# Patient Record
Sex: Male | Born: 1935 | Race: White | Hispanic: No | Smoking: Former smoker
Health system: Southern US, Community
[De-identification: ages and names within clinical notes are randomized; demographics above are authoritative.]

## PROBLEM LIST (undated history)

## (undated) DIAGNOSIS — E785 Hyperlipidemia, unspecified: Secondary | ICD-10-CM

## (undated) DIAGNOSIS — L980 Pyogenic granuloma: Secondary | ICD-10-CM

## (undated) DIAGNOSIS — H9190 Unspecified hearing loss, unspecified ear: Secondary | ICD-10-CM

## (undated) DIAGNOSIS — R897 Abnormal histological findings in specimens from other organs, systems and tissues: Secondary | ICD-10-CM

## (undated) DIAGNOSIS — Z8042 Family history of malignant neoplasm of prostate: Secondary | ICD-10-CM

## (undated) DIAGNOSIS — G4730 Sleep apnea, unspecified: Secondary | ICD-10-CM

## (undated) DIAGNOSIS — M549 Dorsalgia, unspecified: Secondary | ICD-10-CM

## (undated) DIAGNOSIS — E049 Nontoxic goiter, unspecified: Secondary | ICD-10-CM

## (undated) DIAGNOSIS — N400 Enlarged prostate without lower urinary tract symptoms: Secondary | ICD-10-CM

## (undated) DIAGNOSIS — Z9889 Other specified postprocedural states: Secondary | ICD-10-CM

## (undated) DIAGNOSIS — E669 Obesity, unspecified: Secondary | ICD-10-CM

## (undated) DIAGNOSIS — Z8041 Family history of malignant neoplasm of ovary: Secondary | ICD-10-CM

## (undated) DIAGNOSIS — H269 Unspecified cataract: Secondary | ICD-10-CM

## (undated) DIAGNOSIS — Z801 Family history of malignant neoplasm of trachea, bronchus and lung: Secondary | ICD-10-CM

## (undated) HISTORY — DX: Obesity, unspecified: E66.9

## (undated) HISTORY — DX: Dorsalgia, unspecified: M54.9

## (undated) HISTORY — PX: HERNIA REPAIR: SHX51

## (undated) HISTORY — DX: Family history of malignant neoplasm of ovary: Z80.41

## (undated) HISTORY — PX: VARICOSE VEIN SURGERY: SHX832

## (undated) HISTORY — PX: UMBILICAL HERNIA REPAIR: SHX196

## (undated) HISTORY — DX: Family history of malignant neoplasm of trachea, bronchus and lung: Z80.1

## (undated) HISTORY — DX: Benign prostatic hyperplasia without lower urinary tract symptoms: N40.0

## (undated) HISTORY — DX: Sleep apnea, unspecified: G47.30

## (undated) HISTORY — DX: Pyogenic granuloma: L98.0

## (undated) HISTORY — DX: Hyperlipidemia, unspecified: E78.5

## (undated) HISTORY — DX: Family history of malignant neoplasm of prostate: Z80.42

## (undated) HISTORY — DX: Nontoxic goiter, unspecified: E04.9

## (undated) HISTORY — DX: Abnormal histological findings in specimens from other organs, systems and tissues: R89.7

## (undated) HISTORY — DX: Other specified postprocedural states: Z98.890

## (undated) HISTORY — DX: Unspecified cataract: H26.9

---

## 2005-08-10 ENCOUNTER — Emergency Department (HOSPITAL_COMMUNITY): Admission: EM | Admit: 2005-08-10 | Discharge: 2005-08-10 | Payer: Self-pay | Admitting: Emergency Medicine

## 2005-09-03 DIAGNOSIS — Z9889 Other specified postprocedural states: Secondary | ICD-10-CM

## 2005-09-03 HISTORY — PX: COLONOSCOPY: SHX174

## 2005-09-03 HISTORY — DX: Other specified postprocedural states: Z98.890

## 2007-04-11 ENCOUNTER — Encounter (INDEPENDENT_AMBULATORY_CARE_PROVIDER_SITE_OTHER): Payer: Self-pay | Admitting: Family Medicine

## 2007-07-07 ENCOUNTER — Ambulatory Visit: Payer: Self-pay | Admitting: Family Medicine

## 2007-07-07 DIAGNOSIS — N400 Enlarged prostate without lower urinary tract symptoms: Secondary | ICD-10-CM

## 2007-07-07 DIAGNOSIS — H269 Unspecified cataract: Secondary | ICD-10-CM

## 2007-07-07 DIAGNOSIS — K5900 Constipation, unspecified: Secondary | ICD-10-CM | POA: Insufficient documentation

## 2007-07-08 ENCOUNTER — Encounter (INDEPENDENT_AMBULATORY_CARE_PROVIDER_SITE_OTHER): Payer: Self-pay | Admitting: Family Medicine

## 2007-07-30 ENCOUNTER — Encounter (INDEPENDENT_AMBULATORY_CARE_PROVIDER_SITE_OTHER): Payer: Self-pay | Admitting: Family Medicine

## 2007-08-08 ENCOUNTER — Ambulatory Visit: Payer: Self-pay | Admitting: Family Medicine

## 2007-08-08 ENCOUNTER — Telehealth (INDEPENDENT_AMBULATORY_CARE_PROVIDER_SITE_OTHER): Payer: Self-pay | Admitting: *Deleted

## 2007-08-08 DIAGNOSIS — L821 Other seborrheic keratosis: Secondary | ICD-10-CM

## 2007-08-08 DIAGNOSIS — E669 Obesity, unspecified: Secondary | ICD-10-CM

## 2007-08-09 ENCOUNTER — Encounter (INDEPENDENT_AMBULATORY_CARE_PROVIDER_SITE_OTHER): Payer: Self-pay | Admitting: Family Medicine

## 2007-08-09 LAB — CONVERTED CEMR LAB
Albumin: 4.3 g/dL (ref 3.5–5.2)
BUN: 15 mg/dL (ref 6–23)
CO2: 24 meq/L (ref 19–32)
Calcium: 9 mg/dL (ref 8.4–10.5)
Chloride: 105 meq/L (ref 96–112)
Eosinophils Absolute: 0.2 10*3/uL (ref 0.2–0.7)
Glucose, Bld: 98 mg/dL (ref 70–99)
HDL: 42 mg/dL (ref >39)
Lymphs Abs: 1.7 10*3/uL (ref 0.7–4.0)
MCV: 95.8 fL (ref 78.0–100.0)
Monocytes Relative: 14 % — ABNORMAL HIGH (ref 3–12)
Neutro Abs: 3.3 10*3/uL (ref 1.7–7.7)
Neutrophils Relative %: 53 % (ref 43–77)
Potassium: 5 meq/L (ref 3.5–5.3)
RBC: 4.8 M/uL (ref 4.22–5.81)
Triglycerides: 159 mg/dL — ABNORMAL HIGH (ref <150)
WBC: 6.1 10*3/uL (ref 4.0–10.5)

## 2007-08-11 ENCOUNTER — Telehealth (INDEPENDENT_AMBULATORY_CARE_PROVIDER_SITE_OTHER): Payer: Self-pay | Admitting: *Deleted

## 2007-08-24 ENCOUNTER — Ambulatory Visit: Admission: RE | Admit: 2007-08-24 | Discharge: 2007-08-24 | Payer: Self-pay | Admitting: Family Medicine

## 2007-08-24 ENCOUNTER — Encounter (INDEPENDENT_AMBULATORY_CARE_PROVIDER_SITE_OTHER): Payer: Self-pay | Admitting: Family Medicine

## 2007-09-03 ENCOUNTER — Ambulatory Visit: Payer: Self-pay | Admitting: Pulmonary Disease

## 2007-09-11 ENCOUNTER — Telehealth (INDEPENDENT_AMBULATORY_CARE_PROVIDER_SITE_OTHER): Payer: Self-pay | Admitting: *Deleted

## 2007-09-16 ENCOUNTER — Encounter (INDEPENDENT_AMBULATORY_CARE_PROVIDER_SITE_OTHER): Payer: Self-pay | Admitting: Family Medicine

## 2007-09-18 ENCOUNTER — Ambulatory Visit: Payer: Self-pay | Admitting: Family Medicine

## 2007-09-18 DIAGNOSIS — E785 Hyperlipidemia, unspecified: Secondary | ICD-10-CM

## 2007-09-18 LAB — CONVERTED CEMR LAB: HDL goal, serum: 40 mg/dL

## 2007-10-02 ENCOUNTER — Ambulatory Visit: Payer: Self-pay | Admitting: Family Medicine

## 2007-10-08 ENCOUNTER — Telehealth (INDEPENDENT_AMBULATORY_CARE_PROVIDER_SITE_OTHER): Payer: Self-pay | Admitting: *Deleted

## 2007-10-16 ENCOUNTER — Ambulatory Visit: Payer: Self-pay | Admitting: Family Medicine

## 2008-03-11 ENCOUNTER — Ambulatory Visit: Payer: Self-pay | Admitting: Family Medicine

## 2008-05-20 ENCOUNTER — Ambulatory Visit: Payer: Self-pay | Admitting: Family Medicine

## 2008-05-25 ENCOUNTER — Encounter (INDEPENDENT_AMBULATORY_CARE_PROVIDER_SITE_OTHER): Payer: Self-pay | Admitting: Family Medicine

## 2008-06-03 ENCOUNTER — Ambulatory Visit: Payer: Self-pay | Admitting: Family Medicine

## 2008-08-12 ENCOUNTER — Ambulatory Visit: Payer: Self-pay | Admitting: Family Medicine

## 2008-08-12 LAB — CONVERTED CEMR LAB

## 2008-08-13 LAB — CONVERTED CEMR LAB
ALT: 16 units/L (ref 0–53)
AST: 21 units/L (ref 0–37)
Albumin: 4.1 g/dL (ref 3.5–5.2)
Alkaline Phosphatase: 54 units/L (ref 39–117)
Calcium: 8.6 mg/dL (ref 8.4–10.5)
Chloride: 108 meq/L (ref 96–112)
Creatinine, Ser: 0.81 mg/dL (ref 0.40–1.50)
LDL Cholesterol: 82 mg/dL (ref 0–99)
Potassium: 4.6 meq/L (ref 3.5–5.3)
Total CHOL/HDL Ratio: 3.6

## 2008-11-03 ENCOUNTER — Encounter (INDEPENDENT_AMBULATORY_CARE_PROVIDER_SITE_OTHER): Payer: Self-pay | Admitting: Family Medicine

## 2009-02-07 ENCOUNTER — Ambulatory Visit: Payer: Self-pay | Admitting: Family Medicine

## 2009-02-07 DIAGNOSIS — D239 Other benign neoplasm of skin, unspecified: Secondary | ICD-10-CM | POA: Insufficient documentation

## 2009-02-07 DIAGNOSIS — H6690 Otitis media, unspecified, unspecified ear: Secondary | ICD-10-CM | POA: Insufficient documentation

## 2009-02-07 LAB — CONVERTED CEMR LAB
Cholesterol, target level: 200 mg/dL
LDL Goal: 130 mg/dL

## 2009-02-09 ENCOUNTER — Encounter (INDEPENDENT_AMBULATORY_CARE_PROVIDER_SITE_OTHER): Payer: Self-pay | Admitting: Family Medicine

## 2010-02-01 ENCOUNTER — Ambulatory Visit (HOSPITAL_COMMUNITY): Admission: RE | Admit: 2010-02-01 | Discharge: 2010-02-01 | Payer: Self-pay | Admitting: Internal Medicine

## 2010-02-01 DIAGNOSIS — Z9889 Other specified postprocedural states: Secondary | ICD-10-CM

## 2010-02-01 HISTORY — PX: COLONOSCOPY: SHX174

## 2010-02-01 HISTORY — DX: Other specified postprocedural states: Z98.890

## 2010-02-03 ENCOUNTER — Emergency Department (HOSPITAL_COMMUNITY): Admission: EM | Admit: 2010-02-03 | Discharge: 2010-02-03 | Payer: Self-pay | Admitting: Emergency Medicine

## 2010-02-15 ENCOUNTER — Ambulatory Visit: Payer: Self-pay | Admitting: Gastroenterology

## 2010-02-15 DIAGNOSIS — K921 Melena: Secondary | ICD-10-CM

## 2010-02-16 ENCOUNTER — Encounter: Payer: Self-pay | Admitting: Gastroenterology

## 2010-02-21 ENCOUNTER — Ambulatory Visit: Payer: Self-pay | Admitting: Gastroenterology

## 2010-02-21 ENCOUNTER — Ambulatory Visit (HOSPITAL_COMMUNITY): Admission: RE | Admit: 2010-02-21 | Discharge: 2010-02-21 | Payer: Self-pay | Admitting: Gastroenterology

## 2010-02-23 ENCOUNTER — Telehealth: Payer: Self-pay | Admitting: Gastroenterology

## 2010-05-17 ENCOUNTER — Encounter: Payer: Self-pay | Admitting: Gastroenterology

## 2010-05-25 ENCOUNTER — Ambulatory Visit: Payer: Self-pay | Admitting: Gastroenterology

## 2010-05-25 DIAGNOSIS — K2270 Barrett's esophagus without dysplasia: Secondary | ICD-10-CM

## 2010-05-31 ENCOUNTER — Encounter (INDEPENDENT_AMBULATORY_CARE_PROVIDER_SITE_OTHER): Payer: Self-pay

## 2010-05-31 LAB — CONVERTED CEMR LAB
Basophils Absolute: 0 10*3/uL (ref 0.0–0.1)
Basophils Relative: 0 % (ref 0–1)
Eosinophils Absolute: 0.3 10*3/uL (ref 0.0–0.7)
MCHC: 33.8 g/dL (ref 30.0–36.0)
Monocytes Relative: 12 % (ref 3–12)
Neutro Abs: 3.9 10*3/uL (ref 1.7–7.7)
Neutrophils Relative %: 53 % (ref 43–77)
Platelets: 184 10*3/uL (ref 150–400)
RBC: 4.65 M/uL (ref 4.22–5.81)

## 2010-10-03 NOTE — Assessment & Plan Note (Signed)
Summary: BRBPR   Visit Type:  Follow-up Visit Primary Care Provider:  Dwana Melena, M.D.   Chief Complaint:  rectal bleeding.  History of Present Illness: No rectal bleeding. Dr. Rito Ehrlich checked PSA. No other blood draws. No belly pain or black stool. Pt using [facility]. Needs [facility]MP rx faxed.  Current Medications (verified): 1)  Multivitamins  Caps (Multiple Vitamin) .... One Daily 2)  Finasteride 5 Mg Tabs (Finasteride) .... Once Daily 3)  Doxazosin Mesylate 4 Mg Tabs (Doxazosin Mesylate) .... Once Daily 4)  Prilosec 20 Mg Cpdr (Omeprazole) .Marland Kitchen.. 1 By Mouth Every Morning  Allergies (verified): 1)  ! Penicillin  Past History:  Past Surgical History: Last updated: 02/15/2010 1. Hernia repair / 1983 - left groin 2. Varicose veins , 1950s 3. Umbilical hernia repair  Past Medical History: **TCS/EGD JUNE 2011- Rectal bleeding most likely secondary to hemorrhoids Barrett esophagus. Mild gastritis and duodenitis. 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 Scharlene Gloss note, done at [facility]  Vital Signs:  Patient profile:   77 year old male Height:      70 inches Weight:      228 pounds BMI:     32.83 Temp:     97.5 degrees F oral Pulse rate:   68 / minute BP sitting:   150 / 80  (left arm) Cuff size:   regular  Vitals Entered By: Cloria Spring LPN (May 25, 2010 8:58 AM)  Physical Exam  General:  Well developed, well nourished, no acute distress. Head:  Normocephalic and atraumatic. Eyes:  PERRL, no icterus. Mouth:  No deformity or lesions. Neck:  Supple; no masses. Lungs:  Clear throughout to  auscultation. Heart:  Regular rate and rhythm; no murmurs. Abdomen:  Soft, nontender and nondistended.Normal bowel sounds.  Impression & Recommendations:  Problem # 1:  BLOOD IN STOOL (ICD-578.1) Assessment Improved  Sx improved. Recheck CBC. OPV in 12 mos.  CC: PCP  Orders: T-CBC w/Diff ([id number])  Problem # 2:  BARRETTS ESOPHAGUS (ICD-530.85) Assessment: Comment Only EGD IN 2012 Prescriptions: PRILOSEC 20 MG CPDR (OMEPRAZOLE) 1 by mouth every morning  #90 x 3   Entered and Authorized by:   West Bali MD   Signed by:   West Bali MD on 05/25/2010   Method used:   Printed then faxed to ...       [facility] [address] (retail)       [address]       [place], [place]  [address]       Ph: [phone]       Fax:    RxID:   [id number]   Appended Document: BRBPR F/U OPV IS IN THE COMPUTER  Appended Document: Orders Update    Clinical Lists Changes  Orders: Added new Service order of Est. Patient Level II ([address]) - Signed

## 2010-10-03 NOTE — Letter (Signed)
Summary: Normal Results Letter  [facility]  [address]   [place], [place] [address]   Phone: [phone]  Fax: [phone]    May 31, 2010  Jerry Macdonald [address] [place], [place]  [address] Sep 23, 1930   Dear [name],   Our office has been trying to contact you.  We just wanted to inform you that  your blood count was normal. Please call if you have questions, [phone].   Thank you,    Hendricks Limes, LPN Cloria Spring, LPN  [facility] Ph: [phone]   Fax: [phone]

## 2010-10-03 NOTE — Letter (Signed)
Summary: [facility]   Imported By: Rexene Alberts 05/17/2010 11:11:36  _____________________________________________________________________  External Attachment:    Type:   Image     Comment:   External Document

## 2010-10-03 NOTE — Letter (Signed)
Summary: PCP REFERRAL  PCP REFERRAL   Imported By: Ave Filter 02/16/2010 13:54:00  _____________________________________________________________________  External Attachment:    Type:   Image     Comment:   External Document

## 2010-10-03 NOTE — Progress Notes (Signed)
Summary: Prilosec       New/Updated Medications: PRILOSEC 20 MG CPDR (OMEPRAZOLE) 1 by mouth every morning Prescriptions: PRILOSEC 20 MG CPDR (OMEPRAZOLE) 1 by mouth every morning  #30 x 11   Entered and Authorized by:   West Bali MD   Signed by:   West Bali MD on 02/23/2010   Method used:   Telephoned to ...       [facility] [address] (retail)       [address]       [place], [place]  [address]       Ph: [phone]       Fax:    RxID:   [id number]   Appended Document: Prilosec rx called in

## 2010-10-03 NOTE — Letter (Signed)
Summary: TCS/EGD ORDER  TCS/EGD ORDER   Imported By: Ave Filter 02/15/2010 14:24:14  _____________________________________________________________________  External Attachment:    Type:   Image     Comment:   External Document

## 2010-10-03 NOTE — Assessment & Plan Note (Signed)
Summary: GI BLEED/SS   Visit Type:  Consult Referring Provider:  Dwana Melena Primary Care Provider:  Dwana Melena  Chief Complaint:  GI bleeding.  History of Present Illness: [name] is a pleasant 77 y/o WM, patient of Dr. Dwana Melena, who presents for further evaluation of GI bleeding. He first noted blood in his stool on June 2nd. Initially he had couple of stools which were maroon in color. He went to ED on  June 3rd,  stool dark red at that time. Lasted couple of days, each day less blood. Took 9 pills of Celebrex and had finished around time of bleeding started.  Also took Medrol dose pak. Stopped his Asa 81mg  on June 6th. He denies abd pain. No constipation or diarrhea. No n/v, heartburn, dysphagia, weight loss.  ED 02/03/10: WBC 13,100, H/H 13.9/40.2, PT/INR normal. DRE-->maroon stool. Labs 02/06/10: H/H 13.4/40.5, WBC 9800.  Current Medications (verified): 1)  Multivitamins  Caps (Multiple Vitamin) .... One Daily 2)  Omeprazole 20 Mg Cpdr (Omeprazole) .... Once Daily 3)  Finasteride 5 Mg Tabs (Finasteride) .... Once Daily 4)  Doxazosin Mesylate 4 Mg Tabs (Doxazosin Mesylate) .... Once Daily  Allergies (verified): 1)  ! 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 Scharlene Gloss note, done at [facility]  Past Surgical History: 1. Hernia repair / 1983 - left groin 2. Varicose veins , 1950s 3. Umbilical hernia repair  Family History: Father: deceased, 84, prostate ca Mother: deceased, 68, train wreck Brothers x 1: 84- Healthy except for smoker, Half brother age 34 - ? 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 Eyes:  Denies vision loss. ENT:  Denies nasal congestion, loss of smell, and difficulty swallowing. CV:  Denies chest pains, angina, palpitations, dyspnea on exertion, and peripheral edema. Resp:  Denies dyspnea at rest, dyspnea with exercise, cough, and sputum. GI:  See HPI. GU:  Denies urinary burning and blood in urine. MS:  Complains of joint pain / LOM and low back pain. Derm:  Denies rash and itching. Neuro:  Denies weakness, frequent headaches, memory loss, and confusion. Psych:  Denies depression and anxiety. Endo:  Denies unusual weight change. Heme:  Denies bruising and bleeding. Allergy:  Denies hives and rash.  Vital Signs:  Patient profile:   77 year old male Height:      70 inches Weight:      223 pounds BMI:     32.11 Temp:     97.9 degrees F oral Pulse rate:   68 / minute BP sitting:   118 / 82  (left arm) Cuff size:   regular  Vitals Entered By: Hendricks Limes LPN (February 15, 2010 11:28 AM)  Physical Exam  General:  Well developed, well nourished, no acute distress.obese.   Head:  Normocephalic and atraumatic. Eyes:  Conjunctivae pink, no scleral icterus.  Mouth:  Oropharyngeal mucosa moist, pink.  No lesions, erythema or  exudate.    Neck:  Supple; no masses or thyromegaly. Lungs:  Clear throughout to auscultation. Heart:  Regular rate and rhythm; no murmurs, rubs,  or bruits. Abdomen:  normal bowel sounds, obese, without guarding, without rebound, no hernia, no distesion, no tenderness, no masses, and no hepatomegally or splenomegaly.   Rectal:  deferred until time of colonoscopy.   Extremities:  No clubbing, cyanosis, edema  or deformities noted. Neurologic:  Alert and  oriented x4;  grossly normal neurologically. Skin:  Intact without significant lesions or rashes. Cervical Nodes:  No significant cervical adenopathy. Psych:  Alert and cooperative. Normal mood and affect.  Impression & Recommendations:  Problem # 1:  BLOOD IN STOOL (ICD-578.1)  Recent maroon stools, now resolved. In setting of NSAID/ASA use as outlined above. H/O diverticulosis. He may have developed diverticular bleed. Cannot exclude AVMs, malignancy, colitis. Doubt he had rapid transit upper gi bleed given hemodynamic stability.  Colonoscopy+/-EGD to be performed in near future.  Risks, alternatives, and benefits including but not limited to the risk of reaction to medication, bleeding, infection, and perforation were addressed.  Patient voiced understanding and provided verbal consent.   Continue PPI. Hold ASA for now. He should call if any further bleeding noted.   Orders: Consultation Level IV ([address]) I would like to thank Dr. Margo Aye for allowing [place] to take part in the care of this nice patient.

## 2010-11-20 LAB — URINALYSIS, ROUTINE W REFLEX MICROSCOPIC
Hgb urine dipstick: NEGATIVE
Nitrite: NEGATIVE
Protein, ur: NEGATIVE mg/dL
Specific Gravity, Urine: 1.02 (ref 1.005–1.030)
Urobilinogen, UA: 0.2 mg/dL (ref 0.0–1.0)

## 2010-11-20 LAB — DIFFERENTIAL
Basophils Absolute: 0 10*3/uL (ref 0.0–0.1)
Eosinophils Absolute: 0.1 10*3/uL (ref 0.0–0.7)
Eosinophils Relative: 1 % (ref 0–5)
Lymphocytes Relative: 15 % (ref 12–46)
Lymphs Abs: 2 10*3/uL (ref 0.7–4.0)
Monocytes Absolute: 1.3 10*3/uL — ABNORMAL HIGH (ref 0.1–1.0)

## 2010-11-20 LAB — COMPREHENSIVE METABOLIC PANEL
ALT: 22 U/L (ref 0–53)
AST: 22 U/L (ref 0–37)
Albumin: 3.5 g/dL (ref 3.5–5.2)
CO2: 28 mEq/L (ref 19–32)
Chloride: 102 mEq/L (ref 96–112)
GFR calc Af Amer: >60 mL/min (ref >60)
GFR calc non Af Amer: >60 mL/min (ref >60)
Potassium: 4.3 mEq/L (ref 3.5–5.1)
Sodium: 135 mEq/L (ref 135–145)
Total Bilirubin: 1.4 mg/dL — ABNORMAL HIGH (ref 0.3–1.2)

## 2010-11-20 LAB — CBC
Platelets: 226 10*3/uL (ref 150–400)
RBC: 4.17 MIL/uL — ABNORMAL LOW (ref 4.22–5.81)
WBC: 13.1 10*3/uL — ABNORMAL HIGH (ref 4.0–10.5)

## 2010-11-20 LAB — PROTIME-INR: Prothrombin Time: 13.8 seconds (ref 11.6–15.2)

## 2011-01-16 NOTE — Procedures (Signed)
NAMEBRYSON, Jerry Macdonald              ACCOUNT NO.:  [id number]   MEDICAL RECORD NO.:  [id number]          PATIENT TYPE:  OUT   LOCATION:  SLEEP LAB                     FACILITY:  [facility]   PHYSICIAN:  Barbaraann Share, MD,FCCPDATE OF BIRTH:  1933/02/04   DATE OF STUDY:  08/24/2007                            NOCTURNAL POLYSOMNOGRAM   REFERRING PHYSICIAN:  Franchot Heidelberg, M.D.   INDICATIONS FOR STUDY:  Hypersomnia with sleep apnea.   EPWORTH SLEEPINESS SCORE:  Four.   SLEEP ARCHITECTURE:  The patient had a total sleep time during the  entire night of 95 minutes with 91 events coming in the diagnostic  portion of the study and only 4 minutes spent sleeping during the  titration.  Sleep onset latency was normal as was REM onset.  Sleep  efficiency was very poor at less than 50%.  There is very little REM or  slow wave sleep during the entire night.   RESPIRATORY DATA:  The patient was found to have 56 obstructive events  in the first 91 minutes of sleep.  This gave him an apnea/hypopnea index  of 37 per hour during the diagnostic portion of the study.  Events were  not positional but clearly worse during REM.  There was very loud  snoring noted throughout.  By protocol the patient was then placed on a  CPAP device with a medium Respironics ComfortGel mask and was started on  5 cm of water pressure.  Unfortunately, the patient only slept for 4  minutes during the entire rest of the night, and therefore optimal CPAP  titration could not be reached.   OXYGEN DATA:  The patient had O2 desaturation as low as 72% with his  obstructive events.   CARDIAC DATA:  No clinically significant arrhythmias were noted  movement.   MOVEMENTS/PARASOMNIA:  None.   IMPRESSIONS/RECOMMENDATIONS:  Moderate to severe obstructive sleep apnea  with an apnea/hypopnea index of 37 events per hour and oxygen 2  desaturation as low as 72% during the diagnostic portion of the study.  The patient was then placed  on cpap by protocol with a medium  Respironics ComfortGel mask at 5 cm, however, he was unable to get any  more than 4 minutes of sleep the entire rest of the night.  Therefore,  he could not be optimized with regard to continuous positive airway  pressure.  Treatment for this degree of sleep apnea should focus  primarily  on weight loss as well as continuous positive airway pressure.  The  patient can return for formal continuous positive airway pressure  titration if this modality of treatment is chosen.      Barbaraann Share, MD,FCCP  Diplomate, American Board of Sleep  Medicine  Electronically Signed     [facility]  D:  09/05/2007 15:29:19  T:  09/05/2007 17:32:23  Job:  [id number]

## 2011-05-17 ENCOUNTER — Encounter: Payer: Self-pay | Admitting: Gastroenterology

## 2011-05-23 ENCOUNTER — Ambulatory Visit (INDEPENDENT_AMBULATORY_CARE_PROVIDER_SITE_OTHER): Payer: Medicare Other | Admitting: Gastroenterology

## 2011-05-23 ENCOUNTER — Encounter: Payer: Self-pay | Admitting: Gastroenterology

## 2011-05-23 DIAGNOSIS — K2270 Barrett's esophagus without dysplasia: Secondary | ICD-10-CM

## 2011-05-23 NOTE — Patient Instructions (Signed)
We have set you up for an endoscopy with Dr. Darrick Penna to see how your esophagus is doing due to your history of Barrett's.   Continue Prilosec daily!

## 2011-05-27 ENCOUNTER — Encounter: Payer: Self-pay | Admitting: Gastroenterology

## 2011-05-27 NOTE — Assessment & Plan Note (Signed)
77 year old male due for surveillance endoscopy with Dr. Darrick Penna. Last done in June 2011. Doing well without dysphagia, odynophagia, breakthrough reflux. Denies melena. Prilosec daily.   Proceed with upper endoscopy in the near future with Dr. Darrick Penna. The risks, benefits, and alternatives have been discussed in detail with patient. They have stated understanding and desire to proceed.  Continue Prilosec daily

## 2011-05-27 NOTE — Progress Notes (Signed)
Referring Provider: No ref. provider found Primary Care Physician:  Dwana Melena, MD Primary Gastroenterologist: Dr. Darrick Penna   Chief Complaint  Patient presents with  . Follow-up    HPI:   Mr. Stalvey is a pleasant 77 year old male with a history of Barrett's esophagus, due for updated surveillance this year. He reports reflux is controlled with Prilosec. Denies any dysphagia or odynophagia. Denies abdominal pain or rectal bleeding. Last EGD was in June 2011 with Dr. Darrick Penna. He has no complaints today. Wt up 2 lbs.   Past Medical History  Diagnosis Date  . Sleep apnea   . Pyogenic granuloma     left cheeck  . Back pain     with radiculopathy  . Hyperlipidemia   . Seborrheic keratosis   . Obesity   . Malaise and fatigue   . Cataracts, bilateral   . Constipation   . Hypertrophy of prostate   . S/P endoscopy June 2011    Barrett's, mild gastritis and duodenitis  . S/P colonoscopy June 2011    rectal bleeding secondary to hemorrhoids  . S/P colonoscopy 2007    [facility], diverticulosis per Dr. Scharlene Gloss note    Past Surgical History  Procedure Date  . Hernia repair   . Varicose vein surgery   . Umbilical hernia repair     Current Outpatient Prescriptions  Medication Sig Dispense Refill  . aspirin 81 MG tablet Take 81 mg by mouth daily.        Marland Kitchen doxazosin (CARDURA) 4 MG tablet Take 4 mg by mouth at bedtime.        . finasteride (PROSCAR) 5 MG tablet Take 5 mg by mouth daily.        . Multiple Vitamin (MULTIVITAMIN) capsule Take 1 capsule by mouth daily.        Marland Kitchen omeprazole (PRILOSEC) 20 MG capsule Take 20 mg by mouth daily.          Allergies as of 05/23/2011 - Review Complete 05/23/2011  Allergen Reaction Noted  . Penicillins  07/07/2007    No family history on file.  History   Social History  . Marital Status: Divorced    Spouse Name: N/A    Number of Children: N/A  . Years of Education: N/A   Social History Main Topics  . Smoking status: Former Smoker -- 0.5  packs/day    Types: Cigarettes  . Smokeless tobacco: None   Comment: quit in 1965  . Alcohol Use: No  . Drug Use: No  . Sexually Active: None   Other Topics Concern  . None   Social History Narrative  . None    Review of Systems: Gen: Denies fever, chills, anorexia. Denies fatigue, weakness, weight loss.  CV: Denies chest pain, palpitations, syncope, peripheral edema, and claudication. Resp: Denies dyspnea at rest, cough, wheezing, coughing up blood, and pleurisy. GI: Denies vomiting blood, jaundice, and fecal incontinence.   Denies dysphagia or odynophagia. Derm: Denies rash, itching, dry skin Psych: Denies depression, anxiety, memory loss, confusion. No homicidal or suicidal ideation.  Heme: Denies bruising, bleeding, and enlarged lymph nodes.  Physical Exam: BP 136/68  Pulse 79  Temp(Src) 98.2 F (36.8 C) (Temporal)  Ht 5\' 11"  (1.803 m)  Wt 230 lb 12.8 oz (104.69 kg)  BMI 32.19 kg/m2 General:   Alert and oriented. No distress noted. Pleasant and cooperative. Obese.  Head:  Normocephalic and atraumatic. Eyes:  Conjuctiva clear without scleral icterus. Mouth:  Oral mucosa pink and moist. No lesions. Neck:  Supple, without mass or thyromegaly. Heart:  S1, S2 present without murmurs, rubs, or gallops. Regular rate and rhythm. Abdomen:  +BS, soft, largely obese, protuberant. non-tender and non-distended. No rebound or guarding. No HSM or masses noted. Msk:  Symmetrical without gross deformities. Normal posture. Extremities:  Without edema. Neurologic:  Alert and  oriented x4;  grossly normal neurologically. Skin:  Intact without significant lesions or rashes. Cervical Nodes:  No significant cervical adenopathy. Psych:  Alert and cooperative. Normal mood and affect.

## 2011-05-28 NOTE — Progress Notes (Signed)
Cc to PCP 

## 2011-06-07 ENCOUNTER — Telehealth: Payer: Self-pay

## 2011-06-07 NOTE — Telephone Encounter (Signed)
Pt came by office, he needs refill on omperazole. He uses [facility] order[facility]. Needs #90.

## 2011-06-08 ENCOUNTER — Telehealth: Payer: Self-pay | Admitting: Gastroenterology

## 2011-06-08 MED ORDER — SODIUM CHLORIDE 0.45 % IV SOLN: Freq: Once | INTRAVENOUS | Status: DC

## 2011-06-08 MED ORDER — OMEPRAZOLE 20 MG PO CPDR: 20.0000 mg | DELAYED_RELEASE_CAPSULE | Freq: Every day | ORAL | Status: DC

## 2011-06-08 NOTE — Telephone Encounter (Signed)
Pt time moved due to case being added in the OR- I left message on pts answering machine advising him of the new arrival and procedure time

## 2011-06-11 ENCOUNTER — Encounter (HOSPITAL_COMMUNITY): Payer: Self-pay | Admitting: *Deleted

## 2011-06-11 ENCOUNTER — Encounter (HOSPITAL_COMMUNITY)
Admission: RE | Disposition: A | Discharge status: Home / Self Care | Payer: Self-pay | Source: Ambulatory Visit | Attending: Gastroenterology

## 2011-06-11 ENCOUNTER — Other Ambulatory Visit: Payer: Self-pay | Admitting: Gastroenterology

## 2011-06-11 ENCOUNTER — Ambulatory Visit (HOSPITAL_COMMUNITY)
Admission: RE | Admit: 2011-06-11 | Discharge: 2011-06-11 | Disposition: A | Discharge status: Home / Self Care | Payer: Medicare Other | Source: Ambulatory Visit | Attending: Gastroenterology | Admitting: Gastroenterology

## 2011-06-11 DIAGNOSIS — E785 Hyperlipidemia, unspecified: Secondary | ICD-10-CM | POA: Insufficient documentation

## 2011-06-11 DIAGNOSIS — K2990 Gastroduodenitis, unspecified, without bleeding: Secondary | ICD-10-CM

## 2011-06-11 DIAGNOSIS — K2900 Acute gastritis without bleeding: Secondary | ICD-10-CM | POA: Insufficient documentation

## 2011-06-11 DIAGNOSIS — K2980 Duodenitis without bleeding: Secondary | ICD-10-CM | POA: Insufficient documentation

## 2011-06-11 DIAGNOSIS — Z7982 Long term (current) use of aspirin: Secondary | ICD-10-CM | POA: Insufficient documentation

## 2011-06-11 DIAGNOSIS — K2270 Barrett's esophagus without dysplasia: Secondary | ICD-10-CM | POA: Insufficient documentation

## 2011-06-11 DIAGNOSIS — K2970 Gastritis, unspecified, without bleeding: Secondary | ICD-10-CM

## 2011-06-11 HISTORY — PX: ESOPHAGOGASTRODUODENOSCOPY: SHX5428

## 2011-06-11 SURGERY — EGD (ESOPHAGOGASTRODUODENOSCOPY)
Anesthesia: Moderate Sedation

## 2011-06-11 MED ORDER — MIDAZOLAM HCL 5 MG/5ML IJ SOLN
INTRAMUSCULAR | Status: DC | PRN
  Administered 2011-06-11 (×2): 2 mg via INTRAVENOUS

## 2011-06-11 MED ORDER — BUTAMBEN-TETRACAINE-BENZOCAINE 2-2-14 % EX AERO
INHALATION_SPRAY | CUTANEOUS | Status: DC | PRN
  Administered 2011-06-11: 1 via TOPICAL

## 2011-06-11 MED ORDER — FLUMAZENIL 0.5 MG/5ML IV SOLN
INTRAVENOUS | Status: AC
  Filled 2011-06-11: qty 5

## 2011-06-11 MED ORDER — MEPERIDINE HCL 100 MG/ML IJ SOLN
INTRAMUSCULAR | Status: AC
  Filled 2011-06-11: qty 2

## 2011-06-11 MED ORDER — MEPERIDINE HCL 100 MG/ML IJ SOLN
INTRAMUSCULAR | Status: DC | PRN
  Administered 2011-06-11 (×2): 25 mg via INTRAVENOUS

## 2011-06-11 MED ORDER — MIDAZOLAM HCL 5 MG/5ML IJ SOLN
INTRAMUSCULAR | Status: AC
  Filled 2011-06-11: qty 10

## 2011-06-11 MED ORDER — FLUMAZENIL 0.5 MG/5ML IV SOLN
INTRAVENOUS | Status: DC | PRN
  Administered 2011-06-11 (×2): .25 mg via INTRAVENOUS

## 2011-06-11 NOTE — Progress Notes (Signed)
REVIEWED. AGREE. 

## 2011-06-11 NOTE — Interval H&P Note (Signed)
History and Physical Interval Note:   06/11/2011   12:36 PM   Jerry Macdonald  has presented today for surgery, with the diagnosis of Barretts esophagus  The various methods of treatment have been discussed with the patient and family. After consideration of risks, benefits and other options for treatment, the patient has consented to  Procedure(s): ESOPHAGOGASTRODUODENOSCOPY (EGD) as a surgical intervention .  I have reviewed the patients' chart and labs.  Questions were answered to the patient's satisfaction.     [name]  MD       THE PATIENT WAS EXAMINED AND THERE IS NO CHANGE IN THE PATIENT'S CONDITION SINCE THE ORIGINAL H&P WAS COMPLETED.

## 2011-06-11 NOTE — H&P (Signed)
530.85      Reason for Visit     Follow-up        Vitals - Last Recorded       BP Pulse Temp(Src) Ht Wt BMI    136/68  79  98.2 F (36.8 C) (Temporal)  5\' 11"  (1.803 m)  230 lb 12.8 oz (104.69 kg)  32.19 kg/m2          Progress Notes     Jerry Halls, NP  05/27/2011 10:00 PM  Signed Referring Provider: No ref. provider found Primary Care Physician:  Dwana Melena, MD Primary Gastroenterologist: Dr. Darrick Penna     Chief Complaint   Patient presents with   .  Follow-up      HPI:    Jerry Macdonald is a pleasant 77 year old male with a history of Barrett's esophagus, due for updated surveillance this year. He reports reflux is controlled with Prilosec. Denies any dysphagia or odynophagia. Denies abdominal pain or rectal bleeding. Last EGD was in June 2011 with Dr. Darrick Penna. He has no complaints today. Wt up 2 lbs.     Past Medical History   Diagnosis  Date   .  Sleep apnea     .  Pyogenic granuloma         left cheeck   .  Back pain         with radiculopathy   .  Hyperlipidemia     .  Seborrheic keratosis     .  Obesity     .  Malaise and fatigue     .  Cataracts, bilateral     .  Constipation     .  Hypertrophy of prostate     .  S/P endoscopy  June 2011       Barrett's, mild gastritis and duodenitis   .  S/P colonoscopy  June 2011       rectal bleeding secondary to hemorrhoids   .  S/P colonoscopy  2007       [facility], diverticulosis per Dr. Scharlene Gloss note       Past Surgical History   Procedure  Date   .  Hernia repair     .  Varicose vein surgery     .  Umbilical hernia repair         Current Outpatient Prescriptions   Medication  Sig  Dispense  Refill   .  aspirin 81 MG tablet  Take 81 mg by mouth daily.           Marland Kitchen  doxazosin (CARDURA) 4 MG tablet  Take 4 mg by mouth at bedtime.           .  finasteride (PROSCAR) 5 MG tablet  Take 5 mg by mouth daily.           .  Multiple Vitamin (MULTIVITAMIN) capsule  Take 1 capsule by mouth daily.           Marland Kitchen  omeprazole  (PRILOSEC) 20 MG capsule  Take 20 mg by mouth daily.               Allergies as of 05/23/2011 - Review Complete 05/23/2011   Allergen  Reaction  Noted   .  Penicillins    07/07/2007      No family history on file.    History       Social History   .  Marital Status:  Divorced       Spouse Name:  N/A       Number of Children:  N/A   .  Years of Education:  N/A       Social History Main Topics   .  Smoking status:  Former Smoker -- 0.5 packs/day       Types:  Cigarettes   .  Smokeless tobacco:  None     Comment: quit in 1965   .  Alcohol Use:  No   .  Drug Use:  No   .  Sexually Active:  None       Other Topics  Concern   .  None       Social History Narrative   .  None      Review of Systems: Gen: Denies fever, chills, anorexia. Denies fatigue, weakness, weight loss.   CV: Denies chest pain, palpitations, syncope, peripheral edema, and claudication. Resp: Denies dyspnea at rest, cough, wheezing, coughing up blood, and pleurisy. GI: Denies vomiting blood, jaundice, and fecal incontinence.   Denies dysphagia or odynophagia. Derm: Denies rash, itching, dry skin Psych: Denies depression, anxiety, memory loss, confusion. No homicidal or suicidal ideation.   Heme: Denies bruising, bleeding, and enlarged lymph nodes.   Physical Exam: BP 136/68  Pulse 79  Temp(Src) 98.2 F (36.8 C) (Temporal)  Ht 5\' 11"  (1.803 m)  Wt 230 lb 12.8 oz (104.69 kg)  BMI 32.19 kg/m2 General:   Alert and oriented. No distress noted. Pleasant and cooperative. Obese.   Head:  Normocephalic and atraumatic. Eyes:  Conjuctiva clear without scleral icterus. Mouth:  Oral mucosa pink and moist. No lesions. Neck:  Supple, without mass or thyromegaly. Heart:  S1, S2 present without murmurs, rubs, or gallops. Regular rate and rhythm. Abdomen:  +BS, soft, largely obese, protuberant. non-tender and non-distended. No rebound or guarding. No HSM or masses noted. Msk:  Symmetrical without gross  deformities. Normal posture. Extremities:  Without edema. Neurologic:  Alert and  oriented x4;  grossly normal neurologically. Skin:  Intact without significant lesions or rashes. Cervical Nodes:  No significant cervical adenopathy. Psych:  Alert and cooperative. Normal mood and affect.     Glendora Score  05/28/2011  3:46 PM  Signed Cc to PCP        BARRETTS ESOPHAGUS - Jerry Halls, NP  05/27/2011  9:59 PM  Signed 77 year old male due for surveillance endoscopy with Dr. Darrick Penna. Last done in June 2011. Doing well without dysphagia, odynophagia, breakthrough reflux. Denies melena. Prilosec daily.    Proceed with upper endoscopy in the near future with Dr. Darrick Penna. The risks, benefits, and alternatives have been discussed in detail with patient. They have stated understanding and desire to proceed.  Continue Prilosec daily

## 2011-06-15 ENCOUNTER — Telehealth: Payer: Self-pay | Admitting: Gastroenterology

## 2011-06-15 NOTE — Telephone Encounter (Signed)
Leonides Grills (friend) was calling for Pt. She said he was having some lower gi bleeding and wanted to speak with SF or nurse about this since he had a procedure recently. Please call pt at [phone]

## 2011-06-15 NOTE — Telephone Encounter (Signed)
Called pt. Got VM. LMOM if he is having bleeding to go to the ED.

## 2011-06-18 ENCOUNTER — Telehealth: Payer: Self-pay | Admitting: Gastroenterology

## 2011-06-18 ENCOUNTER — Other Ambulatory Visit: Payer: Self-pay | Admitting: Gastroenterology

## 2011-06-18 ENCOUNTER — Other Ambulatory Visit: Payer: Self-pay

## 2011-06-18 DIAGNOSIS — K625 Hemorrhage of anus and rectum: Secondary | ICD-10-CM

## 2011-06-18 NOTE — Telephone Encounter (Signed)
Needs CBC today. Monitor for any further signs of bleeding. Has hx of internal hemorrhoids on colonoscopy June 2011 (which would likely explain brbpr). Watch for any further evidence of dark, tarry stools. Call us immediately if further bleeding.

## 2011-06-18 NOTE — Telephone Encounter (Signed)
Lab order faxed to [facility] and confirmed received.

## 2011-06-18 NOTE — Telephone Encounter (Signed)
Called and spoke with Harriett Sine. She said he passed some black tarry clotted blood just once on Friday and when he wiped he got bright red blood on tissue. He has not seen any more. Please advise!

## 2011-06-18 NOTE — Telephone Encounter (Signed)
Pt informed. York Spaniel he will go to the lab now.

## 2011-06-18 NOTE — Telephone Encounter (Signed)
REVIEWED. AGREE. 

## 2011-06-18 NOTE — Telephone Encounter (Signed)
Please call pt. The biopsies show Barrett's. Repeat EGD in 3 years.   I did several biopsies and he may see a small amount of black stool from bleeding from the biopsy sites. Continue to monitor. The bright red blood is most likely 2o to hemorrhoids. He should use Prep H and soften stool with Colace 100 mg bid for 2 weeks. Go to ED for large amounts of BRBRP. Otherwise call if Sx not resolved in 2 weeks.

## 2011-06-18 NOTE — Telephone Encounter (Signed)
Pt informed

## 2011-06-19 ENCOUNTER — Encounter (HOSPITAL_COMMUNITY): Payer: Self-pay | Admitting: Gastroenterology

## 2011-06-19 LAB — CBC WITH DIFFERENTIAL/PLATELET
Eosinophils Absolute: 0.3 10*3/uL (ref 0.0–0.7)
Eosinophils Relative: 5 % (ref 0–5)
HCT: 39.1 % (ref 39.0–52.0)
Lymphocytes Relative: 37 % (ref 12–46)
Lymphs Abs: 2.3 10*3/uL (ref 0.7–4.0)
MCH: 32.1 pg (ref 26.0–34.0)
MCV: 97.3 fL (ref 78.0–100.0)
Monocytes Absolute: 0.8 10*3/uL (ref 0.1–1.0)
Platelets: 208 10*3/uL (ref 150–400)
RBC: 4.02 MIL/uL — ABNORMAL LOW (ref 4.22–5.81)
WBC: 6.3 10*3/uL (ref 4.0–10.5)

## 2011-06-19 NOTE — Progress Notes (Signed)
Quick Note:  Pt informed ______ 

## 2011-06-19 NOTE — Progress Notes (Signed)
Quick Note:  Noted. Very small normocytic anemia, just below normal. Not a concern. Continue as per Dr. Evelina Dun plan. ______

## 2011-06-20 NOTE — Telephone Encounter (Signed)
Reminder in epic to repeat EGD in 3 years

## 2011-07-02 ENCOUNTER — Ambulatory Visit: Payer: Medicare Other | Admitting: Urgent Care

## 2011-07-02 NOTE — Telephone Encounter (Signed)
Results Cc to PCP  

## 2011-07-19 ENCOUNTER — Ambulatory Visit: Payer: Medicare Other | Attending: Internal Medicine | Admitting: Sleep Medicine

## 2011-07-19 DIAGNOSIS — G4733 Obstructive sleep apnea (adult) (pediatric): Secondary | ICD-10-CM | POA: Insufficient documentation

## 2011-07-19 DIAGNOSIS — Z6833 Body mass index (BMI) 33.0-33.9, adult: Secondary | ICD-10-CM | POA: Insufficient documentation

## 2011-07-19 DIAGNOSIS — G4730 Sleep apnea, unspecified: Secondary | ICD-10-CM

## 2011-08-02 NOTE — Procedures (Signed)
NAMEMYCAL, CONDE              ACCOUNT NO.:  [id number]  MEDICAL RECORD NO.:  [id number]          PATIENT TYPE:  OUT  LOCATION:  SLEEP LAB                     FACILITY:  [facility]  PHYSICIAN:  Cuca Benassi A. Gerilyn Pilgrim, M.D. DATE OF BIRTH:  November 23, 1930  DATE OF STUDY:  07/19/2011                           NOCTURNAL POLYSOMNOGRAM  REFERRING PHYSICIAN:  ZACK HALL  INDICATION FOR STUDY:  A 77 year old man who has had a previous study 3 years ago showing significant apnea.  This is a CPAP titration recording.  EPWORTH SLEEPINESS SCORE:  5.  BMI:  33.  MEDICATIONS:  Finasteride, omeprazole, doxazosin.  SLEEP ARCHITECTURE:  This is a full CPAP titration recording.  The total recording time is 490 minutes.  Sleep efficiency 68%.  Sleep latency 20 minutes.  REM latency 52 minutes.  Stage N1 of 8%, N2 of 62%, N3 of 1%, and REM sleep 30%.  Risk factors; baseline oxygen saturation 93, lowest saturation 85 during REM sleep.  The patient was started on pressures of 5 and titrated to a pressure of 11.  He did have some difficulties tolerating higher pressures.  Optimal pressure 10.   CARDIAC DATA:  Average heart rate is 61.  There were some PVCs noted.  MOVEMENT-PARASOMNIA:  PLM index is 35.  IMPRESSIONS-RECOMMENDATIONS: 1. Obstructive sleep apnea syndrome which responds well to CPAP level     at 10. 2. Moderately severe periodic limb movement disorder sleep. 3. Premature ventricular contractions.  CPAP of 10.  Also consider treatment of leg movements with Requip or Mirapex.     Spero Gunnels A. Gerilyn Pilgrim, M.D.    KAD/MEDQ  D:  08/02/2011 09:20:07  T:  08/02/2011 09:52:20  Job:  [id number]

## 2011-10-12 DIAGNOSIS — M25519 Pain in unspecified shoulder: Secondary | ICD-10-CM | POA: Diagnosis not present

## 2011-10-26 DIAGNOSIS — M25519 Pain in unspecified shoulder: Secondary | ICD-10-CM | POA: Diagnosis not present

## 2011-11-15 DIAGNOSIS — R972 Elevated prostate specific antigen [PSA]: Secondary | ICD-10-CM | POA: Diagnosis not present

## 2011-11-23 DIAGNOSIS — N401 Enlarged prostate with lower urinary tract symptoms: Secondary | ICD-10-CM | POA: Diagnosis not present

## 2011-11-23 DIAGNOSIS — R972 Elevated prostate specific antigen [PSA]: Secondary | ICD-10-CM | POA: Diagnosis not present

## 2012-03-10 DIAGNOSIS — G4700 Insomnia, unspecified: Secondary | ICD-10-CM | POA: Diagnosis not present

## 2012-03-10 DIAGNOSIS — K219 Gastro-esophageal reflux disease without esophagitis: Secondary | ICD-10-CM | POA: Diagnosis not present

## 2012-03-10 DIAGNOSIS — E669 Obesity, unspecified: Secondary | ICD-10-CM | POA: Diagnosis not present

## 2012-03-10 DIAGNOSIS — G4730 Sleep apnea, unspecified: Secondary | ICD-10-CM | POA: Diagnosis not present

## 2012-05-19 DIAGNOSIS — N401 Enlarged prostate with lower urinary tract symptoms: Secondary | ICD-10-CM | POA: Diagnosis not present

## 2012-05-26 DIAGNOSIS — R972 Elevated prostate specific antigen [PSA]: Secondary | ICD-10-CM | POA: Diagnosis not present

## 2012-05-26 DIAGNOSIS — N401 Enlarged prostate with lower urinary tract symptoms: Secondary | ICD-10-CM | POA: Diagnosis not present

## 2012-06-05 DIAGNOSIS — Z23 Encounter for immunization: Secondary | ICD-10-CM | POA: Diagnosis not present

## 2012-06-12 DIAGNOSIS — Z23 Encounter for immunization: Secondary | ICD-10-CM | POA: Diagnosis not present

## 2012-07-14 DIAGNOSIS — G4730 Sleep apnea, unspecified: Secondary | ICD-10-CM | POA: Diagnosis not present

## 2012-11-14 DIAGNOSIS — G4730 Sleep apnea, unspecified: Secondary | ICD-10-CM | POA: Diagnosis not present

## 2012-11-14 DIAGNOSIS — N400 Enlarged prostate without lower urinary tract symptoms: Secondary | ICD-10-CM | POA: Diagnosis not present

## 2012-11-17 DIAGNOSIS — E785 Hyperlipidemia, unspecified: Secondary | ICD-10-CM | POA: Diagnosis not present

## 2012-12-15 DIAGNOSIS — R972 Elevated prostate specific antigen [PSA]: Secondary | ICD-10-CM | POA: Diagnosis not present

## 2012-12-18 DIAGNOSIS — R972 Elevated prostate specific antigen [PSA]: Secondary | ICD-10-CM | POA: Diagnosis not present

## 2013-02-18 ENCOUNTER — Other Ambulatory Visit: Payer: Self-pay | Admitting: Gastroenterology

## 2013-03-09 DIAGNOSIS — N400 Enlarged prostate without lower urinary tract symptoms: Secondary | ICD-10-CM | POA: Diagnosis not present

## 2013-03-09 DIAGNOSIS — G4700 Insomnia, unspecified: Secondary | ICD-10-CM | POA: Diagnosis not present

## 2013-03-09 DIAGNOSIS — G4730 Sleep apnea, unspecified: Secondary | ICD-10-CM | POA: Diagnosis not present

## 2013-03-10 ENCOUNTER — Emergency Department (HOSPITAL_COMMUNITY)
Admission: EM | Admit: 2013-03-10 | Discharge: 2013-03-10 | Disposition: A | Discharge status: Home / Self Care | Payer: Medicare Other | Attending: Emergency Medicine | Admitting: Emergency Medicine

## 2013-03-10 ENCOUNTER — Encounter (HOSPITAL_COMMUNITY): Payer: Self-pay

## 2013-03-10 DIAGNOSIS — Z862 Personal history of diseases of the blood and blood-forming organs and certain disorders involving the immune mechanism: Secondary | ICD-10-CM | POA: Diagnosis not present

## 2013-03-10 DIAGNOSIS — Z8639 Personal history of other endocrine, nutritional and metabolic disease: Secondary | ICD-10-CM | POA: Insufficient documentation

## 2013-03-10 DIAGNOSIS — Z872 Personal history of diseases of the skin and subcutaneous tissue: Secondary | ICD-10-CM | POA: Insufficient documentation

## 2013-03-10 DIAGNOSIS — H7290 Unspecified perforation of tympanic membrane, unspecified ear: Secondary | ICD-10-CM | POA: Insufficient documentation

## 2013-03-10 DIAGNOSIS — Z79899 Other long term (current) drug therapy: Secondary | ICD-10-CM | POA: Insufficient documentation

## 2013-03-10 DIAGNOSIS — Z8739 Personal history of other diseases of the musculoskeletal system and connective tissue: Secondary | ICD-10-CM | POA: Insufficient documentation

## 2013-03-10 DIAGNOSIS — Z8719 Personal history of other diseases of the digestive system: Secondary | ICD-10-CM | POA: Diagnosis not present

## 2013-03-10 DIAGNOSIS — Z8669 Personal history of other diseases of the nervous system and sense organs: Secondary | ICD-10-CM | POA: Diagnosis not present

## 2013-03-10 DIAGNOSIS — Z7982 Long term (current) use of aspirin: Secondary | ICD-10-CM | POA: Insufficient documentation

## 2013-03-10 DIAGNOSIS — H7291 Unspecified perforation of tympanic membrane, right ear: Secondary | ICD-10-CM

## 2013-03-10 DIAGNOSIS — H9210 Otorrhea, unspecified ear: Secondary | ICD-10-CM | POA: Insufficient documentation

## 2013-03-10 DIAGNOSIS — H9209 Otalgia, unspecified ear: Secondary | ICD-10-CM | POA: Diagnosis not present

## 2013-03-10 DIAGNOSIS — Z880 Allergy status to penicillin: Secondary | ICD-10-CM | POA: Insufficient documentation

## 2013-03-10 DIAGNOSIS — E669 Obesity, unspecified: Secondary | ICD-10-CM | POA: Diagnosis not present

## 2013-03-10 DIAGNOSIS — Z87891 Personal history of nicotine dependence: Secondary | ICD-10-CM | POA: Diagnosis not present

## 2013-03-10 DIAGNOSIS — N400 Enlarged prostate without lower urinary tract symptoms: Secondary | ICD-10-CM | POA: Diagnosis not present

## 2013-03-10 DIAGNOSIS — H66019 Acute suppurative otitis media with spontaneous rupture of ear drum, unspecified ear: Secondary | ICD-10-CM | POA: Diagnosis not present

## 2013-03-10 DIAGNOSIS — H903 Sensorineural hearing loss, bilateral: Secondary | ICD-10-CM | POA: Diagnosis not present

## 2013-03-10 NOTE — ED Provider Notes (Signed)
History    CSN: [id number] Arrival date & time 03/10/13  0544  First MD Initiated Contact with Patient 03/10/13 [phone]     Chief Complaint  Patient presents with  . Ear Drainage  . Otalgia   (Consider location/radiation/quality/duration/timing/severity/associated sxs/prior Treatment) HPI Comments: 79 year old male with a history of sleep apnea, obesity, who is on a baby aspirin presents with a complaint of right ear bleeding. The patient states that he woke up at 3:30 AM with acute onset of right ear pain followed by a small amount of bleeding coming from his right ear. He denies cleaning his ears, he does not put any Q-tips or anything else inside his ears and has had no increased nasal congestion, no sore throat, no sinus drainage, no blood in his mouth or nose. The pain in his ear has gradually improved and now is almost completely gone however the bleeding is still present.  Patient is a 79 y.o. male presenting with ear drainage and ear pain. The history is provided by the patient.  Ear Drainage  Otalgia Associated symptoms: no congestion, no fever, no rhinorrhea, no sore throat, no tinnitus and no vomiting    Past Medical History  Diagnosis Date  . Sleep apnea   . Pyogenic granuloma     left cheeck  . Back pain     with radiculopathy  . Hyperlipidemia   . Seborrheic keratosis   . Obesity   . Malaise and fatigue   . Cataracts, bilateral   . Constipation   . Hypertrophy of prostate   . S/P endoscopy June 2011    Barrett's, mild gastritis and duodenitis  . S/P colonoscopy June 2011    rectal bleeding secondary to hemorrhoids  . S/P colonoscopy 2007    [facility], diverticulosis per Dr. Scharlene Gloss note   Past Surgical History  Procedure Laterality Date  . Hernia repair    . Varicose vein surgery    . Umbilical hernia repair    . Esophagogastroduodenoscopy  06/11/2011    Procedure: ESOPHAGOGASTRODUODENOSCOPY (EGD);  Surgeon: Arlyce Harman, MD;  Location: [facility];   Service: Endoscopy;  Laterality: N/A;  11:00   No family history on file. History  Substance Use Topics  . Smoking status: Former Smoker -- 0.50 packs/day    Types: Cigarettes  . Smokeless tobacco: Not on file     Comment: quit in 1965  . Alcohol Use: No    Review of Systems  Constitutional: Negative for fever.  HENT: Positive for ear pain. Negative for nosebleeds, congestion, sore throat, rhinorrhea, trouble swallowing, voice change, postnasal drip, sinus pressure and tinnitus.   Gastrointestinal: Negative for nausea and vomiting.    Allergies  Penicillins  Home Medications   Current Outpatient Rx  Name  Route  Sig  Dispense  Refill  . aspirin 81 MG tablet   Oral   Take 81 mg by mouth daily.           Marland Kitchen doxazosin (CARDURA) 4 MG tablet   Oral   Take 4 mg by mouth at bedtime.           . Multiple Vitamin (MULTIVITAMIN) capsule   Oral   Take 1 capsule by mouth daily.           Marland Kitchen omeprazole (PRILOSEC) 20 MG capsule      TAKE 1 CAPSULE DAILY   90 capsule   3   . aspirin EC 81 MG tablet   Oral   Take 81  mg by mouth daily.           . finasteride (PROSCAR) 5 MG tablet   Oral   Take 5 mg by mouth daily.           . Multiple Vitamins-Minerals (MULTIVITAMINS THER. W/MINERALS) TABS   Oral   Take 1 tablet by mouth daily.            BP 142/78  Pulse 78  Temp(Src) 97.8 F (36.6 C) (Oral)  Resp 16  Ht 5\' 10"  (1.778 m)  Wt 210 lb (95.255 kg)  BMI 30.13 kg/m2  SpO2 92% Physical Exam  Constitutional: He appears well-developed and well-nourished. No distress.  HENT:  Head: Normocephalic and atraumatic.  Mouth/Throat: Oropharynx is clear and moist.  Left tympanic membrane normal, right tympanic membrane appears hemorrhagic, small amount of blood in the external auditory canal  Eyes: Conjunctivae are normal. Right eye exhibits no discharge. Left eye exhibits no discharge. No scleral icterus.  Neck: Normal range of motion. Neck supple.  Cardiovascular:  Normal rate.   No murmur heard. Pulmonary/Chest: Effort normal and breath sounds normal. No respiratory distress.  Neurological: He is alert. Coordination normal.  Speech is clear, gait is normal and steady, bilateral upper and lower extremities with normal sensation to light touch, normal strength  Skin: Skin is warm and dry. No rash noted. He is not diaphoretic.    ED Course  Procedures (including critical care time) Labs Reviewed - No data to display No results found. 1. Ruptured tympanic membrane, right     MDM  Physical exam shows that the patient has a hemotympanum on the right, he has had no head trauma, has no headache, and with a history of acute onset of pain and I think blood in the external auditory canal I suspect he had a perforated tympanic membrane. The etiology of the bleeding is unclear however the patient appears stable to followup with ear nose and throat doctor who is on call. Followup information given, patient cautioned not to put anything in his ear.  Vida Roller, MD 03/10/13 [phone]

## 2013-03-10 NOTE — ED Notes (Addendum)
Patient complaining of right ear ache and bleeding from same ear. Started around 0400 this morning per pt.

## 2013-03-26 ENCOUNTER — Ambulatory Visit (INDEPENDENT_AMBULATORY_CARE_PROVIDER_SITE_OTHER): Payer: Medicare Other | Admitting: Otolaryngology

## 2013-03-26 DIAGNOSIS — H903 Sensorineural hearing loss, bilateral: Secondary | ICD-10-CM

## 2013-06-08 DIAGNOSIS — Z23 Encounter for immunization: Secondary | ICD-10-CM | POA: Diagnosis not present

## 2013-09-24 ENCOUNTER — Ambulatory Visit (INDEPENDENT_AMBULATORY_CARE_PROVIDER_SITE_OTHER): Payer: Medicare Other | Admitting: Otolaryngology

## 2013-09-24 DIAGNOSIS — H903 Sensorineural hearing loss, bilateral: Secondary | ICD-10-CM | POA: Diagnosis not present

## 2013-11-30 DIAGNOSIS — R972 Elevated prostate specific antigen [PSA]: Secondary | ICD-10-CM | POA: Diagnosis not present

## 2013-12-17 DIAGNOSIS — N400 Enlarged prostate without lower urinary tract symptoms: Secondary | ICD-10-CM | POA: Diagnosis not present

## 2014-01-08 ENCOUNTER — Other Ambulatory Visit: Payer: Self-pay | Admitting: Gastroenterology

## 2014-01-08 NOTE — Telephone Encounter (Signed)
Needs OV in 3 months. Will arrange for surveillance EGD at that time for h/o Barretts.

## 2014-01-11 NOTE — Telephone Encounter (Signed)
Reminder in epic °

## 2014-02-26 DIAGNOSIS — H2510 Age-related nuclear cataract, unspecified eye: Secondary | ICD-10-CM | POA: Diagnosis not present

## 2014-02-26 DIAGNOSIS — H43819 Vitreous degeneration, unspecified eye: Secondary | ICD-10-CM | POA: Diagnosis not present

## 2014-02-26 DIAGNOSIS — H2589 Other age-related cataract: Secondary | ICD-10-CM | POA: Diagnosis not present

## 2014-02-26 DIAGNOSIS — H04129 Dry eye syndrome of unspecified lacrimal gland: Secondary | ICD-10-CM | POA: Diagnosis not present

## 2014-02-26 DIAGNOSIS — H02839 Dermatochalasis of unspecified eye, unspecified eyelid: Secondary | ICD-10-CM | POA: Diagnosis not present

## 2014-03-21 ENCOUNTER — Other Ambulatory Visit: Payer: Self-pay | Admitting: Gastroenterology

## 2014-03-22 NOTE — Telephone Encounter (Signed)
Needs 2 yr f/u OV of GERD 05/2014. Refills provided to last until then.

## 2014-03-22 NOTE — Telephone Encounter (Signed)
I called and LMOM that the RX has been sent in for 90 days and he does need an appt. Routing to Manuela Schwartz to Schedule that.

## 2014-03-23 ENCOUNTER — Encounter: Payer: Self-pay | Admitting: Gastroenterology

## 2014-03-23 NOTE — Telephone Encounter (Signed)
Pt is aware of OV on 8/20 at 0930 with SF and appt card mailed

## 2014-03-25 ENCOUNTER — Ambulatory Visit (INDEPENDENT_AMBULATORY_CARE_PROVIDER_SITE_OTHER): Payer: Medicare Other | Admitting: Otolaryngology

## 2014-03-25 DIAGNOSIS — H6120 Impacted cerumen, unspecified ear: Secondary | ICD-10-CM

## 2014-04-22 ENCOUNTER — Ambulatory Visit: Payer: Medicare Other | Admitting: Gastroenterology

## 2014-04-29 ENCOUNTER — Other Ambulatory Visit: Payer: Self-pay | Admitting: Gastroenterology

## 2014-04-29 ENCOUNTER — Encounter: Payer: Self-pay | Admitting: Gastroenterology

## 2014-04-29 ENCOUNTER — Ambulatory Visit (INDEPENDENT_AMBULATORY_CARE_PROVIDER_SITE_OTHER): Payer: Medicare Other | Admitting: Gastroenterology

## 2014-04-29 ENCOUNTER — Encounter (INDEPENDENT_AMBULATORY_CARE_PROVIDER_SITE_OTHER): Payer: Self-pay

## 2014-04-29 VITALS — BP 128/72 | HR 79 | Temp 98.0°F | Ht 71.0 in | Wt 230.4 lb

## 2014-04-29 DIAGNOSIS — K2270 Barrett's esophagus without dysplasia: Secondary | ICD-10-CM

## 2014-04-29 NOTE — Progress Notes (Signed)
Cc to pcp °
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
## 2014-04-29 NOTE — Patient Instructions (Signed)
I DID NOT HAVE ANY APPTS ON THUR BUT I CAN GET YOU DONE FRI SEP 18.  SEE IN A COUPLE OF WEEKS.

## 2014-04-29 NOTE — Assessment & Plan Note (Signed)
DUE FOR SURVEILLANCE.  EGD FRI SEP 18 FOLLOW UP PRN

## 2014-05-11 ENCOUNTER — Encounter (HOSPITAL_COMMUNITY): Payer: Self-pay | Admitting: Pharmacy Technician

## 2014-05-12 ENCOUNTER — Encounter: Payer: Self-pay | Admitting: Gastroenterology

## 2014-05-21 ENCOUNTER — Encounter (HOSPITAL_COMMUNITY)
Admission: RE | Disposition: A | Discharge status: Home / Self Care | Payer: Self-pay | Source: Ambulatory Visit | Attending: Gastroenterology

## 2014-05-21 ENCOUNTER — Ambulatory Visit (HOSPITAL_COMMUNITY)
Admission: RE | Admit: 2014-05-21 | Discharge: 2014-05-21 | Disposition: A | Discharge status: Home / Self Care | Payer: Medicare Other | Source: Ambulatory Visit | Attending: Gastroenterology | Admitting: Gastroenterology

## 2014-05-21 ENCOUNTER — Encounter (HOSPITAL_COMMUNITY): Payer: Self-pay

## 2014-05-21 DIAGNOSIS — Z87891 Personal history of nicotine dependence: Secondary | ICD-10-CM | POA: Diagnosis not present

## 2014-05-21 DIAGNOSIS — Z6832 Body mass index (BMI) 32.0-32.9, adult: Secondary | ICD-10-CM | POA: Insufficient documentation

## 2014-05-21 DIAGNOSIS — E785 Hyperlipidemia, unspecified: Secondary | ICD-10-CM | POA: Insufficient documentation

## 2014-05-21 DIAGNOSIS — E669 Obesity, unspecified: Secondary | ICD-10-CM | POA: Insufficient documentation

## 2014-05-21 DIAGNOSIS — Z7982 Long term (current) use of aspirin: Secondary | ICD-10-CM | POA: Diagnosis not present

## 2014-05-21 DIAGNOSIS — K2270 Barrett's esophagus without dysplasia: Secondary | ICD-10-CM | POA: Diagnosis not present

## 2014-05-21 DIAGNOSIS — G4730 Sleep apnea, unspecified: Secondary | ICD-10-CM | POA: Diagnosis not present

## 2014-05-21 DIAGNOSIS — K449 Diaphragmatic hernia without obstruction or gangrene: Secondary | ICD-10-CM | POA: Insufficient documentation

## 2014-05-21 HISTORY — PX: ESOPHAGOGASTRODUODENOSCOPY: SHX5428

## 2014-05-21 SURGERY — EGD (ESOPHAGOGASTRODUODENOSCOPY)
Anesthesia: Moderate Sedation

## 2014-05-21 MED ORDER — LIDOCAINE VISCOUS 2 % MT SOLN
OROMUCOSAL | Status: DC | PRN
  Administered 2014-05-21: 1 via OROMUCOSAL

## 2014-05-21 MED ORDER — LIDOCAINE VISCOUS 2 % MT SOLN
OROMUCOSAL | Status: AC
  Filled 2014-05-21: qty 15

## 2014-05-21 MED ORDER — MIDAZOLAM HCL 5 MG/5ML IJ SOLN
INTRAMUSCULAR | Status: AC
  Filled 2014-05-21: qty 10

## 2014-05-21 MED ORDER — SODIUM CHLORIDE 0.9 % IV SOLN
INTRAVENOUS | Status: DC
  Administered 2014-05-21: 10:00:00 via INTRAVENOUS

## 2014-05-21 MED ORDER — MEPERIDINE HCL 100 MG/ML IJ SOLN
INTRAMUSCULAR | Status: DC | PRN
  Administered 2014-05-21: 25 mg via INTRAVENOUS

## 2014-05-21 MED ORDER — MEPERIDINE HCL 100 MG/ML IJ SOLN
INTRAMUSCULAR | Status: DC
Start: 2014-05-21 — End: 2014-05-21
  Filled 2014-05-21: qty 2

## 2014-05-21 MED ORDER — MIDAZOLAM HCL 5 MG/5ML IJ SOLN
INTRAMUSCULAR | Status: DC | PRN
  Administered 2014-05-21: 1 mg via INTRAVENOUS
  Administered 2014-05-21: 2 mg via INTRAVENOUS

## 2014-05-21 MED ORDER — SIMETHICONE 40 MG/0.6ML PO SUSP
ORAL | Status: DC | PRN
  Administered 2014-05-21: 11:00:00

## 2014-05-21 NOTE — Interval H&P Note (Signed)
History and Physical Interval Note:  05/21/2014 10:49 AM  Jerry Macdonald  has presented today for surgery, with the diagnosis of BARRETTS ESOPHAGUS  The various methods of treatment have been discussed with the patient and family. After consideration of risks, benefits and other options for treatment, the patient has consented to  Procedure(s) with comments: ESOPHAGOGASTRODUODENOSCOPY (EGD) (N/A) - 11:00 as a surgical intervention .  The patient's history has been reviewed, patient examined, no change in status, stable for surgery.  I have reviewed the patient's chart and labs.  Questions were answered to the patient's satisfaction.     [employer]

## 2014-05-21 NOTE — Op Note (Signed)
[facility] [address] [place], [address]   ENDOSCOPY PROCEDURE REPORT  PATIENT: Jerry Macdonald, Jerry Macdonald  MR#: [id number] BIRTHDATE: 23-Nov-1930 , 21  yrs. old GENDER: Male  ENDOSCOPIST: Barney Drain, MD REFERRED ES:PQZR Hall, M.D.  PROCEDURE DATE: 05/21/2014 PROCEDURE:   EGD w/ biopsy  INDICATIONS:Barrett's screening. MEDICATIONS: Demerol 25 mg IV and Versed 3 mg IV TOPICAL ANESTHETIC:   Viscous Xylocaine  DESCRIPTION OF PROCEDURE:     Physical exam was performed.  Informed consent was obtained from the patient after explaining the benefits, risks, and alternatives to the procedure.  The patient was connected to the monitor and placed in the left lateral position.  Continuous oxygen was provided by nasal cannula and IV medicine administered through an indwelling cannula.  After administration of sedation, the patients esophagus was intubated and the EG-[id number] ([id number])  endoscope was advanced under direct visualization to the second portion of the duodenum.  The scope was removed slowly by carefully examining the color, texture, anatomy, and integrity of the mucosa on the way out.  The patient was recovered in endoscopy and discharged home in satisfactory condition.   ESOPHAGUS: There was evidence of Barrett's esophagus 33 cm from the incisors.  Multiple biopsies were performed using cold forceps(34,36,38 CM). GE JUNCTION 40 CM FROM THE TEETH..   STOMACH: A medium sized hiatal hernia was noted.   The stomach otherwise appeared normal.   DUODENUM: The duodenal mucosa showed no abnormalities in the bulb and second portion of the duodenum. COMPLICATIONS:   None  ENDOSCOPIC IMPRESSION: 1.   Barrett's esophagus 2.   Medium sized hiatal hernia  RECOMMENDATIONS: AWAIT BIOPSY OMEPRAZOLE DAILY REPEAT EGD IN 3 YEARS   REPEAT EXAM:   _______________________________ Lorrin MaisBarney Drain, MD 05/21/2014 1:49 PM
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
## 2014-05-21 NOTE — Discharge Instructions (Signed)
You have BARRETT'S ESOPHAGUS. IT LOOKS THE SAME AS IT DID IN 2012. YOU HAVE A MODERATE SIZE HIATAL HERNIA. I biopsied your ESOPHAGUS.    TAKE OMEPRAZOLE 30 MINUTES PRIOR TO A MEAL ONCE DAILY FOREVER.  FOLLOW A LOW FAT DIET. SEE INFO BELOW.  YOUR BIOPSY WILL BE BACK IN 14 DAYS.  REPEAT EGD IN 3 YEARS.   UPPER ENDOSCOPY AFTER CARE Read the instructions outlined below and refer to this sheet in the next week. These discharge instructions provide you with general information on caring for yourself after you leave the hospital. While your treatment has been planned according to the most current medical practices available, unavoidable complications occasionally occur. If you have any problems or questions after discharge, call DR. Thanvi Blincoe, [phone].  ACTIVITY  You may resume your regular activity, but move at a slower pace for the next 24 hours.   Take frequent rest periods for the next 24 hours.   Walking will help get rid of the air and reduce the bloated feeling in your belly (abdomen).   No driving for 24 hours (because of the medicine (anesthesia) used during the test).   You may shower.   Do not sign any important legal documents or operate any machinery for 24 hours (because of the anesthesia used during the test).    NUTRITION  Drink plenty of fluids.   You may resume your normal diet as instructed by your doctor.   Begin with a light meal and progress to your normal diet. Heavy or fried foods are harder to digest and may make you feel sick to your stomach (nauseated).   Avoid alcoholic beverages for 24 hours or as instructed.    MEDICATIONS  You may resume your normal medications.   WHAT YOU CAN EXPECT TODAY  Some feelings of bloating in the abdomen.   Passage of more gas than usual.    IF YOU HAD A BIOPSY TAKEN DURING THE UPPER ENDOSCOPY:  Eat a soft diet IF YOU HAVE NAUSEA, BLOATING, ABDOMINAL PAIN, OR VOMITING.    FINDING OUT THE RESULTS OF YOUR  TEST Not all test results are available during your visit. [name] WILL CALL YOU WITHIN 14 DAYS OF YOUR PROCEDUE WITH YOUR RESULTS. Do not assume everything is normal if you have not heard from DR. Meir Elwood IN ONE WEEK, CALL HER OFFICE AT [phone].  SEEK IMMEDIATE MEDICAL ATTENTION AND CALL THE OFFICE: [phone] IF:  You have more than a spotting of blood in your stool.   Your belly is swollen (abdominal distention).   You are nauseated or vomiting.   You have a temperature over 101F.   You have abdominal pain or discomfort that is severe or gets worse throughout the day.   Hiatal Hernia A hiatal hernia occurs when a part of the stomach slides above the diaphragm. The diaphragm is the thin muscle separating the belly (abdomen) from the chest. A hiatal hernia can be something you are born with or develop over time. Hiatal hernias may allow stomach acid to flow back into your esophagus, the tube which carries food from your mouth to your stomach. If this acid causes problems it is called GERD (gastro-esophageal reflux disease).   SYMPTOMS Common symptoms of GERD are heartburn (burning in your chest). This is worse when lying down or bending over. It may also cause belching and indigestion. Some of the things which make GERD worse are:  Increased weight pushes on stomach making acid rise more easily.  Smoking markedly increases acid production.   Alcohol decreases lower esophageal sphincter pressure (valve between stomach and esophagus), allowing acid from stomach into esophagus.   Late evening meals and going to bed with a full stomach increases pressure.   Anything that causes an increase in acid production.   Lower esophageal sphincter incompetence.    HOME CARE INSTRUCTIONS  Try to achieve and maintain an ideal body weight.   Avoid drinking alcoholic beverages.   Stop smoking.   Put the head of your bed on 4 to 6 inch blocks. This will keep your head and esophagus  higher than your stomach. If you cannot use blocks, sleep with several pillows under your head and shoulders.   MINIMIZE THE USE OF aspirin, ibuprofen (Advil or Motrin), or other nonsteroidal anti-inflammatory drugs.   Do not wear tight clothing around your chest or stomach.   Eat smaller meals and eat more frequently. This keeps your stomach from getting too full. Eat slowly.   Do not lie down for 2 or 3 hours after eating. Do not eat or drink anything 1 to 2 hours before going to bed.   Avoid caffeine beverages (colas, coffee, cocoa, tea), fatty foods, citrus fruits and all other foods and drinks that contain acid and that seem to increase the problems.   Avoid bending over, especially after eating. Also avoid straining during bowel movements or when urinating or lifting things. Anything that increases the pressure in your belly increases the amount of acid that may be pushed up into your esophagus.   Low-Fat Diet  BREADS, CEREALS, PASTA, RICE, DRIED PEAS, AND BEANS These products are high in carbohydrates and most are low in fat. Therefore, they can be increased in the diet as substitutes for fatty foods. They too, however, contain calories and should not be eaten in excess. Cereals can be eaten for snacks as well as for breakfast.  Include foods that contain fiber (fruits, vegetables, whole grains, and legumes). Research shows that fiber may lower blood cholesterol levels, especially the water-soluble fiber found in fruits, vegetables, oat products, and legumes.  FRUITS AND VEGETABLES It is good to eat fruits and vegetables. Besides being sources of fiber, both are rich in vitamins and some minerals. They help you get the daily allowances of these nutrients. Fruits and vegetables can be used for snacks and desserts.  MEATS Limit lean meat, chicken, [place], and fish to no more than 6 ounces per day.  Beef, Pork, and Lamb Use lean cuts of beef, pork, and lamb. Lean cuts include:    Extra-lean ground beef.  Arm roast.  Sirloin tip.  Center-cut ham.  Round steak.  Loin chops.  Rump roast.  Tenderloin.  Trim all fat off the outside of meats before cooking. It is not necessary to severely decrease the intake of red meat, but lean choices should be made. Lean meat is rich in protein and contains a highly absorbable form of iron. Premenopausal women, in particular, should avoid reducing lean red meat because this could increase the risk for low red blood cells (iron-deficiency anemia).  Chicken and [place] These are good sources of protein. The fat of poultry can be reduced by removing the skin and underlying fat layers before cooking. Chicken and [place] can be substituted for lean red meat in the diet. Poultry should not be fried or covered with high-fat sauces.  Fish and Shellfish Fish is a good source of protein. Shellfish contain cholesterol, but they usually are low in saturated  fatty acids. The preparation of fish is important. Like chicken and [place], they should not be fried or covered with high-fat sauces.  EGGS Egg whites contain no fat or cholesterol. They can be eaten often. Try 1 to 2 egg whites instead of whole eggs in recipes or use egg substitutes that do not contain yolk.  MILK AND DAIRY PRODUCTS Use skim or 1% milk instead of 2% or whole milk. Decrease whole milk, natural, and processed cheeses. Use nonfat or low-fat (2%) cottage cheese or low-fat cheeses made from vegetable oils. Choose nonfat or low-fat (1 to 2%) yogurt. Experiment with evaporated skim milk in recipes that call for heavy cream. Substitute low-fat yogurt or low-fat cottage cheese for sour cream in dips and salad dressings. Have at least 2 servings of low-fat dairy products, such as 2 glasses of skim (or 1%) milk each day to help get your daily calcium intake.  FATS AND OILS Reduce the total intake of fats, especially saturated fat. Butterfat, lard, and beef fats are high in saturated fat  and cholesterol. These should be avoided as much as possible. Vegetable fats do not contain cholesterol, but certain vegetable fats, such as coconut oil, palm oil, and palm kernel oil are very high in saturated fats. These should be limited. These fats are often used in bakery goods, processed foods, popcorn, oils, and nondairy creamers. Vegetable shortenings and some peanut butters contain hydrogenated oils, which are also saturated fats. Read the labels on these foods and check for saturated vegetable oils.  Unsaturated vegetable oils and fats do not raise blood cholesterol. However, they should be limited because they are fats and are high in calories. Total fat should still be limited to 30% of your daily caloric intake. Desirable liquid vegetable oils are corn oil, cottonseed oil, olive oil, canola oil, safflower oil, soybean oil, and sunflower oil. Peanut oil is not as good, but small amounts are acceptable. Buy a heart-healthy tub margarine that has no partially hydrogenated oils in the ingredients. Mayonnaise and salad dressings often are made from unsaturated fats, but they should also be limited because of their high calorie and fat content. Seeds, nuts, peanut butter, olives, and avocados are high in fat, but the fat is mainly the unsaturated type. These foods should be limited mainly to avoid excess calories and fat.  OTHER EATING TIPS Snacks  Most sweets should be limited as snacks. They tend to be rich in calories and fats, and their caloric content outweighs their nutritional value. Some good choices in snacks are graham crackers, melba toast, soda crackers, bagels (no egg), English muffins, fruits, and vegetables. These snacks are preferable to snack crackers, [place] fries, and chips. Popcorn should be air-popped or cooked in small amounts of liquid vegetable oil.  Desserts Eat fruit, low-fat yogurt, and fruit ices instead of pastries, cake, and cookies. Sherbet, angel food cake, gelatin  dessert, frozen low-fat yogurt, or other frozen products that do not contain saturated fat (pure fruit juice bars, frozen ice pops) are also acceptable.   COOKING METHODS Choose those methods that use little or no fat. They include: Poaching.  Braising.  Steaming.  Grilling.  Baking.  Stir-frying.  Broiling.  Microwaving.  Foods can be cooked in a nonstick pan without added fat, or use a nonfat cooking spray in regular cookware. Limit fried foods and avoid frying in saturated fat. Add moisture to lean meats by using water, broth, cooking wines, and other nonfat or low-fat sauces along with the cooking methods  mentioned above. Soups and stews should be chilled after cooking. The fat that forms on top after a few hours in the refrigerator should be skimmed off. When preparing meals, avoid using excess salt. Salt can contribute to raising blood pressure in some people.  EATING AWAY FROM HOME Order entres, potatoes, and vegetables without sauces or butter. When meat exceeds the size of a deck of cards (3 to 4 ounces), the rest can be taken home for another meal. Choose vegetable or fruit salads and ask for low-calorie salad dressings to be served on the side. Use dressings sparingly. Limit high-fat toppings, such as bacon, crumbled eggs, cheese, sunflower seeds.

## 2014-06-02 ENCOUNTER — Other Ambulatory Visit: Payer: Self-pay | Admitting: Gastroenterology

## 2014-06-14 DIAGNOSIS — Z23 Encounter for immunization: Secondary | ICD-10-CM | POA: Diagnosis not present

## 2014-06-14 DIAGNOSIS — R972 Elevated prostate specific antigen [PSA]: Secondary | ICD-10-CM | POA: Diagnosis not present

## 2014-06-14 DIAGNOSIS — E669 Obesity, unspecified: Secondary | ICD-10-CM | POA: Diagnosis not present

## 2014-06-14 DIAGNOSIS — N400 Enlarged prostate without lower urinary tract symptoms: Secondary | ICD-10-CM | POA: Diagnosis not present

## 2014-06-21 DIAGNOSIS — G4730 Sleep apnea, unspecified: Secondary | ICD-10-CM | POA: Diagnosis not present

## 2014-06-21 DIAGNOSIS — N429 Disorder of prostate, unspecified: Secondary | ICD-10-CM | POA: Diagnosis not present

## 2014-06-21 DIAGNOSIS — D509 Iron deficiency anemia, unspecified: Secondary | ICD-10-CM | POA: Diagnosis not present

## 2014-06-21 DIAGNOSIS — R7301 Impaired fasting glucose: Secondary | ICD-10-CM | POA: Diagnosis not present

## 2014-06-23 ENCOUNTER — Telehealth: Payer: Self-pay | Admitting: Gastroenterology

## 2014-06-23 MED ORDER — OMEPRAZOLE 20 MG PO CPDR: DELAYED_RELEASE_CAPSULE | ORAL | Status: DC

## 2014-06-23 NOTE — Telephone Encounter (Signed)
Called patient TO DISCUSS RESULTS OF BELLY. ESOPHAGEAL BIOPSIES SHOW INDETERMINATE FOR DYSPLASIA AND ESOPHAGITIS.  STRICTLY FOLLOW A LOW FAT DIET. LOSE 10 LBS. INCREASE OMEPRAZOLE TO BID. PT HAS AN APPT IN NOV. REPEAT EGD IN APR 2016.

## 2014-06-24 NOTE — Telephone Encounter (Signed)
Reminder in epic °

## 2014-07-05 ENCOUNTER — Ambulatory Visit (INDEPENDENT_AMBULATORY_CARE_PROVIDER_SITE_OTHER): Payer: Medicare Other | Admitting: Gastroenterology

## 2014-07-05 ENCOUNTER — Encounter: Payer: Self-pay | Admitting: Gastroenterology

## 2014-07-05 VITALS — BP 123/71 | HR 73 | Temp 96.8°F | Ht 71.0 in | Wt 232.0 lb

## 2014-07-05 DIAGNOSIS — K2270 Barrett's esophagus without dysplasia: Secondary | ICD-10-CM | POA: Diagnosis not present

## 2014-07-05 NOTE — Patient Instructions (Signed)
Continue to take Prilosec twice a day, 30 minutes before breakfast and dinner. I have attached a reflux diet sheet handout.   We will see you back in March 2016.   Gastroesophageal Reflux Disease, Adult Gastroesophageal reflux disease (GERD) happens when acid from your stomach flows up into the esophagus. When acid comes in contact with the esophagus, the acid causes soreness (inflammation) in the esophagus. Over time, GERD may create small holes (ulcers) in the lining of the esophagus. CAUSES   Increased body weight. This puts pressure on the stomach, making acid rise from the stomach into the esophagus.  Smoking. This increases acid production in the stomach.  Drinking alcohol. This causes decreased pressure in the lower esophageal sphincter (valve or ring of muscle between the esophagus and stomach), allowing acid from the stomach into the esophagus.  Late evening meals and a full stomach. This increases pressure and acid production in the stomach.  A malformed lower esophageal sphincter. Sometimes, no cause is found. SYMPTOMS   Burning pain in the lower part of the mid-chest behind the breastbone and in the mid-stomach area. This may occur twice a week or more often.  Trouble swallowing.  Sore throat.  Dry cough.  Asthma-like symptoms including chest tightness, shortness of breath, or wheezing. DIAGNOSIS  Your caregiver may be able to diagnose GERD based on your symptoms. In some cases, X-rays and other tests may be done to check for complications or to check the condition of your stomach and esophagus. TREATMENT  Your caregiver may recommend over-the-counter or prescription medicines to help decrease acid production. Ask your caregiver before starting or adding any new medicines.  HOME CARE INSTRUCTIONS   Change the factors that you can control. Ask your caregiver for guidance concerning weight loss, quitting smoking, and alcohol consumption.  Avoid foods and drinks that  make your symptoms worse, such as:  Caffeine or alcoholic drinks.  Chocolate.  Peppermint or mint flavorings.  Garlic and onions.  Spicy foods.  Citrus fruits, such as oranges, lemons, or limes.  Tomato-based foods such as sauce, chili, salsa, and pizza.  Fried and fatty foods.  Avoid lying down for the 3 hours prior to your bedtime or prior to taking a nap.  Eat small, frequent meals instead of large meals.  Wear loose-fitting clothing. Do not wear anything tight around your waist that causes pressure on your stomach.  Raise the head of your bed 6 to 8 inches with wood blocks to help you sleep. Extra pillows will not help.  Only take over-the-counter or prescription medicines for pain, discomfort, or fever as directed by your caregiver.  Do not take aspirin, ibuprofen, or other nonsteroidal anti-inflammatory drugs (NSAIDs). SEEK IMMEDIATE MEDICAL CARE IF:   You have pain in your arms, neck, jaw, teeth, or back.  Your pain increases or changes in intensity or duration.  You develop nausea, vomiting, or sweating (diaphoresis).  You develop shortness of breath, or you faint.  Your vomit is green, yellow, black, or looks like coffee grounds or blood.  Your stool is red, bloody, or black. These symptoms could be signs of other problems, such as heart disease, gastric bleeding, or esophageal bleeding. MAKE SURE YOU:   Understand these instructions.  Will watch your condition.  Will get help right away if you are not doing well or get worse. Document Released: 05/30/2005 Document Revised: 11/12/2011 Document Reviewed: 03/09/2011 [facility] Patient Information 2015 [place], [place]. This information is not intended to replace advice given to you by  your health care provider. Make sure you discuss any questions you have with your health care provider.  

## 2014-07-05 NOTE — Assessment & Plan Note (Signed)
80 year old with Barrett's, with most recent EGD indeterminate for dysplasia. Omeprazole increased to BID. GERD symptoms controlled. Discussed in detail dietary and behavior modification. Provided GERD handout. Return in March 2016 to set up surveillance EGD for April 2016.

## 2014-07-05 NOTE — Progress Notes (Signed)
Primary Care Physician:  [name], MD  Primary GI: [name]   Chief Complaint  Patient presents with  . Follow-up    HPI:   Jerry Macdonald presents today in follow-up with history of Barrett;s esophagus. Most recent EGD with Barrett's and indeterminate evidence for dysplasia. For this reason, repeat EGD has been recommended for April 2016.   On Prilosec BID. Sometimes eats a full breakfast then goes to nap. Denies dysphagia, abdominal pain, constipation, rectal bleeding.    Past Medical History  Diagnosis Date  . Sleep apnea   . Pyogenic granuloma     left cheeck  . Back pain     with radiculopathy  . Hyperlipidemia   . Seborrheic keratosis   . Obesity   . Malaise and fatigue   . Cataracts, bilateral   . Constipation   . Hypertrophy of prostate   . S/P endoscopy June 2011    Barrett's, mild gastritis and duodenitis  . S/P colonoscopy June 2011    rectal bleeding secondary to hemorrhoids  . S/P colonoscopy 2007    [facility], diverticulosis per Dr. Juel Burrow note    Past Surgical History  Procedure Laterality Date  . Hernia repair    . Varicose vein surgery    . Umbilical hernia repair    . Esophagogastroduodenoscopy  06/11/2011    Procedure: ESOPHAGOGASTRODUODENOSCOPY (EGD);  Surgeon: Dorothyann Peng, MD;  Location: [facility];  Service: Endoscopy;  Laterality: N/A;  11:00  . Colonoscopy  JUN 2011    PAN-COLONIC DIVERTICULOSIS  . Colonoscopy  2007    [facility]  . Esophagogastroduodenoscopy N/A 05/21/2014    Dr. Fields:Barrett's esophagus/ Medium sized hiatal hernia. path with indefinite dysplasia.     Current Outpatient Prescriptions  Medication Sig Dispense Refill  . aspirin EC 81 MG tablet Take 81 mg by mouth daily.      . Multiple Vitamins-Minerals (MULTIVITAMINS THER. W/MINERALS) TABS Take 1 tablet by mouth daily.      Marland Kitchen omeprazole (PRILOSEC) 20 MG capsule 1 PO 30 MINS PRIOR TO MEALS BID 180 capsule 3  . tamsulosin (FLOMAX) 0.4 MG CAPS capsule  Take 0.4 mg by mouth.     No current facility-administered medications for this visit.    Allergies as of 07/05/2014 - Review Complete 07/05/2014  Allergen Reaction Noted  . Penicillins  07/07/2007    Family History  Problem Relation Age of Onset  . Colon polyps Neg Hx   . Colon cancer Neg Hx     History   Social History  . Marital Status: Divorced    Spouse Name: N/A    Number of Children: N/A  . Years of Education: N/A   Social History Main Topics  . Smoking status: Former Smoker -- 0.50 packs/day    Types: Cigarettes  . Smokeless tobacco: None     Comment: quit in 1965  . Alcohol Use: No  . Drug Use: No  . Sexual Activity: None   Other Topics Concern  . None   Social History Narrative    Review of Systems: As mentioned in HPI  Physical Exam: BP 123/71 mmHg  Pulse 73  Temp(Src) 96.8 F (36 C) (Oral)  Ht 5\' 11"  (1.803 m)  Wt 232 lb (105.235 kg)  BMI 32.37 kg/m2 General:   Alert and oriented. No distress noted. Pleasant and cooperative.  Head:  Normocephalic and atraumatic. Heart:  S1, S2 present without murmurs, rubs, or gallops. Regular rate and rhythm.  Abdomen:  +BS, soft, obese, large AP diameter. non-tender and non-distended. No rebound or guarding. No HSM or masses noted. Msk:  Symmetrical without gross deformities. Normal posture. Extremities:  Without edema. Neurologic:  Alert and  oriented x4;  grossly normal neurologically. Psych:  Alert and cooperative. Normal mood and affect.

## 2014-07-05 NOTE — Progress Notes (Signed)
cc'ed to pcp °

## 2014-08-13 DIAGNOSIS — J069 Acute upper respiratory infection, unspecified: Secondary | ICD-10-CM | POA: Diagnosis not present

## 2014-08-13 DIAGNOSIS — R05 Cough: Secondary | ICD-10-CM | POA: Diagnosis not present

## 2014-09-10 ENCOUNTER — Ambulatory Visit (INDEPENDENT_AMBULATORY_CARE_PROVIDER_SITE_OTHER): Payer: Medicare Other | Admitting: Urology

## 2014-09-10 DIAGNOSIS — N401 Enlarged prostate with lower urinary tract symptoms: Secondary | ICD-10-CM

## 2014-09-10 DIAGNOSIS — R3915 Urgency of urination: Secondary | ICD-10-CM

## 2014-09-10 DIAGNOSIS — R972 Elevated prostate specific antigen [PSA]: Secondary | ICD-10-CM | POA: Diagnosis not present

## 2014-09-17 ENCOUNTER — Other Ambulatory Visit: Payer: Self-pay | Admitting: Urology

## 2014-09-17 DIAGNOSIS — R972 Elevated prostate specific antigen [PSA]: Secondary | ICD-10-CM

## 2014-09-23 ENCOUNTER — Other Ambulatory Visit: Payer: Self-pay | Admitting: Urology

## 2014-09-23 DIAGNOSIS — R972 Elevated prostate specific antigen [PSA]: Secondary | ICD-10-CM

## 2014-10-01 ENCOUNTER — Ambulatory Visit (HOSPITAL_COMMUNITY)
Admission: RE | Admit: 2014-10-01 | Discharge: 2014-10-01 | Disposition: A | Discharge status: Home / Self Care | Payer: Medicare Other | Source: Ambulatory Visit | Attending: Urology | Admitting: Urology

## 2014-10-01 DIAGNOSIS — R972 Elevated prostate specific antigen [PSA]: Secondary | ICD-10-CM | POA: Diagnosis not present

## 2014-10-01 MED ORDER — LIDOCAINE HCL (PF) 1 % IJ SOLN
INTRAMUSCULAR | Status: AC
  Filled 2014-10-01: qty 5

## 2014-10-01 MED ORDER — CEFTRIAXONE SODIUM 1 G IJ SOLR
INTRAMUSCULAR | Status: AC
  Filled 2014-10-01: qty 10

## 2014-10-01 NOTE — Discharge Instructions (Signed)
Transrectal Ultrasound-Guided Biopsy °A transrectal ultrasound-guided biopsy is a procedure to remove samples of tissue from your prostate using ultrasound images to guide the procedure. The procedure is usually done to evaluate the prostate gland of men who have an elevated prostate-specific antigen (PSA). PSA is a blood test to screen for prostate cancer. The biopsy samples are taken to check for prostate cancer.  °LET YOUR HEALTH CARE PROVIDER KNOW ABOUT: °· Any allergies you have. °· All medicines you are taking, including vitamins, herbs, eye drops, creams, and over-the-counter medicines. °· Previous problems you or members of your family have had with the use of anesthetics. °· Any blood disorders you have. °· Previous surgeries you have had. °· Medical conditions you have. °RISKS AND COMPLICATIONS °Generally, this is a safe procedure. However, as with any procedure, problems can occur. Possible problems include: °· Infection of your prostate. °· Bleeding from your rectum or blood in your urine. °· Difficulty urinating. °· Nerve damage (this is usually temporary). °· Damage to surrounding structures such as blood vessels, organs, and muscles, which would require other procedures. °BEFORE THE PROCEDURE °· Do not eat or drink anything after midnight on the night before the procedure or as directed by your health care provider. °· Take medicines only as directed by your health care provider. °· Your health care provider may have you stop taking certain medicines 5-7 days before the procedure. °· You will be given an enema before the procedure. During an enema, a liquid is injected into your rectum to clear out waste. °· You may have lab tests the day of your procedure.   °· Plan to have someone take you home after the procedure. °PROCEDURE  °· You will be given medicine to help you relax (sedative) before the procedure. An IV tube will be inserted into one of your veins and used to give fluids and  medicine. °· You will be given antibiotic medicine to reduce the risk of an infection. °· You will be placed on your side for the procedure. °· A probe with lubricated gel will be placed into your rectum, and images will be taken of your prostate and surrounding structures. °· Numbing medicine will be injected into the prostate before the biopsy samples are taken. °· A biopsy needle will then be inserted and guided to your prostate with the use of the ultrasound images. °· Samples of prostate tissue will be taken, and the needle will then be removed. °· The biopsy samples will be sent to a lab to be analyzed. Results are usually back in 2-3 days. °AFTER THE PROCEDURE °· You will be taken to a recovery area where you will be monitored. °· You may have some discomfort in the rectal area. You will be given pain medicines to control this. °· You may be allowed to go home the same day, or you may need to stay in the hospital overnight. °Document Released: 01/04/2014 Document Reviewed: 04/08/2013 °[employer]® Patient Information ©2015 [employer]. This information is not intended to replace advice given to you by your health care provider. Make sure you discuss any questions you have with your health care provider. ° °

## 2014-10-04 DIAGNOSIS — C61 Malignant neoplasm of prostate: Secondary | ICD-10-CM | POA: Diagnosis not present

## 2014-10-08 ENCOUNTER — Ambulatory Visit (INDEPENDENT_AMBULATORY_CARE_PROVIDER_SITE_OTHER): Payer: Medicare Other | Admitting: Urology

## 2014-10-08 DIAGNOSIS — R3915 Urgency of urination: Secondary | ICD-10-CM | POA: Diagnosis not present

## 2014-10-08 DIAGNOSIS — N401 Enlarged prostate with lower urinary tract symptoms: Secondary | ICD-10-CM | POA: Diagnosis not present

## 2014-10-08 DIAGNOSIS — C61 Malignant neoplasm of prostate: Secondary | ICD-10-CM | POA: Diagnosis not present

## 2014-10-08 DIAGNOSIS — R972 Elevated prostate specific antigen [PSA]: Secondary | ICD-10-CM

## 2014-11-02 ENCOUNTER — Encounter: Payer: Self-pay | Admitting: Gastroenterology

## 2014-12-01 ENCOUNTER — Ambulatory Visit (INDEPENDENT_AMBULATORY_CARE_PROVIDER_SITE_OTHER): Payer: Medicare Other | Admitting: Gastroenterology

## 2014-12-01 ENCOUNTER — Encounter: Payer: Self-pay | Admitting: Gastroenterology

## 2014-12-01 ENCOUNTER — Other Ambulatory Visit: Payer: Self-pay

## 2014-12-01 VITALS — BP 127/68 | HR 58 | Temp 96.7°F | Ht 71.0 in | Wt 221.6 lb

## 2014-12-01 DIAGNOSIS — K2270 Barrett's esophagus without dysplasia: Secondary | ICD-10-CM

## 2014-12-01 NOTE — Patient Instructions (Signed)
We have set you up for a repeat upper endoscopy with [name].   Further recommendations to follow.  Continue omeprazole twice a day.

## 2014-12-01 NOTE — Assessment & Plan Note (Signed)
81 year old male with Barrett's esophagus and EGD in Sept 2015 with indefinite dysplasia, now due for early interval surveillance April 2016. No concerning upper or lower GI symptoms. Continue omeprazole BID.   Proceed with upper endoscopy in the near future with [name]. The risks, benefits, and alternatives have been discussed in detail with patient. They have stated understanding and desire to proceed.

## 2014-12-01 NOTE — Progress Notes (Signed)
Referring Provider: [name], MD Primary Care Physician:  [name], MD  Primary GI: [name]   Chief Complaint  Patient presents with  . repeat EGD, Barretts    HPI:   Jerry Macdonald is a 81 y.o. male presenting today with a history of Barrett's esophagus, with EGD Sept 2015 noting indefinite dysplasia. For this reason, repeat early interval surveillance for April 2016 has been recommended.   Denies abdominal pain. No N/V. No GERD. No rectal bleeding, constipation or diarrhea. Recently diagnosed with prostate cancer. Prilosec BID.   Past Medical History  Diagnosis Date  . Sleep apnea   . Pyogenic granuloma     left cheeck  . Back pain     with radiculopathy  . Hyperlipidemia   . Seborrheic keratosis   . Obesity   . Malaise and fatigue   . Cataracts, bilateral   . Constipation   . Hypertrophy of prostate   . S/P endoscopy June 2011    Barrett's, mild gastritis and duodenitis  . S/P colonoscopy June 2011    rectal bleeding secondary to hemorrhoids  . S/P colonoscopy 2007    [facility], diverticulosis per Dr. Juel Burrow note    Past Surgical History  Procedure Laterality Date  . Hernia repair    . Varicose vein surgery    . Umbilical hernia repair    . Esophagogastroduodenoscopy  06/11/2011    Procedure: ESOPHAGOGASTRODUODENOSCOPY (EGD);  Surgeon: Dorothyann Peng, MD;  [facility]: [facility];  Service: Endoscopy;  Laterality: N/A;  11:00  . Colonoscopy  JUN 2011    PAN-COLONIC DIVERTICULOSIS  . Colonoscopy  2007    [place] [facility]  . Esophagogastroduodenoscopy N/A 05/21/2014    Dr. Fields:Barrett's esophagus/ Medium sized hiatal hernia. path with indefinite dysplasia.     Current Outpatient Prescriptions  Medication Sig Dispense Refill  . MAGNESIUM-OXIDE PO Take 1 capsule by mouth daily.    . Multiple Vitamins-Minerals (MULTIVITAMINS THER. W/MINERALS) TABS Take 1 tablet by mouth daily.      Marland Kitchen omeprazole (PRILOSEC) 20 MG capsule 1 PO 30 MINS PRIOR TO MEALS BID 180  capsule 3  . tamsulosin (FLOMAX) 0.4 MG CAPS capsule Take 0.4 mg by mouth.    Marland Kitchen aspirin EC 81 MG tablet Take 81 mg by mouth daily.       No current facility-administered medications for this visit.    Allergies as of 12/01/2014 - Review Complete 12/01/2014  Allergen Reaction Noted  . Penicillins  07/07/2007    Family History  Problem Relation Age of Onset  . Colon polyps Neg Hx   . Colon cancer Neg Hx     History   Social History  . Marital Status: Divorced    Spouse Name: N/A  . Number of Children: N/A  . Years of Education: N/A   Social History Main Topics  . Smoking status: Former Smoker -- 0.50 packs/day    Types: Cigarettes  . Smokeless tobacco: Not on file     Comment: quit in 1965  . Alcohol Use: No  . Drug Use: No  . Sexual Activity: Not on file   Other Topics Concern  . None   Social History Narrative    Review of Systems: As mentioned in HPI.   Physical Exam: BP 127/68 mmHg  Pulse 58  Temp(Src) 96.7 F (35.9 C)  Ht 5\' 11"  (1.803 m)  Wt 221 lb 9.6 oz (100.517 kg)  BMI 30.92 kg/m2 General:   Alert and oriented. No  distress noted. Pleasant and cooperative.  Head:  Normocephalic and atraumatic. Eyes:  Conjuctiva clear without scleral icterus. Mouth:  Oral mucosa pink and moist. Good dentition. No lesions. Heart:  S1, S2 present without murmurs, rubs, or gallops. Regular rate and rhythm. Abdomen:  +BS, soft, non-tender and non-distended. No rebound or guarding. No HSM or masses noted. Obese.  Msk:  Symmetrical without gross deformities. Normal posture. Extremities:  Without edema. Neurologic:  Alert and  oriented x4;  grossly normal neurologically. Skin:  Intact without significant lesions or rashes. Psych:  Alert and cooperative. Normal mood and affect.

## 2014-12-01 NOTE — Progress Notes (Signed)
cc'ed to pcp °

## 2014-12-20 ENCOUNTER — Ambulatory Visit (HOSPITAL_COMMUNITY)
Admission: RE | Admit: 2014-12-20 | Discharge: 2014-12-20 | Disposition: A | Discharge status: Home / Self Care | Payer: Medicare Other | Source: Ambulatory Visit | Attending: Gastroenterology | Admitting: Gastroenterology

## 2014-12-20 ENCOUNTER — Encounter (HOSPITAL_COMMUNITY)
Admission: RE | Disposition: A | Discharge status: Home / Self Care | Payer: Self-pay | Source: Ambulatory Visit | Attending: Gastroenterology

## 2014-12-20 DIAGNOSIS — Z7982 Long term (current) use of aspirin: Secondary | ICD-10-CM | POA: Insufficient documentation

## 2014-12-20 DIAGNOSIS — K22719 Barrett's esophagus with dysplasia, unspecified: Secondary | ICD-10-CM | POA: Diagnosis not present

## 2014-12-20 DIAGNOSIS — E785 Hyperlipidemia, unspecified: Secondary | ICD-10-CM | POA: Diagnosis not present

## 2014-12-20 DIAGNOSIS — K317 Polyp of stomach and duodenum: Secondary | ICD-10-CM | POA: Diagnosis not present

## 2014-12-20 DIAGNOSIS — E669 Obesity, unspecified: Secondary | ICD-10-CM | POA: Insufficient documentation

## 2014-12-20 DIAGNOSIS — G4730 Sleep apnea, unspecified: Secondary | ICD-10-CM | POA: Insufficient documentation

## 2014-12-20 DIAGNOSIS — K259 Gastric ulcer, unspecified as acute or chronic, without hemorrhage or perforation: Secondary | ICD-10-CM | POA: Diagnosis not present

## 2014-12-20 DIAGNOSIS — N400 Enlarged prostate without lower urinary tract symptoms: Secondary | ICD-10-CM | POA: Diagnosis not present

## 2014-12-20 DIAGNOSIS — K2270 Barrett's esophagus without dysplasia: Secondary | ICD-10-CM | POA: Diagnosis not present

## 2014-12-20 DIAGNOSIS — Z6830 Body mass index (BMI) 30.0-30.9, adult: Secondary | ICD-10-CM | POA: Diagnosis not present

## 2014-12-20 DIAGNOSIS — Z880 Allergy status to penicillin: Secondary | ICD-10-CM | POA: Diagnosis not present

## 2014-12-20 DIAGNOSIS — K449 Diaphragmatic hernia without obstruction or gangrene: Secondary | ICD-10-CM | POA: Insufficient documentation

## 2014-12-20 HISTORY — PX: ESOPHAGOGASTRODUODENOSCOPY: SHX5428

## 2014-12-20 SURGERY — EGD (ESOPHAGOGASTRODUODENOSCOPY)
Anesthesia: Moderate Sedation

## 2014-12-20 MED ORDER — SODIUM CHLORIDE 0.9 % IV SOLN
INTRAVENOUS | Status: DC
  Administered 2014-12-20: 09:00:00 via INTRAVENOUS

## 2014-12-20 MED ORDER — MIDAZOLAM HCL 5 MG/5ML IJ SOLN
INTRAMUSCULAR | Status: DC | PRN
  Administered 2014-12-20: 1 mg via INTRAVENOUS
  Administered 2014-12-20 (×3): 2 mg via INTRAVENOUS

## 2014-12-20 MED ORDER — LIDOCAINE VISCOUS 2 % MT SOLN
OROMUCOSAL | Status: DC | PRN
  Administered 2014-12-20: 3 mL via OROMUCOSAL

## 2014-12-20 MED ORDER — MIDAZOLAM HCL 5 MG/5ML IJ SOLN
INTRAMUSCULAR | Status: AC
  Filled 2014-12-20: qty 10

## 2014-12-20 MED ORDER — MEPERIDINE HCL 100 MG/ML IJ SOLN
INTRAMUSCULAR | Status: AC
  Filled 2014-12-20: qty 2

## 2014-12-20 MED ORDER — LIDOCAINE VISCOUS 2 % MT SOLN
OROMUCOSAL | Status: AC
  Filled 2014-12-20: qty 15

## 2014-12-20 MED ORDER — MEPERIDINE HCL 100 MG/ML IJ SOLN
INTRAMUSCULAR | Status: DC | PRN
  Administered 2014-12-20 (×3): 25 mg via INTRAVENOUS

## 2014-12-20 MED ORDER — STERILE WATER FOR IRRIGATION IR SOLN
Status: DC | PRN
  Administered 2014-12-20: 10:00:00

## 2014-12-20 NOTE — Interval H&P Note (Signed)
History and Physical Interval Note:  12/20/2014 9:52 AM  Jerry Macdonald  has presented today for surgery, with the diagnosis of barretts  The various methods of treatment have been discussed with the patient and family. After consideration of risks, benefits and other options for treatment, the patient has consented to  Procedure(s) with comments: ESOPHAGOGASTRODUODENOSCOPY (EGD) (N/A) - 930  as a surgical intervention .  The patient's history has been reviewed, patient examined, no change in status, stable for surgery.  I have reviewed the patient's chart and labs.  Questions were answered to the patient's satisfaction.     [employer]

## 2014-12-20 NOTE — Progress Notes (Signed)
REVIEWED-NO ADDITIONAL RECOMMENDATIONS. 

## 2014-12-20 NOTE — H&P (View-Only) (Signed)
Referring Provider: [name], MD Primary Care Physician:  [name], MD  Primary GI: [name]   Chief Complaint  Patient presents with  . repeat EGD, Barretts    HPI:   Jerry Macdonald is a 81 y.o. male presenting today with a history of Barrett's esophagus, with EGD Sept 2015 noting indefinite dysplasia. For this reason, repeat early interval surveillance for April 2016 has been recommended.   Denies abdominal pain. No N/V. No GERD. No rectal bleeding, constipation or diarrhea. Recently diagnosed with prostate cancer. Prilosec BID.   Past Medical History  Diagnosis Date  . Sleep apnea   . Pyogenic granuloma     left cheeck  . Back pain     with radiculopathy  . Hyperlipidemia   . Seborrheic keratosis   . Obesity   . Malaise and fatigue   . Cataracts, bilateral   . Constipation   . Hypertrophy of prostate   . S/P endoscopy June 2011    Barrett's, mild gastritis and duodenitis  . S/P colonoscopy June 2011    rectal bleeding secondary to hemorrhoids  . S/P colonoscopy 2007    [facility], diverticulosis per Dr. Juel Burrow note    Past Surgical History  Procedure Laterality Date  . Hernia repair    . Varicose vein surgery    . Umbilical hernia repair    . Esophagogastroduodenoscopy  06/11/2011    Procedure: ESOPHAGOGASTRODUODENOSCOPY (EGD);  Surgeon: Dorothyann Peng, MD;  Location: [facility];  Service: Endoscopy;  Laterality: N/A;  11:00  . Colonoscopy  JUN 2011    PAN-COLONIC DIVERTICULOSIS  . Colonoscopy  2007    [facility]  . Esophagogastroduodenoscopy N/A 05/21/2014    Dr. Fields:Barrett's esophagus/ Medium sized hiatal hernia. path with indefinite dysplasia.     Current Outpatient Prescriptions  Medication Sig Dispense Refill  . MAGNESIUM-OXIDE PO Take 1 capsule by mouth daily.    . Multiple Vitamins-Minerals (MULTIVITAMINS THER. W/MINERALS) TABS Take 1 tablet by mouth daily.      Marland Kitchen omeprazole (PRILOSEC) 20 MG capsule 1 PO 30 MINS PRIOR TO MEALS BID 180  capsule 3  . tamsulosin (FLOMAX) 0.4 MG CAPS capsule Take 0.4 mg by mouth.    Marland Kitchen aspirin EC 81 MG tablet Take 81 mg by mouth daily.       No current facility-administered medications for this visit.    Allergies as of 12/01/2014 - Review Complete 12/01/2014  Allergen Reaction Noted  . Penicillins  07/07/2007    Family History  Problem Relation Age of Onset  . Colon polyps Neg Hx   . Colon cancer Neg Hx     History   Social History  . Marital Status: Divorced    Spouse Name: N/A  . Number of Children: N/A  . Years of Education: N/A   Social History Main Topics  . Smoking status: Former Smoker -- 0.50 packs/day    Types: Cigarettes  . Smokeless tobacco: Not on file     Comment: quit in 1965  . Alcohol Use: No  . Drug Use: No  . Sexual Activity: Not on file   Other Topics Concern  . None   Social History Narrative    Review of Systems: As mentioned in HPI.   Physical Exam: BP 127/68 mmHg  Pulse 58  Temp(Src) 96.7 F (35.9 C)  Ht 5\' 11"  (1.803 m)  Wt 221 lb 9.6 oz (100.517 kg)  BMI 30.92 kg/m2 General:   Alert and oriented. No  distress noted. Pleasant and cooperative.  Head:  Normocephalic and atraumatic. Eyes:  Conjuctiva clear without scleral icterus. Mouth:  Oral mucosa pink and moist. Good dentition. No lesions. Heart:  S1, S2 present without murmurs, rubs, or gallops. Regular rate and rhythm. Abdomen:  +BS, soft, non-tender and non-distended. No rebound or guarding. No HSM or masses noted. Obese.  Msk:  Symmetrical without gross deformities. Normal posture. Extremities:  Without edema. Neurologic:  Alert and  oriented x4;  grossly normal neurologically. Skin:  Intact without significant lesions or rashes. Psych:  Alert and cooperative. Normal mood and affect.

## 2014-12-20 NOTE — Op Note (Signed)
[facility] [address] [place], [address]   ENDOSCOPY PROCEDURE REPORT  PATIENT: Jerry Macdonald, Jerry Macdonald  MR#: [id number] BIRTHDATE: 1931/05/02 , 49  yrs. old GENDER: male  ENDOSCOPIST: Danie Binder, MD REFERRED SX:JDBZ Hall, M.D.  PROCEDURE DATE: 01/01/15 PROCEDURE:   EGD w/ biopsy  INDICATIONS:screening for Barrett's.SEP 2015-BARRET'S WITH INDETERMINATE DYSPLASIA 34 CM FROM THE TEETH. MEDICATIONS: Demerol 75 mg IV and Versed 7 mg IV TOPICAL ANESTHETIC:   Viscous Xylocaine ASA CLASS:  DESCRIPTION OF PROCEDURE:     Physical exam was performed.  Informed consent was obtained from the patient after explaining the benefits, risks, and alternatives to the procedure.  The patient was connected to the monitor and placed in the left lateral position.  Continuous oxygen was provided by nasal cannula and IV medicine administered through an indwelling cannula.  After administration of sedation, the patients esophagus was intubated and the EG-[id number] ([id number])  endoscope was advanced under direct visualization to the second portion of the duodenum.  The scope was removed slowly by carefully examining the color, texture, anatomy, and integrity of the mucosa on the way out.  The patient was recovered in endoscopy and discharged home in satisfactory condition.   ESOPHAGUS: There was a 6cm segment of Barrett's esophagus STARTING ARound 34 cm from the incisors AND EXTENDING TO GE JXN 40 CM FROM THE TEETH.  Multiple biopsies were performed using cold forceps 34, 36, & 38 CM FROM THE TEETH..   STOMACH: A large hiatal hernia was noted.   A small sessile polyp was found on the greater curvature of the gastric body.  Multiple biopsies was performed using cold forceps.   DUODENUM: The duodenal mucosa showed no abnormalities in the bulb and 2nd part of the duodenum. COMPLICATIONS: There were no immediate complications.  ENDOSCOPIC IMPRESSION: 1.  6cm segment of Barrett's  esophagus 2.   Large hiatal hernia 3.   Small GASTRIC polyp  RECOMMENDATIONS: TAKE OMEPRAZOLE 30 MINUTES PRIOR TO A MEAL TWICE DAILY. FOLLOW A LOW FAT DIET. AWAIT BIOPSY. FOLLOW UP IN [facility] 2017.  REPEAT EXAM:  eSigned:  Danie Binder, MD 2015-01-01 2:11 PM  CPT CODES: ICD CODES:  The ICD and CPT codes recommended by this software are interpretations from the data that the clinical staff has captured with the software.  The verification of the translation of this report to the ICD and CPT codes and modifiers is the sole responsibility of the health care institution and practicing physician where this report was generated.  [place]. will not be held responsible for the validity of the ICD and CPT codes included on this report.  AMA assumes no liability for data contained or not contained herein. CPT is a Designer, television/film set of the [employer].

## 2014-12-20 NOTE — Discharge Instructions (Signed)
You have BARRETT'S ESOPHAGUS. IT LOOKS THE SAME AS IT DID IN NOV 2015. YOU HAVE A LARGE HIATAL HERNIA & ONE STOMACH POLYP. I biopsied your ESOPHAGUS & stomach.   TAKE OMEPRAZOLE 30 MINUTES PRIOR TO A MEAL TWICE DAILY.  FOLLOW A LOW FAT DIET. SEE INFO BELOW.  YOUR BIOPSY RESULTS WILL BE AVAILABLE IN MY CHART AFTER APR 20 AND MY OFFICE WILL CONTACT YOU IN 10-14 DAYS WITH YOUR RESULTS.   FOLLOW UP IN [facility] 2017.     UPPER ENDOSCOPY AFTER CARE Read the instructions outlined below and refer to this sheet in the next week. These discharge instructions provide you with general information on caring for yourself after you leave the hospital. While your treatment has been planned according to the most current medical practices available, unavoidable complications occasionally occur. If you have any problems or questions after discharge, call DR. Mutasim Tuckey, [phone].  ACTIVITY  You may resume your regular activity, but move at a slower pace for the next 24 hours.   Take frequent rest periods for the next 24 hours.   Walking will help get rid of the air and reduce the bloated feeling in your belly (abdomen).   No driving for 24 hours (because of the medicine (anesthesia) used during the test).   You may shower.   Do not sign any important legal documents or operate any machinery for 24 hours (because of the anesthesia used during the test).    NUTRITION  Drink plenty of fluids.   You may resume your normal diet as instructed by your doctor.   Begin with a light meal and progress to your normal diet. Heavy or fried foods are harder to digest and may make you feel sick to your stomach (nauseated).   Avoid alcoholic beverages for 24 hours or as instructed.    MEDICATIONS  You may resume your normal medications.   WHAT YOU CAN EXPECT TODAY  Some feelings of bloating in the abdomen.   Passage of more gas than usual.    IF YOU HAD A BIOPSY TAKEN DURING THE UPPER  ENDOSCOPY:  Eat a soft diet IF YOU HAVE NAUSEA, BLOATING, ABDOMINAL PAIN, OR VOMITING.    FINDING OUT THE RESULTS OF YOUR TEST Not all test results are available during your visit. [name] WILL CALL YOU WITHIN 14 DAYS OF YOUR PROCEDUE WITH YOUR RESULTS. Do not assume everything is normal if you have not heard from DR. Dashay Giesler, CALL HER OFFICE AT [phone].  SEEK IMMEDIATE MEDICAL ATTENTION AND CALL THE OFFICE: [phone] IF:  You have more than a spotting of blood in your stool.   Your belly is swollen (abdominal distention).   You are nauseated or vomiting.   You have a temperature over 101F.   You have abdominal pain or discomfort that is severe or gets worse throughout the day.   Hiatal Hernia A hiatal hernia occurs when a part of the stomach slides above the diaphragm. The diaphragm is the thin muscle separating the belly (abdomen) from the chest. A hiatal hernia can be something you are born with or develop over time. Hiatal hernias may allow stomach acid to flow back into your esophagus, the tube which carries food from your mouth to your stomach. If this acid causes problems it is called GERD (gastro-esophageal reflux disease).   SYMPTOMS Common symptoms of GERD are heartburn (burning in your chest). This is worse when lying down or bending over. It may also cause belching and indigestion. Some of  the things which make GERD worse are:  Increased weight pushes on stomach making acid rise more easily.   Smoking markedly increases acid production.   Alcohol decreases lower esophageal sphincter pressure (valve between stomach and esophagus), allowing acid from stomach into esophagus.   Late evening meals and going to bed with a full stomach increases pressure.   Anything that causes an increase in acid production.   Lower esophageal sphincter incompetence.    HOME CARE INSTRUCTIONS  Try to achieve and maintain an ideal body weight.   Avoid drinking alcoholic  beverages.   Stop smoking.   Put the head of your bed on 4 to 6 inch blocks. This will keep your head and esophagus higher than your stomach. If you cannot use blocks, sleep with several pillows under your head and shoulders.   MINIMIZE THE USE OF aspirin, ibuprofen (Advil or Motrin), or other nonsteroidal anti-inflammatory drugs.   Do not wear tight clothing around your chest or stomach.   Eat smaller meals and eat more frequently. This keeps your stomach from getting too full. Eat slowly.   Do not lie down for 2 or 3 hours after eating. Do not eat or drink anything 1 to 2 hours before going to bed.   Avoid caffeine beverages (colas, coffee, cocoa, tea), fatty foods, citrus fruits and all other foods and drinks that contain acid and that seem to increase the problems.   Avoid bending over, especially after eating. Also avoid straining during bowel movements or when urinating or lifting things. Anything that increases the pressure in your belly increases the amount of acid that may be pushed up into your esophagus.    Low-Fat Diet  BREADS, CEREALS, PASTA, RICE, DRIED PEAS, AND BEANS These products are high in carbohydrates and most are low in fat. Therefore, they can be increased in the diet as substitutes for fatty foods. They too, however, contain calories and should not be eaten in excess. Cereals can be eaten for snacks as well as for breakfast.  Include foods that contain fiber (fruits, vegetables, whole grains, and legumes). Research shows that fiber may lower blood cholesterol levels, especially the water-soluble fiber found in fruits, vegetables, oat products, and legumes.  FRUITS AND VEGETABLES It is good to eat fruits and vegetables. Besides being sources of fiber, both are rich in vitamins and some minerals. They help you get the daily allowances of these nutrients. Fruits and vegetables can be used for snacks and desserts.  MEATS Limit lean meat, chicken, [place], and fish  to no more than 6 ounces per day.  Beef, Pork, and Lamb Use lean cuts of beef, pork, and lamb. Lean cuts include:  Extra-lean ground beef.  Arm roast.  Sirloin tip.  Center-cut ham.  Round steak.  Loin chops.  Rump roast.  Tenderloin.  Trim all fat off the outside of meats before cooking. It is not necessary to severely decrease the intake of red meat, but lean choices should be made. Lean meat is rich in protein and contains a highly absorbable form of iron. Premenopausal women, in particular, should avoid reducing lean red meat because this could increase the risk for low red blood cells (iron-deficiency anemia).  Chicken and [place] These are good sources of protein. The fat of poultry can be reduced by removing the skin and underlying fat layers before cooking. Chicken and [place] can be substituted for lean red meat in the diet. Poultry should not be fried or covered with high-fat sauces.  Fish and Shellfish Fish is a good source of protein. Shellfish contain cholesterol, but they usually are low in saturated fatty acids. The preparation of fish is important. Like chicken and [place], they should not be fried or covered with high-fat sauces.  EGGS Egg whites contain no fat or cholesterol. They can be eaten often. Try 1 to 2 egg whites instead of whole eggs in recipes or use egg substitutes that do not contain yolk.  MILK AND DAIRY PRODUCTS Use skim or 1% milk instead of 2% or whole milk. Decrease whole milk, natural, and processed cheeses. Use nonfat or low-fat (2%) cottage cheese or low-fat cheeses made from vegetable oils. Choose nonfat or low-fat (1 to 2%) yogurt. Experiment with evaporated skim milk in recipes that call for heavy cream. Substitute low-fat yogurt or low-fat cottage cheese for sour cream in dips and salad dressings. Have at least 2 servings of low-fat dairy products, such as 2 glasses of skim (or 1%) milk each day to help get your daily calcium intake.  FATS AND  OILS Reduce the total intake of fats, especially saturated fat. Butterfat, lard, and beef fats are high in saturated fat and cholesterol. These should be avoided as much as possible. Vegetable fats do not contain cholesterol, but certain vegetable fats, such as coconut oil, palm oil, and palm kernel oil are very high in saturated fats. These should be limited. These fats are often used in bakery goods, processed foods, popcorn, oils, and nondairy creamers. Vegetable shortenings and some peanut butters contain hydrogenated oils, which are also saturated fats. Read the labels on these foods and check for saturated vegetable oils.  Unsaturated vegetable oils and fats do not raise blood cholesterol. However, they should be limited because they are fats and are high in calories. Total fat should still be limited to 30% of your daily caloric intake. Desirable liquid vegetable oils are corn oil, cottonseed oil, olive oil, canola oil, safflower oil, soybean oil, and sunflower oil. Peanut oil is not as good, but small amounts are acceptable. Buy a heart-healthy tub margarine that has no partially hydrogenated oils in the ingredients. Mayonnaise and salad dressings often are made from unsaturated fats, but they should also be limited because of their high calorie and fat content. Seeds, nuts, peanut butter, olives, and avocados are high in fat, but the fat is mainly the unsaturated type. These foods should be limited mainly to avoid excess calories and fat.  OTHER EATING TIPS Snacks  Most sweets should be limited as snacks. They tend to be rich in calories and fats, and their caloric content outweighs their nutritional value. Some good choices in snacks are graham crackers, melba toast, soda crackers, bagels (no egg), English muffins, fruits, and vegetables. These snacks are preferable to snack crackers, [place] fries, and chips. Popcorn should be air-popped or cooked in small amounts of liquid vegetable  oil.  Desserts Eat fruit, low-fat yogurt, and fruit ices instead of pastries, cake, and cookies. Sherbet, angel food cake, gelatin dessert, frozen low-fat yogurt, or other frozen products that do not contain saturated fat (pure fruit juice bars, frozen ice pops) are also acceptable.   COOKING METHODS Choose those methods that use little or no fat. They include: Poaching.  Braising.  Steaming.  Grilling.  Baking.  Stir-frying.  Broiling.  Microwaving.  Foods can be cooked in a nonstick pan without added fat, or use a nonfat cooking spray in regular cookware. Limit fried foods and avoid frying in saturated fat. Add moisture  to lean meats by using water, broth, cooking wines, and other nonfat or low-fat sauces along with the cooking methods mentioned above. Soups and stews should be chilled after cooking. The fat that forms on top after a few hours in the refrigerator should be skimmed off. When preparing meals, avoid using excess salt. Salt can contribute to raising blood pressure in some people.  EATING AWAY FROM HOME Order entres, potatoes, and vegetables without sauces or butter. When meat exceeds the size of a deck of cards (3 to 4 ounces), the rest can be taken home for another meal. Choose vegetable or fruit salads and ask for low-calorie salad dressings to be served on the side. Use dressings sparingly. Limit high-fat toppings, such as bacon, crumbled eggs, cheese, sunflower seeds.

## 2014-12-22 ENCOUNTER — Encounter (HOSPITAL_COMMUNITY): Payer: Self-pay | Admitting: Gastroenterology

## 2014-12-24 DIAGNOSIS — Z125 Encounter for screening for malignant neoplasm of prostate: Secondary | ICD-10-CM | POA: Diagnosis not present

## 2014-12-24 DIAGNOSIS — I10 Essential (primary) hypertension: Secondary | ICD-10-CM | POA: Diagnosis not present

## 2014-12-24 DIAGNOSIS — R7301 Impaired fasting glucose: Secondary | ICD-10-CM | POA: Diagnosis not present

## 2014-12-24 DIAGNOSIS — E782 Mixed hyperlipidemia: Secondary | ICD-10-CM | POA: Diagnosis not present

## 2014-12-24 DIAGNOSIS — N429 Disorder of prostate, unspecified: Secondary | ICD-10-CM | POA: Diagnosis not present

## 2014-12-24 DIAGNOSIS — D509 Iron deficiency anemia, unspecified: Secondary | ICD-10-CM | POA: Diagnosis not present

## 2014-12-27 DIAGNOSIS — C61 Malignant neoplasm of prostate: Secondary | ICD-10-CM | POA: Diagnosis not present

## 2014-12-27 DIAGNOSIS — R7301 Impaired fasting glucose: Secondary | ICD-10-CM | POA: Diagnosis not present

## 2014-12-27 DIAGNOSIS — Z6830 Body mass index (BMI) 30.0-30.9, adult: Secondary | ICD-10-CM | POA: Diagnosis not present

## 2014-12-27 DIAGNOSIS — K2270 Barrett's esophagus without dysplasia: Secondary | ICD-10-CM | POA: Diagnosis not present

## 2015-01-03 ENCOUNTER — Telehealth: Payer: Self-pay | Admitting: Gastroenterology

## 2015-01-03 NOTE — Telephone Encounter (Signed)
Pt is aware of results. 

## 2015-01-03 NOTE — Telephone Encounter (Signed)
Please call pt. The biopsies show Barrett's Esophagus. IT DID NOT SHOW DYSPLASIA.  Repeat EGD in 1 year.

## 2015-01-04 NOTE — Telephone Encounter (Signed)
Reminder in epic to have EGD in one year

## 2015-01-07 ENCOUNTER — Ambulatory Visit (INDEPENDENT_AMBULATORY_CARE_PROVIDER_SITE_OTHER): Payer: Medicare Other | Admitting: Urology

## 2015-01-07 DIAGNOSIS — C61 Malignant neoplasm of prostate: Secondary | ICD-10-CM | POA: Diagnosis not present

## 2015-01-07 DIAGNOSIS — N401 Enlarged prostate with lower urinary tract symptoms: Secondary | ICD-10-CM | POA: Diagnosis not present

## 2015-03-04 DIAGNOSIS — H2513 Age-related nuclear cataract, bilateral: Secondary | ICD-10-CM | POA: Diagnosis not present

## 2015-03-04 DIAGNOSIS — H02831 Dermatochalasis of right upper eyelid: Secondary | ICD-10-CM | POA: Diagnosis not present

## 2015-03-04 DIAGNOSIS — H02834 Dermatochalasis of left upper eyelid: Secondary | ICD-10-CM | POA: Diagnosis not present

## 2015-03-24 ENCOUNTER — Ambulatory Visit (INDEPENDENT_AMBULATORY_CARE_PROVIDER_SITE_OTHER): Payer: Medicare Other | Admitting: Otolaryngology

## 2015-03-24 DIAGNOSIS — H6123 Impacted cerumen, bilateral: Secondary | ICD-10-CM | POA: Diagnosis not present

## 2015-04-08 DIAGNOSIS — C61 Malignant neoplasm of prostate: Secondary | ICD-10-CM | POA: Diagnosis not present

## 2015-04-15 ENCOUNTER — Ambulatory Visit (INDEPENDENT_AMBULATORY_CARE_PROVIDER_SITE_OTHER): Payer: Medicare Other | Admitting: Urology

## 2015-04-15 DIAGNOSIS — N401 Enlarged prostate with lower urinary tract symptoms: Secondary | ICD-10-CM

## 2015-04-15 DIAGNOSIS — C61 Malignant neoplasm of prostate: Secondary | ICD-10-CM | POA: Diagnosis not present

## 2015-04-15 DIAGNOSIS — R972 Elevated prostate specific antigen [PSA]: Secondary | ICD-10-CM

## 2015-04-15 DIAGNOSIS — R3915 Urgency of urination: Secondary | ICD-10-CM

## 2015-05-02 DIAGNOSIS — R7301 Impaired fasting glucose: Secondary | ICD-10-CM | POA: Diagnosis not present

## 2015-05-02 DIAGNOSIS — E782 Mixed hyperlipidemia: Secondary | ICD-10-CM | POA: Diagnosis not present

## 2015-05-06 DIAGNOSIS — Z23 Encounter for immunization: Secondary | ICD-10-CM | POA: Diagnosis not present

## 2015-05-06 DIAGNOSIS — C61 Malignant neoplasm of prostate: Secondary | ICD-10-CM | POA: Diagnosis not present

## 2015-05-06 DIAGNOSIS — K2270 Barrett's esophagus without dysplasia: Secondary | ICD-10-CM | POA: Diagnosis not present

## 2015-05-06 DIAGNOSIS — R7301 Impaired fasting glucose: Secondary | ICD-10-CM | POA: Diagnosis not present

## 2015-05-06 DIAGNOSIS — E669 Obesity, unspecified: Secondary | ICD-10-CM | POA: Diagnosis not present

## 2015-05-11 ENCOUNTER — Other Ambulatory Visit: Payer: Self-pay | Admitting: Gastroenterology

## 2015-07-01 DIAGNOSIS — C61 Malignant neoplasm of prostate: Secondary | ICD-10-CM | POA: Diagnosis not present

## 2015-07-08 ENCOUNTER — Ambulatory Visit (INDEPENDENT_AMBULATORY_CARE_PROVIDER_SITE_OTHER): Payer: Medicare Other | Admitting: Urology

## 2015-07-08 DIAGNOSIS — C61 Malignant neoplasm of prostate: Secondary | ICD-10-CM | POA: Diagnosis not present

## 2015-07-08 DIAGNOSIS — R3915 Urgency of urination: Secondary | ICD-10-CM

## 2015-07-08 DIAGNOSIS — N401 Enlarged prostate with lower urinary tract symptoms: Secondary | ICD-10-CM | POA: Diagnosis not present

## 2015-07-08 DIAGNOSIS — R972 Elevated prostate specific antigen [PSA]: Secondary | ICD-10-CM

## 2015-07-11 ENCOUNTER — Telehealth: Payer: Self-pay | Admitting: Gastroenterology

## 2015-07-11 NOTE — Telephone Encounter (Signed)
PATIENT NEEDS REFILL OF OMEPRAZOLE CALLED INTO [facility]

## 2015-07-12 MED ORDER — OMEPRAZOLE 20 MG PO CPDR: DELAYED_RELEASE_CAPSULE | ORAL | Status: DC

## 2015-07-12 NOTE — Telephone Encounter (Signed)
Routing to refill box  

## 2015-07-12 NOTE — Addendum Note (Signed)
Addended by: Orvil Feil on: 07/12/2015 11:23 AM   Modules accepted: Orders

## 2015-07-12 NOTE — Telephone Encounter (Signed)
Pt is aware.  

## 2015-07-12 NOTE — Telephone Encounter (Signed)
Completed.

## 2015-09-22 ENCOUNTER — Ambulatory Visit (INDEPENDENT_AMBULATORY_CARE_PROVIDER_SITE_OTHER): Payer: Medicare Other | Admitting: Otolaryngology

## 2015-09-22 DIAGNOSIS — H6123 Impacted cerumen, bilateral: Secondary | ICD-10-CM

## 2015-09-22 DIAGNOSIS — H903 Sensorineural hearing loss, bilateral: Secondary | ICD-10-CM | POA: Diagnosis not present

## 2015-10-07 DIAGNOSIS — C61 Malignant neoplasm of prostate: Secondary | ICD-10-CM | POA: Diagnosis not present

## 2015-10-28 ENCOUNTER — Ambulatory Visit (INDEPENDENT_AMBULATORY_CARE_PROVIDER_SITE_OTHER): Payer: Medicare Other | Admitting: Urology

## 2015-10-28 DIAGNOSIS — C61 Malignant neoplasm of prostate: Secondary | ICD-10-CM

## 2015-10-28 DIAGNOSIS — N401 Enlarged prostate with lower urinary tract symptoms: Secondary | ICD-10-CM

## 2015-10-28 DIAGNOSIS — R3915 Urgency of urination: Secondary | ICD-10-CM | POA: Diagnosis not present

## 2015-10-28 DIAGNOSIS — R972 Elevated prostate specific antigen [PSA]: Secondary | ICD-10-CM

## 2015-11-07 DIAGNOSIS — E782 Mixed hyperlipidemia: Secondary | ICD-10-CM | POA: Diagnosis not present

## 2015-11-07 DIAGNOSIS — R7301 Impaired fasting glucose: Secondary | ICD-10-CM | POA: Diagnosis not present

## 2015-11-11 DIAGNOSIS — K2270 Barrett's esophagus without dysplasia: Secondary | ICD-10-CM | POA: Diagnosis not present

## 2015-11-11 DIAGNOSIS — R7301 Impaired fasting glucose: Secondary | ICD-10-CM | POA: Diagnosis not present

## 2015-11-11 DIAGNOSIS — E6609 Other obesity due to excess calories: Secondary | ICD-10-CM | POA: Diagnosis not present

## 2015-11-11 DIAGNOSIS — C61 Malignant neoplasm of prostate: Secondary | ICD-10-CM | POA: Diagnosis not present

## 2015-11-11 DIAGNOSIS — Z6829 Body mass index (BMI) 29.0-29.9, adult: Secondary | ICD-10-CM | POA: Diagnosis not present

## 2015-11-11 DIAGNOSIS — E782 Mixed hyperlipidemia: Secondary | ICD-10-CM | POA: Diagnosis not present

## 2015-12-13 ENCOUNTER — Encounter: Payer: Self-pay | Admitting: Gastroenterology

## 2016-01-06 ENCOUNTER — Other Ambulatory Visit: Payer: Self-pay

## 2016-01-06 ENCOUNTER — Encounter: Payer: Self-pay | Admitting: Gastroenterology

## 2016-01-06 ENCOUNTER — Ambulatory Visit (INDEPENDENT_AMBULATORY_CARE_PROVIDER_SITE_OTHER): Payer: Medicare Other | Admitting: Gastroenterology

## 2016-01-06 VITALS — BP 123/75 | HR 70 | Temp 96.5°F | Ht 70.5 in | Wt 213.6 lb

## 2016-01-06 DIAGNOSIS — K2270 Barrett's esophagus without dysplasia: Secondary | ICD-10-CM

## 2016-01-06 NOTE — Patient Instructions (Signed)
We have scheduled you for an upper endoscopy with [name] in the near future.   Have a great weekend!

## 2016-01-06 NOTE — Progress Notes (Signed)
Referring Provider: [name], MD Primary Care Physician:  Wende Neighbors, MD  Primary GI: [name]   Chief Complaint  Patient presents with  . Follow-up    No complaints    HPI:   Jerry Macdonald is an 82 y.o. male presenting today with a history of Barrett's esophagus, EGD in 2015 with indefinite dysplasia and 2016 with no dysplasias. 1 year surveillance due now.  No constipation, diarrhea, rectal bleeding. No overt GI bleeding. No nausea, vomiting. GERD controlled. No dysphagia. Doing quite well.   Past Medical History  Diagnosis Date  . Sleep apnea   . Pyogenic granuloma     left cheeck  . Back pain     with radiculopathy  . Hyperlipidemia   . Seborrheic keratosis   . Obesity   . Malaise and fatigue   . Cataracts, bilateral   . Constipation   . Hypertrophy of prostate   . S/P endoscopy June 2011    Barrett's, mild gastritis and duodenitis  . S/P colonoscopy June 2011    rectal bleeding secondary to hemorrhoids  . S/P colonoscopy 2007    [facility], diverticulosis per Dr. Juel Burrow note  . Prostate cancer [facility])     Past Surgical History  Procedure Laterality Date  . Hernia repair    . Varicose vein surgery    . Umbilical hernia repair    . Esophagogastroduodenoscopy  06/11/2011    Procedure: ESOPHAGOGASTRODUODENOSCOPY (EGD);  Surgeon: Dorothyann Peng, MD;  Location[facility];  Service: Endoscopy;  Laterality: N/A;  11:00  . Colonoscopy  JUN 2011    PAN-COLONIC DIVERTICULOSIS  . Colonoscopy  2007    [place] [facility]  . Esophagogastroduodenoscopy N/A 05/21/2014    Dr. Fields:Barrett's esophagus/ Medium sized hiatal hernia. path with indefinite dysplasia.   . Esophagogastroduodenoscopy N/A 12/20/2014    Dr. Clayburn Pert gastric polyp/large HH/6 cm segment of Barrett's esophagus, path with hyperplastic gastric polyp, no dysplasia. Surveillance April 2017 due     Current Outpatient Prescriptions  Medication Sig Dispense Refill  . magnesium oxide (MAG-OX) 400 MG  tablet Take 400 mg by mouth daily.    . Multiple Vitamins-Minerals (MULTIVITAMINS THER. W/MINERALS) TABS Take 1 tablet by mouth daily.      Marland Kitchen omeprazole (PRILOSEC) 20 MG capsule TAKE 1 CAPSULE 30 MINUTES PRIOR TO MEALS TWICE A DAY 180 capsule 3  . tamsulosin (FLOMAX) 0.4 MG CAPS capsule Take 0.4 mg by mouth daily.      No current facility-administered medications for this visit.    Allergies as of 01/06/2016 - Review Complete 12/20/2014  Allergen Reaction Noted  . Penicillins  07/07/2007    Family History  Problem Relation Age of Onset  . Colon polyps Neg Hx   . Colon cancer Neg Hx     Social History   Social History  . Marital Status: Divorced    Spouse Name: N/A  . Number of Children: N/A  . Years of Education: N/A   Social History Main Topics  . Smoking status: Former Smoker -- 0.50 packs/day    Types: Cigarettes  . Smokeless tobacco: None     Comment: quit in 1965  . Alcohol Use: No  . Drug Use: No  . Sexual Activity: Not Asked   Other Topics Concern  . None   Social History Narrative    Review of Systems: Gen: Denies fever, chills, anorexia. Denies fatigue, weakness, weight loss.  CV: Denies chest pain, palpitations, syncope, peripheral edema, and claudication.  Resp: Denies dyspnea at rest, cough, wheezing, coughing up blood, and pleurisy. GI: Denies vomiting blood, jaundice, and fecal incontinence.   Denies dysphagia or odynophagia. Derm: Denies rash, itching, dry skin MSK: arthritis  Psych: Denies depression, anxiety, memory loss, confusion. No homicidal or suicidal ideation.  Heme: Denies bruising, bleeding, and enlarged lymph nodes.  Physical Exam: BP 123/75 mmHg  Pulse 70  Temp(Src) 96.5 F (35.8 C) (Oral)  Ht 5' 10.5" (1.791 m)  Wt 213 lb 9.6 oz (96.888 kg)  BMI 30.20 kg/m2 General:   Alert and oriented. No distress noted. Pleasant and cooperative.  Head:  Normocephalic and atraumatic. Eyes:  Conjuctiva clear without scleral icterus. Mouth:   Oral mucosa pink and moist. Good dentition. No lesions. Heart:  S1, S2 present without murmurs, rubs, or gallops. Regular rate and rhythm. Abdomen:  +BS, soft, non-tender and non-distended. Umbilical hernia noted.  Msk:  Symmetrical without gross deformities. Normal posture. Extremities:  Without edema. Neurologic:  Alert and  oriented x4;  grossly normal neurologically. Psych:  Alert and cooperative. Normal mood and affect.

## 2016-01-06 NOTE — Assessment & Plan Note (Signed)
82 year old male with history of Barrett's and indefinite dysplasia in 2015, no dysplasia in 2016, and due for 1 year surveillance now. Absolutely no concerning lower or upper GI symptoms. Remains on PPI BID.   Proceed with upper endoscopy in the near future with [name]. The risks, benefits, and alternatives have been discussed in detail with patient. They have stated understanding and desire to proceed.

## 2016-01-09 ENCOUNTER — Telehealth: Payer: Self-pay

## 2016-01-09 NOTE — Telephone Encounter (Signed)
That's a new thing. May have a virus. Soft foods, bland, drink plenty of liquids. If continues through today into tomorrow, could do stool studies, but I would give it at least 24-48 hours to see how he does. May take kaopectate.

## 2016-01-09 NOTE — Telephone Encounter (Signed)
Jerry Macdonald (pt friend) called and states that pt has had diarrhea all night and wants to know what he can do to stop it.  States pt had a small breakfast this morning but is still going to restroom

## 2016-01-09 NOTE — Progress Notes (Signed)
CC'D TO PCP °

## 2016-01-09 NOTE — Telephone Encounter (Signed)
Called Izora Gala and [facility] to call office back

## 2016-01-10 NOTE — Telephone Encounter (Signed)
Pt is aware and is better today

## 2016-01-20 ENCOUNTER — Encounter (HOSPITAL_COMMUNITY): Payer: Self-pay | Admitting: *Deleted

## 2016-01-20 ENCOUNTER — Ambulatory Visit (HOSPITAL_COMMUNITY)
Admission: RE | Admit: 2016-01-20 | Discharge: 2016-01-20 | Disposition: A | Discharge status: Home / Self Care | Payer: Medicare Other | Source: Ambulatory Visit | Attending: Gastroenterology | Admitting: Gastroenterology

## 2016-01-20 ENCOUNTER — Encounter (HOSPITAL_COMMUNITY)
Admission: RE | Disposition: A | Discharge status: Home / Self Care | Payer: Self-pay | Source: Ambulatory Visit | Attending: Gastroenterology

## 2016-01-20 DIAGNOSIS — E669 Obesity, unspecified: Secondary | ICD-10-CM | POA: Diagnosis not present

## 2016-01-20 DIAGNOSIS — Z13810 Encounter for screening for upper gastrointestinal disorder: Secondary | ICD-10-CM | POA: Diagnosis not present

## 2016-01-20 DIAGNOSIS — K2270 Barrett's esophagus without dysplasia: Secondary | ICD-10-CM | POA: Insufficient documentation

## 2016-01-20 DIAGNOSIS — Z6830 Body mass index (BMI) 30.0-30.9, adult: Secondary | ICD-10-CM | POA: Insufficient documentation

## 2016-01-20 DIAGNOSIS — G4730 Sleep apnea, unspecified: Secondary | ICD-10-CM | POA: Insufficient documentation

## 2016-01-20 DIAGNOSIS — E785 Hyperlipidemia, unspecified: Secondary | ICD-10-CM | POA: Insufficient documentation

## 2016-01-20 HISTORY — PX: ESOPHAGOGASTRODUODENOSCOPY: SHX5428

## 2016-01-20 SURGERY — EGD (ESOPHAGOGASTRODUODENOSCOPY)
Anesthesia: Moderate Sedation

## 2016-01-20 MED ORDER — LIDOCAINE VISCOUS 2 % MT SOLN
OROMUCOSAL | Status: DC | PRN
  Administered 2016-01-20: 5 mL via OROMUCOSAL

## 2016-01-20 MED ORDER — SODIUM CHLORIDE 0.9 % IV SOLN
INTRAVENOUS | Status: DC
  Administered 2016-01-20: 12:00:00 via INTRAVENOUS

## 2016-01-20 MED ORDER — MEPERIDINE HCL 100 MG/ML IJ SOLN
INTRAMUSCULAR | Status: DC | PRN
  Administered 2016-01-20: 50 mg via INTRAVENOUS
  Administered 2016-01-20: 25 mg via INTRAVENOUS

## 2016-01-20 MED ORDER — MEPERIDINE HCL 100 MG/ML IJ SOLN
INTRAMUSCULAR | Status: AC
  Filled 2016-01-20: qty 2

## 2016-01-20 MED ORDER — MIDAZOLAM HCL 5 MG/5ML IJ SOLN
INTRAMUSCULAR | Status: DC | PRN
  Administered 2016-01-20 (×2): 2 mg via INTRAVENOUS
  Administered 2016-01-20: 1 mg via INTRAVENOUS

## 2016-01-20 MED ORDER — LIDOCAINE VISCOUS 2 % MT SOLN
OROMUCOSAL | Status: AC
  Filled 2016-01-20: qty 15

## 2016-01-20 MED ORDER — MIDAZOLAM HCL 5 MG/5ML IJ SOLN
INTRAMUSCULAR | Status: AC
  Filled 2016-01-20: qty 10

## 2016-01-20 MED ORDER — STERILE WATER FOR IRRIGATION IR SOLN
Status: DC | PRN
  Administered 2016-01-20: 13:00:00

## 2016-01-20 NOTE — Discharge Instructions (Signed)
You have BARRETT'S ESOPHAGUS. IT LOOKS THE SAME AS IT DID IN 2016. YOU HAVE A HIATAL HERNIA. I biopsied your ESOPHAGUS.   CONTINUE OMEPRAZOLE.  TAKE 30 MINUTES PRIOR TO YOUR MEALS TWICE DAILY.  REPEAT EGD IN 1-3 YEARS.   UPPER ENDOSCOPY AFTER CARE Read the instructions outlined below and refer to this sheet in the next week. These discharge instructions provide you with general information on caring for yourself after you leave the hospital. While your treatment has been planned according to the most current medical practices available, unavoidable complications occasionally occur. If you have any problems or questions after discharge, call DR. Tyffani Foglesong, [phone].  ACTIVITY  You may resume your regular activity, but move at a slower pace for the next 24 hours.   Take frequent rest periods for the next 24 hours.   Walking will help get rid of the air and reduce the bloated feeling in your belly (abdomen).   No driving for 24 hours (because of the medicine (anesthesia) used during the test).   You may shower.   Do not sign any important legal documents or operate any machinery for 24 hours (because of the anesthesia used during the test).    NUTRITION  Drink plenty of fluids.   You may resume your normal diet as instructed by your doctor.   Begin with a light meal and progress to your normal diet. Heavy or fried foods are harder to digest and may make you feel sick to your stomach (nauseated).   Avoid alcoholic beverages for 24 hours or as instructed.    MEDICATIONS  You may resume your normal medications.   WHAT YOU CAN EXPECT TODAY  Some feelings of bloating in the abdomen.   Passage of more gas than usual.    IF YOU HAD A BIOPSY TAKEN DURING THE UPPER ENDOSCOPY:  Eat a soft diet IF YOU HAVE NAUSEA, BLOATING, ABDOMINAL PAIN, OR VOMITING.    FINDING OUT THE RESULTS OF YOUR TEST Not all test results are available during your visit. DR. Darrick Penna WILL CALL YOU  WITHIN 7 DAYS OF YOUR PROCEDUE WITH YOUR RESULTS. Do not assume everything is normal if you have not heard from DR. Wm Fruchter IN ONE WEEK, CALL HER OFFICE AT [phone].  SEEK IMMEDIATE MEDICAL ATTENTION AND CALL THE OFFICE: [phone] IF:  You have more than a spotting of blood in your stool.   Your belly is swollen (abdominal distention).   You are nauseated or vomiting.   You have a temperature over 101F.   You have abdominal pain or discomfort that is severe or gets worse throughout the day.   Hiatal Hernia A hiatal hernia occurs when a part of the stomach slides above the diaphragm. The diaphragm is the thin muscle separating the belly (abdomen) from the chest. A hiatal hernia can be something you are born with or develop over time. Hiatal hernias may allow stomach acid to flow back into your esophagus, the tube which carries food from your mouth to your stomach. If this acid causes problems it is called GERD (gastro-esophageal reflux disease).   SYMPTOMS Common symptoms of GERD are heartburn (burning in your chest). This is worse when lying down or bending over. It may also cause belching and indigestion. Some of the things which make GERD worse are:  Increased weight pushes on stomach making acid rise more easily.   Smoking markedly increases acid production.   Alcohol decreases lower esophageal sphincter pressure (valve between stomach and esophagus), allowing acid  from stomach into esophagus.   Late evening meals and going to bed with a full stomach increases pressure.   HOME CARE INSTRUCTIONS  Try to achieve and maintain an ideal body weight.   Avoid drinking alcoholic beverages.   Stop smoking.   Put the head of your bed on 4 to 6 inch blocks. This will keep your head and esophagus higher than your stomach. If you cannot use blocks, sleep with several pillows under your head and shoulders.   MINIMIZE THE USE OF aspirin, ibuprofen (Advil or Motrin), or other  nonsteroidal anti-inflammatory drugs.   Do not wear tight clothing around your chest or stomach.   Eat smaller meals and eat more frequently. This keeps your stomach from getting too full. Eat slowly.   Do not lie down for 2 or 3 hours after eating. Do not eat or drink anything 1 to 2 hours before going to bed.   Avoid caffeine beverages (colas, coffee, cocoa, tea), fatty foods, citrus fruits and all other foods and drinks that contain acid and that seem to increase the problems.   Avoid bending over, especially after eating. Also avoid straining during bowel movements or when urinating or lifting things. Anything that increases the pressure in your belly increases the amount of acid that may be pushed up into your esophagus.

## 2016-01-20 NOTE — Op Note (Signed)
[facility] Patient Name: Jerry Macdonald Procedure Date: 01/20/2016 12:42 PM MRN: [id number] Date of Birth: 1931/08/30 Attending MD: Barney Drain , MD CSN: [id number] Age: 82 Admit Type: Outpatient Procedure:                Upper GI endoscopy Indications:              Screening for Barrett's esophagus-LAST EGD APR 2016                            WITH INDETERMINATE DYSPLASIA Providers:                Barney Drain, MD, Rosina Lowenstein, RN, Georgeann Oppenheim,                            Technician Referring MD:             [name], MD Medicines:                Meperidine 75 mg IV, Midazolam 5 mg IV Complications:            No immediate complications. Estimated Blood Loss:     Estimated blood loss was minimal. Procedure:                Pre-Anesthesia Assessment:                           - Prior to the procedure, a History and Physical                            was performed, and patient medications and                            allergies were reviewed. The patient's tolerance of                            previous anesthesia was also reviewed. The risks                            and benefits of the procedure and the sedation                            options and risks were discussed with the patient.                            All questions were answered, and informed consent                            was obtained. Prior Anticoagulants: The patient has                            taken no previous anticoagulant or antiplatelet                            agents. ASA Grade Assessment: II - A patient with  mild systemic disease. After reviewing the risks                            and benefits, the patient was deemed in                            satisfactory condition to undergo the procedure.                           After obtaining informed consent, the endoscope was                            passed under direct vision. Throughout the         procedure, the patient's blood pressure, pulse, and                            oxygen saturations were monitored continuously. The                            [id number]) scope was introduced through the                            mouth, and advanced to the second part of duodenum.                            The patient tolerated the procedure well. The upper                            GI endoscopy was somewhat difficult due to the                            patient's oxygen desaturation. Successful                            completion of the procedure was aided by performing                            chin lift. Scope In: 1:04:04 PM Scope Out: 1:17:36 PM Total Procedure Duration: 0 hours 13 minutes 32 seconds  Findings:      The esophagus and gastroesophageal junction were examined with white       light. There were esophageal mucosal changes consistent with       long-segment Barrett's esophagus. These changes involved the mucosa at       the upper extent of the gastric folds (34 cm from the incisors)       extending to the Z-line (40 cm from the incisors). Circumferential       salmon-colored mucosa was present from 34 to 40 cm. The maximum       longitudinal extent of these esophageal mucosal changes was 6 cm in       length. Mucosa was biopsied with a cold forceps for histology in 4       quadrants at intervals of 2 cm from 34 to 40 cm from the incisors. A       total of  3 specimen bottles were sent to pathology.      The entire examined stomach was normal.      The duodenal bulb and second portion of the duodenum were normal. Impression:               - Long-segment Barrett's esophagus. Moderate Sedation:      Moderate (conscious) sedation was administered by the endoscopy nurse       and supervised by the endoscopist. The following parameters were       monitored: oxygen saturation, heart rate, blood pressure, and response       to care. Total physician intraservice  time was 28 minutes. Recommendation:           - Patient has a contact number available for                            emergencies. The signs and symptoms of potential                            delayed complications were discussed with the                            patient. Return to normal activities tomorrow.                            Written discharge instructions were provided to the                            patient.                           - Low fat diet.                           - Continue present medications.                           - Await pathology results.                           - Repeat upper endoscopy in 1 year for                            surveillance. CONSIDER REFERRAL TO [facility] FOR EGD                            WITH NBI. Procedure Code(s):        --- Professional ---                           [phone], Esophagogastroduodenoscopy, flexible,                            transoral; with biopsy, single or multiple                           99153, Moderate sedation services; each additional  15 minutes intraservice time                           G0500, Moderate sedation services provided by the                            same physician or other qualified health care                            professional performing a gastrointestinal                            endoscopic service that sedation supports,                            requiring the presence of an independent trained                            observer to assist in the monitoring of the                            patient's level of consciousness and physiological                            status; initial 15 minutes of intra-service time;                            patient age 61 years or older (additional time may                            be reported with [phone], as appropriate) Diagnosis Code(s):        --- Professional ---                           K22.8, Other specified diseases  of esophagus                           Z13.810, Encounter for screening for upper                            gastrointestinal disorder CPT copyright 2016 American Medical Association. All rights reserved. The codes documented in this report are preliminary and upon coder review may  be revised to meet current compliance requirements. Barney Drain, MD Barney Drain, MD 01/20/2016 9:36:04 PM This report has been signed electronically. Number of Addenda: 0

## 2016-01-20 NOTE — H&P (Signed)
Primary Care Physician:  Wende Neighbors, MD Primary Gastroenterologist:  [name]  Pre-Procedure History & Physical: HPI:  Jerry Macdonald is a 82 y.o. male here for [place].  Past Medical History  Diagnosis Date  . Sleep apnea   . Pyogenic granuloma     left cheeck  . Back pain     with radiculopathy  . Hyperlipidemia   . Seborrheic keratosis   . Obesity   . Malaise and fatigue   . Cataracts, bilateral   . Constipation   . Hypertrophy of prostate   . S/P endoscopy June 2011    Barrett's, mild gastritis and duodenitis  . S/P colonoscopy June 2011    rectal bleeding secondary to hemorrhoids  . S/P colonoscopy 2007    [facility], diverticulosis per Dr. Juel Burrow note  . Abnormal prostate biopsy     Past Surgical History  Procedure Laterality Date  . Hernia repair    . Varicose vein surgery    . Umbilical hernia repair    . Esophagogastroduodenoscopy  06/11/2011    Procedure: ESOPHAGOGASTRODUODENOSCOPY (EGD);  Surgeon: Dorothyann Peng, MD;  Location[facility];  Service: Endoscopy;  Laterality: N/A;  11:00  . Colonoscopy  JUN 2011    PAN-COLONIC DIVERTICULOSIS  . Colonoscopy  2007    [place] [facility]  . Esophagogastroduodenoscopy N/A 05/21/2014    Dr. Fields:Barrett's esophagus/ Medium sized hiatal hernia. path with indefinite dysplasia.   . Esophagogastroduodenoscopy N/A 12/20/2014    Dr. Clayburn Pert gastric polyp/large HH/6 cm segment of Barrett's esophagus, path with hyperplastic gastric polyp, no dysplasia. Surveillance April 2017 due     Prior to Admission medications   Medication Sig Start Date End Date Taking? Authorizing Provider  magnesium oxide (MAG-OX) 400 MG tablet Take 400 mg by mouth daily.   Yes Historical Provider, MD  Multiple Vitamins-Minerals (MULTIVITAMINS THER. W/MINERALS) TABS Take 1 tablet by mouth daily.     Yes Historical Provider, MD  omeprazole (PRILOSEC) 20 MG capsule TAKE 1 CAPSULE 30 MINUTES PRIOR TO MEALS TWICE A DAY  07/12/15  Yes Orvil Feil, NP  tamsulosin (FLOMAX) 0.4 MG CAPS capsule Take 0.4 mg by mouth daily.    Yes Historical Provider, MD    Allergies as of 01/06/2016 - Review Complete 01/06/2016  Allergen Reaction Noted  . Penicillins  07/07/2007    Family History  Problem Relation Age of Onset  . Colon polyps Neg Hx   . Colon cancer Neg Hx     Social History   Social History  . Marital Status: Divorced    Spouse Name: N/A  . Number of Children: N/A  . Years of Education: N/A   Occupational History  . Not on file.   Social History Main Topics  . Smoking status: Former Smoker -- 0.50 packs/day    Types: Cigarettes  . Smokeless tobacco: Not on file     Comment: quit in 1965  . Alcohol Use: No  . Drug Use: No  . Sexual Activity: Not on file   Other Topics Concern  . Not on file   Social History Narrative    Review of Systems: See HPI, otherwise negative ROS   Physical Exam: BP 123/74 mmHg  Pulse 96  Temp(Src) 97.6 F (36.4 C) (Oral)  Resp 16  Ht 5' 10.5" (1.791 m)  Wt 213 lb (96.616 kg)  BMI 30.12 kg/m2  SpO2 97% General:   Alert,  pleasant and cooperative in NAD Head:  Normocephalic and atraumatic.  Neck:  Supple; Lungs:  Clear throughout to auscultation.    Heart:  Regular rate and rhythm. Abdomen:  Soft, nontender and nondistended. Normal bowel sounds, without guarding, and without rebound.   Neurologic:  Alert and  oriented x4;  grossly normal neurologically.  Impression/Plan:    SCREENING FOR BARRETT'S ESOPHAGUS.  PLAN:  1.EGD TODAY

## 2016-01-24 ENCOUNTER — Encounter (HOSPITAL_COMMUNITY): Payer: Self-pay | Admitting: Gastroenterology

## 2016-02-01 ENCOUNTER — Telehealth: Payer: Self-pay | Admitting: Gastroenterology

## 2016-02-01 NOTE — Telephone Encounter (Signed)
Please call pt. The biopsies show Barrett's WITHOUT DYSPLASIA. HE SHOULD HAVE A REPEAT EGD AT [facility] WITHIN THE NEXT 2-3 [facility] AND/OR STAINS TO MAKE SURE NO ABNORMALITIES REMAIN. HIS EGD SHOWED IN SEP 2015-BARRETT'S WITH INDETERMINATE DYSPLASIA 34 CM FROM THE TEETH. OTHERWISE HE NEEDS A REPEAT EGD IN 3-5 YEARS.

## 2016-02-02 NOTE — Telephone Encounter (Signed)
LMOM to call.

## 2016-02-03 NOTE — Telephone Encounter (Signed)
Reminder in epic °

## 2016-02-06 ENCOUNTER — Other Ambulatory Visit: Payer: Self-pay

## 2016-02-06 DIAGNOSIS — K22719 Barrett's esophagus with dysplasia, unspecified: Secondary | ICD-10-CM

## 2016-02-06 NOTE — Telephone Encounter (Signed)
Clinicals faxed and referral made

## 2016-02-06 NOTE — Telephone Encounter (Signed)
PT called back and wanted me to speak to his special friend, Kathee Polite. I gave her the information and she said she will be the one to take him. Her phone number is [phone].

## 2016-02-06 NOTE — Telephone Encounter (Signed)
Pt came by the office and is aware of the results. It is OK to send him to [facility]. He would like his friend, Kathee Polite to be notified of date and time because she is the one that will take him.

## 2016-02-07 NOTE — Telephone Encounter (Signed)
Kathee Polite left a Vm that pt was disturbed about having to go to [facility] he has questions and was going to come by the office again and he wanted some answers. Said he hadn't even been given the results of his biopsies. I called and told Izora Gala that the results is what I went over with the pt and with her also when pt said I could talk with her. Then she remembered about the special imaging, and said she understands now. She will relay message to pt and if he is OK with that he will not come by the office.

## 2016-02-23 NOTE — Telephone Encounter (Signed)
Called patient TO DISCUSS CONCERNS. LVM-CALL [phone] TO DISCUSS.  

## 2016-02-24 DIAGNOSIS — C61 Malignant neoplasm of prostate: Secondary | ICD-10-CM | POA: Diagnosis not present

## 2016-03-02 ENCOUNTER — Other Ambulatory Visit (HOSPITAL_COMMUNITY)
Admission: RE | Admit: 2016-03-02 | Discharge: 2016-03-02 | Disposition: A | Discharge status: Home / Self Care | Payer: Medicare Other | Source: Other Acute Inpatient Hospital | Attending: Urology | Admitting: Urology

## 2016-03-02 ENCOUNTER — Ambulatory Visit (INDEPENDENT_AMBULATORY_CARE_PROVIDER_SITE_OTHER): Payer: Medicare Other | Admitting: Urology

## 2016-03-02 ENCOUNTER — Other Ambulatory Visit: Payer: Self-pay | Admitting: Urology

## 2016-03-02 DIAGNOSIS — N401 Enlarged prostate with lower urinary tract symptoms: Secondary | ICD-10-CM

## 2016-03-02 DIAGNOSIS — C61 Malignant neoplasm of prostate: Secondary | ICD-10-CM

## 2016-03-02 DIAGNOSIS — R350 Frequency of micturition: Secondary | ICD-10-CM | POA: Diagnosis not present

## 2016-03-02 DIAGNOSIS — R972 Elevated prostate specific antigen [PSA]: Secondary | ICD-10-CM | POA: Diagnosis not present

## 2016-03-02 LAB — URINALYSIS, ROUTINE W REFLEX MICROSCOPIC
Bilirubin Urine: NEGATIVE
Glucose, UA: NEGATIVE mg/dL
HGB URINE DIPSTICK: NEGATIVE
Ketones, ur: NEGATIVE mg/dL
Leukocytes, UA: NEGATIVE
Nitrite: NEGATIVE
Protein, ur: NEGATIVE mg/dL
SPECIFIC GRAVITY, URINE: 1.02 (ref 1.005–1.030)
pH: 6.5 (ref 5.0–8.0)

## 2016-03-05 DIAGNOSIS — K649 Unspecified hemorrhoids: Secondary | ICD-10-CM | POA: Diagnosis not present

## 2016-03-05 DIAGNOSIS — K209 Esophagitis, unspecified: Secondary | ICD-10-CM | POA: Diagnosis not present

## 2016-03-05 DIAGNOSIS — Z880 Allergy status to penicillin: Secondary | ICD-10-CM | POA: Diagnosis not present

## 2016-03-05 DIAGNOSIS — Z9989 Dependence on other enabling machines and devices: Secondary | ICD-10-CM | POA: Diagnosis not present

## 2016-03-05 DIAGNOSIS — K2270 Barrett's esophagus without dysplasia: Secondary | ICD-10-CM | POA: Diagnosis not present

## 2016-03-05 DIAGNOSIS — N400 Enlarged prostate without lower urinary tract symptoms: Secondary | ICD-10-CM | POA: Diagnosis not present

## 2016-03-05 DIAGNOSIS — G4733 Obstructive sleep apnea (adult) (pediatric): Secondary | ICD-10-CM | POA: Diagnosis not present

## 2016-03-05 DIAGNOSIS — R413 Other amnesia: Secondary | ICD-10-CM | POA: Diagnosis not present

## 2016-03-05 DIAGNOSIS — K449 Diaphragmatic hernia without obstruction or gangrene: Secondary | ICD-10-CM | POA: Diagnosis not present

## 2016-03-05 DIAGNOSIS — M1990 Unspecified osteoarthritis, unspecified site: Secondary | ICD-10-CM | POA: Diagnosis not present

## 2016-03-05 DIAGNOSIS — Z79899 Other long term (current) drug therapy: Secondary | ICD-10-CM | POA: Diagnosis not present

## 2016-03-05 DIAGNOSIS — K219 Gastro-esophageal reflux disease without esophagitis: Secondary | ICD-10-CM | POA: Diagnosis not present

## 2016-03-05 DIAGNOSIS — K22719 Barrett's esophagus with dysplasia, unspecified: Secondary | ICD-10-CM | POA: Diagnosis not present

## 2016-03-23 DIAGNOSIS — H25813 Combined forms of age-related cataract, bilateral: Secondary | ICD-10-CM | POA: Diagnosis not present

## 2016-03-23 DIAGNOSIS — H02834 Dermatochalasis of left upper eyelid: Secondary | ICD-10-CM | POA: Diagnosis not present

## 2016-03-23 DIAGNOSIS — H43812 Vitreous degeneration, left eye: Secondary | ICD-10-CM | POA: Diagnosis not present

## 2016-03-23 DIAGNOSIS — H02831 Dermatochalasis of right upper eyelid: Secondary | ICD-10-CM | POA: Diagnosis not present

## 2016-03-23 DIAGNOSIS — H353121 Nonexudative age-related macular degeneration, left eye, early dry stage: Secondary | ICD-10-CM | POA: Diagnosis not present

## 2016-04-13 ENCOUNTER — Encounter (HOSPITAL_COMMUNITY): Payer: Self-pay

## 2016-04-13 ENCOUNTER — Ambulatory Visit (HOSPITAL_COMMUNITY)
Admission: RE | Admit: 2016-04-13 | Discharge: 2016-04-13 | Disposition: A | Discharge status: Home / Self Care | Payer: Medicare Other | Source: Ambulatory Visit | Attending: Urology | Admitting: Urology

## 2016-04-13 DIAGNOSIS — R972 Elevated prostate specific antigen [PSA]: Secondary | ICD-10-CM | POA: Insufficient documentation

## 2016-04-13 DIAGNOSIS — C61 Malignant neoplasm of prostate: Secondary | ICD-10-CM

## 2016-04-13 MED ORDER — GENTAMICIN SULFATE 40 MG/ML IJ SOLN
INTRAMUSCULAR | Status: AC
  Administered 2016-04-13: 160 mg via INTRAMUSCULAR
  Filled 2016-04-13: qty 4

## 2016-04-13 MED ORDER — LIDOCAINE HCL (PF) 2 % IJ SOLN
10.0000 mL | Freq: Once | INTRAMUSCULAR | Status: AC
  Administered 2016-04-13: 10 mL

## 2016-04-13 MED ORDER — LIDOCAINE HCL (PF) 2 % IJ SOLN
INTRAMUSCULAR | Status: AC
Start: 2016-04-13 — End: 2016-04-13
  Administered 2016-04-13: 10 mL
  Filled 2016-04-13: qty 10

## 2016-04-13 MED ORDER — GENTAMICIN SULFATE 40 MG/ML IJ SOLN
160.0000 mg | Freq: Once | INTRAMUSCULAR | Status: AC
  Administered 2016-04-13: 160 mg via INTRAMUSCULAR

## 2016-04-13 NOTE — Discharge Instructions (Signed)
Transrectal Ultrasound-Guided Biopsy °A transrectal ultrasound-guided biopsy is a procedure to take samples of tissue from your prostate. Ultrasound images are used to guide the procedure. It is usually done to check the prostate gland for cancer. °BEFORE THE PROCEDURE °· Do not eat or drink after midnight on the night before your procedure. °· Take medicines as your doctor tells you. °· Your doctor may have you stop taking some medicines 5-7 days before the procedure. °· You will be given an enema before your procedure. During an enema, a liquid is put into your butt (rectum) to clear out waste. °· You may have lab tests the day of your procedure. °· Make plans to have someone drive you home. °PROCEDURE °· You will be given medicine to help you relax before the procedure. An IV tube will be put into one of your veins. It will be used to give fluids and medicine. °· You will be given medicine to reduce the risk of infection (antibiotic). °· You will be placed on your side. °· A probe with gel will be put in your butt. This is used to take pictures of your prostate and the area around it. °· A medicine to numb the area is put into your prostate. °· A biopsy needle is then inserted and guided to your prostate. °· Samples of prostate tissue are taken. The needle is removed. °· The samples are sent to a lab to be checked. Results are usually back in 2-3 days. °AFTER THE PROCEDURE °· You will be taken to a room where you will be watched until you are doing okay. °· You may have some pain in the area around your butt. You will be given medicines for this. °· You may be able to go home the same day. Sometimes, an overnight stay in the hospital is needed. °  °This information is not intended to replace advice given to you by your health care provider. Make sure you discuss any questions you have with your health care provider. °  °Document Released: 08/08/2009 Document Revised: 08/25/2013 Document Reviewed:  04/08/2013 °Elsevier Interactive Patient Education ©2016 [employer] ° °

## 2016-05-04 ENCOUNTER — Ambulatory Visit (INDEPENDENT_AMBULATORY_CARE_PROVIDER_SITE_OTHER): Payer: Medicare Other | Admitting: Urology

## 2016-05-04 DIAGNOSIS — N401 Enlarged prostate with lower urinary tract symptoms: Secondary | ICD-10-CM | POA: Diagnosis not present

## 2016-05-04 DIAGNOSIS — R350 Frequency of micturition: Secondary | ICD-10-CM | POA: Diagnosis not present

## 2016-05-04 DIAGNOSIS — C61 Malignant neoplasm of prostate: Secondary | ICD-10-CM

## 2016-05-11 DIAGNOSIS — E782 Mixed hyperlipidemia: Secondary | ICD-10-CM | POA: Diagnosis not present

## 2016-05-11 DIAGNOSIS — R7301 Impaired fasting glucose: Secondary | ICD-10-CM | POA: Diagnosis not present

## 2016-05-11 DIAGNOSIS — Z23 Encounter for immunization: Secondary | ICD-10-CM | POA: Diagnosis not present

## 2016-05-16 DIAGNOSIS — Z87891 Personal history of nicotine dependence: Secondary | ICD-10-CM | POA: Diagnosis not present

## 2016-05-16 DIAGNOSIS — C61 Malignant neoplasm of prostate: Secondary | ICD-10-CM | POA: Diagnosis not present

## 2016-05-16 DIAGNOSIS — Z8042 Family history of malignant neoplasm of prostate: Secondary | ICD-10-CM | POA: Diagnosis not present

## 2016-05-18 DIAGNOSIS — E782 Mixed hyperlipidemia: Secondary | ICD-10-CM | POA: Diagnosis not present

## 2016-05-18 DIAGNOSIS — K2270 Barrett's esophagus without dysplasia: Secondary | ICD-10-CM | POA: Diagnosis not present

## 2016-05-18 DIAGNOSIS — R7303 Prediabetes: Secondary | ICD-10-CM | POA: Diagnosis not present

## 2016-05-18 DIAGNOSIS — C61 Malignant neoplasm of prostate: Secondary | ICD-10-CM | POA: Diagnosis not present

## 2016-07-06 ENCOUNTER — Other Ambulatory Visit: Payer: Self-pay | Admitting: Gastroenterology

## 2016-08-03 DIAGNOSIS — C61 Malignant neoplasm of prostate: Secondary | ICD-10-CM | POA: Diagnosis not present

## 2016-08-10 ENCOUNTER — Ambulatory Visit (INDEPENDENT_AMBULATORY_CARE_PROVIDER_SITE_OTHER): Payer: Medicare Other | Admitting: Urology

## 2016-08-10 DIAGNOSIS — C61 Malignant neoplasm of prostate: Secondary | ICD-10-CM

## 2016-08-10 DIAGNOSIS — R3915 Urgency of urination: Secondary | ICD-10-CM | POA: Diagnosis not present

## 2016-08-10 DIAGNOSIS — R972 Elevated prostate specific antigen [PSA]: Secondary | ICD-10-CM | POA: Diagnosis not present

## 2016-08-10 DIAGNOSIS — N401 Enlarged prostate with lower urinary tract symptoms: Secondary | ICD-10-CM

## 2016-09-27 ENCOUNTER — Ambulatory Visit (INDEPENDENT_AMBULATORY_CARE_PROVIDER_SITE_OTHER): Payer: Medicare Other | Admitting: Otolaryngology

## 2016-11-12 DIAGNOSIS — D509 Iron deficiency anemia, unspecified: Secondary | ICD-10-CM | POA: Diagnosis not present

## 2016-11-12 DIAGNOSIS — E782 Mixed hyperlipidemia: Secondary | ICD-10-CM | POA: Diagnosis not present

## 2016-11-12 DIAGNOSIS — R7301 Impaired fasting glucose: Secondary | ICD-10-CM | POA: Diagnosis not present

## 2016-11-16 DIAGNOSIS — E6609 Other obesity due to excess calories: Secondary | ICD-10-CM | POA: Diagnosis not present

## 2016-11-16 DIAGNOSIS — Z6830 Body mass index (BMI) 30.0-30.9, adult: Secondary | ICD-10-CM | POA: Diagnosis not present

## 2016-11-16 DIAGNOSIS — C61 Malignant neoplasm of prostate: Secondary | ICD-10-CM | POA: Diagnosis not present

## 2016-11-16 DIAGNOSIS — E782 Mixed hyperlipidemia: Secondary | ICD-10-CM | POA: Diagnosis not present

## 2016-11-16 DIAGNOSIS — R7303 Prediabetes: Secondary | ICD-10-CM | POA: Diagnosis not present

## 2016-11-16 DIAGNOSIS — G4733 Obstructive sleep apnea (adult) (pediatric): Secondary | ICD-10-CM | POA: Diagnosis not present

## 2016-11-16 DIAGNOSIS — K2270 Barrett's esophagus without dysplasia: Secondary | ICD-10-CM | POA: Diagnosis not present

## 2016-12-14 DIAGNOSIS — C61 Malignant neoplasm of prostate: Secondary | ICD-10-CM | POA: Diagnosis not present

## 2016-12-21 ENCOUNTER — Ambulatory Visit (INDEPENDENT_AMBULATORY_CARE_PROVIDER_SITE_OTHER): Payer: Medicare Other | Admitting: Urology

## 2016-12-21 DIAGNOSIS — N401 Enlarged prostate with lower urinary tract symptoms: Secondary | ICD-10-CM | POA: Diagnosis not present

## 2016-12-21 DIAGNOSIS — C61 Malignant neoplasm of prostate: Secondary | ICD-10-CM

## 2016-12-21 DIAGNOSIS — R3915 Urgency of urination: Secondary | ICD-10-CM

## 2016-12-21 DIAGNOSIS — R972 Elevated prostate specific antigen [PSA]: Secondary | ICD-10-CM

## 2017-03-29 DIAGNOSIS — H43822 Vitreomacular adhesion, left eye: Secondary | ICD-10-CM | POA: Diagnosis not present

## 2017-03-29 DIAGNOSIS — H02831 Dermatochalasis of right upper eyelid: Secondary | ICD-10-CM | POA: Diagnosis not present

## 2017-03-29 DIAGNOSIS — H25813 Combined forms of age-related cataract, bilateral: Secondary | ICD-10-CM | POA: Diagnosis not present

## 2017-03-29 DIAGNOSIS — H02834 Dermatochalasis of left upper eyelid: Secondary | ICD-10-CM | POA: Diagnosis not present

## 2017-03-29 DIAGNOSIS — H353121 Nonexudative age-related macular degeneration, left eye, early dry stage: Secondary | ICD-10-CM | POA: Diagnosis not present

## 2017-04-19 DIAGNOSIS — C61 Malignant neoplasm of prostate: Secondary | ICD-10-CM | POA: Diagnosis not present

## 2017-04-26 ENCOUNTER — Ambulatory Visit (INDEPENDENT_AMBULATORY_CARE_PROVIDER_SITE_OTHER): Payer: Medicare Other | Admitting: Urology

## 2017-04-26 DIAGNOSIS — R972 Elevated prostate specific antigen [PSA]: Secondary | ICD-10-CM | POA: Diagnosis not present

## 2017-04-26 DIAGNOSIS — R3915 Urgency of urination: Secondary | ICD-10-CM

## 2017-04-26 DIAGNOSIS — C61 Malignant neoplasm of prostate: Secondary | ICD-10-CM

## 2017-04-26 DIAGNOSIS — N401 Enlarged prostate with lower urinary tract symptoms: Secondary | ICD-10-CM | POA: Diagnosis not present

## 2017-05-20 DIAGNOSIS — E782 Mixed hyperlipidemia: Secondary | ICD-10-CM | POA: Diagnosis not present

## 2017-05-20 DIAGNOSIS — R7301 Impaired fasting glucose: Secondary | ICD-10-CM | POA: Diagnosis not present

## 2017-05-20 DIAGNOSIS — D509 Iron deficiency anemia, unspecified: Secondary | ICD-10-CM | POA: Diagnosis not present

## 2017-05-24 DIAGNOSIS — Z6829 Body mass index (BMI) 29.0-29.9, adult: Secondary | ICD-10-CM | POA: Diagnosis not present

## 2017-05-24 DIAGNOSIS — E782 Mixed hyperlipidemia: Secondary | ICD-10-CM | POA: Diagnosis not present

## 2017-05-24 DIAGNOSIS — Z23 Encounter for immunization: Secondary | ICD-10-CM | POA: Diagnosis not present

## 2017-05-24 DIAGNOSIS — E6609 Other obesity due to excess calories: Secondary | ICD-10-CM | POA: Diagnosis not present

## 2017-05-24 DIAGNOSIS — C61 Malignant neoplasm of prostate: Secondary | ICD-10-CM | POA: Diagnosis not present

## 2017-05-24 DIAGNOSIS — K2270 Barrett's esophagus without dysplasia: Secondary | ICD-10-CM | POA: Diagnosis not present

## 2017-05-24 DIAGNOSIS — R7303 Prediabetes: Secondary | ICD-10-CM | POA: Diagnosis not present

## 2017-05-24 DIAGNOSIS — G4733 Obstructive sleep apnea (adult) (pediatric): Secondary | ICD-10-CM | POA: Diagnosis not present

## 2017-07-01 ENCOUNTER — Other Ambulatory Visit: Payer: Self-pay | Admitting: Nurse Practitioner

## 2017-10-11 DIAGNOSIS — C61 Malignant neoplasm of prostate: Secondary | ICD-10-CM | POA: Diagnosis not present

## 2017-10-16 NOTE — Progress Notes (Signed)
REVIEWED-NO ADDITIONAL RECOMMENDATIONS. 

## 2017-10-18 ENCOUNTER — Other Ambulatory Visit (HOSPITAL_COMMUNITY)
Admission: RE | Admit: 2017-10-18 | Discharge: 2017-10-18 | Disposition: A | Discharge status: Home / Self Care | Payer: Medicare Other | Source: Other Acute Inpatient Hospital | Attending: Urology | Admitting: Urology

## 2017-10-18 ENCOUNTER — Ambulatory Visit (INDEPENDENT_AMBULATORY_CARE_PROVIDER_SITE_OTHER): Payer: Medicare Other | Admitting: Urology

## 2017-10-18 DIAGNOSIS — R350 Frequency of micturition: Secondary | ICD-10-CM | POA: Insufficient documentation

## 2017-10-18 DIAGNOSIS — C61 Malignant neoplasm of prostate: Secondary | ICD-10-CM

## 2017-10-18 DIAGNOSIS — R972 Elevated prostate specific antigen [PSA]: Secondary | ICD-10-CM | POA: Diagnosis not present

## 2017-10-18 DIAGNOSIS — N401 Enlarged prostate with lower urinary tract symptoms: Secondary | ICD-10-CM | POA: Diagnosis not present

## 2017-10-18 LAB — URINALYSIS, COMPLETE (UACMP) WITH MICROSCOPIC
BACTERIA UA: NONE SEEN
BILIRUBIN URINE: NEGATIVE
Glucose, UA: NEGATIVE mg/dL
Hgb urine dipstick: NEGATIVE
KETONES UR: NEGATIVE mg/dL
Nitrite: NEGATIVE
PH: 6 (ref 5.0–8.0)
Protein, ur: NEGATIVE mg/dL
Specific Gravity, Urine: 1.015 (ref 1.005–1.030)

## 2017-10-20 LAB — URINE CULTURE: CULTURE: NO GROWTH

## 2017-11-21 DIAGNOSIS — R5383 Other fatigue: Secondary | ICD-10-CM | POA: Diagnosis not present

## 2017-11-21 DIAGNOSIS — G4730 Sleep apnea, unspecified: Secondary | ICD-10-CM | POA: Diagnosis not present

## 2017-11-21 DIAGNOSIS — R1010 Upper abdominal pain, unspecified: Secondary | ICD-10-CM | POA: Diagnosis not present

## 2017-11-21 DIAGNOSIS — Z6833 Body mass index (BMI) 33.0-33.9, adult: Secondary | ICD-10-CM | POA: Diagnosis not present

## 2017-11-22 DIAGNOSIS — D599 Acquired hemolytic anemia, unspecified: Secondary | ICD-10-CM | POA: Diagnosis not present

## 2017-11-22 DIAGNOSIS — N429 Disorder of prostate, unspecified: Secondary | ICD-10-CM | POA: Diagnosis not present

## 2017-11-22 DIAGNOSIS — E782 Mixed hyperlipidemia: Secondary | ICD-10-CM | POA: Diagnosis not present

## 2017-11-22 DIAGNOSIS — D509 Iron deficiency anemia, unspecified: Secondary | ICD-10-CM | POA: Diagnosis not present

## 2017-11-22 DIAGNOSIS — R7301 Impaired fasting glucose: Secondary | ICD-10-CM | POA: Diagnosis not present

## 2017-11-29 DIAGNOSIS — N400 Enlarged prostate without lower urinary tract symptoms: Secondary | ICD-10-CM | POA: Diagnosis not present

## 2017-11-29 DIAGNOSIS — G4733 Obstructive sleep apnea (adult) (pediatric): Secondary | ICD-10-CM | POA: Diagnosis not present

## 2017-11-29 DIAGNOSIS — K219 Gastro-esophageal reflux disease without esophagitis: Secondary | ICD-10-CM | POA: Diagnosis not present

## 2017-11-29 DIAGNOSIS — R7303 Prediabetes: Secondary | ICD-10-CM | POA: Diagnosis not present

## 2018-01-17 DIAGNOSIS — C61 Malignant neoplasm of prostate: Secondary | ICD-10-CM | POA: Diagnosis not present

## 2018-01-24 ENCOUNTER — Ambulatory Visit (INDEPENDENT_AMBULATORY_CARE_PROVIDER_SITE_OTHER): Payer: Medicare Other | Admitting: Urology

## 2018-01-24 DIAGNOSIS — R3915 Urgency of urination: Secondary | ICD-10-CM | POA: Diagnosis not present

## 2018-01-24 DIAGNOSIS — C61 Malignant neoplasm of prostate: Secondary | ICD-10-CM | POA: Diagnosis not present

## 2018-01-24 DIAGNOSIS — N401 Enlarged prostate with lower urinary tract symptoms: Secondary | ICD-10-CM | POA: Diagnosis not present

## 2018-01-24 DIAGNOSIS — R972 Elevated prostate specific antigen [PSA]: Secondary | ICD-10-CM

## 2018-03-27 DIAGNOSIS — H43823 Vitreomacular adhesion, bilateral: Secondary | ICD-10-CM | POA: Diagnosis not present

## 2018-03-27 DIAGNOSIS — H02831 Dermatochalasis of right upper eyelid: Secondary | ICD-10-CM | POA: Diagnosis not present

## 2018-03-27 DIAGNOSIS — H353121 Nonexudative age-related macular degeneration, left eye, early dry stage: Secondary | ICD-10-CM | POA: Diagnosis not present

## 2018-03-27 DIAGNOSIS — H02834 Dermatochalasis of left upper eyelid: Secondary | ICD-10-CM | POA: Diagnosis not present

## 2018-03-27 DIAGNOSIS — H25813 Combined forms of age-related cataract, bilateral: Secondary | ICD-10-CM | POA: Diagnosis not present

## 2018-03-27 DIAGNOSIS — H5703 Miosis: Secondary | ICD-10-CM | POA: Diagnosis not present

## 2018-03-27 DIAGNOSIS — H2181 Floppy iris syndrome: Secondary | ICD-10-CM | POA: Diagnosis not present

## 2018-05-29 DIAGNOSIS — N400 Enlarged prostate without lower urinary tract symptoms: Secondary | ICD-10-CM | POA: Diagnosis not present

## 2018-05-29 DIAGNOSIS — Z0000 Encounter for general adult medical examination without abnormal findings: Secondary | ICD-10-CM | POA: Diagnosis not present

## 2018-05-29 DIAGNOSIS — R7303 Prediabetes: Secondary | ICD-10-CM | POA: Diagnosis not present

## 2018-05-30 DIAGNOSIS — Z0000 Encounter for general adult medical examination without abnormal findings: Secondary | ICD-10-CM | POA: Diagnosis not present

## 2018-05-30 DIAGNOSIS — Z6832 Body mass index (BMI) 32.0-32.9, adult: Secondary | ICD-10-CM | POA: Diagnosis not present

## 2018-06-02 DIAGNOSIS — R7301 Impaired fasting glucose: Secondary | ICD-10-CM | POA: Diagnosis not present

## 2018-06-02 DIAGNOSIS — N400 Enlarged prostate without lower urinary tract symptoms: Secondary | ICD-10-CM | POA: Diagnosis not present

## 2018-06-02 DIAGNOSIS — M7551 Bursitis of right shoulder: Secondary | ICD-10-CM | POA: Diagnosis not present

## 2018-06-02 DIAGNOSIS — Z6833 Body mass index (BMI) 33.0-33.9, adult: Secondary | ICD-10-CM | POA: Diagnosis not present

## 2018-06-02 DIAGNOSIS — G4733 Obstructive sleep apnea (adult) (pediatric): Secondary | ICD-10-CM | POA: Diagnosis not present

## 2018-06-02 DIAGNOSIS — K219 Gastro-esophageal reflux disease without esophagitis: Secondary | ICD-10-CM | POA: Diagnosis not present

## 2018-06-12 DIAGNOSIS — Z23 Encounter for immunization: Secondary | ICD-10-CM | POA: Diagnosis not present

## 2018-06-27 ENCOUNTER — Other Ambulatory Visit: Payer: Self-pay | Admitting: Gastroenterology

## 2018-07-22 ENCOUNTER — Other Ambulatory Visit: Payer: Self-pay

## 2018-08-04 DIAGNOSIS — C61 Malignant neoplasm of prostate: Secondary | ICD-10-CM | POA: Diagnosis not present

## 2018-08-08 ENCOUNTER — Ambulatory Visit (INDEPENDENT_AMBULATORY_CARE_PROVIDER_SITE_OTHER): Payer: Medicare Other | Admitting: Urology

## 2018-08-08 DIAGNOSIS — R3915 Urgency of urination: Secondary | ICD-10-CM | POA: Diagnosis not present

## 2018-08-08 DIAGNOSIS — C61 Malignant neoplasm of prostate: Secondary | ICD-10-CM | POA: Diagnosis not present

## 2018-08-08 DIAGNOSIS — R972 Elevated prostate specific antigen [PSA]: Secondary | ICD-10-CM

## 2018-08-08 DIAGNOSIS — N401 Enlarged prostate with lower urinary tract symptoms: Secondary | ICD-10-CM | POA: Diagnosis not present

## 2018-12-08 DIAGNOSIS — K219 Gastro-esophageal reflux disease without esophagitis: Secondary | ICD-10-CM | POA: Diagnosis not present

## 2018-12-08 DIAGNOSIS — R7303 Prediabetes: Secondary | ICD-10-CM | POA: Diagnosis not present

## 2018-12-08 DIAGNOSIS — R7301 Impaired fasting glucose: Secondary | ICD-10-CM | POA: Diagnosis not present

## 2018-12-08 DIAGNOSIS — N400 Enlarged prostate without lower urinary tract symptoms: Secondary | ICD-10-CM | POA: Diagnosis not present

## 2018-12-08 DIAGNOSIS — R1010 Upper abdominal pain, unspecified: Secondary | ICD-10-CM | POA: Diagnosis not present

## 2018-12-08 DIAGNOSIS — G4733 Obstructive sleep apnea (adult) (pediatric): Secondary | ICD-10-CM | POA: Diagnosis not present

## 2018-12-08 DIAGNOSIS — R5383 Other fatigue: Secondary | ICD-10-CM | POA: Diagnosis not present

## 2018-12-08 DIAGNOSIS — G4730 Sleep apnea, unspecified: Secondary | ICD-10-CM | POA: Diagnosis not present

## 2018-12-15 DIAGNOSIS — K219 Gastro-esophageal reflux disease without esophagitis: Secondary | ICD-10-CM | POA: Diagnosis not present

## 2018-12-15 DIAGNOSIS — R7301 Impaired fasting glucose: Secondary | ICD-10-CM | POA: Diagnosis not present

## 2018-12-15 DIAGNOSIS — G4733 Obstructive sleep apnea (adult) (pediatric): Secondary | ICD-10-CM | POA: Diagnosis not present

## 2018-12-15 DIAGNOSIS — R5383 Other fatigue: Secondary | ICD-10-CM | POA: Diagnosis not present

## 2018-12-15 DIAGNOSIS — N400 Enlarged prostate without lower urinary tract symptoms: Secondary | ICD-10-CM | POA: Diagnosis not present

## 2018-12-15 DIAGNOSIS — C61 Malignant neoplasm of prostate: Secondary | ICD-10-CM | POA: Diagnosis not present

## 2019-02-06 DIAGNOSIS — C61 Malignant neoplasm of prostate: Secondary | ICD-10-CM | POA: Diagnosis not present

## 2019-02-09 ENCOUNTER — Encounter: Payer: Self-pay | Admitting: Gastroenterology

## 2019-02-13 ENCOUNTER — Ambulatory Visit (INDEPENDENT_AMBULATORY_CARE_PROVIDER_SITE_OTHER): Payer: Medicare Other | Admitting: Urology

## 2019-02-13 DIAGNOSIS — C61 Malignant neoplasm of prostate: Secondary | ICD-10-CM | POA: Diagnosis not present

## 2019-02-13 DIAGNOSIS — N401 Enlarged prostate with lower urinary tract symptoms: Secondary | ICD-10-CM

## 2019-02-13 DIAGNOSIS — R972 Elevated prostate specific antigen [PSA]: Secondary | ICD-10-CM

## 2019-02-13 DIAGNOSIS — R350 Frequency of micturition: Secondary | ICD-10-CM | POA: Diagnosis not present

## 2019-02-23 ENCOUNTER — Other Ambulatory Visit: Payer: Self-pay

## 2019-02-23 ENCOUNTER — Ambulatory Visit (INDEPENDENT_AMBULATORY_CARE_PROVIDER_SITE_OTHER): Payer: Medicare Other | Admitting: Nurse Practitioner

## 2019-02-23 ENCOUNTER — Telehealth: Payer: Self-pay

## 2019-02-23 ENCOUNTER — Encounter: Payer: Self-pay | Admitting: Nurse Practitioner

## 2019-02-23 VITALS — BP 137/81 | HR 65 | Temp 97.0°F | Ht 71.0 in | Wt 215.4 lb

## 2019-02-23 DIAGNOSIS — Z0000 Encounter for general adult medical examination without abnormal findings: Secondary | ICD-10-CM | POA: Diagnosis not present

## 2019-02-23 DIAGNOSIS — K2270 Barrett's esophagus without dysplasia: Secondary | ICD-10-CM

## 2019-02-23 DIAGNOSIS — Z1211 Encounter for screening for malignant neoplasm of colon: Secondary | ICD-10-CM

## 2019-02-23 MED ORDER — PANTOPRAZOLE SODIUM 40 MG PO TBEC: 40.0000 mg | DELAYED_RELEASE_TABLET | Freq: Two times a day (BID) | ORAL | 3 refills | Status: DC

## 2019-02-23 MED ORDER — PEG 3350-KCL-NA BICARB-NACL 420 G PO SOLR: 4000.0000 mL | ORAL | 0 refills | Status: DC

## 2019-02-23 NOTE — Progress Notes (Addendum)
REVIEWED. Called patient TO DISCUSS ENDOSCOPY. EXPLAINED PT DOESN'T NEED A ROUTINE COLONOSCOPY. CANCEL TCS. CONSIDER AT AGE 85. WILL PERFORM LAST SCREENING EGD AT AGE 28.  Primary Care Physician:  [name], MD Primary Gastroenterologist:  [name]  Chief Complaint  Patient presents with  . barretts    c/o with a Jerry of gas after taking omperazole   . Consult    TCS/EGD    HPI:   Jerry Macdonald is a 85 y.o. male who presents on follow-up to schedule colonoscopy and upper endoscopy.  The patient was last seen in our office 01/06/2016 for Barrett's esophagus.  History of Barrett's esophagus with EGD in 2015 with indefinite dysplasia in 2016 with no dysplasia is and at the time of his last visit was due for 1 year surveillance.  GERD well controlled, no overt symptoms.  Remains on PPI twice daily at that time.  Recommended upper endoscopy.  EGD was completed 01/20/2016 which found likely long segment Barrett's esophagus.  Surgical pathology found the random biopsies to be Barrett's without dysplasia or malignancy.  Recommended repeat EGD at [facility] within the next 2 to 3 months for special imaging to make sure no further abnormalities.  Also recommended repeat EGD here in 3 years.  The patient did follow-up with [facility] on 03/05/2016 at the GI clinic for Barrett's.  EGD was completed and all biopsies were negative for dysplasia.  Most recent colonoscopy completed 02/22/2010 which found pancolonic diverticulosis, large internal hemorrhoids, otherwise no abnormalities.  Recommended repeat exam in 10 years (2021).   The patient is on the Recall list for 02/2019.  Today he states he's doing well overall. Has been having significant gas on Omeprazole and is wanting to try a different PPI. Denies abdominal pain, N/V, hematochezia, melena, fever, chills, unintentional weight loss, dysphagia. He is very active and walks 2 miles 5 days a week. Denies URI or  flu-like symptoms. Denies loss of sense of taste or smell. Denies chest pain, dyspnea, dizziness, lightheadedness, syncope, near syncope. Denies any other upper or lower GI symptoms.  Past Medical History:  Diagnosis Date  . Abnormal prostate biopsy   . Back pain    with radiculopathy  . Cataracts, bilateral   . Constipation   . Hyperlipidemia   . Hypertrophy of prostate   . Malaise and fatigue   . Obesity   . Pyogenic granuloma    left cheeck  . S/P colonoscopy June 2011   rectal bleeding secondary to hemorrhoids  . S/P colonoscopy 2007   [facility], diverticulosis per Dr. Juel Burrow note  . S/P endoscopy June 2011   Barrett's, mild gastritis and duodenitis  . Seborrheic keratosis   . Sleep apnea     Past Surgical History:  Procedure Laterality Date  . COLONOSCOPY  JUN 2011   PAN-COLONIC DIVERTICULOSIS  . COLONOSCOPY  2007   [facility]  . ESOPHAGOGASTRODUODENOSCOPY  06/11/2011   Procedure: ESOPHAGOGASTRODUODENOSCOPY (EGD);  Surgeon: Dorothyann Peng, MD;  [facility];  Service: Endoscopy;  Laterality: N/A;  11:00  . ESOPHAGOGASTRODUODENOSCOPY N/A 05/21/2014   Dr. Fields:Barrett's esophagus/ Medium sized hiatal hernia. path with indefinite dysplasia.   Marland Kitchen ESOPHAGOGASTRODUODENOSCOPY N/A 12/20/2014   Dr. Clayburn Pert gastric polyp/large HH/6 cm segment of Barrett's esophagus, path with hyperplastic gastric polyp, no dysplasia. Surveillance April 2017 due   . ESOPHAGOGASTRODUODENOSCOPY N/A 01/20/2016   Procedure: ESOPHAGOGASTRODUODENOSCOPY (EGD);  Surgeon: Danie Binder, MD;  Location: [facility];  Service:  Endoscopy;  Laterality: N/A;  1230pm  . HERNIA REPAIR    . UMBILICAL HERNIA REPAIR    . VARICOSE VEIN SURGERY      Current Outpatient Medications  Medication Sig Dispense Refill  . finasteride (PROSCAR) 5 MG tablet daily.    . magnesium oxide (MAG-OX) 400 MG tablet Take 400 mg by mouth daily.    . Multiple Vitamins-Minerals (MULTIVITAMINS THER. W/MINERALS) TABS Take  1 tablet by mouth daily.      Marland Kitchen omeprazole (PRILOSEC) 20 MG capsule TAKE 1 CAPSULE TWICE A DAY 30 MINUTES PRIOR TO MEALS 180 capsule 4  . tamsulosin (FLOMAX) 0.4 MG CAPS capsule Take 0.4 mg by mouth daily.      No current facility-administered medications for this visit.     Allergies as of 02/23/2019 - Review Complete 02/23/2019  Allergen Reaction Noted  . Penicillins  07/07/2007    Family History  Problem Relation Age of Onset  . Colon polyps Neg Hx   . Colon cancer Neg Hx     Social History   Socioeconomic History  . Marital status: Divorced    Spouse name: Not on file  . Number of children: Not on file  . Years of education: Not on file  . Highest education level: Not on file  Occupational History  . Not on file  Social Needs  . Financial resource strain: Not on file  . Food insecurity    Worry: Not on file    Inability: Not on file  . Transportation needs    Medical: Not on file    Non-medical: Not on file  Tobacco Use  . Smoking status: Former Smoker    Packs/day: 0.50    Types: Cigarettes  . Smokeless tobacco: Never Used  . Tobacco comment: quit in 1965  Substance and Sexual Activity  . Alcohol use: Yes    Comment: 2-3 oz red wine a day  . Drug use: No  . Sexual activity: Not on file  Lifestyle  . Physical activity    Days per week: Not on file    Minutes per session: Not on file  . Stress: Not on file  Relationships  . Social Herbalist on phone: Not on file    Gets together: Not on file    Attends religious service: Not on file    Active member of club or organization: Not on file    Attends meetings of clubs or organizations: Not on file    Relationship status: Not on file  . Intimate partner violence    Fear of current or ex partner: Not on file    Emotionally abused: Not on file    Physically abused: Not on file    Forced sexual activity: Not on file  Other Topics Concern  . Not on file  Social History Narrative  . Not on file     Review of Systems: Complete ROS negative except as per HPI.    Physical Exam: BP 137/81   Pulse 65   Temp (!) 97 F (36.1 C) (Oral)   Ht 5\' 11"  (1.803 m)   Wt 215 lb 6.4 oz (97.7 kg)   BMI 30.04 kg/m  General:   Alert and oriented. Pleasant and cooperative. Well-nourished and well-developed.  Eyes:  Without icterus, sclera clear and conjunctiva pink.  Ears:  Normal auditory acuity. Cardiovascular:  S1, S2 present without murmurs appreciated. Extremities without clubbing or edema. Respiratory:  Clear to auscultation bilaterally. No wheezes, rales,  or rhonchi. No distress.  Gastrointestinal:  +BS, soft, non-tender and non-distended. No HSM noted. No guarding or rebound. No masses appreciated.  Rectal:  Deferred  Musculoskalatal:  Symmetrical without gross deformities. Skin:  Intact without significant lesions or rashes. Neurologic:  Alert and oriented x4;  grossly normal neurologically. Psych:  Alert and cooperative. Normal mood and affect. Heme/Lymph/Immune: No excessive bruising noted.    02/23/2019 9:23 AM   Disclaimer: This note was dictated with voice recognition software. Similar sounding words can inadvertently be transcribed and may not be corrected upon review.

## 2019-02-23 NOTE — Telephone Encounter (Signed)
Pt scheduled for TCS/EGD 03/27/19 at 12:15pm during OV this am. Orders entered. Rx for prep sent to pharmacy. Instructions mailed to pt.

## 2019-02-23 NOTE — Assessment & Plan Note (Signed)
He will be due for repeat colonoscopy within the next year.  At this point, given his need for EGD, we can schedule him for his colonoscopy updated at the same time.  Denies any overt GI symptoms.  No red flag or warning signs or symptoms.  Proceed with colonoscopy at this time.  Proceed with colonoscopy with [name] in the near future. The risks, benefits, and alternatives have been discussed in detail with the patient. They state understanding and desire to proceed.   The patient is not on any anticoagulants, anxiolytics, chronic pain medications, or antidepressants.  Conscious sedation should be adequate for his procedure as it was for his last.

## 2019-02-23 NOTE — Progress Notes (Signed)
cc'ed to pcp °

## 2019-02-23 NOTE — Patient Instructions (Signed)
Your health issues we discussed today were:   Barrett's esophagus: 1. You are currently due for upper endoscopy to further evaluate the lining of your esophagus. 2. As we discussed, you will need to be on an acid blocker for the rest of your life 3. Because Prilosec (omeprazole) is causing excessive gas I will switch you to Protonix (pantoprazole) to see if this does better 4. Call us if you have any worsening or severe symptoms 5. Further recommendations will follow your upper endoscopy  Need for colonoscopy: 1. You are due for a colonoscopy within the next year. 2. We can complete your colonoscopy at the same time as your upper endoscopy 3. Further recommendations will follow your colonoscopy  Overall I recommend:  1. Continue your other medications, other than the Prilosec (omeprazole) I am changing to Protonix (pantoprazole) 2. Return for follow-up based on recommendations made after your procedures 3. Call us if you have any questions or concerns   Because of recent events of COVID-19 ("Coronavirus"), follow CDC recommendations:  1. Wash your hand frequently 2. Avoid touching your face 3. Stay away from people who are sick 4. If you have symptoms such as fever, cough, shortness of breath then call your healthcare provider for further guidance 5. If you are sick, STAY AT HOME unless otherwise directed by your healthcare provider. 6. Follow directions from state and national officials regarding staying safe   At [facility] we value your feedback. You may receive a survey about your visit today. Please share your experience as we strive to create trusting relationships with our patients to provide genuine, compassionate, quality care.  We appreciate your understanding and patience as we review any laboratory studies, imaging, and other diagnostic tests that are ordered as we care for you. Our office policy is 5 business days for review of these results, and any  emergent or urgent results are addressed in a timely manner for your best interest. If you do not hear from our office in 1 week, please contact us.   We also encourage the use of MyChart, which contains your medical information for your review as well. If you are not enrolled in this feature, an access code is on this after visit summary for your convenience. Thank you for allowing [place] to be involved in your care.  It was great to see you today!  I hope you have a great summer!!

## 2019-02-23 NOTE — Assessment & Plan Note (Signed)
Known history of Barrett's esophagus.  Previous EGD found indefinite dysplasia.  EGD since then including 1 at [facility] was negative for dysplasia.  Remains on PPI twice daily.  He is having some increasing gas on Prilosec and requests a different option.  I have switched him to Protonix to see if this works any better.  Discussed the need for indefinite PPI and he verbalized understanding and plans to comply with this.  At this point he is currently due for a repeat EGD.  We will proceed at this time for Barrett's surveillance.  Proceed with EGD with [name] in near future: the risks, benefits, and alternatives have been discussed with the patient in detail. The patient states understanding and desires to proceed.  The patient is not on any anticoagulants, anxiolytics, chronic pain medications, or antidepressants.  Conscious sedation should be adequate for his procedure as it was for his last.

## 2019-02-24 ENCOUNTER — Other Ambulatory Visit: Payer: Self-pay

## 2019-02-24 ENCOUNTER — Telehealth: Payer: Self-pay

## 2019-02-24 DIAGNOSIS — K2270 Barrett's esophagus without dysplasia: Secondary | ICD-10-CM

## 2019-02-24 NOTE — Telephone Encounter (Signed)
Per [facility]: Called pt to discuss endoscopy. Explained pt doesn't need a routine colonoscopy. Cancel TCS. Consider at age 85. Will perform last screening EGD at age 89.  Endo scheduler cancelled previous orders. New orders for EGD only entered. New procedure instructions mailed. Called and informed pt.

## 2019-03-04 ENCOUNTER — Telehealth: Payer: Self-pay

## 2019-03-04 NOTE — Telephone Encounter (Signed)
Called pt, COVID test scheduled for 03/24/19 at 3:05pm prior to procedure 03/27/19. Pt is aware to quarantine after test until procedure.

## 2019-03-24 ENCOUNTER — Other Ambulatory Visit (HOSPITAL_COMMUNITY)
Admission: RE | Admit: 2019-03-24 | Discharge: 2019-03-24 | Disposition: A | Discharge status: Home / Self Care | Payer: Medicare Other | Source: Ambulatory Visit | Attending: Gastroenterology | Admitting: Gastroenterology

## 2019-03-24 ENCOUNTER — Other Ambulatory Visit: Payer: Self-pay

## 2019-03-24 DIAGNOSIS — Z1159 Encounter for screening for other viral diseases: Secondary | ICD-10-CM | POA: Diagnosis not present

## 2019-03-24 LAB — SARS CORONAVIRUS 2 (TAT 6-24 HRS): SARS Coronavirus 2: NEGATIVE

## 2019-03-27 ENCOUNTER — Ambulatory Visit (HOSPITAL_COMMUNITY): Admit: 2019-03-27 | Payer: Medicare Other | Admitting: Gastroenterology

## 2019-03-27 ENCOUNTER — Encounter (HOSPITAL_COMMUNITY)
Admission: RE | Disposition: A | Discharge status: Home / Self Care | Payer: Self-pay | Source: Home / Self Care | Attending: Gastroenterology

## 2019-03-27 ENCOUNTER — Ambulatory Visit (HOSPITAL_COMMUNITY)
Admission: RE | Admit: 2019-03-27 | Discharge: 2019-03-27 | Disposition: A | Discharge status: Home / Self Care | Payer: Medicare Other | Attending: Gastroenterology | Admitting: Gastroenterology

## 2019-03-27 ENCOUNTER — Encounter (HOSPITAL_COMMUNITY): Payer: Self-pay

## 2019-03-27 ENCOUNTER — Other Ambulatory Visit: Payer: Self-pay

## 2019-03-27 ENCOUNTER — Encounter (HOSPITAL_COMMUNITY): Payer: Self-pay | Admitting: Gastroenterology

## 2019-03-27 DIAGNOSIS — G4730 Sleep apnea, unspecified: Secondary | ICD-10-CM | POA: Diagnosis not present

## 2019-03-27 DIAGNOSIS — K317 Polyp of stomach and duodenum: Secondary | ICD-10-CM | POA: Insufficient documentation

## 2019-03-27 DIAGNOSIS — Z6827 Body mass index (BMI) 27.0-27.9, adult: Secondary | ICD-10-CM | POA: Diagnosis not present

## 2019-03-27 DIAGNOSIS — E669 Obesity, unspecified: Secondary | ICD-10-CM | POA: Diagnosis not present

## 2019-03-27 DIAGNOSIS — K2270 Barrett's esophagus without dysplasia: Secondary | ICD-10-CM | POA: Diagnosis not present

## 2019-03-27 DIAGNOSIS — Z79899 Other long term (current) drug therapy: Secondary | ICD-10-CM | POA: Diagnosis not present

## 2019-03-27 DIAGNOSIS — Z09 Encounter for follow-up examination after completed treatment for conditions other than malignant neoplasm: Secondary | ICD-10-CM | POA: Diagnosis present

## 2019-03-27 DIAGNOSIS — Z87891 Personal history of nicotine dependence: Secondary | ICD-10-CM | POA: Diagnosis not present

## 2019-03-27 DIAGNOSIS — K449 Diaphragmatic hernia without obstruction or gangrene: Secondary | ICD-10-CM | POA: Insufficient documentation

## 2019-03-27 DIAGNOSIS — K2970 Gastritis, unspecified, without bleeding: Secondary | ICD-10-CM | POA: Diagnosis not present

## 2019-03-27 HISTORY — PX: ESOPHAGOGASTRODUODENOSCOPY: SHX5428

## 2019-03-27 HISTORY — PX: BIOPSY: SHX5522

## 2019-03-27 SURGERY — COLONOSCOPY
Anesthesia: Moderate Sedation

## 2019-03-27 SURGERY — EGD (ESOPHAGOGASTRODUODENOSCOPY)
Anesthesia: Moderate Sedation

## 2019-03-27 MED ORDER — LIDOCAINE VISCOUS HCL 2 % MT SOLN
OROMUCOSAL | Status: DC | PRN
  Administered 2019-03-27: 1 via OROMUCOSAL

## 2019-03-27 MED ORDER — MEPERIDINE HCL 100 MG/ML IJ SOLN
INTRAMUSCULAR | Status: AC
  Filled 2019-03-27: qty 2

## 2019-03-27 MED ORDER — MIDAZOLAM HCL 5 MG/5ML IJ SOLN
INTRAMUSCULAR | Status: DC | PRN
  Administered 2019-03-27 (×2): 2 mg via INTRAVENOUS

## 2019-03-27 MED ORDER — SODIUM CHLORIDE 0.9 % IV SOLN
INTRAVENOUS | Status: DC
  Administered 2019-03-27: 12:00:00 via INTRAVENOUS

## 2019-03-27 MED ORDER — MEPERIDINE HCL 100 MG/ML IJ SOLN
INTRAMUSCULAR | Status: DC | PRN
  Administered 2019-03-27: 25 mg via INTRAVENOUS
  Administered 2019-03-27: 50 mg via INTRAVENOUS

## 2019-03-27 MED ORDER — MIDAZOLAM HCL 5 MG/5ML IJ SOLN
INTRAMUSCULAR | Status: AC
  Filled 2019-03-27: qty 10

## 2019-03-27 MED ORDER — LIDOCAINE VISCOUS HCL 2 % MT SOLN
OROMUCOSAL | Status: AC
  Filled 2019-03-27: qty 15

## 2019-03-27 NOTE — H&P (Signed)
Primary Care Physician:  [name], MD Primary Gastroenterologist:  [name]  Pre-Procedure History & Physical: HPI:  Jerry Macdonald is a 85 y.o. male here for SCREENING FOR BARRETT'S ESOPHAGUS-indeterminate dysplasia in SEP 2015. LAST EGD AUG 2017.  Past Medical History:  Diagnosis Date  . Abnormal prostate biopsy   . Back pain    with radiculopathy  . Cataracts, bilateral   . Constipation   . Hyperlipidemia   . Hypertrophy of prostate   . Malaise and fatigue   . Obesity   . Pyogenic granuloma    left cheeck  . S/P colonoscopy June 2011   rectal bleeding secondary to hemorrhoids  . S/P colonoscopy 2007   [facility], diverticulosis per Dr. Juel Burrow note  . S/P endoscopy June 2011   Barrett's, mild gastritis and duodenitis  . Seborrheic keratosis   . Sleep apnea     Past Surgical History:  Procedure Laterality Date  . COLONOSCOPY  JUN 2011   PAN-COLONIC DIVERTICULOSIS  . COLONOSCOPY  2007   [facility]  . ESOPHAGOGASTRODUODENOSCOPY  06/11/2011   Procedure: ESOPHAGOGASTRODUODENOSCOPY (EGD);  Surgeon: Dorothyann Peng, MD;  [facility];  Service: Endoscopy;  Laterality: N/A;  11:00  . ESOPHAGOGASTRODUODENOSCOPY N/A 05/21/2014   Dr. Fields:Barrett's esophagus/ Medium sized hiatal hernia. path with indefinite dysplasia.   Marland Kitchen ESOPHAGOGASTRODUODENOSCOPY N/A 12/20/2014   Dr. Clayburn Pert gastric polyp/large HH/6 cm segment of Barrett's esophagus, path with hyperplastic gastric polyp, no dysplasia. Surveillance April 2017 due   . ESOPHAGOGASTRODUODENOSCOPY N/A 01/20/2016   Procedure: ESOPHAGOGASTRODUODENOSCOPY (EGD);  Surgeon: Danie Binder, MD;  Location[facility];  Service: Endoscopy;  Laterality: N/A;  1230pm  . HERNIA REPAIR    . UMBILICAL HERNIA REPAIR    . VARICOSE VEIN SURGERY      Prior to Admission medications   Medication Sig Start Date End Date Taking? Authorizing Provider  finasteride (PROSCAR) 5 MG tablet Take 5 mg by mouth daily.  12/31/18  Yes  [provider]  magnesium oxide (MAG-OX) 400 MG tablet Take 800 mg by mouth daily.    Yes [provider]  Multiple Vitamins-Minerals (MULTIVITAMINS THER. W/MINERALS) TABS Take 1 tablet by mouth daily.     Yes [provider]  pantoprazole (PROTONIX) 40 MG tablet Take 1 tablet (40 mg total) by mouth 2 (two) times daily before a meal. 02/23/19  Yes Carlis Stable, NP  tamsulosin (FLOMAX) 0.4 MG CAPS capsule Take 0.4 mg by mouth daily.    Yes [provider]  polyethylene glycol-electrolytes (TRILYTE) 420 g solution Take 4,000 mLs by mouth as directed. 02/23/19   Danie Binder, MD    Allergies as of 02/24/2019 - Review Complete 02/23/2019  Allergen Reaction Noted  . Penicillins  07/07/2007    Family History  Problem Relation Age of Onset  . Colon polyps Neg Hx   . Colon cancer Neg Hx     Social History   Socioeconomic History  . Marital status: Divorced    Spouse name: Not on file  . Number of children: Not on file  . Years of education: Not on file  . Highest education level: Not on file  Occupational History  . Not on file  Social Needs  . Financial resource strain: Not on file  . Food insecurity    Worry: Not on file    Inability: Not on file  . Transportation needs    Medical: Not on file    Non-medical: Not on  file  Tobacco Use  . Smoking status: Former Smoker    Packs/day: 0.50    Types: Cigarettes  . Smokeless tobacco: Never Used  . Tobacco comment: quit in 1965  Substance and Sexual Activity  . Alcohol use: Yes    Comment: 2-3 oz red wine a day  . Drug use: No  . Sexual activity: Not on file  Lifestyle  . Physical activity    Days per week: Not on file    Minutes per session: Not on file  . Stress: Not on file  Relationships  . Social Herbalist on phone: Not on file    Gets together: Not on file    Attends religious service: Not on file    Active member of club or organization: Not on file    Attends  meetings of clubs or organizations: Not on file    Relationship status: Not on file  . Intimate partner violence    Fear of current or ex partner: Not on file    Emotionally abused: Not on file    Physically abused: Not on file    Forced sexual activity: Not on file  Other Topics Concern  . Not on file  Social History Narrative  . Not on file    Review of Systems: See HPI, otherwise negative ROS   Physical Exam: BP 122/73   Pulse 65   Temp 98.2 F (36.8 C) (Oral)   Resp 19   Ht 5\' 11"  (1.803 m)   Wt 90.7 kg   SpO2 97%   BMI 27.89 kg/m  General:   Alert,  pleasant and cooperative in NAD Head:  Normocephalic and atraumatic. Neck:  Supple; Lungs:  Clear throughout to auscultation.    Heart:  Regular rate and rhythm. Abdomen:  Soft, nontender and nondistended. Normal bowel sounds, without guarding, and without rebound.   Neurologic:  Alert and  oriented x4;  grossly normal neurologically.  Impression/Plan:    SCREENING FOR BARRETT'S ESOPHAGUS.  PLAN:  1.EGD TODAY.  DISCUSSED PROCEDURE, BENEFITS, & RISKS: < 1% chance of medication reaction, bleeding, perforation, or ASPIRATION.

## 2019-03-27 NOTE — Discharge Instructions (Signed)
You have BARRETT'S ESOPHAGUS. IT LOOKS THE SAME AS IT DID IN 2015. YOU HAVE A HIATAL HERNIA & gastritis. I biopsied your ESOPHAGUS.   AVOID TRIGGERS FOR REFLUX. SEE INFO BELOW.  FOLLOW A LOW FAT DIET. MEATS SHOULD BE BAKED, BROILED, OR BOILED. AVOID FRIED FOODS.  YOUR BIOPSY RESULTS WILL BE BACK IN 5 BUSINESS DAYS.        UPPER ENDOSCOPY AFTER CARE Read the instructions outlined below and refer to this sheet in the next week. These discharge instructions provide you with general information on caring for yourself after you leave the hospital. While your treatment has been planned according to the most current medical practices available, unavoidable complications occasionally occur. If you have any problems or questions after discharge, call DR. Tyris Eliot, [phone].  ACTIVITY  You may resume your regular activity, but move at a slower pace for the next 24 hours.   Take frequent rest periods for the next 24 hours.   Walking will help get rid of the air and reduce the bloated feeling in your belly (abdomen).   No driving for 24 hours (because of the medicine (anesthesia) used during the test).   You may shower.   Do not sign any important legal documents or operate any machinery for 24 hours (because of the anesthesia used during the test).    NUTRITION  Drink plenty of fluids.   You may resume your normal diet as instructed by your doctor.   Begin with a light meal and progress to your normal diet. Heavy or fried foods are harder to digest and may make you feel sick to your stomach (nauseated).   Avoid alcoholic beverages for 24 hours or as instructed.    MEDICATIONS  You may resume your normal medications.   WHAT YOU CAN EXPECT TODAY  Some feelings of bloating in the abdomen.   Passage of more gas than usual.    IF YOU HAD A BIOPSY TAKEN DURING THE UPPER ENDOSCOPY:  Eat a soft diet IF YOU HAVE NAUSEA, BLOATING, ABDOMINAL PAIN, OR VOMITING.    FINDING OUT  THE RESULTS OF YOUR TEST Not all test results are available during your visit. [name] WILL CALL YOU WITHIN 7 DAYS OF YOUR PROCEDUE WITH YOUR RESULTS. Do not assume everything is normal if you have not heard from DR. Ramez Arrona IN ONE WEEK, CALL HER OFFICE AT [phone].  SEEK IMMEDIATE MEDICAL ATTENTION AND CALL THE OFFICE: [phone] IF:  You have more than a spotting of blood in your stool.   Your belly is swollen (abdominal distention).   You are nauseated or vomiting.   You have a temperature over 101F.   You have abdominal pain or discomfort that is severe or gets worse throughout the day.   Hiatal Hernia A hiatal hernia occurs when a part of the stomach slides above the diaphragm. The diaphragm is the thin muscle separating the belly (abdomen) from the chest. A hiatal hernia can be something you are born with or develop over time. Hiatal hernias may allow stomach acid to flow back into your esophagus, the tube which carries food from your mouth to your stomach. If this acid causes problems it is called GERD (gastro-esophageal reflux disease).     Lifestyle and home remedies TO CONTROL HEARTBURN/REFLUX You may eliminate or reduce the frequency of heartburn by making the following lifestyle changes:   Control your weight. Being overweight is a major risk factor for heartburn and GERD. Excess pounds put pressure on your  abdomen, pushing up your stomach and causing acid to back up into your esophagus.    Eat smaller meals. 4 TO 6 MEALS A DAY. This reduces pressure on the lower esophageal sphincter, helping to prevent the valve from opening and acid from washing back into your esophagus.    Loosen your belt. Clothes that fit tightly around your waist put pressure on your abdomen and the lower esophageal sphincter.    Eliminate heartburn triggers. Everyone has specific triggers. Common triggers such as fatty or fried foods, spicy food, tomato sauce, carbonated beverages,  alcohol, chocolate, mint, garlic, onion, caffeine and nicotine may make heartburn worse.    Avoid stooping or bending. Tying your shoes is OK. Bending over for longer periods to weed your garden isn't, especially soon after eating.    Don't lie down after a meal. Wait at least three to four hours after eating before going to bed, and don't lie down right after eating.   Alternative medicine  Several home remedies exist for treating GERD, but they provide only temporary relief. They include drinking baking soda (sodium bicarbonate) added to water or drinking other fluids such as baking soda mixed with cream of tartar and water.  Although these liquids create temporary relief by neutralizing, washing away or buffering acids, eventually they aggravate the situation by adding gas and fluid to your stomach, increasing pressure and causing more acid reflux. Further, adding more sodium to your diet may increase your blood pressure and add stress to your heart, and excessive bicarbonate ingestion can alter the acid-base balance in your body.

## 2019-03-27 NOTE — Op Note (Signed)
[facility] Patient Name: Jerry Macdonald Procedure Date: 03/27/2019 11:35 AM MRN: [id number] Date of Birth: August 21, 1931 Attending MD: Barney Drain MD, MD CSN: [id number] Age: 85 Admit Type: Outpatient Procedure:                Upper GI endoscopy WITH COLD FORCEPS BIOPSY Indications:              Follow-up of Barrett's esophagus-INDETERMINATE                            DYSPLASIA ON 2015. NO DYSPLASIA IN 2017. Providers:                Barney Drain MD, MD, Charlsie Quest. Theda Sers RN, RN, Lurline Del, RN Referring MD:             Edwinna Areola. [place] Crane MD Medicines:                Meperidine 75 mg IV, Midazolam 4 mg IV Complications:            No immediate complications. Estimated Blood Loss:     Estimated blood loss was minimal. Procedure:                Pre-Anesthesia Assessment:                           - Prior to the procedure, a History and Physical                            was performed, and patient medications and                            allergies were reviewed. The patient's tolerance of                            previous anesthesia was also reviewed. The risks                            and benefits of the procedure and the sedation                            options and risks were discussed with the patient.                            All questions were answered, and informed consent                            was obtained. Prior Anticoagulants: The patient has                            taken no previous anticoagulant or antiplatelet                            agents. ASA Grade Assessment: II - A patient with  mild systemic disease. After reviewing the risks                            and benefits, the patient was deemed in                            satisfactory condition to undergo the procedure.                            After obtaining informed consent, the endoscope was                            passed under direct vision.  Throughout the                            procedure, the patient's blood pressure, pulse, and                            oxygen saturations were monitored continuously. The                            [id number]-H190 ([id number]) scope was introduced through the                            mouth, and advanced to the second part of duodenum.                            The upper GI endoscopy was accomplished without                            difficulty. The patient tolerated the procedure                            well. Scope In: 12:12:06 PM Scope Out: 12:28:38 PM Total Procedure Duration: 0 hours 16 minutes 32 seconds  Findings:      The esophagus and gastroesophageal junction were examined with white       light and narrow band imaging (NBI). There were esophageal mucosal       changes secondary to established long-segment Barrett's disease. These       changes involved the mucosa extending to the Z-line (34 cm from the       incisors). Circumferential salmon-colored mucosa was present from 34 to       40 cm. The maximum longitudinal extent of these esophageal mucosal       changes was 6 cm in length. Mucosa was biopsied with a cold forceps for       histology in 4 quadrants at intervals of 2 cm from 34 to 38 cm from the       incisors. A total of 3 specimen bottles were sent to pathology.      A medium-sized hiatal hernia was present.      A few small sessile polyps with no bleeding and no stigmata of recent       bleeding were found in the gastric body.      Patchy mild inflammation characterized by congestion (edema) and  erythema was found in the gastric antrum.      The examined duodenum was normal. Impression:               - Esophageal mucosal changes secondary to                            established long-segment Barrett's disease.                            Biopsied.                           - Medium-sized hiatal hernia.                           - A few gastric polyps.                            - MILD Gastritis. Moderate Sedation:      Moderate (conscious) sedation was administered by the endoscopy nurse       and supervised by the endoscopist. The following parameters were       monitored: oxygen saturation, heart rate, blood pressure, and response       to care. Total physician intraservice time was 30 minutes. Recommendation:           - Patient has a contact number available for                            emergencies. The signs and symptoms of potential                            delayed complications were discussed with the                            patient. Return to normal activities tomorrow.                            Written discharge instructions were provided to the                            patient.                           - High fiber diet.                           - Continue present medications.                           - Await pathology results.                           - NO NEED FOR ADDITIONAL SURVEILLANCE FOR COLON                            CANCER OR BARRETT'S ESOPHAGUS DUE TO AGE > 75. Procedure Code(s):        ---  Professional ---                           [phone], Esophagogastroduodenoscopy, flexible,                            transoral; with biopsy, single or multiple                           99153, Moderate sedation; each additional 15                            minutes intraservice time                           G0500, Moderate sedation services provided by the                            same physician or other qualified health care                            professional performing a gastrointestinal                            endoscopic service that sedation supports,                            requiring the presence of an independent trained                            observer to assist in the monitoring of the                            patient's level of consciousness and physiological                            status;  initial 15 minutes of intra-service time;                            patient age 30 years or older (additional time may                            be reported with [phone], as appropriate) Diagnosis Code(s):        --- Professional ---                           K22.70, Barrett's esophagus without dysplasia                           K44.9, Diaphragmatic hernia without obstruction or                            gangrene                           K31.7, Polyp of stomach and duodenum  K29.70, Gastritis, unspecified, without bleeding CPT copyright 2019 American Medical Association. All rights reserved. The codes documented in this report are preliminary and upon coder review may  be revised to meet current compliance requirements. Barney Drain, MD Barney Drain MD, MD 03/27/2019 12:51:57 PM This report has been signed electronically. Number of Addenda: 0

## 2019-03-31 ENCOUNTER — Telehealth: Payer: Self-pay | Admitting: Gastroenterology

## 2019-03-31 NOTE — Telephone Encounter (Signed)
Please call pt. The biopsies show Barrett's ESOPHAGUS AND THERE ARE NO PRECANCEROUS CHANGES.

## 2019-04-02 DIAGNOSIS — H02831 Dermatochalasis of right upper eyelid: Secondary | ICD-10-CM | POA: Diagnosis not present

## 2019-04-02 DIAGNOSIS — H25813 Combined forms of age-related cataract, bilateral: Secondary | ICD-10-CM | POA: Diagnosis not present

## 2019-04-02 DIAGNOSIS — H5703 Miosis: Secondary | ICD-10-CM | POA: Diagnosis not present

## 2019-04-02 DIAGNOSIS — H02834 Dermatochalasis of left upper eyelid: Secondary | ICD-10-CM | POA: Diagnosis not present

## 2019-04-02 DIAGNOSIS — H353111 Nonexudative age-related macular degeneration, right eye, early dry stage: Secondary | ICD-10-CM | POA: Diagnosis not present

## 2019-04-02 DIAGNOSIS — H2181 Floppy iris syndrome: Secondary | ICD-10-CM | POA: Diagnosis not present

## 2019-04-02 DIAGNOSIS — H43822 Vitreomacular adhesion, left eye: Secondary | ICD-10-CM | POA: Diagnosis not present

## 2019-04-03 ENCOUNTER — Encounter (HOSPITAL_COMMUNITY): Payer: Self-pay | Admitting: Gastroenterology

## 2019-04-03 NOTE — Telephone Encounter (Signed)
LMOM to call.

## 2019-04-06 ENCOUNTER — Telehealth: Payer: Self-pay | Admitting: Gastroenterology

## 2019-04-06 NOTE — Telephone Encounter (Signed)
Pt was returning a call from Friday . [phone]

## 2019-04-06 NOTE — Telephone Encounter (Signed)
See results note. 

## 2019-04-06 NOTE — Telephone Encounter (Signed)
Results printed and given to pt when he walked in the office.

## 2019-06-04 DIAGNOSIS — Z23 Encounter for immunization: Secondary | ICD-10-CM | POA: Diagnosis not present

## 2019-06-25 DIAGNOSIS — K219 Gastro-esophageal reflux disease without esophagitis: Secondary | ICD-10-CM | POA: Diagnosis not present

## 2019-06-25 DIAGNOSIS — G4733 Obstructive sleep apnea (adult) (pediatric): Secondary | ICD-10-CM | POA: Diagnosis not present

## 2019-06-25 DIAGNOSIS — R5383 Other fatigue: Secondary | ICD-10-CM | POA: Diagnosis not present

## 2019-06-25 DIAGNOSIS — R7301 Impaired fasting glucose: Secondary | ICD-10-CM | POA: Diagnosis not present

## 2019-06-25 DIAGNOSIS — N400 Enlarged prostate without lower urinary tract symptoms: Secondary | ICD-10-CM | POA: Diagnosis not present

## 2019-06-25 DIAGNOSIS — C61 Malignant neoplasm of prostate: Secondary | ICD-10-CM | POA: Diagnosis not present

## 2019-07-29 ENCOUNTER — Other Ambulatory Visit: Payer: Self-pay

## 2019-08-07 DIAGNOSIS — C61 Malignant neoplasm of prostate: Secondary | ICD-10-CM | POA: Diagnosis not present

## 2019-08-14 ENCOUNTER — Other Ambulatory Visit: Payer: Self-pay

## 2019-08-14 ENCOUNTER — Ambulatory Visit (INDEPENDENT_AMBULATORY_CARE_PROVIDER_SITE_OTHER): Payer: Medicare Other | Admitting: Urology

## 2019-08-14 DIAGNOSIS — C61 Malignant neoplasm of prostate: Secondary | ICD-10-CM

## 2019-08-14 DIAGNOSIS — R350 Frequency of micturition: Secondary | ICD-10-CM | POA: Diagnosis not present

## 2019-08-14 DIAGNOSIS — R972 Elevated prostate specific antigen [PSA]: Secondary | ICD-10-CM

## 2019-08-14 DIAGNOSIS — N401 Enlarged prostate with lower urinary tract symptoms: Secondary | ICD-10-CM

## 2019-09-14 ENCOUNTER — Other Ambulatory Visit: Payer: Self-pay

## 2019-09-14 DIAGNOSIS — R972 Elevated prostate specific antigen [PSA]: Secondary | ICD-10-CM

## 2019-09-18 DIAGNOSIS — Z23 Encounter for immunization: Secondary | ICD-10-CM | POA: Diagnosis not present

## 2019-09-21 DIAGNOSIS — H8110 Benign paroxysmal vertigo, unspecified ear: Secondary | ICD-10-CM | POA: Diagnosis not present

## 2019-10-05 DIAGNOSIS — R7301 Impaired fasting glucose: Secondary | ICD-10-CM | POA: Diagnosis not present

## 2019-10-05 DIAGNOSIS — C61 Malignant neoplasm of prostate: Secondary | ICD-10-CM | POA: Diagnosis not present

## 2019-10-05 DIAGNOSIS — R7303 Prediabetes: Secondary | ICD-10-CM | POA: Diagnosis not present

## 2019-10-12 DIAGNOSIS — R7301 Impaired fasting glucose: Secondary | ICD-10-CM | POA: Diagnosis not present

## 2019-10-12 DIAGNOSIS — C61 Malignant neoplasm of prostate: Secondary | ICD-10-CM | POA: Diagnosis not present

## 2019-10-12 DIAGNOSIS — N400 Enlarged prostate without lower urinary tract symptoms: Secondary | ICD-10-CM | POA: Diagnosis not present

## 2019-10-12 DIAGNOSIS — G4733 Obstructive sleep apnea (adult) (pediatric): Secondary | ICD-10-CM | POA: Diagnosis not present

## 2019-10-12 DIAGNOSIS — R5383 Other fatigue: Secondary | ICD-10-CM | POA: Diagnosis not present

## 2019-10-12 DIAGNOSIS — Z0001 Encounter for general adult medical examination with abnormal findings: Secondary | ICD-10-CM | POA: Diagnosis not present

## 2019-10-12 DIAGNOSIS — K219 Gastro-esophageal reflux disease without esophagitis: Secondary | ICD-10-CM | POA: Diagnosis not present

## 2019-10-16 DIAGNOSIS — Z23 Encounter for immunization: Secondary | ICD-10-CM | POA: Diagnosis not present

## 2020-01-22 ENCOUNTER — Other Ambulatory Visit: Payer: Self-pay | Admitting: Nurse Practitioner

## 2020-01-22 DIAGNOSIS — Z0000 Encounter for general adult medical examination without abnormal findings: Secondary | ICD-10-CM

## 2020-01-22 DIAGNOSIS — K2270 Barrett's esophagus without dysplasia: Secondary | ICD-10-CM

## 2020-02-12 ENCOUNTER — Ambulatory Visit: Payer: Medicare Other | Admitting: Urology

## 2020-02-19 ENCOUNTER — Ambulatory Visit: Payer: Medicare Other | Admitting: Urology

## 2020-03-04 ENCOUNTER — Other Ambulatory Visit: Payer: Self-pay | Admitting: Urology

## 2020-03-04 DIAGNOSIS — R972 Elevated prostate specific antigen [PSA]: Secondary | ICD-10-CM | POA: Diagnosis not present

## 2020-03-04 LAB — PSA: PSA: 3.5 ng/mL (ref <=4.0)

## 2020-03-11 ENCOUNTER — Other Ambulatory Visit: Payer: Self-pay

## 2020-03-11 ENCOUNTER — Ambulatory Visit (INDEPENDENT_AMBULATORY_CARE_PROVIDER_SITE_OTHER): Payer: Medicare Other | Admitting: Urology

## 2020-03-11 VITALS — BP 102/56 | HR 94 | Temp 98.7°F | Ht 67.0 in | Wt 200.0 lb

## 2020-03-11 DIAGNOSIS — N138 Other obstructive and reflux uropathy: Secondary | ICD-10-CM | POA: Insufficient documentation

## 2020-03-11 DIAGNOSIS — N401 Enlarged prostate with lower urinary tract symptoms: Secondary | ICD-10-CM | POA: Diagnosis not present

## 2020-03-11 DIAGNOSIS — R351 Nocturia: Secondary | ICD-10-CM | POA: Diagnosis not present

## 2020-03-11 DIAGNOSIS — R972 Elevated prostate specific antigen [PSA]: Secondary | ICD-10-CM

## 2020-03-11 DIAGNOSIS — C61 Malignant neoplasm of prostate: Secondary | ICD-10-CM | POA: Diagnosis not present

## 2020-03-11 MED ORDER — FINASTERIDE 5 MG PO TABS: 5.0000 mg | ORAL_TABLET | Freq: Every day | ORAL | 3 refills | Status: DC

## 2020-03-11 MED ORDER — TAMSULOSIN HCL 0.4 MG PO CAPS: 0.4000 mg | ORAL_CAPSULE | Freq: Every day | ORAL | 3 refills | Status: DC

## 2020-03-11 NOTE — Progress Notes (Signed)

## 2020-03-11 NOTE — Progress Notes (Signed)
Subjective:  1. Prostate cancer ([place])   2. Elevated PSA   3. BPH with urinary obstruction   4. Nocturia      Jerry Macdonald is a 86 year-old male established patient who is here evaluation for treatment of prostate cancer.  His prostate cancer was diagnosed 10/04/2014. His PSA at his time of diagnosis was 6. His most recent PSA is 3.5.   He has not undergone surgery for treatment. He has not undergone External Beam Radiation Therapy for treatment. He has not undergone Hormonal Therapy for treatment.   He has not recently had unwanted weight loss. He is not having pain in new locations.   Jerry Macdonald returns today in f/u. He is AS from low volume Gleason 7(3+4) disease found on surveillance biopsy in 8/17. He had a single core Gleason 6 on his initial biopsy in 2016. He has been seen by Dr. Tammi Klippel and he felt continued AS was reasonable. He remains on finasteride and his PSA is back down to 3.5 from 3.8 at his last visit. He remains on tamsulosin. He is voiding well. He has nocturia x 2. He has a good stream without hesitancy. He has less urgency except in the morning. He does have a sensation of incomplete emptying and he can have a weak stream. His IPSS is 9. He has no associated signs or symptoms.   He was originally diagnosed in 2/16 with a single core of Gleason 6, 10% on the left mid lateral core. His PSA has risen very slowly and was 6.52 prior to a repeat biopsyin 8/17. The repeat biopsy showed a 179ml prostate and there were 3 cores positive with 5% Gleason 6 in the left lateral base, 10% Gleason 7(3+4) in the left lateral apex and 20% Gleason 6 in the right medial apex.     IPSS    Row Name 03/11/20 1400         International Prostate Symptom Score   How often have you had the sensation of not emptying your bladder? Less than 1 in 5     How often have you had to urinate less than every two hours? About half the time     How often have you found you stopped and started again  several times when you urinated? Less than half the time     How often have you found it difficult to postpone urination? Not at All     How often have you had a weak urinary stream? Less than 1 in 5 times     How often have you had to strain to start urination? Not at All     How many times did you typically get up at night to urinate? 2 Times     Total IPSS Score 9       Quality of Life due to urinary symptoms   If you were to spend the rest of your life with your urinary condition just the way it is now how would you feel about that? Mostly Satisfied             ROS:  ROS:  A complete review of systems was performed.  All systems are negative except for pertinent findings as noted.   ROS  Allergies  Allergen Reactions  . Penicillins     Swelling of hands and feet - severe Did it involve swelling of the face/tongue/throat, SOB, or low BP? No Did it involve sudden or severe rash/hives, skin peeling, or any reaction  on the inside of your mouth or nose? No Did you need to seek medical attention at a hospital or doctor's office? No When did it last happen?1950s If all above answers are "NO", may proceed with cephalosporin use.     Outpatient Encounter Medications as of 03/11/2020  Medication Sig  . finasteride (PROSCAR) 5 MG tablet Take 1 tablet (5 mg total) by mouth daily.  . magnesium oxide (MAG-OX) 400 MG tablet Take 800 mg by mouth daily.   . Multiple Vitamins-Minerals (MULTIVITAMINS THER. W/MINERALS) TABS Take 1 tablet by mouth daily.    . pantoprazole (PROTONIX) 40 MG tablet TAKE 1 TABLET TWICE A DAY BEFORE MEALS  . tamsulosin (FLOMAX) 0.4 MG CAPS capsule Take 1 capsule (0.4 mg total) by mouth daily.  . [DISCONTINUED] finasteride (PROSCAR) 5 MG tablet Take 5 mg by mouth daily.   . [DISCONTINUED] tamsulosin (FLOMAX) 0.4 MG CAPS capsule Take 0.4 mg by mouth daily.    No facility-administered encounter medications on file as of 03/11/2020.    Past Medical History:   Diagnosis Date  . Abnormal prostate biopsy   . Back pain    with radiculopathy  . Cataracts, bilateral   . Constipation   . Hyperlipidemia   . Hypertrophy of prostate   . Malaise and fatigue   . Obesity   . Pyogenic granuloma    left cheeck  . S/P colonoscopy June 2011   rectal bleeding secondary to hemorrhoids  . S/P colonoscopy 2007   [facility], diverticulosis per Dr. Juel Burrow note  . S/P endoscopy June 2011   Barrett's, mild gastritis and duodenitis  . Seborrheic keratosis   . Sleep apnea     Past Surgical History:  Procedure Laterality Date  . BIOPSY  03/27/2019   Procedure: BIOPSY;  Surgeon: Danie Binder, MD;  [facility];  Service: Endoscopy;;  . COLONOSCOPY  JUN 2011   PAN-COLONIC DIVERTICULOSIS  . COLONOSCOPY  2007   [place]  . ESOPHAGOGASTRODUODENOSCOPY  06/11/2011   Procedure: ESOPHAGOGASTRODUODENOSCOPY (EGD);  Surgeon: Dorothyann Peng, MD;  [facility];  Service: Endoscopy;  Laterality: N/A;  11:00  . ESOPHAGOGASTRODUODENOSCOPY N/A 05/21/2014   Dr. Fields:Barrett's esophagus/ Medium sized hiatal hernia. path with indefinite dysplasia.   Marland Kitchen ESOPHAGOGASTRODUODENOSCOPY N/A 12/20/2014   Dr. Clayburn Pert gastric polyp/large HH/6 cm segment of Barrett's esophagus, path with hyperplastic gastric polyp, no dysplasia. Surveillance April 2017 due   . ESOPHAGOGASTRODUODENOSCOPY N/A 01/20/2016   Procedure: ESOPHAGOGASTRODUODENOSCOPY (EGD);  Surgeon: Danie Binder, MD;  [facility];  Service: Endoscopy;  Laterality: N/A;  1230pm  . ESOPHAGOGASTRODUODENOSCOPY N/A 03/27/2019   Procedure: ESOPHAGOGASTRODUODENOSCOPY (EGD);  Surgeon: Danie Binder, MD;  [facility];  Service: Endoscopy;  Laterality: N/A;  12:15pm  . HERNIA REPAIR    . UMBILICAL HERNIA REPAIR    . VARICOSE VEIN SURGERY      Social History   Socioeconomic History  . Marital status: Divorced    Spouse name: Not on file  . Number of children: Not on file  . Years  of education: Not on file  . Highest education level: Not on file  Occupational History  . Not on file  Tobacco Use  . Smoking status: Former Smoker    Packs/day: 0.50    Types: Cigarettes  . Smokeless tobacco: Never Used  . Tobacco comment: quit in 1965  Substance and Sexual Activity  . Alcohol use: Yes    Comment: 2-3 oz red wine a day  .  Drug use: No  . Sexual activity: Not on file  Other Topics Concern  . Not on file  Social History Narrative  . Not on file   Social Determinants of Health   Financial Resource Strain:   . Difficulty of Paying Living Expenses:   Food Insecurity:   . Worried About Charity fundraiser in the Last Year:   . Arboriculturist in the Last Year:   Transportation Needs:   . Film/video editor (Medical):   Marland Kitchen Lack of Transportation (Non-Medical):   Physical Activity:   . Days of Exercise per Week:   . Minutes of Exercise per Session:   Stress:   . Feeling of Stress :   Social Connections:   . Frequency of Communication with Friends and Family:   . Frequency of Social Gatherings with Friends and Family:   . Attends Religious Services:   . Active Member of Clubs or Organizations:   . Attends Archivist Meetings:   Marland Kitchen Marital Status:   Intimate Partner Violence:   . Fear of Current or Ex-Partner:   . Emotionally Abused:   Marland Kitchen Physically Abused:   . Sexually Abused:     Family History  Problem Relation Age of Onset  . Colon polyps Neg Hx   . Colon cancer Neg Hx        Objective: Vitals:   03/11/20 1408  BP: (!) 102/56  Pulse: 94  Temp: 98.7 F (37.1 C)     Physical Exam  Lab Results:  No results found for this or any previous visit (from the past 24 hour(s)).  BMET No results for input(s): NA, K, CL, CO2, GLUCOSE, BUN, CREATININE, CALCIUM in the last 72 hours. PSA 3/5 on 03/04/20`  No results found for: TESTOSTERONE    Studies/Results: No results found.    Assessment & Plan: Prostate cancer ([place]) His  PSA remains suppressed with the finasteride and he will return in 6 months with a PSA.    BPH with urinary obstruction He is voiding well on finasteride and tamsulosin which were refilled.     Meds ordered this encounter  Medications  . finasteride (PROSCAR) 5 MG tablet    Sig: Take 1 tablet (5 mg total) by mouth daily.    Dispense:  90 tablet    Refill:  3  . tamsulosin (FLOMAX) 0.4 MG CAPS capsule    Sig: Take 1 capsule (0.4 mg total) by mouth daily.    Dispense:  90 capsule    Refill:  3     Orders Placed This Encounter  Procedures  . PSA    Standing Status:   Future    Standing Expiration Date:   03/11/2021      Return in about 6 months (around 09/11/2020) for with PSA.   CC: [name], MD      Irine Seal 03/11/2020

## 2020-03-11 NOTE — Assessment & Plan Note (Signed)
His PSA remains suppressed with the finasteride and he will return in 6 months with a PSA.

## 2020-03-11 NOTE — Assessment & Plan Note (Signed)
He is voiding well on finasteride and tamsulosin which were refilled.

## 2020-03-18 ENCOUNTER — Other Ambulatory Visit: Payer: Self-pay | Admitting: Urology

## 2020-04-07 DIAGNOSIS — H43822 Vitreomacular adhesion, left eye: Secondary | ICD-10-CM | POA: Diagnosis not present

## 2020-04-07 DIAGNOSIS — H25813 Combined forms of age-related cataract, bilateral: Secondary | ICD-10-CM | POA: Diagnosis not present

## 2020-04-07 DIAGNOSIS — H5703 Miosis: Secondary | ICD-10-CM | POA: Diagnosis not present

## 2020-04-07 DIAGNOSIS — H02831 Dermatochalasis of right upper eyelid: Secondary | ICD-10-CM | POA: Diagnosis not present

## 2020-04-07 DIAGNOSIS — H2181 Floppy iris syndrome: Secondary | ICD-10-CM | POA: Diagnosis not present

## 2020-04-07 DIAGNOSIS — H02834 Dermatochalasis of left upper eyelid: Secondary | ICD-10-CM | POA: Diagnosis not present

## 2020-04-07 DIAGNOSIS — H353111 Nonexudative age-related macular degeneration, right eye, early dry stage: Secondary | ICD-10-CM | POA: Diagnosis not present

## 2020-04-11 DIAGNOSIS — C61 Malignant neoplasm of prostate: Secondary | ICD-10-CM | POA: Diagnosis not present

## 2020-04-11 DIAGNOSIS — N400 Enlarged prostate without lower urinary tract symptoms: Secondary | ICD-10-CM | POA: Diagnosis not present

## 2020-04-11 DIAGNOSIS — Z6833 Body mass index (BMI) 33.0-33.9, adult: Secondary | ICD-10-CM | POA: Diagnosis not present

## 2020-04-11 DIAGNOSIS — Z6832 Body mass index (BMI) 32.0-32.9, adult: Secondary | ICD-10-CM | POA: Diagnosis not present

## 2020-04-11 DIAGNOSIS — R7303 Prediabetes: Secondary | ICD-10-CM | POA: Diagnosis not present

## 2020-04-11 DIAGNOSIS — G4730 Sleep apnea, unspecified: Secondary | ICD-10-CM | POA: Diagnosis not present

## 2020-04-11 DIAGNOSIS — R1010 Upper abdominal pain, unspecified: Secondary | ICD-10-CM | POA: Diagnosis not present

## 2020-04-11 DIAGNOSIS — R7301 Impaired fasting glucose: Secondary | ICD-10-CM | POA: Diagnosis not present

## 2020-04-11 DIAGNOSIS — K219 Gastro-esophageal reflux disease without esophagitis: Secondary | ICD-10-CM | POA: Diagnosis not present

## 2020-04-11 DIAGNOSIS — R5383 Other fatigue: Secondary | ICD-10-CM | POA: Diagnosis not present

## 2020-04-11 DIAGNOSIS — G4733 Obstructive sleep apnea (adult) (pediatric): Secondary | ICD-10-CM | POA: Diagnosis not present

## 2020-04-18 DIAGNOSIS — N400 Enlarged prostate without lower urinary tract symptoms: Secondary | ICD-10-CM | POA: Diagnosis not present

## 2020-04-18 DIAGNOSIS — Z6832 Body mass index (BMI) 32.0-32.9, adult: Secondary | ICD-10-CM | POA: Diagnosis not present

## 2020-04-18 DIAGNOSIS — K219 Gastro-esophageal reflux disease without esophagitis: Secondary | ICD-10-CM | POA: Diagnosis not present

## 2020-04-18 DIAGNOSIS — C61 Malignant neoplasm of prostate: Secondary | ICD-10-CM | POA: Diagnosis not present

## 2020-04-18 DIAGNOSIS — D649 Anemia, unspecified: Secondary | ICD-10-CM | POA: Diagnosis not present

## 2020-04-18 DIAGNOSIS — R1010 Upper abdominal pain, unspecified: Secondary | ICD-10-CM | POA: Diagnosis not present

## 2020-04-18 DIAGNOSIS — E785 Hyperlipidemia, unspecified: Secondary | ICD-10-CM | POA: Diagnosis not present

## 2020-04-18 DIAGNOSIS — Z6833 Body mass index (BMI) 33.0-33.9, adult: Secondary | ICD-10-CM | POA: Diagnosis not present

## 2020-04-18 DIAGNOSIS — G4733 Obstructive sleep apnea (adult) (pediatric): Secondary | ICD-10-CM | POA: Diagnosis not present

## 2020-04-18 DIAGNOSIS — R7301 Impaired fasting glucose: Secondary | ICD-10-CM | POA: Diagnosis not present

## 2020-04-18 DIAGNOSIS — G4730 Sleep apnea, unspecified: Secondary | ICD-10-CM | POA: Diagnosis not present

## 2020-04-18 DIAGNOSIS — R5383 Other fatigue: Secondary | ICD-10-CM | POA: Diagnosis not present

## 2020-04-18 DIAGNOSIS — R7303 Prediabetes: Secondary | ICD-10-CM | POA: Diagnosis not present

## 2020-04-21 DIAGNOSIS — R5383 Other fatigue: Secondary | ICD-10-CM | POA: Diagnosis not present

## 2020-04-21 DIAGNOSIS — C61 Malignant neoplasm of prostate: Secondary | ICD-10-CM | POA: Diagnosis not present

## 2020-04-21 DIAGNOSIS — R7301 Impaired fasting glucose: Secondary | ICD-10-CM | POA: Diagnosis not present

## 2020-04-21 DIAGNOSIS — G4733 Obstructive sleep apnea (adult) (pediatric): Secondary | ICD-10-CM | POA: Diagnosis not present

## 2020-04-21 DIAGNOSIS — K219 Gastro-esophageal reflux disease without esophagitis: Secondary | ICD-10-CM | POA: Diagnosis not present

## 2020-04-21 DIAGNOSIS — N400 Enlarged prostate without lower urinary tract symptoms: Secondary | ICD-10-CM | POA: Diagnosis not present

## 2020-05-12 DIAGNOSIS — Z23 Encounter for immunization: Secondary | ICD-10-CM | POA: Diagnosis not present

## 2020-05-20 DIAGNOSIS — N400 Enlarged prostate without lower urinary tract symptoms: Secondary | ICD-10-CM | POA: Diagnosis not present

## 2020-05-20 DIAGNOSIS — C61 Malignant neoplasm of prostate: Secondary | ICD-10-CM | POA: Diagnosis not present

## 2020-05-20 DIAGNOSIS — R1010 Upper abdominal pain, unspecified: Secondary | ICD-10-CM | POA: Diagnosis not present

## 2020-05-20 DIAGNOSIS — R5383 Other fatigue: Secondary | ICD-10-CM | POA: Diagnosis not present

## 2020-05-20 DIAGNOSIS — G4730 Sleep apnea, unspecified: Secondary | ICD-10-CM | POA: Diagnosis not present

## 2020-05-20 DIAGNOSIS — Z23 Encounter for immunization: Secondary | ICD-10-CM | POA: Diagnosis not present

## 2020-05-20 DIAGNOSIS — Z6832 Body mass index (BMI) 32.0-32.9, adult: Secondary | ICD-10-CM | POA: Diagnosis not present

## 2020-05-20 DIAGNOSIS — Z6833 Body mass index (BMI) 33.0-33.9, adult: Secondary | ICD-10-CM | POA: Diagnosis not present

## 2020-05-20 DIAGNOSIS — K219 Gastro-esophageal reflux disease without esophagitis: Secondary | ICD-10-CM | POA: Diagnosis not present

## 2020-05-20 DIAGNOSIS — R7303 Prediabetes: Secondary | ICD-10-CM | POA: Diagnosis not present

## 2020-05-20 DIAGNOSIS — G4733 Obstructive sleep apnea (adult) (pediatric): Secondary | ICD-10-CM | POA: Diagnosis not present

## 2020-05-20 DIAGNOSIS — R7301 Impaired fasting glucose: Secondary | ICD-10-CM | POA: Diagnosis not present

## 2020-05-23 DIAGNOSIS — R7303 Prediabetes: Secondary | ICD-10-CM | POA: Diagnosis not present

## 2020-05-23 DIAGNOSIS — R5383 Other fatigue: Secondary | ICD-10-CM | POA: Diagnosis not present

## 2020-05-23 DIAGNOSIS — D649 Anemia, unspecified: Secondary | ICD-10-CM | POA: Diagnosis not present

## 2020-06-07 ENCOUNTER — Encounter: Payer: Self-pay | Admitting: Internal Medicine

## 2020-06-30 ENCOUNTER — Encounter: Payer: Self-pay | Admitting: Internal Medicine

## 2020-06-30 ENCOUNTER — Telehealth: Payer: Self-pay | Admitting: *Deleted

## 2020-06-30 ENCOUNTER — Ambulatory Visit (INDEPENDENT_AMBULATORY_CARE_PROVIDER_SITE_OTHER): Payer: Medicare Other | Admitting: Internal Medicine

## 2020-06-30 ENCOUNTER — Other Ambulatory Visit: Payer: Self-pay

## 2020-06-30 VITALS — BP 118/69 | HR 72 | Temp 97.0°F | Ht 70.0 in | Wt 208.0 lb

## 2020-06-30 DIAGNOSIS — D509 Iron deficiency anemia, unspecified: Secondary | ICD-10-CM | POA: Diagnosis not present

## 2020-06-30 DIAGNOSIS — K2270 Barrett's esophagus without dysplasia: Secondary | ICD-10-CM

## 2020-06-30 NOTE — Telephone Encounter (Signed)
LMOVM for pt to call back to [facility] w/ Dr. Abbey Chatters, ASA 3

## 2020-06-30 NOTE — Progress Notes (Signed)
Primary Care Physician:  [name], MD Primary Gastroenterologist:  Dr. Abbey Chatters  Chief Complaint  Patient presents with  . Anemia    consult [facility]/EGD    HPI:   Jerry Macdonald is a 86 y.o. male who presents to the clinic to discuss iron deficiency anemia.  Recently had blood work performed which showed a hemoglobin of 9.4.  Subsequent iron studies showed iron level of 11, ferritin 18.  Patient denies any melena hematochezia.  He states he performed a stool test which was negative for heme though I do not see this in his chart.  Last colonoscopy in 2011 was unremarkable.  Was actually supposed to undergo colonoscopy last year but this was not performed.  Patient is quite spry for 86 years old.  He walks 2 miles every day without any issues.  No family history of colorectal malignancy.  No unintentional weight loss.  Also has a history of Barrett's esophagus without dysplasia on the last 3 EGDs.  Currently takes Protonix 40 mg daily.  Past Medical History:  Diagnosis Date  . Abnormal prostate biopsy   . Back pain    with radiculopathy  . Cataracts, bilateral   . Constipation   . Hyperlipidemia   . Hypertrophy of prostate   . Malaise and fatigue   . Obesity   . Pyogenic granuloma    left cheeck  . S/P colonoscopy June 2011   rectal bleeding secondary to hemorrhoids  . S/P colonoscopy 2007   [facility], diverticulosis per Dr. Juel Burrow note  . S/P endoscopy June 2011   Barrett's, mild gastritis and duodenitis  . Seborrheic keratosis   . Sleep apnea     Past Surgical History:  Procedure Laterality Date  . BIOPSY  03/27/2019   Procedure: BIOPSY;  Surgeon: Danie Binder, MD;  [facility];  Service: Endoscopy;;  . COLONOSCOPY  JUN 2011   PAN-COLONIC DIVERTICULOSIS  . COL[place]OSCOPY  2007   [place]  . ESOPHAGOGASTRODUODENOSCOPY  06/11/2011   Procedure: ESOPHAGOGASTRODUODENOSCOPY (EGD);  Surgeon: Dorothyann Peng, MD;  [facility];  Service: Endoscopy;   Laterality: N/A;  11:00  . ESOPHAGOGASTRODUODENOSCOPY N/A 05/21/2014   Dr. Fields:Barrett's esophagus/ Medium sized hiatal hernia. path with indefinite dysplasia.   Marland Kitchen ESOPHAGOGASTRODUODENOSCOPY N/A 12/20/2014   Dr. Clayburn Pert gastric polyp/large HH/6 cm segment of Barrett's esophagus, path with hyperplastic gastric polyp, no dysplasia. Surveillance April 2017 due   . ESOPHAGOGASTRODUODENOSCOPY N/A 01/20/2016   Procedure: ESOPHAGOGASTRODUODENOSCOPY (EGD);  Surgeon: Danie Binder, MD;  [facility];  Service: Endoscopy;  Laterality: N/A;  1230pm  . ESOPHAGOGASTRODUODENOSCOPY N/A 03/27/2019   Procedure: ESOPHAGOGASTRODUODENOSCOPY (EGD);  Surgeon: Danie Binder, MD;  [facility];  Service: Endoscopy;  Laterality: N/A;  12:15pm  . HERNIA REPAIR    . UMBILICAL HERNIA REPAIR    . VARICOSE VEIN SURGERY      Current Outpatient Medications  Medication Sig Dispense Refill  . CALCIUM PO Take by mouth daily. With magnesium    . finasteride (PROSCAR) 5 MG tablet Take 1 tablet (5 mg total) by mouth daily. 90 tablet 3  . Multiple Vitamins-Minerals (MULTIVITAMINS THER. W/MINERALS) TABS Take 1 tablet by mouth daily.      . pantoprazole (PROTONIX) 40 MG tablet TAKE 1 TABLET TWICE A DAY BEFORE MEALS 180 tablet 1  . tamsulosin (FLOMAX) 0.4 MG CAPS capsule Take 1 capsule (0.4 mg total) by mouth daily. 90 capsule 3   No current facility-administered  medications for this visit.    Allergies as of 06/30/2020 - Review Complete 06/30/2020  Allergen Reaction Noted  . Penicillins  07/07/2007    Family History  Problem Relation Age of Onset  . Colon polyps Neg Hx   . Colon cancer Neg Hx     Social History   Socioeconomic History  . Marital status: Divorced    Spouse name: Not on file  . Number of children: Not on file  . Years of education: Not on file  . Highest education level: Not on file  Occupational History  . Not on file  Tobacco Use  . Smoking status: Former Smoker     Packs/day: 0.50    Types: Cigarettes  . Smokeless tobacco: Never Used  . Tobacco comment: quit in 1965  Substance and Sexual Activity  . Alcohol use: Yes    Comment: 2-3 oz red wine a day  . Drug use: No  . Sexual activity: Not on file  Other Topics Concern  . Not on file  Social History Narrative  . Not on file   Social Determinants of Health   Financial Resource Strain:   . Difficulty of Paying Living Expenses: Not on file  Food Insecurity:   . Worried About Charity fundraiser in the Last Year: Not on file  . Ran Out of Food in the Last Year: Not on file  Transportation Needs:   . Lack of Transportation (Medical): Not on file  . Lack of Transportation (Non-Medical): Not on file  Physical Activity:   . Days of Exercise per Week: Not on file  . Minutes of Exercise per Session: Not on file  Stress:   . Feeling of Stress : Not on file  Social Connections:   . Frequency of Communication with Friends and Family: Not on file  . Frequency of Social Gatherings with Friends and Family: Not on file  . Attends Religious Services: Not on file  . Active Member of Clubs or Organizations: Not on file  . Attends Archivist Meetings: Not on file  . Marital Status: Not on file  Intimate Partner Violence:   . Fear of Current or Ex-Partner: Not on file  . Emotionally Abused: Not on file  . Physically Abused: Not on file  . Sexually Abused: Not on file    Subjective: Review of Systems  Constitutional: Negative for chills and fever.  HENT: Negative for congestion and hearing loss.   Eyes: Negative for blurred vision and double vision.  Respiratory: Negative for cough and shortness of breath.   Cardiovascular: Negative for chest pain and palpitations.  Gastrointestinal: Negative for abdominal pain, blood in stool, constipation, diarrhea, heartburn, melena and vomiting.  Genitourinary: Negative for dysuria and urgency.  Musculoskeletal: Negative for joint pain and myalgias.    Skin: Negative for itching and rash.  Neurological: Negative for dizziness and headaches.  Psychiatric/Behavioral: Negative for depression. The patient is not nervous/anxious.        Objective: BP 118/69   Pulse 72   Temp (!) 97 F (36.1 C)   Ht 5\' 10"  (1.778 m)   Wt 208 lb (94.3 kg)   BMI 29.84 kg/m  Physical Exam Constitutional:      Appearance: Normal appearance.  HENT:     Head: Normocephalic and atraumatic.  Eyes:     Extraocular Movements: Extraocular movements intact.     Conjunctiva/sclera: Conjunctivae normal.  Cardiovascular:     Rate and Rhythm: Normal rate and regular rhythm.  Pulmonary:     Effort: Pulmonary effort is normal.     Breath sounds: Normal breath sounds.  Abdominal:     General: Bowel sounds are normal.     Palpations: Abdomen is soft.  Musculoskeletal:        General: Normal range of motion.     Cervical back: Normal range of motion and neck supple.  Skin:    General: Skin is warm.  Neurological:     General: No focal deficit present.     Mental Status: He is alert and oriented to person, place, and time.  Psychiatric:        Mood and Affect: Mood normal.        Behavior: Behavior normal.      Assessment: *Iron deficiency anemia *Barrett's esophagus without dysplasia *Chronic reflux  Plan: Will schedule for  diagnostic colonoscopy.The risks including infection, bleed, or perforation as well as benefits, limitations, alternatives and imponderables have been reviewed with the patient. Questions have been answered. All parties agreeable.  EGD 1 year ago relatively unremarkable besides Barrett's with out dysplasia.  We will defer repeat EGD at this time.  Continue on Protonix 40 mg daily for chronic reflux as well as Barrett's esophagus.   06/30/2020 12:19 PM   Disclaimer: This note was dictated with voice recognition software. Similar sounding words can inadvertently be transcribed and may not be corrected upon review.

## 2020-06-30 NOTE — Patient Instructions (Signed)
We will schedule you for colonoscopy to further evaluate your anemia.  Further recommendations to follow.  At [facility] we value your feedback. You may receive a survey about your visit today. Please share your experience as we strive to create trusting relationships with our patients to provide genuine, compassionate, quality care.  We appreciate your understanding and patience as we review any laboratory studies, imaging, and other diagnostic tests that are ordered as we care for you. Our office policy is 5 business days for review of these results, and any emergent or urgent results are addressed in a timely manner for your best interest. If you do not hear from our office in 1 week, please contact us.   We also encourage the use of MyChart, which contains your medical information for your review as well. If you are not enrolled in this feature, an access code is on this after visit summary for your convenience. Thank you for allowing [place] to be involved in your care.  It was great to see you today!  I hope you have a great rest of your fall!!    Thank you for your service.  Elon Alas. Abbey Chatters, D.O. Gastroenterology and Hepatology [facility]

## 2020-07-04 NOTE — Telephone Encounter (Signed)
Pt came by office, TCS scheduled for 08/16/20 at 10:45am.

## 2020-07-04 NOTE — Telephone Encounter (Signed)
Pre-op/covid test 08/12/20. Appt letter mailed with procedure instructions.

## 2020-07-27 ENCOUNTER — Other Ambulatory Visit: Payer: Self-pay | Admitting: Gastroenterology

## 2020-07-27 DIAGNOSIS — K2270 Barrett's esophagus without dysplasia: Secondary | ICD-10-CM

## 2020-07-27 DIAGNOSIS — Z0000 Encounter for general adult medical examination without abnormal findings: Secondary | ICD-10-CM

## 2020-08-08 NOTE — Patient Instructions (Signed)
Jerry Macdonald  08/08/2020     @PREFPERIOPPHARMACY @   Your procedure is scheduled on  08/16/2020.  Report to [name] at  [place].M.  Call this number if you have problems the morning of surgery:  [phone]   Remember:  Follow the diet and prep instructions given to you by the office.                      Take these medicines the morning of surgery with A SIP OF WATER  Proscar, protonix, flomax.    Do not wear jewelry, make-up or nail polish.  Do not wear lotions, powders, or perfumes. Please wear deodorant and brush your teeth.  Do not shave 48 hours prior to surgery.  Men may shave face and neck.  Do not bring valuables to the hospital.  [facility] is not responsible for any belongings or valuables.  Contacts, dentures or bridgework may not be worn into surgery.  Leave your suitcase in the car.  After surgery it may be brought to your room.  For patients admitted to the hospital, discharge time will be determined by your treatment team.  Patients discharged the day of surgery will not be allowed to drive home.   Name and phone number of your driver:   family Special instructions:  DO NOT smoke the morning of your procedure.  Please read over the following fact sheets that you were given. Anesthesia Post-op Instructions and Care and Recovery After Surgery       Colonoscopy, Adult, Care After This sheet gives you information about how to care for yourself after your procedure. Your health care provider may also give you more specific instructions. If you have problems or questions, contact your health care provider. What can I expect after the procedure? After the procedure, it is common to have:  A small amount of blood in your stool for 24 hours after the procedure.  Some gas.  Mild cramping or bloating of your abdomen. Follow these instructions at home: Eating and drinking   Drink enough fluid to keep your urine pale yellow.  Follow  instructions from your health care provider about eating or drinking restrictions.  Resume your normal diet as instructed by your health care provider. Avoid heavy or fried foods that are hard to digest. Activity  Rest as told by your health care provider.  Avoid sitting for a long time without moving. Get up to take short walks every 1-2 hours. This is important to improve blood flow and breathing. Ask for help if you feel weak or unsteady.  Return to your normal activities as told by your health care provider. Ask your health care provider what activities are safe for you. Managing cramping and bloating   Try walking around when you have cramps or feel bloated.  Apply heat to your abdomen as told by your health care provider. Use the heat source that your health care provider recommends, such as a moist heat pack or a heating pad. ? Place a towel between your skin and the heat source. ? Leave the heat on for 20-30 minutes. ? Remove the heat if your skin turns bright red. This is especially important if you are unable to feel pain, heat, or cold. You may have a greater risk of getting burned. General instructions  For the first 24 hours after the procedure: ? Do not drive or use machinery. ? Do not sign  important documents. ? Do not drink alcohol. ? Do your regular daily activities at a slower pace than normal. ? Eat soft foods that are easy to digest.  Take over-the-counter and prescription medicines only as told by your health care provider.  Keep all follow-up visits as told by your health care provider. This is important. Contact a health care provider if:  You have blood in your stool 2-3 days after the procedure. Get help right away if you have:  More than a small spotting of blood in your stool.  Large blood clots in your stool.  Swelling of your abdomen.  Nausea or vomiting.  A fever.  Increasing pain in your abdomen that is not relieved with medicine. Summary   After the procedure, it is common to have a small amount of blood in your stool. You may also have mild cramping and bloating of your abdomen.  For the first 24 hours after the procedure, do not drive or use machinery, sign important documents, or drink alcohol.  Get help right away if you have a lot of blood in your stool, nausea or vomiting, a fever, or increased pain in your abdomen. This information is not intended to replace advice given to you by your health care provider. Make sure you discuss any questions you have with your health care provider. Document Revised: 03/16/2019 Document Reviewed: 03/16/2019 Elsevier Patient Education  [place] After These instructions provide you with information about caring for yourself after your procedure. Your health care provider may also give you more specific instructions. Your treatment has been planned according to current medical practices, but problems sometimes occur. Call your health care provider if you have any problems or questions after your procedure. What can I expect after the procedure? After your procedure, you may:  Feel sleepy for several hours.  Feel clumsy and have poor balance for several hours.  Feel forgetful about what happened after the procedure.  Have poor judgment for several hours.  Feel nauseous or vomit.  Have a sore throat if you had a breathing tube during the procedure. Follow these instructions at home: For at least 24 hours after the procedure:      Have a responsible adult stay with you. It is important to have someone help care for you until you are awake and alert.  Rest as needed.  Do not: ? Participate in activities in which you could fall or become injured. ? Drive. ? Use heavy machinery. ? Drink alcohol. ? Take sleeping pills or medicines that cause drowsiness. ? Make important decisions or sign legal documents. ? Take care of children on your  own. Eating and drinking  Follow the diet that is recommended by your health care provider.  If you vomit, drink water, juice, or soup when you can drink without vomiting.  Make sure you have little or no nausea before eating solid foods. General instructions  Take over-the-counter and prescription medicines only as told by your health care provider.  If you have sleep apnea, surgery and certain medicines can increase your risk for breathing problems. Follow instructions from your health care provider about wearing your sleep device: ? Anytime you are sleeping, including during daytime naps. ? While taking prescription pain medicines, sleeping medicines, or medicines that make you drowsy.  If you smoke, do not smoke without supervision.  Keep all follow-up visits as told by your health care provider. This is important. Contact a health care provider if:  You keep feeling nauseous or you keep vomiting.  You feel light-headed.  You develop a rash.  You have a fever. Get help right away if:  You have trouble breathing. Summary  For several hours after your procedure, you may feel sleepy and have poor judgment.  Have a responsible adult stay with you for at least 24 hours or until you are awake and alert. This information is not intended to replace advice given to you by your health care provider. Make sure you discuss any questions you have with your health care provider. Document Revised: 11/18/2017 Document Reviewed: 12/11/2015 Elsevier Patient Education  [place].

## 2020-08-12 ENCOUNTER — Other Ambulatory Visit (HOSPITAL_COMMUNITY)
Admission: RE | Admit: 2020-08-12 | Discharge: 2020-08-12 | Disposition: A | Discharge status: Home / Self Care | Payer: Medicare Other | Source: Ambulatory Visit | Attending: Internal Medicine | Admitting: Internal Medicine

## 2020-08-12 ENCOUNTER — Other Ambulatory Visit: Payer: Self-pay

## 2020-08-12 ENCOUNTER — Encounter (HOSPITAL_COMMUNITY): Payer: Self-pay

## 2020-08-12 ENCOUNTER — Encounter (HOSPITAL_COMMUNITY)
Admission: RE | Admit: 2020-08-12 | Discharge: 2020-08-12 | Disposition: A | Discharge status: Home / Self Care | Payer: Medicare Other | Source: Ambulatory Visit | Attending: Internal Medicine | Admitting: Internal Medicine

## 2020-08-12 DIAGNOSIS — Z01818 Encounter for other preprocedural examination: Secondary | ICD-10-CM | POA: Diagnosis not present

## 2020-08-12 DIAGNOSIS — Z20822 Contact with and (suspected) exposure to covid-19: Secondary | ICD-10-CM | POA: Diagnosis not present

## 2020-08-12 LAB — CBC WITH DIFFERENTIAL/PLATELET
Abs Immature Granulocytes: 0.01 10*3/uL (ref 0.00–0.07)
Basophils Absolute: 0 10*3/uL (ref 0.0–0.1)
Basophils Relative: 0 %
Eosinophils Absolute: 0.1 10*3/uL (ref 0.0–0.5)
Eosinophils Relative: 2 %
HCT: 36.2 % — ABNORMAL LOW (ref 39.0–52.0)
Hemoglobin: 11.2 g/dL — ABNORMAL LOW (ref 13.0–17.0)
Immature Granulocytes: 0 %
Lymphocytes Relative: 22 %
Lymphs Abs: 1.2 10*3/uL (ref 0.7–4.0)
MCH: 28.9 pg (ref 26.0–34.0)
MCHC: 30.9 g/dL (ref 30.0–36.0)
MCV: 93.3 fL (ref 80.0–100.0)
Monocytes Absolute: 0.7 10*3/uL (ref 0.1–1.0)
Monocytes Relative: 13 %
Neutro Abs: 3.5 10*3/uL (ref 1.7–7.7)
Neutrophils Relative %: 63 %
Platelets: 225 10*3/uL (ref 150–400)
RBC: 3.88 MIL/uL — ABNORMAL LOW (ref 4.22–5.81)
RDW: 16.3 % — ABNORMAL HIGH (ref 11.5–15.5)
WBC: 5.5 10*3/uL (ref 4.0–10.5)
nRBC: 0 % (ref 0.0–0.2)

## 2020-08-12 LAB — BASIC METABOLIC PANEL
Anion gap: 8 (ref 5–15)
BUN: 20 mg/dL (ref 8–23)
CO2: 26 mmol/L (ref 22–32)
Calcium: 8.6 mg/dL — ABNORMAL LOW (ref 8.9–10.3)
Chloride: 103 mmol/L (ref 98–111)
Creatinine, Ser: 0.78 mg/dL (ref 0.61–1.24)
GFR, Estimated: >60 mL/min (ref >60)
Glucose, Bld: 101 mg/dL — ABNORMAL HIGH (ref 70–99)
Potassium: 4 mmol/L (ref 3.5–5.1)
Sodium: 137 mmol/L (ref 135–145)

## 2020-08-12 LAB — SARS CORONAVIRUS 2 (TAT 6-24 HRS): SARS Coronavirus 2: NEGATIVE

## 2020-08-16 ENCOUNTER — Ambulatory Visit (HOSPITAL_COMMUNITY): Payer: Medicare Other | Admitting: Anesthesiology

## 2020-08-16 ENCOUNTER — Encounter (HOSPITAL_COMMUNITY)
Admission: RE | Disposition: A | Discharge status: Home / Self Care | Payer: Self-pay | Source: Home / Self Care | Attending: Internal Medicine

## 2020-08-16 ENCOUNTER — Encounter (HOSPITAL_COMMUNITY): Payer: Self-pay

## 2020-08-16 ENCOUNTER — Other Ambulatory Visit: Payer: Self-pay

## 2020-08-16 ENCOUNTER — Ambulatory Visit (HOSPITAL_COMMUNITY)
Admission: RE | Admit: 2020-08-16 | Discharge: 2020-08-16 | Disposition: A | Discharge status: Home / Self Care | Payer: Medicare Other | Attending: Internal Medicine | Admitting: Internal Medicine

## 2020-08-16 DIAGNOSIS — K5730 Diverticulosis of large intestine without perforation or abscess without bleeding: Secondary | ICD-10-CM | POA: Insufficient documentation

## 2020-08-16 DIAGNOSIS — Z87891 Personal history of nicotine dependence: Secondary | ICD-10-CM | POA: Diagnosis not present

## 2020-08-16 DIAGNOSIS — D509 Iron deficiency anemia, unspecified: Secondary | ICD-10-CM | POA: Diagnosis not present

## 2020-08-16 DIAGNOSIS — Z79899 Other long term (current) drug therapy: Secondary | ICD-10-CM | POA: Diagnosis not present

## 2020-08-16 DIAGNOSIS — K5669 Other partial intestinal obstruction: Secondary | ICD-10-CM | POA: Diagnosis not present

## 2020-08-16 DIAGNOSIS — C180 Malignant neoplasm of cecum: Secondary | ICD-10-CM | POA: Insufficient documentation

## 2020-08-16 DIAGNOSIS — D490 Neoplasm of unspecified behavior of digestive system: Secondary | ICD-10-CM | POA: Diagnosis not present

## 2020-08-16 DIAGNOSIS — G4730 Sleep apnea, unspecified: Secondary | ICD-10-CM | POA: Diagnosis not present

## 2020-08-16 DIAGNOSIS — K648 Other hemorrhoids: Secondary | ICD-10-CM | POA: Insufficient documentation

## 2020-08-16 HISTORY — PX: BIOPSY: SHX5522

## 2020-08-16 HISTORY — PX: COLONOSCOPY WITH PROPOFOL: SHX5780

## 2020-08-16 SURGERY — COLONOSCOPY WITH PROPOFOL
Anesthesia: General

## 2020-08-16 MED ORDER — LIDOCAINE HCL (CARDIAC) PF 100 MG/5ML IV SOSY
PREFILLED_SYRINGE | INTRAVENOUS | Status: DC | PRN
  Administered 2020-08-16: 50 mg via INTRAVENOUS

## 2020-08-16 MED ORDER — STERILE WATER FOR IRRIGATION IR SOLN
Status: DC | PRN
  Administered 2020-08-16: 10:00:00 1.5 mL

## 2020-08-16 MED ORDER — LACTATED RINGERS IV SOLN: INTRAVENOUS | Status: DC

## 2020-08-16 MED ORDER — LACTATED RINGERS IV SOLN
INTRAVENOUS | Status: DC | PRN
  Administered 2020-08-16: 1000 mL via INTRAVENOUS

## 2020-08-16 MED ORDER — PROPOFOL 10 MG/ML IV BOLUS
INTRAVENOUS | Status: DC | PRN
  Administered 2020-08-16: 30 mg via INTRAVENOUS
  Administered 2020-08-16: 20 mg via INTRAVENOUS
  Administered 2020-08-16: 30 mg via INTRAVENOUS
  Administered 2020-08-16: 100 mg via INTRAVENOUS

## 2020-08-16 NOTE — Op Note (Signed)
[facility] Patient Name: Jerry Macdonald Procedure Date: 08/16/2020 10:11 AM MRN: [id number] Date of Birth: 1931-09-01 Attending MD: Elon Alas. Abbey Chatters DO CSN: [id number] Age: 86 Admit Type: Outpatient Procedure:                Colonoscopy Indications:              Iron deficiency anemia Providers:                Elon Alas. Abbey Chatters, DO, Charlsie Quest. Theda Sers RN, RN,                            Caprice Kluver, Randa Spike, Technician Referring MD:              Medicines:                See the Anesthesia note for documentation of the                            administered medications Complications:            No immediate complications. Estimated Blood Loss:     Estimated blood loss was minimal. Procedure:                Pre-Anesthesia Assessment:                           - The anesthesia plan was to use monitored                            anesthesia care (MAC).                           After obtaining informed consent, the colonoscope                            was passed under direct vision. Throughout the                            procedure, the patient's blood pressure, pulse, and                            oxygen saturations were monitored continuously. The                            [id number]-HQ190L ([id number]) scope was introduced through                            the anus and advanced to the the cecum, identified                            by appendiceal orifice and ileocecal valve. The                            colonoscopy was technically difficult and complex                            due to inadequate bowel  prep. The patient tolerated                            the procedure well. The quality of the bowel                            preparation was evaluated using the BBPS [facility]                            Bowel Preparation Scale) with scores of: Right                            Colon = 1 (portion of mucosa seen, but other areas                            not well seen due  to staining, residual stool                            and/or opaque liquid), Transverse Colon = 1                            (portion of mucosa seen, but other areas not well                            seen due to staining, residual stool and/or opaque                            liquid) and Left Colon = 2 (minor amount of                            residual staining, small fragments of stool and/or                            opaque liquid, but mucosa seen well). The total                            BBPS score equals 4. The quality of the bowel                            preparation was inadequate. Scope In: 10:21:49 AM Scope Out: 10:40:40 AM Scope Withdrawal Time: 0 hours 8 minutes 56 seconds  Total Procedure Duration: 0 hours 18 minutes 51 seconds  Findings:      The perianal and digital rectal examinations were normal.      Non-bleeding internal hemorrhoids were found during endoscopy.      Many small and large-mouthed diverticula were found in the sigmoid       colon, descending colon and transverse colon.      A large amount of stool was found in the entire colon, precluding       visualization. Lavage of the area was performed using copious amounts,       resulting in incomplete clearance with continued poor visualization.      A fungating, infiltrative, submucosal and ulcerated partially  obstructing large mass was found in the cecum and at the ileocecal       valve. The mass was partially circumferential (involving one-third of       the lumen circumference). No bleeding was present. Mucosa was biopsied       with a cold forceps for histology. Impression:               - Preparation of the colon was inadequate.                           - Non-bleeding internal hemorrhoids.                           - Diverticulosis in the sigmoid colon, in the                            descending colon and in the transverse colon.                           - Stool in the entire examined  colon.                           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 Biopsied. Moderate Sedation:      Per Anesthesia Care Recommendation:           - Patient has a contact number available for                            emergencies. The signs and symptoms of potential                            delayed complications were discussed with the                            patient. Return to normal activities tomorrow.                            Written discharge instructions were provided to the                            patient.                           - Resume previous diet.                           - Continue present medications.                           - Await pathology results.                           - Will order CT chest/abd/pelvis for staging  purposes. WIll refer to surgery for evaluation for                            R hemicolectomy. Will also refer to oncology.                            Patient will likely need repeat colonoscopy with                            extended prep in the near future as I cannot rule                            out other lesions given poor prep today. Procedure Code(s):        --- Professional ---                           [phone], Colonoscopy, flexible; diagnostic, including                            collection of specimen(s) by brushing or washing,                            when performed (separate procedure) Diagnosis Code(s):        --- Professional ---                           K64.8, Other hemorrhoids                           D49.0, Neoplasm of unspecified behavior of                            digestive system                           K56.690, Other partial intestinal obstruction                           D50.9, Iron deficiency anemia, unspecified                           K57.30, Diverticulosis of large intestine without                             perforation or abscess without bleeding CPT copyright 2019 American Medical Association. All rights reserved. The codes documented in this report are preliminary and upon coder review may  be revised to meet current compliance requirements. Elon Alas. Abbey Chatters, DO [place] Abbey Chatters, DO 08/16/2020 10:46:43 AM This report has been signed electronically. Number of Addenda: 0

## 2020-08-16 NOTE — Discharge Instructions (Addendum)
  Colonoscopy Discharge Instructions  Read the instructions outlined below and refer to this sheet in the next few weeks. These discharge instructions provide you with general information on caring for yourself after you leave the hospital. Your doctor may also give you specific instructions. While your treatment has been planned according to the most current medical practices available, unavoidable complications occasionally occur.   ACTIVITY  You may resume your regular activity, but move at a slower pace for the next 24 hours.   Take frequent rest periods for the next 24 hours.   Walking will help get rid of the air and reduce the bloated feeling in your belly (abdomen).   No driving for 24 hours (because of the medicine (anesthesia) used during the test).    Do not sign any important legal documents or operate any machinery for 24 hours (because of the anesthesia used during the test).  NUTRITION  Drink plenty of fluids.   You may resume your normal diet as instructed by your doctor.   Begin with a light meal and progress to your normal diet. Heavy or fried foods are harder to digest and may make you feel sick to your stomach (nauseated).   Avoid alcoholic beverages for 24 hours or as instructed.  MEDICATIONS  You may resume your normal medications unless your doctor tells you otherwise.  WHAT YOU CAN EXPECT TODAY  Some feelings of bloating in the abdomen.   Passage of more gas than usual.   Spotting of blood in your stool or on the toilet paper.  IF YOU HAD POLYPS REMOVED DURING THE COLONOSCOPY:  No aspirin products for 7 days or as instructed.   No alcohol for 7 days or as instructed.   Eat a soft diet for the next 24 hours.  FINDING OUT THE RESULTS OF YOUR TEST Not all test results are available during your visit. If your test results are not back during the visit, make an appointment with your caregiver to find out the results. Do not assume everything is normal if  you have not heard from your caregiver or the medical facility. It is important for you to follow up on all of your test results.  SEEK IMMEDIATE MEDICAL ATTENTION IF:  You have more than a spotting of blood in your stool.   Your belly is swollen (abdominal distention).   You are nauseated or vomiting.   You have a temperature over 101.   You have abdominal pain or discomfort that is severe or gets worse throughout the day.   Your colonoscopy unfortunately showed a mass in the right side of the colon.  I am concerned for colon cancer.  I have sent a rush on the pathology results and we should get those results back by tomorrow or the next day.  We will likely need to perform CT imaging of your chest abdomen pelvis next.  We will need to repeat colonoscopy with extended prep as I cannot rule out other polyps or lesions in your colon based on my exam today.  We will likely need to refer you to surgery and oncology in the near future as well.  I will be in touch in the next 2 days.  I hope you have a great rest of your week!  Elon Alas. Abbey Chatters, D.O. Gastroenterology and Hepatology [facility]

## 2020-08-16 NOTE — Anesthesia Postprocedure Evaluation (Signed)
Anesthesia Post Note  Patient: Jerry Macdonald  Procedure(s) Performed: COLONOSCOPY WITH PROPOFOL (N/A ) BIOPSY  Patient location during evaluation: Phase II Anesthesia Type: General Level of consciousness: awake and alert and oriented Pain management: pain level controlled Vital Signs Assessment: post-procedure vital signs reviewed and stable Respiratory status: spontaneous breathing, nonlabored ventilation and respiratory function stable Cardiovascular status: blood pressure returned to baseline and stable Postop Assessment: no apparent nausea or vomiting Anesthetic complications: no   No complications documented.   Last Vitals:  Vitals:   08/16/20 1045 08/16/20 1108  BP: 110/63 133/68  Pulse: 63 (!) 59  Resp: 16 18  Temp: 36.4 C 36.4 C  SpO2: 100% 98%    Last Pain:  Vitals:   08/16/20 1108  TempSrc: Axillary  PainSc: 0-No pain                 Orlie Dakin

## 2020-08-16 NOTE — H&P (Signed)
Primary Care Physician:  [name], MD Primary Gastroenterologist:  Dr. Abbey Chatters  Pre-Procedure History & Physical: HPI:  Jerry Macdonald is a 86 y.o. male is here for a colonoscopy to be performed for iron deficiency anemia. Last colonoscopy 10 years ago WNL.     Patient denies any family history of colorectal cancer.  No melena or hematochezia.  No abdominal pain or unintentional weight loss.  No change in bowel habits.  Overall feels well from a GI standpoint.  Past Medical History:  Diagnosis Date  . Abnormal prostate biopsy   . Back pain    with radiculopathy  . Cataracts, bilateral   . Constipation   . Hyperlipidemia   . Hypertrophy of prostate   . Malaise and fatigue   . Obesity   . Pyogenic granuloma    left cheeck  . S/P colonoscopy June 2011   rectal bleeding secondary to hemorrhoids  . S/P colonoscopy 2007   [facility], diverticulosis per Dr. Juel Burrow note  . S/P endoscopy June 2011   Barrett's, mild gastritis and duodenitis  . Seborrheic keratosis   . Sleep apnea     Past Surgical History:  Procedure Laterality Date  . BIOPSY  03/27/2019   Procedure: BIOPSY;  Surgeon: Danie Binder, MD;  Location: [facility];  Service: Endoscopy;;  . COLONOSCOPY  JUN 2011   PAN-COLONIC DIVERTICULOSIS  . COLONOSCOPY  2007   [facility] [facility]  . ESOPHAGOGASTRODUODENOSCOPY  06/11/2011   Procedure: ESOPHAGOGASTRODUODENOSCOPY (EGD);  Surgeon: Dorothyann Peng, MD;  Location: [facility];  Service: Endoscopy;  Laterality: N/A;  11:00  . ESOPHAGOGASTRODUODENOSCOPY N/A 05/21/2014   Dr. Fields:Barrett's esophagus/ Medium sized hiatal hernia. path with indefinite dysplasia.   Marland Kitchen ESOPHAGOGASTRODUODENOSCOPY N/A 12/20/2014   Dr. Clayburn Pert gastric polyp/large HH/6 cm segment of Barrett's esophagus, path with hyperplastic gastric polyp, no dysplasia. Surveillance April 2017 due   . ESOPHAGOGASTRODUODENOSCOPY N/A 01/20/2016   Procedure: ESOPHAGOGASTRODUODENOSCOPY (EGD);  Surgeon: Danie Binder,  MD;  Location: [facility];  Service: Endoscopy;  Laterality: N/A;  1230pm  . ESOPHAGOGASTRODUODENOSCOPY N/A 03/27/2019   Procedure: ESOPHAGOGASTRODUODENOSCOPY (EGD);  Surgeon: Danie Binder, MD;  Location: [facility];  Service: Endoscopy;  Laterality: N/A;  12:15pm  . HERNIA REPAIR    . UMBILICAL HERNIA REPAIR    . VARICOSE VEIN SURGERY      Prior to Admission medications   Medication Sig Start Date End Date Taking? Authorizing Provider  CALCIUM PO Take 600 mg by mouth daily. With magnesium   Yes [provider]  finasteride (PROSCAR) 5 MG tablet Take 1 tablet (5 mg total) by mouth daily. 03/11/20  Yes Irine Seal, MD  Multiple Vitamins-Minerals (MULTIVITAMINS THER. W/MINERALS) TABS Take 1 tablet by mouth daily.   Yes [provider]  pantoprazole (PROTONIX) 40 MG tablet TAKE 1 TABLET TWICE A DAY BEFORE MEALS Patient taking differently: Take 40 mg by mouth daily. 08/04/20  Yes Carlis Stable, NP  tamsulosin (FLOMAX) 0.4 MG CAPS capsule Take 1 capsule (0.4 mg total) by mouth daily. 03/11/20  Yes Irine Seal, MD    Allergies as of 07/04/2020 - Review Complete 06/30/2020  Allergen Reaction Noted  . Penicillins  07/07/2007    Family History  Problem Relation Age of Onset  . Colon polyps Neg Hx   . Colon cancer Neg Hx     Social History   Socioeconomic History  . Marital status: Divorced    Spouse name: Not on file  . Number  of children: Not on file  . Years of education: Not on file  . Highest education level: Not on file  Occupational History  . Not on file  Tobacco Use  . Smoking status: Former Smoker    Packs/day: 0.50    Types: Cigarettes  . Smokeless tobacco: Never Used  . Tobacco comment: quit in 1965  Substance and Sexual Activity  . Alcohol use: Yes    Comment: 2-3 oz red wine a day  . Drug use: No  . Sexual activity: Not on file  Other Topics Concern  . Not on file  Social History Narrative  . Not on file   Social Determinants of Health    Financial Resource Strain: Not on file  Food Insecurity: Not on file  Transportation Needs: Not on file  Physical Activity: Not on file  Stress: Not on file  Social Connections: Not on file  Intimate Partner Violence: Not on file    Review of Systems: See HPI, otherwise negative ROS  Impression/Plan: Jerry Macdonald is here for a colonoscopy to be performed for iron deficiency anemia.   The risks of the procedure including infection, bleed, or perforation as well as benefits, limitations, alternatives and imponderables have been reviewed with the patient. Questions have been answered. All parties agreeable.

## 2020-08-16 NOTE — Transfer of Care (Signed)
Immediate Anesthesia Transfer of Care Note  Patient: Jerry Macdonald  Procedure(s) Performed: COLONOSCOPY WITH PROPOFOL (N/A ) BIOPSY  Patient Location: PACU  Anesthesia Type:General  Level of Consciousness: awake, alert  and oriented  Airway & Oxygen Therapy: Patient Spontanous Breathing  Post-op Assessment: Report given to RN and Post -op Vital signs reviewed and stable  Post vital signs: Reviewed and stable  Last Vitals:  Vitals Value Taken Time  BP 110/63 08/16/20 1045  Temp    Pulse 63 08/16/20 1045  Resp 21 08/16/20 1045  SpO2 99 % 08/16/20 1045  Vitals shown include unvalidated device data.  Last Pain:  Vitals:   08/16/20 1019  TempSrc:   PainSc: 0-No pain      Patients Stated Pain Goal: 8 (97/53/00 5110)  Complications: No complications documented.

## 2020-08-16 NOTE — Anesthesia Preprocedure Evaluation (Signed)
Anesthesia Evaluation  Patient identified by MRN, date of birth, ID band Patient awake    Reviewed: Allergy & Precautions, H&P , NPO status , Patient's Chart, lab work & pertinent test results, reviewed documented beta blocker date and time   Airway Mallampati: II  TM Distance: >3 FB Neck ROM: full    Dental no notable dental hx.    Pulmonary sleep apnea , former smoker,    Pulmonary exam normal breath sounds clear to auscultation       Cardiovascular Exercise Tolerance: Good negative cardio ROS   Rhythm:regular Rate:Normal     Neuro/Psych negative neurological ROS  negative psych ROS   GI/Hepatic negative GI ROS, Neg liver ROS,   Endo/Other  negative endocrine ROS  Renal/GU negative Renal ROS  negative genitourinary   Musculoskeletal   Abdominal   Peds  Hematology  (+) Blood dyscrasia, anemia ,   Anesthesia Other Findings   Reproductive/Obstetrics negative OB ROS                             Anesthesia Physical Anesthesia Plan  ASA: III  Anesthesia Plan: General   Post-op Pain Management:    Induction:   PONV Risk Score and Plan: Propofol infusion  Airway Management Planned:   Additional Equipment:   Intra-op Plan:   Post-operative Plan:   Informed Consent: I have reviewed the patients History and Physical, chart, labs and discussed the procedure including the risks, benefits and alternatives for the proposed anesthesia with the patient or authorized representative who has indicated his/her understanding and acceptance.     Dental Advisory Given  Plan Discussed with: CRNA  Anesthesia Plan Comments:         Anesthesia Quick Evaluation

## 2020-08-16 NOTE — Anesthesia Procedure Notes (Signed)
Date/Time: 08/16/2020 10:24 AM Performed by: Orlie Dakin, CRNA Pre-anesthesia Checklist: Patient identified, Emergency Drugs available, Patient being monitored and Suction available Patient Re-evaluated:Patient Re-evaluated prior to induction Oxygen Delivery Method: Nasal cannula Induction Type: IV induction Placement Confirmation: positive ETCO2

## 2020-08-17 ENCOUNTER — Telehealth: Payer: Self-pay | Admitting: Internal Medicine

## 2020-08-17 DIAGNOSIS — C189 Malignant neoplasm of colon, unspecified: Secondary | ICD-10-CM

## 2020-08-17 NOTE — Telephone Encounter (Signed)
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  Can we set patient up for CT chest, abdomen, pelvis for staging purposes.  He will also need referral to general surgery Dr. Prentiss Bells. Jerry Macdonald and oncology.    Of note, patient's point of contact is his friend Jerry Macdonald  [phone]  Thank you

## 2020-08-17 NOTE — Telephone Encounter (Signed)
Routing to [facility] clinical pool 

## 2020-08-18 LAB — SURGICAL PATHOLOGY

## 2020-08-19 NOTE — Telephone Encounter (Signed)
Referral to surgeon and oncology sent via Epic.  CT chest/abd/pelvis w/contrast scheduled for 08/22/20 at 12:00pm, arrive at 11:45am. NPO 4 hours prior to test. Pick up contrast.  Called Kathee Polite, informed of referrals being sent and CT appt.

## 2020-08-19 NOTE — Addendum Note (Signed)
Addended by: Hassan Rowan on: 08/19/2020 09:33 AM   Modules accepted: Orders

## 2020-08-22 ENCOUNTER — Other Ambulatory Visit: Payer: Self-pay

## 2020-08-22 ENCOUNTER — Ambulatory Visit (HOSPITAL_COMMUNITY)
Admission: RE | Admit: 2020-08-22 | Discharge: 2020-08-22 | Disposition: A | Discharge status: Home / Self Care | Payer: Medicare Other | Source: Ambulatory Visit | Attending: Internal Medicine | Admitting: Internal Medicine

## 2020-08-22 DIAGNOSIS — C189 Malignant neoplasm of colon, unspecified: Secondary | ICD-10-CM | POA: Diagnosis not present

## 2020-08-22 DIAGNOSIS — C180 Malignant neoplasm of cecum: Secondary | ICD-10-CM | POA: Diagnosis not present

## 2020-08-22 MED ORDER — IOHEXOL 300 MG/ML  SOLN
100.0000 mL | Freq: Once | INTRAMUSCULAR | Status: AC | PRN
  Administered 2020-08-22: 12:00:00 100 mL via INTRAVENOUS

## 2020-08-23 ENCOUNTER — Encounter (HOSPITAL_COMMUNITY): Payer: Self-pay | Admitting: Internal Medicine

## 2020-08-30 ENCOUNTER — Ambulatory Visit (INDEPENDENT_AMBULATORY_CARE_PROVIDER_SITE_OTHER): Payer: Medicare Other | Admitting: General Surgery

## 2020-08-30 ENCOUNTER — Other Ambulatory Visit: Payer: Self-pay

## 2020-08-30 ENCOUNTER — Encounter: Payer: Self-pay | Admitting: General Surgery

## 2020-08-30 VITALS — BP 116/71 | HR 83 | Temp 98.4°F | Resp 14 | Ht 70.0 in | Wt 209.0 lb

## 2020-08-30 DIAGNOSIS — C180 Malignant neoplasm of cecum: Secondary | ICD-10-CM | POA: Insufficient documentation

## 2020-08-30 DIAGNOSIS — E041 Nontoxic single thyroid nodule: Secondary | ICD-10-CM

## 2020-08-30 DIAGNOSIS — C61 Malignant neoplasm of prostate: Secondary | ICD-10-CM | POA: Diagnosis not present

## 2020-08-30 MED ORDER — NEOMYCIN SULFATE 500 MG PO TABS: 1000.0000 mg | ORAL_TABLET | ORAL | 0 refills | Status: DC

## 2020-08-30 MED ORDER — METRONIDAZOLE 500 MG PO TABS: 500.0000 mg | ORAL_TABLET | ORAL | 0 refills | Status: DC

## 2020-08-30 MED ORDER — GOLYTELY 236 G PO SOLR: 4000.0000 mL | Freq: Once | ORAL | 0 refills | Status: AC

## 2020-08-30 NOTE — Progress Notes (Signed)
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 Abbey Chatters   Chief Complaint    New Patient (Initial Visit)      Jerry Macdonald is a 86 y.o. male.  HPI: Jerry Macdonald is a very sweet 86 yo who lives independently and is here today with his POA, Kathee Polite a long time friend. He has prostate cancer, HLD, and chronic constipation.  He has no other living relatives aside from a 36 year aunt.  He says that he was found to have anemia and was worked up with a colonoscopy. This demonstrated a mass in the cecum that was near obstructing. He is not clinically obstructed. Biopsies show adenocarcinoma.  He says he is otherwise be doing well and has no complaints. He has no cardiac history. He does have some right groin pain on occasion and has a hernia in this region.   He walks everyday 2 miles and has no chest pain or SOB complaints.   Past Medical History:  Diagnosis Date  . Abnormal prostate biopsy   . Back pain    with radiculopathy  . Cataracts, bilateral   . Constipation   . Hyperlipidemia   . Hypertrophy of prostate   . Malaise and fatigue   . Obesity   . Pyogenic granuloma    left cheeck  . S/P colonoscopy June 2011   rectal bleeding secondary to hemorrhoids  . S/P colonoscopy 2007   [facility], diverticulosis per Dr. Juel Burrow note  . S/P endoscopy June 2011   Barrett's, mild gastritis and duodenitis  . Seborrheic keratosis   . Sleep apnea     Past Surgical History:  Procedure Laterality Date  . BIOPSY  03/27/2019   Procedure: BIOPSY;  Surgeon: Danie Binder, MD;  [facility];  Service: Endoscopy;;  . BIOPSY  08/16/2020   Procedure: BIOPSY;  Surgeon: Eloise Harman, DO;  Location: [facility];  Service: Endoscopy;;  . COLONOSCOPY  JUN 2011   PAN-COLONIC DIVERTICULOSIS  . COLONOSCOPY  2007   [place]  . COLONOSCOPY WITH PROPOFOL N/A 08/16/2020   Procedure: COLONOSCOPY WITH PROPOFOL;   Surgeon: Eloise Harman, DO;  [facility]: [facility];  Service: Endoscopy;  Laterality: N/A;  10:45am  . ESOPHAGOGASTRODUODENOSCOPY  06/11/2011   Procedure: ESOPHAGOGASTRODUODENOSCOPY (EGD);  Surgeon: Dorothyann Peng, MD;  [facility];  Service: Endoscopy;  Laterality: N/A;  11:00  . ESOPHAGOGASTRODUODENOSCOPY N/A 05/21/2014   Dr. Fields:Barrett's esophagus/ Medium sized hiatal hernia. path with indefinite dysplasia.   Marland Kitchen ESOPHAGOGASTRODUODENOSCOPY N/A 12/20/2014   Dr. Clayburn Pert gastric polyp/large HH/6 cm segment of Barrett's esophagus, path with hyperplastic gastric polyp, no dysplasia. Surveillance April 2017 due   . ESOPHAGOGASTRODUODENOSCOPY N/A 01/20/2016   Procedure: ESOPHAGOGASTRODUODENOSCOPY (EGD);  Surgeon: Danie Binder, MD;  [facility];  Service: Endoscopy;  Laterality: N/A;  1230pm  . ESOPHAGOGASTRODUODENOSCOPY N/A 03/27/2019   Procedure: ESOPHAGOGASTRODUODENOSCOPY (EGD);  Surgeon: Danie Binder, MD;  [facility];  Service: Endoscopy;  Laterality: N/A;  12:15pm  . HERNIA REPAIR    . UMBILICAL HERNIA REPAIR    . VARICOSE VEIN SURGERY      Family History  Problem Relation Age of Onset  . Colon polyps Neg Hx   . Colon cancer Neg Hx     Social History   Tobacco Use  . Smoking status: Former Smoker    Packs/day: 0.50    Types: Cigarettes  .  Smokeless tobacco: Never Used  . Tobacco comment: quit in 1965  Substance Use Topics  . Alcohol use: Yes    Comment: 2-3 oz red wine a day  . Drug use: No    Medications: I have reviewed the patient's current medications. Allergies as of 08/30/2020      Reactions   Penicillins    Swelling of hands and feet - severe Did it involve swelling of the face/tongue/throat, SOB, or low BP? No Did it involve sudden or severe rash/hives, skin peeling, or any reaction on the inside of your mouth or nose? No Did you need to seek medical attention at a hospital or doctor's office? No When did it last  happen?1950s If all above answers are "NO", may proceed with cephalosporin use.      Medication List       Accurate as of August 30, 2020 11:30 AM. If you have any questions, ask your nurse or doctor.        CALCIUM PO Take 600 mg by mouth daily. With magnesium   finasteride 5 MG tablet Commonly known as: PROSCAR Take 1 tablet (5 mg total) by mouth daily.   multivitamins ther. w/minerals Tabs tablet Take 1 tablet by mouth daily.   pantoprazole 40 MG tablet Commonly known as: PROTONIX TAKE 1 TABLET TWICE A DAY BEFORE MEALS What changed: See the new instructions.   tamsulosin 0.4 MG Caps capsule Commonly known as: FLOMAX Take 1 capsule (0.4 mg total) by mouth daily.        ROS:  A comprehensive review of systems was negative except for: Gastrointestinal: positive for abdominal pain Genitourinary: positive for frequency  Blood pressure 116/71, pulse 83, temperature 98.4 F (36.9 C), temperature source Oral, resp. rate 14, height 5\' 10"  (1.778 m), weight 209 lb (94.8 kg), SpO2 95 %. Physical Exam Vitals reviewed.  Constitutional:      Appearance: Normal appearance.  HENT:     Head: Normocephalic.     Nose: Nose normal.  Eyes:     Extraocular Movements: Extraocular movements intact.  Neck:     Thyroid: Thyromegaly present.  Cardiovascular:     Rate and Rhythm: Normal rate.  Pulmonary:     Effort: Pulmonary effort is normal.  Abdominal:     General: Abdomen is flat. There is no distension.     Tenderness: There is abdominal tenderness.     Hernia: A hernia is present. Hernia is present in the right inguinal area.     Comments: Reducible right inguinal hernia   Musculoskeletal:        General: Normal range of motion.     Cervical back: Normal range of motion.  Skin:    General: Skin is warm.  Neurological:     General: No focal deficit present.     Mental Status: He is alert and oriented to person, place, and time.  Psychiatric:        Mood and  Affect: Mood normal.        Behavior: Behavior normal.        Thought Content: Thought content normal.        Judgment: Judgment normal.     Results: Personally reviewed- thyroid enlarged with nodules, cecal cancer with slightly enlarged lymph nodes  CLINICAL DATA:  Colorectal cancer, staging cecal mass. History of prostate cancer.  EXAM: CT CHEST, ABDOMEN, AND PELVIS WITH CONTRAST  TECHNIQUE: Multidetector CT imaging of the chest, abdomen and pelvis was performed following the standard protocol during  bolus administration of intravenous contrast.  CONTRAST:  OMNIPAQUE IOHEXOL 300 MG/ML  SOLN  COMPARISON:  None.  FINDINGS: CT CHEST FINDINGS  Cardiovascular: Atherosclerotic calcification of the aorta, aortic valve and coronary arteries. Heart is enlarged. No pericardial effusion.  Mediastinum/Nodes: Thyroid is enlarged, heterogeneous and nodular. Largest individual nodule is seen on the right, measuring 6.0 cm. No pathologically enlarged mediastinal, hilar or axillary lymph nodes. Esophagus is grossly unremarkable.  Lungs/Pleura: Calcified pleural plaques bilaterally. Bibasilar scarring. No pleural fluid. Airway is unremarkable.  Musculoskeletal: Degenerative changes in the spine. Old rib fractures. No worrisome lytic or sclerotic lesions.  CT ABDOMEN PELVIS FINDINGS  Hepatobiliary: 1.7 cm low-density lesion in the dome of the left hepatic lobe, too small to characterize. Attenuation characteristics suggest a cyst. Slight irregularity of the liver margin is thought to be due to the adjacent hemidiaphragm. Liver and gallbladder are otherwise unremarkable. No biliary ductal dilatation.  Pancreas: Negative.  Spleen: Negative.  Adrenals/Urinary Tract: Right adrenal gland is unremarkable. Slight nodular thickening of the left adrenal gland. Kidneys are unremarkable. Prominent extrarenal pelves bilaterally. A knuckle of the distal right ureter  extends in to the superior aspect of the right inguinal hernia. Ureters are decompressed. An enlarged prostate indents the bladder.  Stomach/Bowel: Small hiatal hernia. Stomach, small bowel and appendix are unremarkable. There is eccentric wall thickening involving the cecum (2/87). Remainder of the colon is unremarkable.  Vascular/Lymphatic: Atherosclerotic calcification of the aorta without aneurysm. Ileocolic mesenteric lymph nodes measure up to 8 mm (2/89). Other small bowel mesenteric lymph nodes are not enlarged by CT size criteria.  Reproductive: Prostate is enlarged.  Other: Bilateral inguinal hernias contain fat. On the right, as mentioned previously, a knuckle of the distal right ureter is seen along the superior aspect of the right inguinal hernia. No free fluid. Mesenteries and peritoneum are otherwise unremarkable.  Musculoskeletal: Degenerative changes in the spine. No worrisome lytic or sclerotic lesions. Minimal grade 1 anterolisthesis of L4 on L5.  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 No evidence of distant metastatic spread. 2. A knuckle of the distal right ureter extends into the superior aspect of a right inguinal hernia. No hydronephrosis. 3. Enlarged, heterogeneous and nodular thyroid. Recommend thyroid US (ref: J Am Coll Radiol. 2015 Feb;12(2): 143-50). 4. Enlarged prostate. 5. Asbestos related pleural disease. If assessment for interstitial lung disease is desired, high-resolution chest CT without contrast is suggested. 6. Aortic atherosclerosis (ICD10-I70.0). Coronary artery calcification.   Electronically Signed   By: Leanna Battles M.D.   On: 08/22/2020 16:29  Assessment & Plan:  Jerry Macdonald is a 86 y.o. 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 He also has a thyroid that is very nodular and large. He will need and [place] and TSH  ordered. I called and talked to him and his POA/ Harriett Sine about need for this testing and his need PSA in the upcoming weeks.   -Ordered TSH, CBC w/ diff, CMP, TSH, PSA and CEA for preop  -Ordered Thyroid US  -Office will try to get these things scheduled Monday -Hopefully is preop labs can just be done on 09/07/2019 when he comes to see Dr. Ellin Saba   -He had issues with Miralax/ dulcolax prep and asked for Golytely as he his colonoscopy had a significant stool burden  -Told them to do liquid diet for 2 days prior   Golytely bowel prep.  Take 2 neomycin 500mg  tablets and 2  metronidazole 500mg  tablets at 2 pm. Take 2 neomycin 500mg  tablets and 2 metronidazole 500mg  tablets at 3pm. Take 2 neomycin 500mg  tablets and 2 metronidazole 500mg  tablets at 10pm.    Discussed colon cancer and plans for laparoscopic versus open right hemicolectomy. Discussed risk of bleeding, infection, anastomotic leak, finding positive lymph nodes, injury to other organs or the ureter, need for an open procedure. Discussed possible need for blood and risk of infections and allergic reactions. He will receive blood if needed.   All questions were answered to the satisfaction of the patient and POA Kathee Polite.  Virl Cagey 08/30/2020, 11:30 AM

## 2020-08-30 NOTE — Patient Instructions (Signed)
The Day Prior to Surgery: Take your go lytely as instructed.  Drink plenty of clear liquids all day to avoid dehydration, no solid food.    Take 2 neomycin 500mg  tablets and 2 metronidazole 500mg  tablets at 2 pm. Take 2 neomycin 500mg  tablets and 2 metronidazole 500mg  tablets at 3pm. Take 2 neomycin 500mg  tablets and 2 metronidazole 500mg  tablets at 10pm.    Do not eat or drink anything after midnight the night before your surgery.  Do not eat or drink anything that morning, and take medications as instructed by the hospital staff on your preoperative visit.    Colorectal Cancer  Colorectal cancer is an abnormal growth of cells and tissue (tumor) in the colon or rectum, which are parts of the large intestine. The cancer can spread (metastasize) to other parts of the body. What are the causes? Most cases of colorectal cancer start as abnormal growths called polyps on the inner wall of the colon or rectum. Other times, abnormal changes to genes (genetic mutations) can cause cells to form cancer. What increases the risk? You are more likely to develop this condition if:  You are older than age 55.  You have multiple polyps in the colon or rectum.  You have diabetes.  You are African American.  You have a family history of Lynch syndrome.  You have had cancer before.  You have certain hereditary conditions, such as: ? Familial adenomatous polyposis. ? Turcot syndrome. ? Peutz-Jeghers syndrome.  You eat a diet that is high in fat (especially animal fat) and low in fiber, fruits, and vegetables.  You have an inactive (sedentary) lifestyle.  You have an inflammatory bowel disease or Crohn's disease.  You smoke.  You drink alcohol excessively. What are the signs or symptoms? Early colorectal cancer often does not cause symptoms. As the cancer grows, symptoms may include:  Changes in bowel habits.  Feeling like the bowel does not empty completely after a bowel  movement.  Stools that are narrower than usual.  Blood in the stool.  Diarrhea.  Constipation.  Anemia.  Discomfort, pain, bloating, fullness, or cramps in the abdomen.  Frequent gas pain.  Unexplained weight loss.  Constant fatigue.  Nausea and vomiting. How is this diagnosed? This condition may be diagnosed with:  A medical history.  A physical exam.  Tests. These may include: ? A digital rectal exam. ? A stool test called a fecal occult blood test. ? Blood tests. ? A test in which a tissue sample is taken from the colon or rectum and examined under a microscope (biopsy). ? Imaging tests, such as:  X-rays.  A barium enema.  CT scans.  MRIs.  A sigmoidoscopy. This test is done to view the inside of the rectum.  A colonoscopy. This test is done to view the inside of the colon. During this test, small polyps can be removed or biopsies may be taken.  An endorectal ultrasound. This test checks how deep a tumor in the rectum has grown and whether the cancer has spread to lymph nodes or other nearby tissues. Your health care provider may order additional tests to find out whether the cancer has spread to other parts of the body (what stage it is). The stages of cancer include:  Stage 0. At this stage, the cancer is found only in the innermost lining of the colon or rectum.  Stage I. At this stage, the cancer has grown into the inner wall of the colon or rectum.  Stage II. At this stage, the cancer has gone more deeply into the wall of the colon or rectum or through the wall. It may have invaded nearby tissue.  Stage III. At this stage, the cancer has spread to nearby lymph nodes.  Stage IV. At this stage, the cancer has spread to other parts of the body, such as the liver or lungs. How is this treated? Treatment for this condition depends on the type and stage of the cancer. Treatment may include:  Surgery. In the early stages of the cancer, surgery may be  done to remove polyps or small tumors from the colon. In later stages, surgery may be done to remove part of the colon.  Chemotherapy. This treatment uses medicines to kill cancer cells.  Targeted therapy. This treatment targets specific gene mutations or proteins that the cancer expresses in order to kill tumor cells.  Radiation therapy. This treatment uses radiation to kill cancer cells or shrink tumors.  Radiofrequency ablation. This treatment uses radio waves to destroy the tumors that may have spread to other areas of the body, such as the liver. Follow these instructions at home:  Take over-the-counter and prescription medicines only as told by your health care provider.  Try to eat regular, healthy meals. Some of your treatments might affect your appetite. If you are having problems eating or with your appetite, ask to meet with a food and nutrition specialist (dietitian).  Consider joining a support group. This may help you learn about your diagnosis and cope with the stress of having colorectal cancer.  If you are admitted to the hospital, inform your cancer care team.  Keep all follow-up visits as told by your health care provider. This is important. How is this prevented?  Colorectal cancer can be prevented with screening tests that find polyps so they can be removed before they develop into cancer.  All adults should have screening for colorectal cancer starting at age 61 and continuing until age 13. Your health care provider may recommend screening at age 60. People at increased risk should start screening at an earlier age. Where to find more information  American Cancer Society: https://www.cancer.org  [employer] (NCI): https://www.cancer.gov Contact a health care provider if:  Your diarrhea or constipation does not go away.  You have blood in your stool.  Your bowel habits change.  You have increased pain in your abdomen.  You notice new fatigue  or weakness.  You lose weight. Get help right away if:  You have increased bleeding from your rectum.  You have any uncontrollable or severe abdomen (abdominal) symptoms. Summary  Colorectal cancer is an abnormal growth of cells and tissue (tumor) in the colon or rectum.  Common risk factors for this condition include having a relative with colon cancer, being older in age, having an inflammatory bowel disease, and being African American.  This condition may be diagnosed with tests, such as a colonoscopy and biopsy.  Treatment depends on the type and stage of the cancer. Commonly, treatment includes surgery to remove the tumor along with chemotherapy or targeted therapy.  Keep all follow-up visits as told by your health care provider. This is important. This information is not intended to replace advice given to you by your health care provider. Make sure you discuss any questions you have with your health care provider. Document Revised: 10/10/2017 Document Reviewed: 09/21/2016 Elsevier Patient Education  2020 [employer] \ Laparoscopic Colectomy Laparoscopic colectomy is surgery to remove part or  all of the large intestine (colon). This procedure may be used to treat several conditions, including:  Inflammation and infection of the colon (diverticulitis).  Tumors or masses in the colon.  Inflammatory bowel disease, such as Crohn disease or ulcerative colitis. Colectomy is an option when symptoms cannot be controlled with medicines.  Bleeding from the colon that cannot be controlled by another method.  Blockage or obstruction of the colon. Tell a health care provider about:  Any allergies you have.  All medicines you are taking, including vitamins, herbs, eye drops, creams, and over-the-counter medicines.  Any problems you or family members have had with anesthetic medicines.  Any blood disorders you have.  Any surgeries you have had.  Any medical conditions you  have. What are the risks? Generally, this is a safe procedure. However, problems may occur, including:  Infection.  Bleeding.  Allergic reactions to medicines or dyes.  Damage to other structures or organs.  Leaking from where the colon was sewn together.  Future blockage of the small intestines from scar tissue. Another surgery may be needed to repair this.  Needing to convert to an open procedure. Complications such as damage to other organs or excessive bleeding may require the surgeon to convert from a laparoscopic procedure to an open procedure. This involves making a larger incision in the abdomen. Medicines  Ask your health care provider about: ? Changing or stopping your regular medicines. This is especially important if you are taking diabetes medicines or blood thinners. ? Taking medicines such as aspirin and ibuprofen. These medicines can thin your blood. Do not take these medicines before your procedure if your health care provider instructs you not to.  You may be given antibiotic medicine to clean out bacteria from your colon. Follow the directions carefully and take the medicine at the correct time. General instructions  You may be prescribed an oral bowel prep to clean out your colon in preparation for the surgery: ? Follow instructions from your health care provider about how to do this. ? Do not eat or drink anything else after you have started the bowel prep, unless your health care provider tells you it is safe to do so.  Do not use any products that contain nicotine or tobacco, such as cigarettes and e-cigarettes. If you need help quitting, ask your health care provider. What happens during the procedure?  To reduce your risk of infection: ? Your health care team will wash or sanitize their hands. ? Your skin will be washed with soap.  An IV tube will be inserted into one of your veins to deliver fluid and medication.  You will be given one of the  following: ? A medicine to help you relax (sedative). ? A medicine to make you fall asleep (general anesthetic).  Small monitors will be connected to your body. They will be used to check your heart, blood pressure, and oxygen level.  A breathing tube may be placed into your lungs during the procedure.  A thin, flexible tube (catheter) will be placed into your bladder to drain urine.  A tube may be placed through your nose and into your stomach to drain stomach fluids (nasogastric tube, or NG tube).  Your abdomen will be filled with air so it expands. This gives the surgeon more room to operate and makes your organs easier to see.  Several small cuts (incisions) will be made in your abdomen.  A thin, lighted tube with a tiny camera on the end (  laparoscope) will be put through one of the small incisions. The camera on the laparoscope will send a picture to a computer screen in the operating room. This will give the surgeon a good view inside your abdomen.  Hollow tubes will be put through the other small incisions in your abdomen. The tools that are needed for the procedure will be put through these tubes.  Clamps or staples will be put on both ends of the diseased part of the colon.  The part of the intestine between the clamps or staples will be removed.  If possible, the ends of the healthy colon that remain will be stitched (sutured) or stapled together to allow your body to pass waste (stool).  Sometimes, the remaining colon cannot be stitched back together. If this is the case, a colostomy will be needed. If you need a colostomy: ? An opening to the outside of your body (stoma) will be made through your abdomen. ? The end of your colon will be brought to the opening. It will be stitched to the skin. ? A bag will be attached to the opening. Stool will drain into this removable bag. ? The colostomy may be temporary or permanent.  The incisions from the colectomy will be closed with  sutures or staples. The procedure may vary among health care providers and hospitals. What happens after the procedure?  Your blood pressure, heart rate, breathing rate, and blood oxygen level will be monitored until the medicines you were given have worn off.  You will receive fluids through an IV tube until your bowels start to work properly.  Once your bowels are working again, you will be given clear liquids first and then solid food as tolerated.  You will be given medicines to control your pain and nausea, if needed.  Do not drive for 24 hours if you were given a sedative. This information is not intended to replace advice given to you by your health care provider. Make sure you discuss any questions you have with your health care provider. Document Revised: 08/02/2017 Document Reviewed: 05/21/2016 Elsevier Patient Education  2020 [employer].

## 2020-09-02 NOTE — H&P (Signed)
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 Abbey Chatters   Chief Complaint    New Patient (Initial Visit)      Jerry Macdonald is a 86 y.o. male.  HPI: Jerry Macdonald is a very sweet 86 yo who lives independently and is here today with his POA, Jerry Macdonald a long time friend. He has prostate cancer, HLD, and chronic constipation.  He has no other living relatives aside from a 16 year aunt.  He says that he was found to have anemia and was worked up with a colonoscopy. This demonstrated a mass in the cecum that was near obstructing. He is not clinically obstructed. Biopsies show adenocarcinoma.  He says he is otherwise be doing well and has no complaints. He has no cardiac history. He does have some right groin pain on occasion and has a hernia in this region.   He walks everyday 2 miles and has no chest pain or SOB complaints.   Past Medical History:  Diagnosis Date  . Abnormal prostate biopsy   . Back pain    with radiculopathy  . Cataracts, bilateral   . Constipation   . Hyperlipidemia   . Hypertrophy of prostate   . Malaise and fatigue   . Obesity   . Pyogenic granuloma    left cheeck  . S/P colonoscopy June 2011   rectal bleeding secondary to hemorrhoids  . S/P colonoscopy 2007   [facility], diverticulosis per Dr. Juel Burrow note  . S/P endoscopy June 2011   Barrett's, mild gastritis and duodenitis  . Seborrheic keratosis   . Sleep apnea     Past Surgical History:  Procedure Laterality Date  . BIOPSY  03/27/2019   Procedure: BIOPSY;  Surgeon: Danie Binder, MD;  [facility];  Service: Endoscopy;;  . BIOPSY  08/16/2020   Procedure: BIOPSY;  Surgeon: Eloise Harman, DO;  [facility]: [facility];  Service: Endoscopy;;  . COLONOSCOPY  JUN 2011   PAN-COLONIC DIVERTICULOSIS  . COLONOSCOPY  2007   [place] [facility]  . COLONOSCOPY WITH PROPOFOL N/A 08/16/2020   Procedure: COLONOSCOPY WITH PROPOFOL;   Surgeon: Eloise Harman, DO;  [facility];  Service: Endoscopy;  Laterality: N/A;  10:45am  . ESOPHAGOGASTRODUODENOSCOPY  06/11/2011   Procedure: ESOPHAGOGASTRODUODENOSCOPY (EGD);  Surgeon: Dorothyann Peng, MD;  [facility];  Service: Endoscopy;  Laterality: N/A;  11:00  . ESOPHAGOGASTRODUODENOSCOPY N/A 05/21/2014   Dr. Fields:Barrett's esophagus/ Medium sized hiatal hernia. path with indefinite dysplasia.   Marland Kitchen ESOPHAGOGASTRODUODENOSCOPY N/A 12/20/2014   Dr. Clayburn Pert gastric polyp/large HH/6 cm segment of Barrett's esophagus, path with hyperplastic gastric polyp, no dysplasia. Surveillance April 2017 due   . ESOPHAGOGASTRODUODENOSCOPY N/A 01/20/2016   Procedure: ESOPHAGOGASTRODUODENOSCOPY (EGD);  Surgeon: Danie Binder, MD;  [facility];  Service: Endoscopy;  Laterality: N/A;  1230pm  . ESOPHAGOGASTRODUODENOSCOPY N/A 03/27/2019   Procedure: ESOPHAGOGASTRODUODENOSCOPY (EGD);  Surgeon: Danie Binder, MD;  [facility];  Service: Endoscopy;  Laterality: N/A;  12:15pm  . HERNIA REPAIR    . UMBILICAL HERNIA REPAIR    . VARICOSE VEIN SURGERY      Family History  Problem Relation Age of Onset  . Colon polyps Neg Hx   . Colon cancer Neg Hx     Social History   Tobacco Use  . Smoking status: Former Smoker    Packs/day: 0.50    Types: Cigarettes  .  Smokeless tobacco: Never Used  . Tobacco comment: quit in 1965  Substance Use Topics  . Alcohol use: Yes    Comment: 2-3 oz red wine a day  . Drug use: No    Medications: I have reviewed the patient's current medications. Allergies as of 08/30/2020      Reactions   Penicillins    Swelling of hands and feet - severe Did it involve swelling of the face/tongue/throat, SOB, or low BP? No Did it involve sudden or severe rash/hives, skin peeling, or any reaction on the inside of your mouth or nose? No Did you need to seek medical attention at a hospital or doctor's office? No When did it last  happen?1950s If all above answers are "NO", may proceed with cephalosporin use.      Medication List       Accurate as of August 30, 2020 11:30 AM. If you have any questions, ask your nurse or doctor.        CALCIUM PO Take 600 mg by mouth daily. With magnesium   finasteride 5 MG tablet Commonly known as: PROSCAR Take 1 tablet (5 mg total) by mouth daily.   multivitamins ther. w/minerals Tabs tablet Take 1 tablet by mouth daily.   pantoprazole 40 MG tablet Commonly known as: PROTONIX TAKE 1 TABLET TWICE A DAY BEFORE MEALS What changed: See the new instructions.   tamsulosin 0.4 MG Caps capsule Commonly known as: FLOMAX Take 1 capsule (0.4 mg total) by mouth daily.        ROS:  A comprehensive review of systems was negative except for: Gastrointestinal: positive for abdominal pain Genitourinary: positive for frequency  Blood pressure 116/71, pulse 83, temperature 98.4 F (36.9 C), temperature source Oral, resp. rate 14, height 5\' 10"  (1.778 m), weight 209 lb (94.8 kg), SpO2 95 %. Physical Exam Vitals reviewed.  Constitutional:      Appearance: Normal appearance.  HENT:     Head: Normocephalic.     Nose: Nose normal.  Eyes:     Extraocular Movements: Extraocular movements intact.  Neck:     Thyroid: Thyromegaly present.  Cardiovascular:     Rate and Rhythm: Normal rate.  Pulmonary:     Effort: Pulmonary effort is normal.  Abdominal:     General: Abdomen is flat. There is no distension.     Tenderness: There is abdominal tenderness.     Hernia: A hernia is present. Hernia is present in the right inguinal area.     Comments: Reducible right inguinal hernia   Musculoskeletal:        General: Normal range of motion.     Cervical back: Normal range of motion.  Skin:    General: Skin is warm.  Neurological:     General: No focal deficit present.     Mental Status: He is alert and oriented to person, place, and time.  Psychiatric:        Mood and  Affect: Mood normal.        Behavior: Behavior normal.        Thought Content: Thought content normal.        Judgment: Judgment normal.     Results: Personally reviewed- thyroid enlarged with nodules, cecal cancer with slightly enlarged lymph nodes  CLINICAL DATA:  Colorectal cancer, staging cecal mass. History of prostate cancer.  EXAM: CT CHEST, ABDOMEN, AND PELVIS WITH CONTRAST  TECHNIQUE: Multidetector CT imaging of the chest, abdomen and pelvis was performed following the standard protocol during  bolus administration of intravenous contrast.  CONTRAST:  OMNIPAQUE IOHEXOL 300 MG/ML  SOLN  COMPARISON:  None.  FINDINGS: CT CHEST FINDINGS  Cardiovascular: Atherosclerotic calcification of the aorta, aortic valve and coronary arteries. Heart is enlarged. No pericardial effusion.  Mediastinum/Nodes: Thyroid is enlarged, heterogeneous and nodular. Largest individual nodule is seen on the right, measuring 6.0 cm. No pathologically enlarged mediastinal, hilar or axillary lymph nodes. Esophagus is grossly unremarkable.  Lungs/Pleura: Calcified pleural plaques bilaterally. Bibasilar scarring. No pleural fluid. Airway is unremarkable.  Musculoskeletal: Degenerative changes in the spine. Old rib fractures. No worrisome lytic or sclerotic lesions.  CT ABDOMEN PELVIS FINDINGS  Hepatobiliary: 1.7 cm low-density lesion in the dome of the left hepatic lobe, too small to characterize. Attenuation characteristics suggest a cyst. Slight irregularity of the liver margin is thought to be due to the adjacent hemidiaphragm. Liver and gallbladder are otherwise unremarkable. No biliary ductal dilatation.  Pancreas: Negative.  Spleen: Negative.  Adrenals/Urinary Tract: Right adrenal gland is unremarkable. Slight nodular thickening of the left adrenal gland. Kidneys are unremarkable. Prominent extrarenal pelves bilaterally. A knuckle of the distal right ureter  extends in to the superior aspect of the right inguinal hernia. Ureters are decompressed. An enlarged prostate indents the bladder.  Stomach/Bowel: Small hiatal hernia. Stomach, small bowel and appendix are unremarkable. There is eccentric wall thickening involving the cecum (2/87). Remainder of the colon is unremarkable.  Vascular/Lymphatic: Atherosclerotic calcification of the aorta without aneurysm. Ileocolic mesenteric lymph nodes measure up to 8 mm (2/89). Other small bowel mesenteric lymph nodes are not enlarged by CT size criteria.  Reproductive: Prostate is enlarged.  Other: Bilateral inguinal hernias contain fat. On the right, as mentioned previously, a knuckle of the distal right ureter is seen along the superior aspect of the right inguinal hernia. No free fluid. Mesenteries and peritoneum are otherwise unremarkable.  Musculoskeletal: Degenerative changes in the spine. No worrisome lytic or sclerotic lesions. Minimal grade 1 anterolisthesis of L4 on L5.  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 No evidence of distant metastatic spread. 2. A knuckle of the distal right ureter extends into the superior aspect of a right inguinal hernia. No hydronephrosis. 3. Enlarged, heterogeneous and nodular thyroid. Recommend thyroid US (ref: J Am Coll Radiol. 2015 Feb;12(2): 143-50). 4. Enlarged prostate. 5. Asbestos related pleural disease. If assessment for interstitial lung disease is desired, high-resolution chest CT without contrast is suggested. 6. Aortic atherosclerosis (ICD10-I70.0). Coronary artery calcification.   Electronically Signed   By: Leanna Battles M.D.   On: 08/22/2020 16:29  Assessment & Plan:  Jerry Macdonald is a 86 y.o. 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 He also has a thyroid that is very nodular and large. He will need and [place] and TSH  ordered. I called and talked to him and his POA/ Jerry Macdonald about need for this testing and his need PSA in the upcoming weeks.   -Ordered TSH, CBC w/ diff, CMP, TSH, PSA and CEA for preop  -Ordered Thyroid US  -Office will try to get these things scheduled Monday -Hopefully is preop labs can just be done on 09/07/2019 when he comes to see Dr. Ellin Saba   -He had issues with Miralax/ dulcolax prep and asked for Golytely as he his colonoscopy had a significant stool burden  -Told them to do liquid diet for 2 days prior   Golytely bowel prep.  Take 2 neomycin 500mg  tablets and 2  metronidazole 500mg  tablets at 2 pm. Take 2 neomycin 500mg  tablets and 2 metronidazole 500mg  tablets at 3pm. Take 2 neomycin 500mg  tablets and 2 metronidazole 500mg  tablets at 10pm.    Discussed colon cancer and plans for laparoscopic versus open right hemicolectomy. Discussed risk of bleeding, infection, anastomotic leak, finding positive lymph nodes, injury to other organs or the ureter, need for an open procedure. Discussed possible need for blood and risk of infections and allergic reactions. He will receive blood if needed.   All questions were answered to the satisfaction of the patient and POA Jerry Macdonald.  Virl Cagey 08/30/2020, 11:30 AM

## 2020-09-05 ENCOUNTER — Other Ambulatory Visit: Payer: Self-pay | Admitting: Family Medicine

## 2020-09-05 DIAGNOSIS — R7989 Other specified abnormal findings of blood chemistry: Secondary | ICD-10-CM

## 2020-09-05 DIAGNOSIS — E041 Nontoxic single thyroid nodule: Secondary | ICD-10-CM

## 2020-09-05 DIAGNOSIS — E0590 Thyrotoxicosis, unspecified without thyrotoxic crisis or storm: Secondary | ICD-10-CM

## 2020-09-05 DIAGNOSIS — C180 Malignant neoplasm of cecum: Secondary | ICD-10-CM

## 2020-09-05 DIAGNOSIS — C61 Malignant neoplasm of prostate: Secondary | ICD-10-CM

## 2020-09-06 ENCOUNTER — Inpatient Hospital Stay (HOSPITAL_COMMUNITY): Payer: Medicare Other | Attending: Hematology | Admitting: Hematology

## 2020-09-06 ENCOUNTER — Inpatient Hospital Stay (HOSPITAL_COMMUNITY): Payer: Medicare Other

## 2020-09-06 ENCOUNTER — Other Ambulatory Visit: Payer: Self-pay

## 2020-09-06 VITALS — BP 127/63 | HR 62 | Temp 97.3°F | Resp 18 | Ht 70.5 in | Wt 199.4 lb

## 2020-09-06 DIAGNOSIS — E785 Hyperlipidemia, unspecified: Secondary | ICD-10-CM | POA: Diagnosis not present

## 2020-09-06 DIAGNOSIS — G4730 Sleep apnea, unspecified: Secondary | ICD-10-CM | POA: Diagnosis not present

## 2020-09-06 DIAGNOSIS — Z9989 Dependence on other enabling machines and devices: Secondary | ICD-10-CM | POA: Insufficient documentation

## 2020-09-06 DIAGNOSIS — Z87891 Personal history of nicotine dependence: Secondary | ICD-10-CM | POA: Insufficient documentation

## 2020-09-06 DIAGNOSIS — D649 Anemia, unspecified: Secondary | ICD-10-CM | POA: Insufficient documentation

## 2020-09-06 DIAGNOSIS — Z79899 Other long term (current) drug therapy: Secondary | ICD-10-CM | POA: Insufficient documentation

## 2020-09-06 DIAGNOSIS — Z8546 Personal history of malignant neoplasm of prostate: Secondary | ICD-10-CM | POA: Diagnosis not present

## 2020-09-06 DIAGNOSIS — C180 Malignant neoplasm of cecum: Secondary | ICD-10-CM | POA: Insufficient documentation

## 2020-09-06 LAB — CBC WITH DIFFERENTIAL/PLATELET
Abs Immature Granulocytes: 0.01 10*3/uL (ref 0.00–0.07)
Basophils Absolute: 0 10*3/uL (ref 0.0–0.1)
Basophils Relative: 0 %
Eosinophils Absolute: 0.1 10*3/uL (ref 0.0–0.5)
Eosinophils Relative: 2 %
HCT: 35.9 % — ABNORMAL LOW (ref 39.0–52.0)
Hemoglobin: 10.9 g/dL — ABNORMAL LOW (ref 13.0–17.0)
Immature Granulocytes: 0 %
Lymphocytes Relative: 22 %
Lymphs Abs: 1.5 10*3/uL (ref 0.7–4.0)
MCH: 28.7 pg (ref 26.0–34.0)
MCHC: 30.4 g/dL (ref 30.0–36.0)
MCV: 94.5 fL (ref 80.0–100.0)
Monocytes Absolute: 0.9 10*3/uL (ref 0.1–1.0)
Monocytes Relative: 14 %
Neutro Abs: 4.2 10*3/uL (ref 1.7–7.7)
Neutrophils Relative %: 62 %
Platelets: 232 10*3/uL (ref 150–400)
RBC: 3.8 MIL/uL — ABNORMAL LOW (ref 4.22–5.81)
RDW: 15.8 % — ABNORMAL HIGH (ref 11.5–15.5)
WBC: 6.7 10*3/uL (ref 4.0–10.5)
nRBC: 0 % (ref 0.0–0.2)

## 2020-09-06 LAB — COMPREHENSIVE METABOLIC PANEL
ALT: 13 U/L (ref 0–44)
AST: 18 U/L (ref 15–41)
Albumin: 3.7 g/dL (ref 3.5–5.0)
Alkaline Phosphatase: 48 U/L (ref 38–126)
Anion gap: 7 (ref 5–15)
BUN: 19 mg/dL (ref 8–23)
CO2: 25 mmol/L (ref 22–32)
Calcium: 8.8 mg/dL — ABNORMAL LOW (ref 8.9–10.3)
Chloride: 108 mmol/L (ref 98–111)
Creatinine, Ser: 0.7 mg/dL (ref 0.61–1.24)
GFR, Estimated: >60 mL/min (ref >60)
Glucose, Bld: 109 mg/dL — ABNORMAL HIGH (ref 70–99)
Potassium: 4.6 mmol/L (ref 3.5–5.1)
Sodium: 140 mmol/L (ref 135–145)
Total Bilirubin: 0.8 mg/dL (ref 0.3–1.2)
Total Protein: 6.7 g/dL (ref 6.5–8.1)

## 2020-09-06 LAB — IRON AND TIBC
Iron: 15 ug/dL — ABNORMAL LOW (ref 45–182)
Saturation Ratios: 4 % — ABNORMAL LOW (ref 17.9–39.5)
TIBC: 343 ug/dL (ref 250–450)
UIBC: 328 ug/dL

## 2020-09-06 LAB — FERRITIN: Ferritin: 8 ng/mL — ABNORMAL LOW (ref 24–336)

## 2020-09-06 LAB — TSH: TSH: 0.075 u[IU]/mL — ABNORMAL LOW (ref 0.350–4.500)

## 2020-09-06 NOTE — Patient Instructions (Signed)
[facility] at [facility] Discharge Instructions  You were seen today by Dr. Ellin Saba. He went over your recent results. You will have labs done today for further analysis. You will be referred to a geneticist for further genetic analysis. You may proceed with your surgery with Dr. Henreitta Leber. Dr. Ellin Saba will see you 4 weeks after your surgery for labs and follow up.   Thank you for choosing  Cancer Center at [facility] to provide your oncology and hematology care.  To afford each patient quality time with our provider, please arrive at least 15 minutes before your scheduled appointment time.   If you have a lab appointment with the Cancer Center please come in thru the Main Entrance and check in at the main information desk  You need to re-schedule your appointment should you arrive 10 or more minutes late.  We strive to give you quality time with our providers, and arriving late affects you and other patients whose appointments are after yours.  Also, if you no show three or more times for appointments you may be dismissed from the clinic at the providers discretion.     Again, thank you for choosing [facility].  Our hope is that these requests will decrease the amount of time that you wait before being seen by our physicians.       _____________________________________________________________  Should you have questions after your visit to [facility], please contact our office at [phone] between the hours of 8:00 a.m. and 4:30 p.m.  Voicemails left after 4:00 p.m. will not be returned until the following business day.  For prescription refill requests, have your pharmacy contact our office and allow 72 hours.    Cancer Center Support Programs:   > Cancer Support Group  2nd Tuesday of the month 1pm-2pm, Journey Room

## 2020-09-06 NOTE — Progress Notes (Signed)
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 Hurshel Keys. He is scheduled for a laparoscopic right hemicolectomy with Dr. Constance Haw on 09/12/2020.  Today he is accompanied by his wife and he reports feeling okay. His energy levels have been down which he attributes to his thyroid issues. He has a history of prostate cancer. He is currently using CPAP at night which he uses for sleep apnea. He denies having hematochezia, though he reported having melena when he was taking iron tablets. He denies ever having MI's or CVA's and he walks 2 miles Monday through Friday.  His father, both GF and 2 paternal uncles had prostate cancer. 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 He denies having any chemical, though he reports that the [employer] used asbestos for insulation of the pipes.   MEDICAL HISTORY:  Past Medical History:  Diagnosis Date  . Abnormal prostate biopsy   . Back pain    with radiculopathy  . Cataracts, bilateral   . Constipation   . Hyperlipidemia   . Hypertrophy of prostate   . Malaise and fatigue   . Obesity   . Pyogenic granuloma    left cheeck  . S/P colonoscopy June 2011   rectal bleeding secondary to hemorrhoids  . S/P colonoscopy 2007   [facility], diverticulosis per Dr. Juel Burrow note  . S/P endoscopy June 2011   Barrett's, mild gastritis and duodenitis  . Seborrheic keratosis   . Sleep apnea     SURGICAL HISTORY: Past Surgical History:  Procedure Laterality Date  .  BIOPSY  03/27/2019   Procedure: BIOPSY;  Surgeon: Danie Binder, MD;  [facility];  Service: Endoscopy;;  . BIOPSY  08/16/2020   Procedure: BIOPSY;  Surgeon: Eloise Harman, DO;  [facility];  Service: Endoscopy;;  . COLONOSCOPY  JUN 2011   PAN-COLONIC DIVERTICULOSIS  . COLONOSCOPY  2007   [facility]  . COLONOSCOPY WITH PROPOFOL N/A 08/16/2020   Procedure: COLONOSCOPY WITH PROPOFOL;  Surgeon: Eloise Harman, DO;  [facility];  Service: Endoscopy;  Laterality: N/A;  10:45am  . ESOPHAGOGASTRODUODENOSCOPY  06/11/2011   Procedure: ESOPHAGOGASTRODUODENOSCOPY (EGD);  Surgeon: Dorothyann Peng, MD;  [facility];  Service: Endoscopy;  Laterality: N/A;  11:00  . ESOPHAGOGASTRODUODENOSCOPY N/A 05/21/2014   Dr. Fields:Barrett's esophagus/ Medium sized hiatal hernia. path with indefinite dysplasia.   Marland Kitchen ESOPHAGOGASTRODUODENOSCOPY N/A 12/20/2014   Dr. Clayburn Pert gastric polyp/large HH/6 cm segment of Barrett's esophagus, path with hyperplastic gastric polyp, no dysplasia. Surveillance April 2017 due   . ESOPHAGOGASTRODUODENOSCOPY N/A 01/20/2016   Procedure: ESOPHAGOGASTRODUODENOSCOPY (EGD);  Surgeon: Danie Binder, MD;  [facility];  Service: Endoscopy;  Laterality: N/A;  1230pm  . ESOPHAGOGASTRODUODENOSCOPY N/A 03/27/2019   Procedure: ESOPHAGOGASTRODUODENOSCOPY (EGD);  Surgeon: Danie Binder, MD;  [facility];  Service: Endoscopy;  Laterality: N/A;  12:15pm  . HERNIA REPAIR    . Comfort  HISTORY: Social History   Socioeconomic History  . Marital status: Divorced    Spouse name: Not on file  . Number of children: Not on file  . Years of education: Not on file  . Highest education level: Not on file  Occupational History  . Not on file  Tobacco Use  . Smoking status: Former Smoker    Packs/day: 0.50    Types: Cigarettes  . Smokeless tobacco: Never Used  . Tobacco  comment: quit in 1965  Substance and Sexual Activity  . Alcohol use: Yes    Comment: 2-3 oz red wine a day  . Drug use: No  . Sexual activity: Not on file  Other Topics Concern  . Not on file  Social History Narrative  . Not on file   Social Determinants of Health   Financial Resource Strain: Not on file  Food Insecurity: Not on file  Transportation Needs: No Transportation Needs  . Lack of Transportation (Medical): No  . Lack of Transportation (Non-Medical): No  Physical Activity: Inactive  . Days of Exercise per Week: 0 days  . Minutes of Exercise per Session: 0 min  Stress: Not on file  Social Connections: Not on file  Intimate Partner Violence: Not At Risk  . Fear of Current or Ex-Partner: No  . Emotionally Abused: No  . Physically Abused: No  . Sexually Abused: No    FAMILY HISTORY: Family History  Problem Relation Age of Onset  . Colon polyps Neg Hx   . Colon cancer Neg Hx     ALLERGIES:  is allergic to penicillins.  MEDICATIONS:  Current Outpatient Medications  Medication Sig Dispense Refill  . acetaminophen (TYLENOL) 500 MG tablet Take 500 mg by mouth every 8 (eight) hours as needed for moderate pain.    Marland Kitchen CALCIUM PO Take 600 mg by mouth daily. With magnesium    . finasteride (PROSCAR) 5 MG tablet Take 1 tablet (5 mg total) by mouth daily. 90 tablet 3  . GAVILYTE-G 236 g solution SMARTSIG:Milliliter(s) By Mouth    . magnesium oxide (MAG-OX) 400 MG tablet Take 400 mg by mouth 2 (two) times daily.    . metroNIDAZOLE (FLAGYL) 500 MG tablet Take 1 tablet (500 mg total) by mouth as directed. The day before surgery take 2 flagyl 523m tablets at 2 pm, 3pm,10pm 6 tablet 0  . Multiple Vitamins-Minerals (MULTIVITAMINS THER. W/MINERALS) TABS Take 1 tablet by mouth daily.    .Marland Kitchenneomycin (MYCIFRADIN) 500 MG tablet Take 2 tablets (1,000 mg total) by mouth as directed for 1 dose. The day before surgery take 2 neomycin 5022mtablets at 2 pm, 3pm,10pm 6 tablet 0  .  pantoprazole (PROTONIX) 40 MG tablet TAKE 1 TABLET TWICE A DAY BEFORE MEALS (Patient taking differently: Take 40 mg by mouth daily.) 180 tablet 3  . tamsulosin (FLOMAX) 0.4 MG CAPS capsule Take 1 capsule (0.4 mg total) by mouth daily. 90 capsule 3   No current facility-administered medications for this visit.    REVIEW OF SYSTEMS:   Review of Systems  Constitutional: Positive for fatigue (25%). Negative for appetite change.  Respiratory:       Sleep apnea on CPAP  Gastrointestinal: Positive for abdominal pain (4/10 pain) and constipation. Negative for blood in stool.  Psychiatric/Behavioral: Positive for sleep disturbance.     PHYSICAL EXAMINATION: ECOG PERFORMANCE STATUS: 0 - Asymptomatic  Vitals:   09/06/20 1335  BP: 127/63  Pulse: 62  Resp: 18  Temp: (!) 97.3 F (36.3  C)  SpO2: 99%   Filed Weights   09/06/20 1335  Weight: 199 lb 6.4 oz (90.4 kg)   Physical Exam Vitals reviewed.  Constitutional:      Appearance: Normal appearance.  Neck:     Thyroid: No thyroid mass, thyromegaly or thyroid tenderness.  Cardiovascular:     Rate and Rhythm: Normal rate and regular rhythm.     Pulses: Normal pulses.     Heart sounds: Normal heart sounds.  Pulmonary:     Effort: Pulmonary effort is normal.     Breath sounds: Normal breath sounds.  Abdominal:     Palpations: Abdomen is soft. There is no hepatomegaly, splenomegaly or mass.     Tenderness: There is no abdominal tenderness.     Hernia: No hernia is present.  Musculoskeletal:     Right lower leg: No edema.     Left lower leg: No edema.  Lymphadenopathy:     Cervical: No cervical adenopathy.     Lower Body: No right inguinal adenopathy. No left inguinal adenopathy.  Neurological:     General: No focal deficit present.     Mental Status: He is alert and oriented to person, place, and time.  Psychiatric:        Mood and Affect: Mood normal.        Behavior: Behavior normal.      LABORATORY DATA:   I have  reviewed the data as listed CBC Latest Ref Rng & Units 08/12/2020 06/18/2011 05/25/2010  WBC 4.0 - 10.5 K/uL 5.5 6.3 7.3  Hemoglobin 13.0 - 17.0 g/dL 11.2(L) 12.9(L) 14.8  Hematocrit 39.0 - 52.0 % 36.2(L) 39.1 43.8  Platelets 150 - 400 K/uL 225 208 184   CMP Latest Ref Rng & Units 08/12/2020 02/03/2010 08/12/2008  Glucose 70 - 99 mg/dL 101(H) 98 105(H)  BUN 8 - 23 mg/dL 20 24(H) 19  Creatinine 0.61 - 1.24 mg/dL 0.78 0.84 0.81  Sodium 135 - 145 mmol/L 137 135 139  Potassium 3.5 - 5.1 mmol/L 4.0 4.3 4.6  Chloride 98 - 111 mmol/L 103 102 108  CO2 22 - 32 mmol/L _0 Calcium 8.9 - 10.3 mg/dL 8.6(L) 8.9 8.6  Total Protein 6.0 - 8.3 g/dL - 6.1 6.6  Total Bilirubin 0.3 - 1.2 mg/dL - 1.4(H) 1.2  Alkaline Phos 39 - 117 U/L - 45 54  AST 0 - 37 U/L - 22 21  ALT 0 - 53 U/L - 22 16   Surgical pathology ([id number]) on 08/16/2020: Cecal mass biopsy: adenocarcinoma.  RADIOGRAPHIC STUDIES: I have personally reviewed the radiological images as listed and agreed with the findings in the report. CT CHEST ABDOMEN PELVIS W CONTRAST  Result Date: 08/22/2020 CLINICAL DATA:  Colorectal cancer, staging cecal mass. History of prostate cancer. EXAM: CT CHEST, ABDOMEN, AND PELVIS WITH CONTRAST TECHNIQUE: Multidetector CT imaging of the chest, abdomen and pelvis was performed following the standard protocol during bolus administration of intravenous contrast. CONTRAST:  173m OMNIPAQUE IOHEXOL 300 MG/ML  SOLN COMPARISON:  None. FINDINGS: CT CHEST FINDINGS Cardiovascular: Atherosclerotic calcification of the aorta, aortic valve and coronary arteries. Heart is enlarged. No pericardial effusion. Mediastinum/Nodes: Thyroid is enlarged, heterogeneous and nodular. Largest individual nodule is seen on the right, measuring 6.0 cm. No pathologically enlarged mediastinal, hilar or axillary lymph nodes. Esophagus is grossly unremarkable. Lungs/Pleura: Calcified pleural plaques bilaterally. Bibasilar scarring. No pleural  fluid. Airway is unremarkable. Musculoskeletal: Degenerative changes in the spine. Old rib fractures. No worrisome lytic or sclerotic lesions.  CT ABDOMEN PELVIS FINDINGS Hepatobiliary: 1.7 cm low-density lesion in the dome of the left hepatic lobe, too small to characterize. Attenuation characteristics suggest a cyst. Slight irregularity of the liver margin is thought to be due to the adjacent hemidiaphragm. Liver and gallbladder are otherwise unremarkable. No biliary ductal dilatation. Pancreas: Negative. Spleen: Negative. Adrenals/Urinary Tract: Right adrenal gland is unremarkable. Slight nodular thickening of the left adrenal gland. Kidneys are unremarkable. Prominent extrarenal pelves bilaterally. A knuckle of the distal right ureter extends in to the superior aspect of the right inguinal hernia. Ureters are decompressed. An enlarged prostate indents the bladder. Stomach/Bowel: Small hiatal hernia. Stomach, small bowel and appendix are unremarkable. There is eccentric wall thickening involving the cecum (2/87). Remainder of the colon is unremarkable. Vascular/Lymphatic: Atherosclerotic calcification of the aorta without aneurysm. Ileocolic mesenteric lymph nodes measure up to 8 mm (2/89). Other small bowel mesenteric lymph nodes are not enlarged by CT size criteria. Reproductive: Prostate is enlarged. Other: Bilateral inguinal hernias contain fat. On the right, as mentioned previously, a knuckle of the distal right ureter is seen along the superior aspect of the right inguinal hernia. No free fluid. Mesenteries and peritoneum are otherwise unremarkable. Musculoskeletal: Degenerative changes in the spine. No worrisome lytic or sclerotic lesions. Minimal grade 1 anterolisthesis of L4 on L5. 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 No evidence of distant metastatic spread. 2. A knuckle of the distal right ureter extends into the superior  aspect of a right inguinal hernia. No hydronephrosis. 3. Enlarged, heterogeneous and nodular thyroid. Recommend thyroid US (ref: J Am Coll Radiol. 2015 Feb;12(2): 143-50). 4. Enlarged prostate. 5. Asbestos related pleural disease. If assessment for interstitial lung disease is desired, high-resolution chest CT without contrast is suggested. 6. Aortic atherosclerosis (ICD10-I70.0). Coronary artery calcification. Electronically Signed   By: Lorin Picket M.D.   On: 08/22/2020 16:29    ASSESSMENT:  1.  Cecal adenocarcinoma: -Colonoscopy by Dr. Abbey Chatters on 08/16/2020 with fungating/infiltrative submucosal and ulcerated partially obstructing mass found in the cecum and at the ileocecal valve. -Biopsy consistent with adenocarcinoma, MMR preserved. -CT CAP on 08/22/2020 with cycle carcinoma with small ileocolic mesenteric lymph nodes with no evidence of distant metastatic spread.  2. Social/family history: -He is retired from [employer]. He had asbestos exposure. -Maternal and paternal grandfathers died of prostate cancer. Father died of prostate cancer. 2 paternal uncles had prostate cancer.   PLAN:  1.  Cecal adenocarcinoma: -We discussed the reports of the CT scan and pathology in detail. -He does not have any metastatic disease clinically. -Proceed with right hemicolectomy on Monday by Dr. Constance Haw. -RTC 4 weeks after surgery to discuss further plan.  2. Prostate cancer: -Last PSA was 3.5 on 03/04/2020. His PSA will be checked today.  3. Family history: -Because of extensive family history and personal history of prostate cancer, recommend genetic testing.  4. Normocytic anemia: -We will check ferritin and iron panel. He is taking iron tablet daily. We will also check CBC today.   All questions were answered. The patient knows to call the clinic with any problems, questions or concerns.   Derek Jack, MD, 09/06/20 2:23 PM  [place] [phone]   I, Milinda Antis, am acting as a scribe for Dr. Sanda Linger.  I, Derek Jack MD, have reviewed the above documentation for accuracy and completeness, and I agree with the above.

## 2020-09-07 LAB — CEA: CEA: 6.6 ng/mL — ABNORMAL HIGH (ref 0.0–4.7)

## 2020-09-07 LAB — PSA: Prostatic Specific Antigen: 4.08 ng/mL — ABNORMAL HIGH (ref 0.00–4.00)

## 2020-09-07 NOTE — Patient Instructions (Signed)
Jerry Macdonald  09/07/2020     @PREFPERIOPPHARMACY @   Your procedure is scheduled on 09/12/2020.  Report to [name] at  [phone]  A.M.  Call this number if you have problems the morning of surgery:  [phone]   Remember:  Follow the enlclosed copy of diet instructions at the back of this packet.                   Take these medicines the morning of surgery with A SIP OF WATER proscar, flomax    Do not wear jewelry, make-up or nail polish.  Do not wear lotions, powders, or perfumes, or deodorant. Please brush your teeth.  Do not shave 48 hours prior to surgery.  Men may shave face and neck.  Do not bring valuables to the hospital.  [facility] is not responsible for any belongings or valuables.  Contacts, dentures or bridgework may not be worn into surgery.  Leave your suitcase in the car.  After surgery it may be brought to your room.  For patients admitted to the hospital, discharge time will be determined by your treatment team.  Patients discharged the day of surgery will not be allowed to drive home.   Name and phone number of your driver:   [name] Special instructions:  DO NOT smoke the morning of your procedure.  Please read over the following fact sheets that you were given. Anesthesia Post-op Instructions and Care and Recovery After Surgery       Open Colectomy, Care After This sheet gives you information about how to care for yourself after your procedure. Your health care provider may also give you more specific instructions. If you have problems or questions, contact your health care provider. What can I expect after the procedure? After the procedure, it is common to have:  Pain in your abdomen, especially along your incision.  Tiredness. Your energy level will return to normal over the next several weeks.  Constipation.  Nausea.  Difficulty urinating. Follow these instructions at home: Activity  You may be able to return  to most of your normal activities within 1-2 weeks, such as working, walking up stairs, and sexual activity.  Avoid activities that require a lot of energy for 4-6 weeks after surgery, such as running, climbing, and lifting heavy objects. Ask your health care provider what activities are safe for you.  Take rest breaks during the day as needed.  Do not drive for 1-2 weeks or until your health care provider says that it is safe.  Do not drive or use heavy machinery while taking prescription pain medicines.  Do not lift anything that is heavier than 10 lb (4.3 kg) until your health care provider says that it is safe. Incision care   Follow instructions from your health care provider about how to take care of your incision. Make sure you: ? Wash your hands with soap and water before you change your bandage (dressing). If soap and water are not available, use hand sanitizer. ? Change your dressing as told by your health care provider. ? Leave stitches (sutures) or staples in place. These skin closures may need to stay in place for 2 weeks or longer.  Avoid wearing tight clothing around your incision.  Protect your incision area from the sun.  Check your incision area every day for signs of infection. Check for: ? More redness, swelling, or pain. ? More fluid or  blood. ? Warmth. ? Pus or a bad smell. General instructions  Do not take baths, swim, or use a hot tub until your health care provider approves. Ask your health care provider when you may shower.  Take over-the-counter and prescription medicines, including stool softeners, only as told by your health care provider.  Eat a low-fat and low-fiber diet for the first 4 weeks after surgery.  Keep all follow-up visits as told by your health care provider. This is important. Contact a health care provider if:  You have more redness, swelling, or pain around your incision.  You have more fluid or blood coming from your  incision.  Your incision feels warm to the touch.  You have pus or a bad smell coming from your incision.  You have a fever or chills.  You do not have a bowel movement 2-3 days after surgery.  You cannot eat or drink for 24 hours or more.  You have persistent nausea and vomiting.  You have abdominal pain that gets worse and does not get better with medicine. Get help right away if:  You have chest pain.  You have shortness of breath.  You have pain or swelling in your legs.  Your incision breaks open after your sutures or staples have been removed.  You have bleeding from the rectum. This information is not intended to replace advice given to you by your health care provider. Make sure you discuss any questions you have with your health care provider. Document Revised: 08/02/2017 Document Reviewed: 05/21/2016 Elsevier Patient Education  [place].  Laparoscopic Colectomy, Care After This sheet gives you information about how to care for yourself after your procedure. Your health care provider may also give you more specific instructions. If you have problems or questions, contact your health care provider. What can I expect after the procedure? After your procedure, it is common to have the following:  Pain in your abdomen, especially in the incision areas. You will be given medicine to control the pain.  Tiredness. This is a normal part of the recovery process. Your energy level will return to normal over the next several weeks.  Changes in your bowel movements, such as constipation or needing to go more often. Talk with your health care provider about how to manage this. Follow these instructions at home: Medicines  Take over-the-counter and prescription medicines only as told by your health care provider.  Do not drive or use heavy machinery while taking prescription pain medicine.  Do not drink alcohol while taking prescription pain medicine.  If you  were prescribed an antibiotic medicine, use it as told by your health care provider. Do not stop using the antibiotic even if you start to feel better. Incision care   Follow instructions from your health care provider about how to take care of your incision areas. Make sure you: ? Keep your incisions clean and dry. ? Wash your hands with soap and water before and after applying medicine to the areas, and before and after changing your bandage (dressing). If soap and water are not available, use hand sanitizer. ? Change your dressing as told by your health care provider. ? Leave stitches (sutures), skin glue, or adhesive strips in place. These skin closures may need to stay in place for 2 weeks or longer. If adhesive strip edges start to loosen and curl up, you may trim the loose edges. Do not remove adhesive strips completely unless your health care provider tells you  to do that.  Do not wear tight clothing over the incisions. Tight clothing may rub and irritate the incision areas, which may cause the incisions to open.  Do not take baths, swim, or use a hot tub until your health care provider approves. Ask your health care provider if you can take showers. You may only be allowed to take sponge baths for bathing.  Check your incision area every day for signs of infection. Check for: ? More redness, swelling, or pain. ? More fluid or blood. ? Warmth. ? Pus or a bad smell. Activity  Avoid lifting anything that is heavier than 10 lb (4.5 kg) for 2 weeks or until your health care provider says it is okay.  You may resume normal activities as told by your health care provider. Ask your health care provider what activities are safe for you.  Take rest breaks during the day as needed. Eating and drinking  Follow instructions from your health care provider about what you can eat after surgery.  To prevent or treat constipation while you are taking prescription pain medicine, your health care  provider may recommend that you: ? Drink enough fluid to keep your urine clear or pale yellow. ? Take over-the-counter or prescription medicines. ? Eat foods that are high in fiber, such as fresh fruits and vegetables, whole grains, and beans. ? Limit foods that are high in fat and processed sugars, such as fried and sweet foods. General instructions  Ask your health care provider when you will need an appointment to get your sutures or staples removed.  Keep all follow-up visits as told by your health care provider. This is important. Contact a health care provider if:  You have more redness, swelling, or pain around your incisions.  You have more fluid or blood coming from the incisions.  Your incisions feel warm to the touch.  You have pus or a bad smell coming from your incisions or your dressing.  You have a fever.  You have an incision that breaks open (edges not staying together) after sutures or staples have been removed. Get help right away if:  You develop a rash.  You have chest pain or difficulty breathing.  You have pain or swelling in your legs.  You feel light-headed or you faint.  Your abdomen swells (becomes distended).  You have nausea or vomiting.  You have blood in your stool (feces). This information is not intended to replace advice given to you by your health care provider. Make sure you discuss any questions you have with your health care provider. Document Revised: 05/09/2018 Document Reviewed: 05/21/2016 Elsevier Patient Education  2020 [place] Anesthesia, Adult, Care After This sheet gives you information about how to care for yourself after your procedure. Your health care provider may also give you more specific instructions. If you have problems or questions, contact your health care provider. What can I expect after the procedure? After the procedure, the following side effects are common:  Pain or discomfort at the IV  site.  Nausea.  Vomiting.  Sore throat.  Trouble concentrating.  Feeling cold or chills.  Weak or tired.  Sleepiness and fatigue.  Soreness and body aches. These side effects can affect parts of the body that were not involved in surgery. Follow these instructions at home:  For at least 24 hours after the procedure:  Have a responsible adult stay with you. It is important to have someone help care for you until  you are awake and alert.  Rest as needed.  Do not: ? Participate in activities in which you could fall or become injured. ? Drive. ? Use heavy machinery. ? Drink alcohol. ? Take sleeping pills or medicines that cause drowsiness. ? Make important decisions or sign legal documents. ? Take care of children on your own. Eating and drinking  Follow any instructions from your health care provider about eating or drinking restrictions.  When you feel hungry, start by eating small amounts of foods that are soft and easy to digest (bland), such as toast. Gradually return to your regular diet.  Drink enough fluid to keep your urine pale yellow.  If you vomit, rehydrate by drinking water, juice, or clear broth. General instructions  If you have sleep apnea, surgery and certain medicines can increase your risk for breathing problems. Follow instructions from your health care provider about wearing your sleep device: ? Anytime you are sleeping, including during daytime naps. ? While taking prescription pain medicines, sleeping medicines, or medicines that make you drowsy.  Return to your normal activities as told by your health care provider. Ask your health care provider what activities are safe for you.  Take over-the-counter and prescription medicines only as told by your health care provider.  If you smoke, do not smoke without supervision.  Keep all follow-up visits as told by your health care provider. This is important. Contact a health care provider if:  You  have nausea or vomiting that does not get better with medicine.  You cannot eat or drink without vomiting.  You have pain that does not get better with medicine.  You are unable to pass urine.  You develop a skin rash.  You have a fever.  You have redness around your IV site that gets worse. Get help right away if:  You have difficulty breathing.  You have chest pain.  You have blood in your urine or stool, or you vomit blood. Summary  After the procedure, it is common to have a sore throat or nausea. It is also common to feel tired.  Have a responsible adult stay with you for the first 24 hours after general anesthesia. It is important to have someone help care for you until you are awake and alert.  When you feel hungry, start by eating small amounts of foods that are soft and easy to digest (bland), such as toast. Gradually return to your regular diet.  Drink enough fluid to keep your urine pale yellow.  Return to your normal activities as told by your health care provider. Ask your health care provider what activities are safe for you. This information is not intended to replace advice given to you by your health care provider. Make sure you discuss any questions you have with your health care provider. Document Revised: 08/23/2017 Document Reviewed: 04/05/2017 Elsevier Patient Education  [place]. How to Use Chlorhexidine for Bathing Chlorhexidine gluconate (CHG) is a germ-killing (antiseptic) solution that is used to clean the skin. It can get rid of the bacteria that normally live on the skin and can keep them away for about 24 hours. To clean your skin with CHG, you may be given:  A CHG solution to use in the shower or as part of a sponge bath.  A prepackaged cloth that contains CHG. Cleaning your skin with CHG may help lower the risk for infection:  While you are staying in the intensive care unit of the hospital.  If  you have a vascular access, such as  a central line, to provide short-term or long-term access to your veins.  If you have a catheter to drain urine from your bladder.  If you are on a ventilator. A ventilator is a machine that helps you breathe by moving air in and out of your lungs.  After surgery. What are the risks? Risks of using CHG include:  A skin reaction.  Hearing loss, if CHG gets in your ears.  Eye injury, if CHG gets in your eyes and is not rinsed out.  The CHG product catching fire. Make sure that you avoid smoking and flames after applying CHG to your skin. Do not use CHG:  If you have a chlorhexidine allergy or have previously reacted to chlorhexidine.  On babies younger than 4 months of age. How to use CHG solution  Use CHG only as told by your health care provider, and follow the instructions on the label.  Use the full amount of CHG as directed. Usually, this is one bottle. During a shower Follow these steps when using CHG solution during a shower (unless your health care provider gives you different instructions): 1. Start the shower. 2. Use your normal soap and shampoo to wash your face and hair. 3. Turn off the shower or move out of the shower stream. 4. Pour the CHG onto a clean washcloth. Do not use any type of brush or rough-edged sponge. 5. Starting at your neck, lather your body down to your toes. Make sure you follow these instructions: ? If you will be having surgery, pay special attention to the part of your body where you will be having surgery. Scrub this area for at least 1 minute. ? Do not use CHG on your head or face. If the solution gets into your ears or eyes, rinse them well with water. ? Avoid your genital area. ? Avoid any areas of skin that have broken skin, cuts, or scrapes. ? Scrub your back and under your arms. Make sure to wash skin folds. 6. Let the lather sit on your skin for 1-2 minutes or as long as told by your health care provider. 7. Thoroughly rinse your  entire body in the shower. Make sure that all body creases and crevices are rinsed well. 8. Dry off with a clean towel. Do not put any substances on your body afterward--such as powder, lotion, or perfume--unless you are told to do so by your health care provider. Only use lotions that are recommended by the manufacturer. 9. Put on clean clothes or pajamas. 10. If it is the night before your surgery, sleep in clean sheets.  During a sponge bath Follow these steps when using CHG solution during a sponge bath (unless your health care provider gives you different instructions): 1. Use your normal soap and shampoo to wash your face and hair. 2. Pour the CHG onto a clean washcloth. 3. Starting at your neck, lather your body down to your toes. Make sure you follow these instructions: ? If you will be having surgery, pay special attention to the part of your body where you will be having surgery. Scrub this area for at least 1 minute. ? Do not use CHG on your head or face. If the solution gets into your ears or eyes, rinse them well with water. ? Avoid your genital area. ? Avoid any areas of skin that have broken skin, cuts, or scrapes. ? Scrub your back and under your arms. Make  sure to wash skin folds. 4. Let the lather sit on your skin for 1-2 minutes or as long as told by your health care provider. 5. Using a different clean, wet washcloth, thoroughly rinse your entire body. Make sure that all body creases and crevices are rinsed well. 6. Dry off with a clean towel. Do not put any substances on your body afterward--such as powder, lotion, or perfume--unless you are told to do so by your health care provider. Only use lotions that are recommended by the manufacturer. 7. Put on clean clothes or pajamas. 8. If it is the night before your surgery, sleep in clean sheets. How to use CHG prepackaged cloths  Only use CHG cloths as told by your health care provider, and follow the instructions on the  label.  Use the CHG cloth on clean, dry skin.  Do not use the CHG cloth on your head or face unless your health care provider tells you to.  When washing with the CHG cloth: ? Avoid your genital area. ? Avoid any areas of skin that have broken skin, cuts, or scrapes. Before surgery Follow these steps when using a CHG cloth to clean before surgery (unless your health care provider gives you different instructions): 1. Using the CHG cloth, vigorously scrub the part of your body where you will be having surgery. Scrub using a back-and-forth motion for 3 minutes. The area on your body should be completely wet with CHG when you are done scrubbing. 2. Do not rinse. Discard the cloth and let the area air-dry. Do not put any substances on the area afterward, such as powder, lotion, or perfume. 3. Put on clean clothes or pajamas. 4. If it is the night before your surgery, sleep in clean sheets.  For general bathing Follow these steps when using CHG cloths for general bathing (unless your health care provider gives you different instructions). 1. Use a separate CHG cloth for each area of your body. Make sure you wash between any folds of skin and between your fingers and toes. Wash your body in the following order, switching to a new cloth after each step: ? The front of your neck, shoulders, and chest. ? Both of your arms, under your arms, and your hands. ? Your stomach and groin area, avoiding the genitals. ? Your right leg and foot. ? Your left leg and foot. ? The back of your neck, your back, and your buttocks. 2. Do not rinse. Discard the cloth and let the area air-dry. Do not put any substances on your body afterward--such as powder, lotion, or perfume--unless you are told to do so by your health care provider. Only use lotions that are recommended by the manufacturer. 3. Put on clean clothes or pajamas. Contact a health care provider if:  Your skin gets irritated after scrubbing.  You  have questions about using your solution or cloth. Get help right away if:  Your eyes become very red or swollen.  Your eyes itch badly.  Your skin itches badly and is red or swollen.  Your hearing changes.  You have trouble seeing.  You have swelling or tingling in your mouth or throat.  You have trouble breathing.  You swallow any chlorhexidine. Summary  Chlorhexidine gluconate (CHG) is a germ-killing (antiseptic) solution that is used to clean the skin. Cleaning your skin with CHG may help to lower your risk for infection.  You may be given CHG to use for bathing. It may be in a bottle  or in a prepackaged cloth to use on your skin. Carefully follow your health care provider's instructions and the instructions on the product label.  Do not use CHG if you have a chlorhexidine allergy.  Contact your health care provider if your skin gets irritated after scrubbing. This information is not intended to replace advice given to you by your health care provider. Make sure you discuss any questions you have with your health care provider. Document Revised: 11/06/2018 Document Reviewed: 07/18/2017 Elsevier Patient Education  [place].

## 2020-09-08 ENCOUNTER — Encounter (HOSPITAL_COMMUNITY): Payer: Self-pay | Admitting: Anesthesiology

## 2020-09-08 ENCOUNTER — Other Ambulatory Visit: Payer: Medicare Other

## 2020-09-08 NOTE — Addendum Note (Signed)
Addended by: Legrand Rams B on: 09/08/2020 01:42 PM   Modules accepted: Orders

## 2020-09-08 NOTE — Progress Notes (Signed)
Free t 4

## 2020-09-09 ENCOUNTER — Other Ambulatory Visit (HOSPITAL_COMMUNITY)
Admission: RE | Admit: 2020-09-09 | Discharge: 2020-09-09 | Disposition: A | Discharge status: Home / Self Care | Payer: Medicare Other | Source: Ambulatory Visit | Attending: General Surgery | Admitting: General Surgery

## 2020-09-09 ENCOUNTER — Other Ambulatory Visit: Payer: Self-pay

## 2020-09-09 ENCOUNTER — Encounter (HOSPITAL_COMMUNITY)
Admission: RE | Admit: 2020-09-09 | Discharge: 2020-09-09 | Disposition: A | Discharge status: Home / Self Care | Payer: Medicare Other | Source: Ambulatory Visit | Attending: General Surgery | Admitting: General Surgery

## 2020-09-09 ENCOUNTER — Encounter (HOSPITAL_COMMUNITY): Payer: Self-pay

## 2020-09-09 DIAGNOSIS — Z01812 Encounter for preprocedural laboratory examination: Secondary | ICD-10-CM | POA: Diagnosis not present

## 2020-09-09 DIAGNOSIS — Z20822 Contact with and (suspected) exposure to covid-19: Secondary | ICD-10-CM | POA: Diagnosis not present

## 2020-09-09 HISTORY — DX: Unspecified hearing loss, unspecified ear: H91.90

## 2020-09-09 LAB — PREPARE RBC (CROSSMATCH)

## 2020-09-09 MED ORDER — ENSURE PRE-SURGERY PO LIQD
592.0000 mL | Freq: Once | ORAL | Status: DC
  Filled 2020-09-09: qty 592

## 2020-09-09 MED ORDER — ENSURE PRE-SURGERY PO LIQD
296.0000 mL | Freq: Once | ORAL | Status: DC
  Filled 2020-09-09: qty 296

## 2020-09-10 LAB — SARS CORONAVIRUS 2 (TAT 6-24 HRS): SARS Coronavirus 2: NEGATIVE

## 2020-09-10 LAB — T3: T3, Total: 98 ng/dL (ref 71–180)

## 2020-09-10 LAB — T4: T4, Total: 5.9 ug/dL (ref 4.5–12.0)

## 2020-09-12 ENCOUNTER — Telehealth: Payer: Self-pay

## 2020-09-12 DIAGNOSIS — C180 Malignant neoplasm of cecum: Secondary | ICD-10-CM | POA: Diagnosis present

## 2020-09-12 LAB — BPAM RBC
Blood Product Expiration Date: 202202112359
Blood Product Expiration Date: 202202112359
ISSUE DATE / TIME: 202201081808
Unit Type and Rh: 5100
Unit Type and Rh: 5100

## 2020-09-12 LAB — TYPE AND SCREEN
ABO/RH(D): O POS
Antibody Screen: NEGATIVE
Unit division: 0
Unit division: 0

## 2020-09-12 MED ORDER — SODIUM CHLORIDE 0.9 % IV SOLN
INTRAVENOUS | Status: AC
  Filled 2020-09-12: qty 2

## 2020-09-12 NOTE — Telephone Encounter (Signed)
Pt was referred urgently by Dr Constance Haw for enlarged thyroid. She placed a note in the referral note that his surgery was canceled with her until he was to see you. Do you have everything you would need?

## 2020-09-12 NOTE — Telephone Encounter (Signed)
Called pt and left VM

## 2020-09-12 NOTE — Telephone Encounter (Signed)
I see that thyroid ultrasound  was ordered for him , but I do not see any thyroid studies resulted. We need those before his consult.

## 2020-09-13 NOTE — Telephone Encounter (Signed)
Noted  

## 2020-09-13 NOTE — Telephone Encounter (Signed)
Per Dr. Dorris Fetch we can sch new pt appt for after his ultrasound. He has to have that before his appt.

## 2020-09-13 NOTE — Telephone Encounter (Signed)
Patient's POA, Jerry Macdonald called back and said she is awaiting a call about scheduling his ultrasound. I advised her to keep [place] updated and per Dr Dorris Fetch we would schedule a consult with him after that is completed.

## 2020-09-13 NOTE — Telephone Encounter (Signed)
Jerry Macdonald called back and said [name] is sch for 1/17

## 2020-09-14 ENCOUNTER — Ambulatory Visit (HOSPITAL_COMMUNITY)
Admission: RE | Admit: 2020-09-14 | Discharge: 2020-09-14 | Disposition: A | Discharge status: Home / Self Care | Payer: Medicare Other | Source: Ambulatory Visit | Attending: General Surgery | Admitting: General Surgery

## 2020-09-14 ENCOUNTER — Other Ambulatory Visit: Payer: Self-pay

## 2020-09-14 DIAGNOSIS — E041 Nontoxic single thyroid nodule: Secondary | ICD-10-CM | POA: Insufficient documentation

## 2020-09-14 DIAGNOSIS — E042 Nontoxic multinodular goiter: Secondary | ICD-10-CM | POA: Diagnosis not present

## 2020-09-15 ENCOUNTER — Ambulatory Visit: Payer: Medicare Other | Admitting: Urology

## 2020-09-16 ENCOUNTER — Ambulatory Visit: Payer: Medicare Other | Admitting: Urology

## 2020-09-19 ENCOUNTER — Ambulatory Visit (HOSPITAL_COMMUNITY): Admission: RE | Admit: 2020-09-19 | Payer: Medicare Other | Source: Ambulatory Visit

## 2020-09-20 ENCOUNTER — Ambulatory Visit: Payer: Medicare Other | Admitting: "Endocrinology

## 2020-09-20 ENCOUNTER — Other Ambulatory Visit: Payer: Self-pay | Admitting: General Surgery

## 2020-09-20 DIAGNOSIS — E041 Nontoxic single thyroid nodule: Secondary | ICD-10-CM

## 2020-09-20 NOTE — Telephone Encounter (Signed)
Looks like pt has done [place], pt Biopsy is sch for 1/19

## 2020-09-21 ENCOUNTER — Ambulatory Visit (HOSPITAL_COMMUNITY)
Admission: RE | Admit: 2020-09-21 | Discharge: 2020-09-21 | Disposition: A | Discharge status: Home / Self Care | Payer: Medicare Other | Source: Ambulatory Visit | Attending: General Surgery | Admitting: General Surgery

## 2020-09-21 ENCOUNTER — Other Ambulatory Visit: Payer: Self-pay

## 2020-09-21 ENCOUNTER — Encounter (HOSPITAL_COMMUNITY): Payer: Self-pay

## 2020-09-21 DIAGNOSIS — E0789 Other specified disorders of thyroid: Secondary | ICD-10-CM | POA: Diagnosis not present

## 2020-09-21 DIAGNOSIS — C61 Malignant neoplasm of prostate: Secondary | ICD-10-CM | POA: Diagnosis not present

## 2020-09-21 DIAGNOSIS — C180 Malignant neoplasm of cecum: Secondary | ICD-10-CM | POA: Diagnosis not present

## 2020-09-21 DIAGNOSIS — D440 Neoplasm of uncertain behavior of thyroid gland: Secondary | ICD-10-CM | POA: Insufficient documentation

## 2020-09-21 DIAGNOSIS — E042 Nontoxic multinodular goiter: Secondary | ICD-10-CM | POA: Diagnosis not present

## 2020-09-21 DIAGNOSIS — E041 Nontoxic single thyroid nodule: Secondary | ICD-10-CM

## 2020-09-21 MED ORDER — LIDOCAINE HCL (PF) 2 % IJ SOLN
INTRAMUSCULAR | Status: AC
  Filled 2020-09-21: qty 10

## 2020-09-21 NOTE — Procedures (Signed)
PreOperative Dx: Bilateral thyroid nodules Postoperative Dx: Bilateral thyroid nodules Procedure:   US guided FNA of bilateral thyroid nodules Radiologist:  Thornton Papas Anesthesia:  6 ml of 2% lidocaine Specimen:  FNA x 5 of each nodule  EBL:   < 1 ml Complications: None

## 2020-09-22 ENCOUNTER — Ambulatory Visit (HOSPITAL_COMMUNITY): Payer: Medicare Other

## 2020-09-22 ENCOUNTER — Encounter: Payer: Self-pay | Admitting: "Endocrinology

## 2020-09-22 ENCOUNTER — Ambulatory Visit (INDEPENDENT_AMBULATORY_CARE_PROVIDER_SITE_OTHER): Payer: Medicare Other | Admitting: "Endocrinology

## 2020-09-22 VITALS — BP 112/64 | HR 72 | Ht 70.5 in | Wt 198.2 lb

## 2020-09-22 DIAGNOSIS — E042 Nontoxic multinodular goiter: Secondary | ICD-10-CM | POA: Insufficient documentation

## 2020-09-22 LAB — CYTOLOGY - NON PAP

## 2020-09-22 NOTE — Progress Notes (Signed)
Endocrinology Consult Note                                            09/22/2020, 3:48 PM   Subjective:    Patient ID: Jerry Macdonald, male    DOB: 1931/02/28, PCP [name], MD   Past Medical History:  Diagnosis Date  . Abnormal prostate biopsy   . Back pain    with radiculopathy  . Cataracts, bilateral   . Constipation   . HOH (hard of hearing)   . Hyperlipidemia   . Hypertrophy of prostate   . Malaise and fatigue   . Obesity   . Pyogenic granuloma    left cheeck  . S/P colonoscopy June 2011   rectal bleeding secondary to hemorrhoids  . S/P colonoscopy 2007   [facility], diverticulosis per Dr. Juel Burrow note  . S/P endoscopy June 2011   Barrett's, mild gastritis and duodenitis  . Seborrheic keratosis   . Sleep apnea    Past Surgical History:  Procedure Laterality Date  . BIOPSY  03/27/2019   Procedure: BIOPSY;  Surgeon: Danie Binder, MD;  [facility];  Service: Endoscopy;;  . BIOPSY  08/16/2020   Procedure: BIOPSY;  Surgeon: Eloise Harman, DO;  [facility];  Service: Endoscopy;;  . COLONOSCOPY  JUN 2011   PAN-COLONIC DIVERTICULOSIS  . COLONOSCOPY  2007   [place]  . COLONOSCOPY WITH PROPOFOL N/A 08/16/2020   Procedure: COLONOSCOPY WITH PROPOFOL;  Surgeon: Eloise Harman, DO;  [facility];  Service: Endoscopy;  Laterality: N/A;  10:45am  . ESOPHAGOGASTRODUODENOSCOPY  06/11/2011   Procedure: ESOPHAGOGASTRODUODENOSCOPY (EGD);  Surgeon: Dorothyann Peng, MD;  [facility];  Service: Endoscopy;  Laterality: N/A;  11:00  . ESOPHAGOGASTRODUODENOSCOPY N/A 05/21/2014   Dr. Fields:Barrett's esophagus/ Medium sized hiatal hernia. path with indefinite dysplasia.   Marland Kitchen ESOPHAGOGASTRODUODENOSCOPY N/A 12/20/2014   Dr. Clayburn Pert gastric polyp/large HH/6 cm segment of Barrett's esophagus, path with hyperplastic gastric polyp, no dysplasia. Surveillance April 2017 due   . ESOPHAGOGASTRODUODENOSCOPY N/A 01/20/2016    Procedure: ESOPHAGOGASTRODUODENOSCOPY (EGD);  Surgeon: Danie Binder, MD;  [facility];  Service: Endoscopy;  Laterality: N/A;  1230pm  . ESOPHAGOGASTRODUODENOSCOPY N/A 03/27/2019   Procedure: ESOPHAGOGASTRODUODENOSCOPY (EGD);  Surgeon: Danie Binder, MD;  [facility];  Service: Endoscopy;  Laterality: N/A;  12:15pm  . HERNIA REPAIR    . UMBILICAL HERNIA REPAIR    . VARICOSE VEIN SURGERY     Social History   Socioeconomic History  . Marital status: Divorced    Spouse name: Not on file  . Number of children: Not on file  . Years of education: Not on file  . Highest education level: Not on file  Occupational History  . Not on file  Tobacco Use  . Smoking status: Former Smoker    Packs/day: 0.50    Years: 2.00    Pack years: 1.00    Types: Cigarettes    Quit date: 09/10/1963    Years since quitting: 57.0  . Smokeless tobacco: Never Used  . Tobacco comment: quit in 1965  Vaping Use  . Vaping Use: Former  Substance and Sexual Activity  . Alcohol use: Not Currently  . Drug use: No  . Sexual activity: Not Currently  Other Topics Concern  .  Not on file  Social History Narrative  . Not on file   Social Determinants of Health   Financial Resource Strain: Not on file  Food Insecurity: Not on file  Transportation Needs: No Transportation Needs  . Lack of Transportation (Medical): No  . Lack of Transportation (Non-Medical): No  Physical Activity: Inactive  . Days of Exercise per Week: 0 days  . Minutes of Exercise per Session: 0 min  Stress: Not on file  Social Connections: Not on file   Family History  Problem Relation Age of Onset  . Cancer Father   . Colon polyps Neg Hx   . Colon cancer Neg Hx    Outpatient Encounter Medications as of 09/22/2020  Medication Sig  . acetaminophen (TYLENOL) 500 MG tablet Take 500 mg by mouth every 8 (eight) hours as needed for moderate pain.  Marland Kitchen CALCIUM PO Take 600 mg by mouth daily. With magnesium  . finasteride  (PROSCAR) 5 MG tablet Take 1 tablet (5 mg total) by mouth daily.  Marland Kitchen GAVILYTE-G 236 g solution SMARTSIG:Milliliter(s) By Mouth  . magnesium oxide (MAG-OX) 400 MG tablet Take 400 mg by mouth 2 (two) times daily.  . metroNIDAZOLE (FLAGYL) 500 MG tablet Take 1 tablet (500 mg total) by mouth as directed. The day before surgery take 2 flagyl 542m tablets at 2 pm, 3pm,10pm  . Multiple Vitamins-Minerals (MULTIVITAMINS THER. W/MINERALS) TABS Take 1 tablet by mouth daily.  .Marland Kitchenneomycin (MYCIFRADIN) 500 MG tablet Take 2 tablets (1,000 mg total) by mouth as directed for 1 dose. The day before surgery take 2 neomycin 502mtablets at 2 pm, 3pm,10pm  . pantoprazole (PROTONIX) 40 MG tablet TAKE 1 TABLET TWICE A DAY BEFORE MEALS (Patient taking differently: Take 40 mg by mouth daily.)  . tamsulosin (FLOMAX) 0.4 MG CAPS capsule Take 1 capsule (0.4 mg total) by mouth daily.   No facility-administered encounter medications on file as of 09/22/2020.   ALLERGIES: Allergies  Allergen Reactions  . Penicillins     Swelling of hands and feet - severe Did it involve swelling of the face/tongue/throat, SOB, or low BP? No Did it involve sudden or severe rash/hives, skin peeling, or any reaction on the inside of your mouth or nose? No Did you need to seek medical attention at a hospital or doctor's office? No When did it last happen?1950s If all above answers are "NO", may proceed with cephalosporin use.     VACCINATION STATUS: Immunization History  Administered Date(s) Administered  . DTaP 06/03/2013  . H1N1 08/12/2008  . Influenza Whole 07/07/2007, 05/20/2008  . Influenza-Unspecified 06/03/2013, 06/03/2018  . Pneumococcal-Unspecified 06/03/2013, 06/03/2014  . Varicella 06/03/2013    HPI DaJAKING THAYERs 89.o. male who presents today with a medical history as above. he is being seen in consultation for large multinodular goiter requested by HaCelene SquibbMD.  Patient is accompanied by his friend  who is also his power of attorney.  He is not an optimal historian.  History is obtained mainly from chart review. he has been dealing with symptoms of fatigue, positional shortness of breath, sleep apnea for several years. He denies any significant weight change. He was recently found to have large multinodular goiter on thyroid ultrasound.  He underwent fine-needle aspiration of right sided 5.3 cm nodule with benign outcomes.  Biopsy of suspicious 1.5 cm left lobe nodule was performed, results are pending. He is also recently diagnosed with cecal/colon cancer, scheduled to have surgery on September 28, 2020.  This surgery is scheduled to be performed laparoscopically. He is not on any thyroid hormone or antithyroid interventions.  His recent thyroid function test where consistent with  suppressed TSH and low normal T4 of 5.9, normal T3 of 98. He has minimally fluctuating body weight.  He denies palpitations, tremors, nor heat/cold intolerance.  He is a former smoker, denies any exposure to neck radiations. His other medical problems include BPH/prostate cancer, Barrett's esophagus, sleep apnea, and recently diagnosed cecal/colon cancer. He is cared for by an elderly friend Dola Argyle is also his power of attorney.   Review of Systems  Constitutional: no recent weight gain/loss, no fatigue, no subjective hyperthermia, no subjective hypothermia Eyes: no blurry vision, no xerophthalmia ENT: no sore throat, no nodules palpated in throat, no dysphagia/odynophagia, no hoarseness Cardiovascular: no Chest Pain, no Shortness of Breath, no palpitations, no leg swelling Respiratory: no cough, no shortness of breath Gastrointestinal: no Nausea/Vomiting/Diarhhea Musculoskeletal: no muscle/joint aches Skin: no rashes Neurological: no tremors, no numbness, no tingling, no dizziness Psychiatric: no depression, no anxiety  Objective:    Vitals with BMI 09/22/2020 09/21/2020 09/09/2020  Height 5' 10.5" -  5' 10.5"  Weight 198 lbs 3 oz - 199 lbs 5 oz  BMI 23.76 - 28.31  Systolic 517 616 073  Diastolic 64 70 62  Pulse 72 64 74    BP 112/64   Pulse 72   Ht 5' 10.5" (1.791 m)   Wt 198 lb 3.2 oz (89.9 kg)   BMI 28.04 kg/m   Wt Readings from Last 3 Encounters:  09/22/20 198 lb 3.2 oz (89.9 kg)  09/09/20 199 lb 4.7 oz (90.4 kg)  09/06/20 199 lb 6.4 oz (90.4 kg)    Physical Exam  Constitutional:  Body mass index is 28.04 kg/m.,  not in acute distress, normal state of mind Eyes: PERRLA, EOMI, no exophthalmos ENT: moist mucous membranes, + large nodular goiter with possible extension to substernal region, + engorged  neck veins.  No gross cervical lymphadenopathy, + Pemberton sign Cardiovascular: normal precordial activity, Regular Rate and Rhythm, no Murmur/Rubs/Gallops Respiratory:  adequate breathing efforts, no gross chest deformity, Clear to auscultation bilaterally Gastrointestinal: abdomen soft, Non -tender, No distension, Bowel Sounds present, no gross organomegaly Musculoskeletal: no gross deformities, strength intact in all four extremities Skin: moist, warm, no rashes Neurological: no tremor with outstretched hands, Deep tendon reflexes normal in bilateral lower extremities.  CMP ( most recent) CMP     Component Value Date/Time   NA 140 09/06/2020 1525   K 4.6 09/06/2020 1525   CL 108 09/06/2020 1525   CO2 25 09/06/2020 1525   GLUCOSE 109 (H) 09/06/2020 1525   BUN 19 09/06/2020 1525   CREATININE 0.70 09/06/2020 1525   CALCIUM 8.8 (L) 09/06/2020 1525   PROT 6.7 09/06/2020 1525   ALBUMIN 3.7 09/06/2020 1525   AST 18 09/06/2020 1525   ALT 13 09/06/2020 1525   ALKPHOS 48 09/06/2020 1525   BILITOT 0.8 09/06/2020 1525   GFRNONAA >60 09/06/2020 1525   GFRAA  02/03/2010 1249    >60        The eGFR has been calculated using the MDRD equation. This calculation has not been validated in all clinical situations. eGFR's persistently <60 mL/min signify possible Chronic  Kidney Disease.     Diabetic Labs (most recent): No results found for: HGBA1C   Lipid Panel ( most recent) Lipid Panel     Component Value Date/Time   CHOL 139 08/12/2008 2244  TRIG 88 08/12/2008 2244   HDL 39 (L) 08/12/2008 2244   CHOLHDL 3.6 Ratio 08/12/2008 2244   VLDL 18 08/12/2008 2244   LDLCALC 82 08/12/2008 2244      Lab Results  Component Value Date   TSH 0.075 (L) 09/06/2020   TSH 0.772 08/09/2007    September 14, 2020 thyroid ultrasound: Right lobe measures 13.8 x 6.7 x 6.4 cm with large 5.3 cm nodule which was biopsied and findings were benign. Left lobe measured 9.9 x 4.9 x 3.5 cm with 2 nodules measuring 4.8 cm Pseudonodule not biopsied and 1.5 cm-suspicious biopsied.  Fine-needle aspiration biopsy results of only thyroid nodule is available today showing benign follicular nodule.  Assessment & Plan:   1. Multinodular goiter - JOHANN SANTONE  is being seen at a kind request of [place] Crane, Edwinna Areola, MD. - I have reviewed his available thyroid records and clinically evaluated the patient. - Based on these reviews, he has large multinodular compressive goiter. -His biopsy results are only partially available to review today.  The right sided nodule is consistent with benign follicular nodule, left-sided nodule results are pending.   -Regardless of biopsy results, this patient will benefit from definitive treatment involving total thyroidectomy. -However, he is scheduled for laparoscopic colon surgery for cecal/colon cancer on September 28, 2020.  If this large multinodular goiter does not interfere with his intubation, his thyroid surgery may follow in 4 to 5 weeks after his colon surgery. -His recent thyroid function test do not suggest primary hyperthyroidism given normal T4 and T3.  -His recent TSH was suppressed, however he does not warrant antithyroid intervention at this time. -He is made aware of the fact that he will need thyroid hormone supplement if he ends up  undergoing total thyroidectomy which will be a choice of surgery for his thyroid given large multinodular bilateral goiter.    - I did not initiate any new prescriptions today.  He will be on a phone visit on Monday to discuss their remaining biopsy results and plan forward.   - he is advised to maintain close follow up with [name], MD for primary care needs.  I encouraged him to keep close follow-up with Dr. Blake Divine for his planned surgeries.   - Time spent with the patient: 60 minutes, of which >50% was spent in  counseling him about his large multinodular goiter and the rest in obtaining information about his symptoms, reviewing his previous labs/studies ( including abstractions from other facilities),  evaluations, and treatments,  and developing a plan to confirm diagnosis and long term treatment based on the latest standards of care/guidelines; and documenting his care.  Jerry Macdonald participated in the discussions, expressed understanding, and voiced agreement with the above plans.  All questions were answered to his satisfaction. he is encouraged to contact clinic should he have any questions or concerns prior to his return visit.  Follow up plan: Return in about 4 days (around 09/26/2020), or Phone Visit, awaiting his biopsy results.   Glade Lloyd, MD [facility] [address] [place], [place] [address] Phone: [phone]  Fax: [phone]     09/22/2020, 3:48 PM  This note was partially dictated with voice recognition software. Similar sounding words can be transcribed inadequately or may not  be corrected upon review.

## 2020-09-23 NOTE — Patient Instructions (Signed)
Jerry Macdonald  09/23/2020     @PREFPERIOPPHARMACY @   Your procedure is scheduled on  09/28/2020  .  Report to [facility] at  1030  A.M.   Call this number if you have problems the morning of surgery:  [phone]   Remember:  Follow the prep instructions given to you by Dr Constance Haw.                       Take these medicines the morning of surgery with A SIP OF WATER  Proscar, protonix, flomax.   Please brush your teeth.  Do not wear jewelry, make-up or nail polish.  Do not wear lotions, powders, or perfumes, or deodorant.  Do not shave 48 hours prior to surgery.  Men may shave face and neck.  Do not bring valuables to the hospital.  [facility] is not responsible for any belongings or valuables.  Contacts, dentures or bridgework may not be worn into surgery.  Leave your suitcase in the car.  After surgery it may be brought to your room.  For patients admitted to the hospital, discharge time will be determined by your treatment team.  Patients discharged the day of surgery will not be allowed to drive home and they will need someone with them for 24 hours.   Special instructions:  DO NOT smoke (tobacco or vape) the morning of your procedure.   Shower with CHG the night before and the morning of your procedure. DO NOT put CHG on your face, hair or genitals(private parts) or open wounds or sores.  Dry off with a clean towel and put on clean cloths to sleep in and clean sheets on your bed before you go to sleep.   After your morning shower, put on clean, comfortable clothes before you come to the hospital.    Please read over the following fact sheets that you were given. Anesthesia Post-op Instructions and Care and Recovery After Surgery       Minimally Invasive Total Colectomy, Care After This sheet gives you information about how to care for yourself after your procedure. Your health care provider may also give you more specific instructions. If you  have problems or questions, contact your health care provider. What can I expect after the procedure? After your procedure, it is common to have the following:  Pain in your abdomen, especially in the incision areas. You will be given medicine to control the pain.  Tiredness. This is a normal part of the recovery process. Your energy level will return to normal over the next several weeks.  Changes in your bowel movements, especially having bowel movements more often. Talk with your health care provider about how to manage this. Follow these instructions at home: Medicines  Take over-the-counter and prescription medicines only as told by your health care provider.  If you were prescribed an antibiotic medicine, take it as told by your health care provider. Do not stop using the antibiotic even if you start to feel better.  Ask your health care provider if the medicine prescribed to you requires you to avoid driving or using machinery. Eating and drinking  Follow instructions from your health care provider about what you can eat after surgery. You may be asked to begin with a diet that is low in fiber.  Do not drink alcohol if: ? Your health care provider tells you not to drink. ? You are pregnant, may be pregnant,  or are planning to become pregnant.  If you drink alcohol: ? Limit how much you use to:  0-1 drink a day for women.  0-2 drinks a day for men. ? Be aware of how much alcohol is in your drink. In the [place] one drink equals one 12 oz bottle of beer (355 mL), one 5 oz glass of wine (148 mL), or one 1 oz glass of hard liquor (44 mL). Incision care  Follow instructions from your health care provider about how to take care of your incisions. Make sure you: ? Wash your hands with soap and water for at least 20 seconds before and after applying medicine to the area, and before and after changing your bandage (dressing). If soap and water are not available, use hand  sanitizer. ? Change your dressing as told by your health care provider. ? Leave stitches (sutures) or staples in place. These skin closures may need to stay in place for 2 weeks or longer. If adhesive strip edges start to loosen and curl up, you may trim the loose edges. Do not remove adhesive strips completely unless your health care provider tells you to do that.  Keep your incisions clean and dry.  Do not wear tight clothing over the incisions. Tight clothing may rub and irritate the incision areas, which may cause the incisions to open.  Do not take baths, swim, or use a hot tub until your health care provider approves. Ask your health care provider if you may take showers. You may only be allowed to take sponge baths.  Check your incision area every day for signs of infection. Check for: ? More redness, swelling, or pain. ? Fluid or blood. ? Warmth. ? Pus or a bad smell.   Activity  Rest as told by your health care provider.  Avoid sitting for a long time without moving. Get up to take short walks every 1-2 hours. This is important to improve blood flow and breathing. Ask for help if you feel weak or unsteady.  Do not lift anything that is heavier than 10 lb (4.5 kg), or the limit that you are told, until your health care provider says that it is safe.  Return to your normal activities as told by your health care provider. Ask your health care provider what activities are safe for you. General instructions  Keep all follow-up visits as told by your health care provider. This is important. You may need a follow-up visit to remove sutures or staples. Contact a health care provider if:  You have more redness, swelling, or pain around your incisions.  You have fluid or blood coming from the incisions.  Your incisions feel warm to the touch.  You have pus or a bad smell coming from your incisions or your dressing.  You have a fever.  Your incisions break open after sutures or  staples have been removed. Get help right away if:  You develop a rash.  You have chest pain or difficulty breathing.  You have pain or swelling in your legs.  You feel light-headed or you faint.  Your abdomen swells (becomes distended).  You have nausea or vomiting.  You have blood in your stool. Summary  After the procedure, it is common to have pain in the abdomen, tiredness, or changes in bowel movements.  Follow instructions from your health care provider about how to care for your incisions.  Begin your diet with low-fiber foods. Your health care provider will tell you  when to resume your regular diet.  Contact a health care provider if you have any signs of infection, such as a fever, more redness, swelling, or pain around your incisions, or a bad smell coming from your incisions.  Get help right away if you have a rash, nausea, chest pain, pain or swelling in your legs, or blood in your stool. This information is not intended to replace advice given to you by your health care provider. Make sure you discuss any questions you have with your health care provider. Document Revised: 07/29/2019 Document Reviewed: 07/29/2019 Elsevier Patient Education  2021 [employer] General Anesthesia, Adult, Care After This sheet gives you information about how to care for yourself after your procedure. Your health care provider may also give you more specific instructions. If you have problems or questions, contact your health care provider. What can I expect after the procedure? After the procedure, the following side effects are common:  Pain or discomfort at the IV site.  Nausea.  Vomiting.  Sore throat.  Trouble concentrating.  Feeling cold or chills.  Feeling weak or tired.  Sleepiness and fatigue.  Soreness and body aches. These side effects can affect parts of the body that were not involved in surgery. Follow these instructions at home: For the time period you  were told by your health care provider:  Rest.  Do not participate in activities where you could fall or become injured.  Do not drive or use machinery.  Do not drink alcohol.  Do not take sleeping pills or medicines that cause drowsiness.  Do not make important decisions or sign legal documents.  Do not take care of children on your own.   Eating and drinking  Follow any instructions from your health care provider about eating or drinking restrictions.  When you feel hungry, start by eating small amounts of foods that are soft and easy to digest (bland), such as toast. Gradually return to your regular diet.  Drink enough fluid to keep your urine pale yellow.  If you vomit, rehydrate by drinking water, juice, or clear broth. General instructions  If you have sleep apnea, surgery and certain medicines can increase your risk for breathing problems. Follow instructions from your health care provider about wearing your sleep device: ? Anytime you are sleeping, including during daytime naps. ? While taking prescription pain medicines, sleeping medicines, or medicines that make you drowsy.  Have a responsible adult stay with you for the time you are told. It is important to have someone help care for you until you are awake and alert.  Return to your normal activities as told by your health care provider. Ask your health care provider what activities are safe for you.  Take over-the-counter and prescription medicines only as told by your health care provider.  If you smoke, do not smoke without supervision.  Keep all follow-up visits as told by your health care provider. This is important. Contact a health care provider if:  You have nausea or vomiting that does not get better with medicine.  You cannot eat or drink without vomiting.  You have pain that does not get better with medicine.  You are unable to pass urine.  You develop a skin rash.  You have a fever.  You  have redness around your IV site that gets worse. Get help right away if:  You have difficulty breathing.  You have chest pain.  You have blood in your urine or stool, or you  vomit blood. Summary  After the procedure, it is common to have a sore throat or nausea. It is also common to feel tired.  Have a responsible adult stay with you for the time you are told. It is important to have someone help care for you until you are awake and alert.  When you feel hungry, start by eating small amounts of foods that are soft and easy to digest (bland), such as toast. Gradually return to your regular diet.  Drink enough fluid to keep your urine pale yellow.  Return to your normal activities as told by your health care provider. Ask your health care provider what activities are safe for you. This information is not intended to replace advice given to you by your health care provider. Make sure you discuss any questions you have with your health care provider. Document Revised: 05/05/2020 Document Reviewed: 12/03/2019 Elsevier Patient Education  2021 [employer].

## 2020-09-26 ENCOUNTER — Telehealth (INDEPENDENT_AMBULATORY_CARE_PROVIDER_SITE_OTHER): Payer: Medicare Other | Admitting: "Endocrinology

## 2020-09-26 ENCOUNTER — Telehealth (INDEPENDENT_AMBULATORY_CARE_PROVIDER_SITE_OTHER): Payer: Medicare Other | Admitting: General Surgery

## 2020-09-26 ENCOUNTER — Encounter (HOSPITAL_COMMUNITY)
Admission: RE | Admit: 2020-09-26 | Discharge: 2020-09-26 | Disposition: A | Discharge status: Home / Self Care | Payer: Medicare Other | Source: Ambulatory Visit | Attending: General Surgery | Admitting: General Surgery

## 2020-09-26 ENCOUNTER — Encounter: Payer: Self-pay | Admitting: "Endocrinology

## 2020-09-26 ENCOUNTER — Other Ambulatory Visit (HOSPITAL_COMMUNITY)
Admission: RE | Admit: 2020-09-26 | Discharge: 2020-09-26 | Disposition: A | Discharge status: Home / Self Care | Payer: Medicare Other | Source: Ambulatory Visit | Attending: General Surgery | Admitting: General Surgery

## 2020-09-26 ENCOUNTER — Other Ambulatory Visit: Payer: Self-pay

## 2020-09-26 VITALS — Ht 70.5 in | Wt 198.0 lb

## 2020-09-26 DIAGNOSIS — C180 Malignant neoplasm of cecum: Secondary | ICD-10-CM

## 2020-09-26 DIAGNOSIS — Z20822 Contact with and (suspected) exposure to covid-19: Secondary | ICD-10-CM | POA: Insufficient documentation

## 2020-09-26 DIAGNOSIS — Z01812 Encounter for preprocedural laboratory examination: Secondary | ICD-10-CM | POA: Insufficient documentation

## 2020-09-26 DIAGNOSIS — E042 Nontoxic multinodular goiter: Secondary | ICD-10-CM | POA: Diagnosis not present

## 2020-09-26 DIAGNOSIS — E041 Nontoxic single thyroid nodule: Secondary | ICD-10-CM

## 2020-09-26 LAB — CBC WITH DIFFERENTIAL/PLATELET
Abs Immature Granulocytes: 0.01 10*3/uL (ref 0.00–0.07)
Basophils Absolute: 0 10*3/uL (ref 0.0–0.1)
Basophils Relative: 0 %
Eosinophils Absolute: 0.1 10*3/uL (ref 0.0–0.5)
Eosinophils Relative: 2 %
HCT: 32.9 % — ABNORMAL LOW (ref 39.0–52.0)
Hemoglobin: 10.4 g/dL — ABNORMAL LOW (ref 13.0–17.0)
Immature Granulocytes: 0 %
Lymphocytes Relative: 23 %
Lymphs Abs: 1.5 10*3/uL (ref 0.7–4.0)
MCH: 29.1 pg (ref 26.0–34.0)
MCHC: 31.6 g/dL (ref 30.0–36.0)
MCV: 92.2 fL (ref 80.0–100.0)
Monocytes Absolute: 0.9 10*3/uL (ref 0.1–1.0)
Monocytes Relative: 14 %
Neutro Abs: 4.2 10*3/uL (ref 1.7–7.7)
Neutrophils Relative %: 61 %
Platelets: 209 10*3/uL (ref 150–400)
RBC: 3.57 MIL/uL — ABNORMAL LOW (ref 4.22–5.81)
RDW: 14.5 % (ref 11.5–15.5)
WBC: 6.8 10*3/uL (ref 4.0–10.5)
nRBC: 0 % (ref 0.0–0.2)

## 2020-09-26 LAB — BASIC METABOLIC PANEL
Anion gap: 7 (ref 5–15)
BUN: 17 mg/dL (ref 8–23)
CO2: 25 mmol/L (ref 22–32)
Calcium: 8.4 mg/dL — ABNORMAL LOW (ref 8.9–10.3)
Chloride: 106 mmol/L (ref 98–111)
Creatinine, Ser: 0.73 mg/dL (ref 0.61–1.24)
GFR, Estimated: >60 mL/min (ref >60)
Glucose, Bld: 115 mg/dL — ABNORMAL HIGH (ref 70–99)
Potassium: 4.1 mmol/L (ref 3.5–5.1)
Sodium: 138 mmol/L (ref 135–145)

## 2020-09-26 LAB — PREPARE RBC (CROSSMATCH)

## 2020-09-26 NOTE — Telephone Encounter (Signed)
[facility]  Called patient and POA Izora Gala. No compressive symptoms like dysphagia or choking or fullness in the neck. He does have some sleep apnea and uses his sleep machine.  Airway patent on the CT chest.   Will proceed with right colectomy and plan for thyroid surgery in a few weeks.  Curlene Labrum, MD [facility] [address] [place], [place] [address] [phone] (office)

## 2020-09-26 NOTE — Progress Notes (Signed)
09/26/2020, 11:16 AM                                 Endocrinology Telehealth Visit Follow up Note -During COVID -19 Pandemic  This visit type was conducted  via telephone due to national recommendations for restrictions regarding the COVID-19 Pandemic  in an effort to limit this patient's exposure and mitigate transmission of the corona virus.   I connected with Jerry Macdonald on 09/26/2020   by telephone and verified that I am speaking with the correct person using two identifiers. Jerry Macdonald, Nov 02, 1936. he has verbally consented to this visit.  I was in my office and patient was in his residence. No other persons were with me during the encounter. All issues noted in this document were discussed and addressed. The format was not optimal for physical exam.   Subjective:    Patient ID: Jerry Macdonald, male    DOB: 01/06/1937, PCP [name], MD   Past Medical History:  Diagnosis Date  . Abnormal prostate biopsy   . Back pain    with radiculopathy  . Cataracts, bilateral   . Constipation   . HOH (hard of hearing)   . Hyperlipidemia   . Hypertrophy of prostate   . Malaise and fatigue   . Obesity   . Pyogenic granuloma    left cheeck  . S/P colonoscopy June 2011   rectal bleeding secondary to hemorrhoids  . S/P colonoscopy 2007   [facility], diverticulosis per Dr. Juel Burrow note  . S/P endoscopy June 2011   Barrett's, mild gastritis and duodenitis  . Seborrheic keratosis   . Sleep apnea    Past Surgical History:  Procedure Laterality Date  . BIOPSY  03/27/2019   Procedure: BIOPSY;  Surgeon: Danie Binder, MD;  [facility];  Service: Endoscopy;;  . BIOPSY  08/16/2020   Procedure: BIOPSY;  Surgeon: Eloise Harman, DO;  [facility];  Service: Endoscopy;;  . COLONOSCOPY  JUN 2011   PAN-COLONIC DIVERTICULOSIS  . COLONOSCOPY  2007   [facility]  . COLONOSCOPY WITH PROPOFOL N/A 08/16/2020    Procedure: COLONOSCOPY WITH PROPOFOL;  Surgeon: Eloise Harman, DO;  [facility];  Service: Endoscopy;  Laterality: N/A;  10:45am  . ESOPHAGOGASTRODUODENOSCOPY  06/11/2011   Procedure: ESOPHAGOGASTRODUODENOSCOPY (EGD);  Surgeon: Dorothyann Peng, MD;  [facility];  Service: Endoscopy;  Laterality: N/A;  11:00  . ESOPHAGOGASTRODUODENOSCOPY N/A 05/21/2014   Dr. Fields:Barrett's esophagus/ Medium sized hiatal hernia. path with indefinite dysplasia.   Marland Kitchen ESOPHAGOGASTRODUODENOSCOPY N/A 12/20/2014   Dr. Clayburn Pert gastric polyp/large HH/6 cm segment of Barrett's esophagus, path with hyperplastic gastric polyp, no dysplasia. Surveillance April 2017 due   . ESOPHAGOGASTRODUODENOSCOPY N/A 01/20/2016   Procedure: ESOPHAGOGASTRODUODENOSCOPY (EGD);  Surgeon: Danie Binder, MD;  [facility];  Service: Endoscopy;  Laterality: N/A;  1230pm  . ESOPHAGOGASTRODUODENOSCOPY N/A 03/27/2019   Procedure: ESOPHAGOGASTRODUODENOSCOPY (EGD);  Surgeon: Danie Binder, MD;  [facility];  Service: Endoscopy;  Laterality: N/A;  12:15pm  . HERNIA REPAIR    . UMBILICAL HERNIA REPAIR    .  VARICOSE VEIN SURGERY     Social History   Socioeconomic History  . Marital status: Divorced    Spouse name: Not on file  . Number of children: Not on file  . Years of education: Not on file  . Highest education level: Not on file  Occupational History  . Not on file  Tobacco Use  . Smoking status: Former Smoker    Packs/day: 0.50    Years: 2.00    Pack years: 1.00    Types: Cigarettes    Quit date: 09/10/1963    Years since quitting: 57.0  . Smokeless tobacco: Never Used  . Tobacco comment: quit in 1965  Vaping Use  . Vaping Use: Former  Substance and Sexual Activity  . Alcohol use: Not Currently  . Drug use: No  . Sexual activity: Not Currently  Other Topics Concern  . Not on file  Social History Narrative  . Not on file   Social Determinants of Health   Financial  Resource Strain: Not on file  Food Insecurity: Not on file  Transportation Needs: No Transportation Needs  . Lack of Transportation (Medical): No  . Lack of Transportation (Non-Medical): No  Physical Activity: Inactive  . Days of Exercise per Week: 0 days  . Minutes of Exercise per Session: 0 min  Stress: Not on file  Social Connections: Not on file   Family History  Problem Relation Age of Onset  . Cancer Father   . Colon polyps Neg Hx   . Colon cancer Neg Hx    Outpatient Encounter Medications as of 09/26/2020  Medication Sig  . acetaminophen (TYLENOL) 500 MG tablet Take 500 mg by mouth every 8 (eight) hours as needed for moderate pain.  Marland Kitchen CALCIUM PO Take 600 mg by mouth daily. With magnesium  . finasteride (PROSCAR) 5 MG tablet Take 1 tablet (5 mg total) by mouth daily.  Marland Kitchen GAVILYTE-G 236 g solution SMARTSIG:Milliliter(s) By Mouth  . magnesium oxide (MAG-OX) 400 MG tablet Take 400 mg by mouth 2 (two) times daily.  . metroNIDAZOLE (FLAGYL) 500 MG tablet Take 1 tablet (500 mg total) by mouth as directed. The day before surgery take 2 flagyl 520m tablets at 2 pm, 3pm,10pm  . Multiple Vitamins-Minerals (MULTIVITAMINS THER. W/MINERALS) TABS Take 1 tablet by mouth daily.  .Marland Kitchenneomycin (MYCIFRADIN) 500 MG tablet Take 2 tablets (1,000 mg total) by mouth as directed for 1 dose. The day before surgery take 2 neomycin 502mtablets at 2 pm, 3pm,10pm  . pantoprazole (PROTONIX) 40 MG tablet TAKE 1 TABLET TWICE A DAY BEFORE MEALS (Patient taking differently: Take 40 mg by mouth daily.)  . tamsulosin (FLOMAX) 0.4 MG CAPS capsule Take 1 capsule (0.4 mg total) by mouth daily.   No facility-administered encounter medications on file as of 09/26/2020.   ALLERGIES: Allergies  Allergen Reactions  . Penicillins     Swelling of hands and feet - severe Did it involve swelling of the face/tongue/throat, SOB, or low BP? No Did it involve sudden or severe rash/hives, skin peeling, or any reaction on the  inside of your mouth or nose? No Did you need to seek medical attention at a hospital or doctor's office? No When did it last happen?1950s If all above answers are "NO", may proceed with cephalosporin use.     VACCINATION STATUS: Immunization History  Administered Date(s) Administered  . DTaP 06/03/2013  . H1N1 08/12/2008  . Influenza Whole 07/07/2007, 05/20/2008  . Influenza-Unspecified 06/03/2013, 06/03/2018  . Pneumococcal-Unspecified 06/03/2013,  06/03/2014  . Varicella 06/03/2013    HPI ELLARD NAN is 87 y.o. male who is being engaged in telehealth via telephone for follow-up of multinodular goiter.  He was assisted by Kathee Polite his friend and power of attorney during this telephone visit.  PMD:  [name], MD.   He is not an optimal historian.  History is obtained mainly from chart review. he has been dealing with symptoms of fatigue, positional shortness of breath, sleep apnea for several years. He denies any significant weight change. He was recently found to have large multinodular goiter on thyroid ultrasound.  He underwent fine-needle aspiration of 5.3 cm nodule on the right lobe which is benign, biopsy results of 1.5 cm left lobe nodule still pending.    He is also recently diagnosed with cecal/colon cancer, scheduled to have surgery on September 28, 2020.  This surgery is scheduled to be performed laparoscopically. He is not on any thyroid hormone or antithyroid interventions.  His recent thyroid function test where consistent with  suppressed TSH and low normal T4 of 5.9, normal T3 of 98. He has minimally fluctuating body weight.  He denies palpitations, tremors, nor heat/cold intolerance.  He is a former smoker, denies any exposure to neck radiations. His other medical problems include BPH/prostate cancer, Barrett's esophagus, sleep apnea, and recently diagnosed cecal/colon cancer. He is cared for by an elderly friend Dola Argyle is also his power of  attorney.   Review of Systems  Limited as above.  Objective:    Vitals with BMI 09/26/2020 09/22/2020 09/21/2020  Height 5' 10.5" 5' 10.5" -  Weight 198 lbs 198 lbs 3 oz -  BMI 28 32.35 -  Systolic - 573 220  Diastolic - 64 70  Pulse - 72 64    Ht 5' 10.5" (1.791 m)   Wt 198 lb (89.8 kg)   BMI 28.01 kg/m   Wt Readings from Last 3 Encounters:  09/26/20 198 lb (89.8 kg)  09/22/20 198 lb 3.2 oz (89.9 kg)  09/09/20 199 lb 4.7 oz (90.4 kg)    Physical Exam    CMP ( most recent) CMP     Component Value Date/Time   NA 140 09/06/2020 1525   K 4.6 09/06/2020 1525   CL 108 09/06/2020 1525   CO2 25 09/06/2020 1525   GLUCOSE 109 (H) 09/06/2020 1525   BUN 19 09/06/2020 1525   CREATININE 0.70 09/06/2020 1525   CALCIUM 8.8 (L) 09/06/2020 1525   PROT 6.7 09/06/2020 1525   ALBUMIN 3.7 09/06/2020 1525   AST 18 09/06/2020 1525   ALT 13 09/06/2020 1525   ALKPHOS 48 09/06/2020 1525   BILITOT 0.8 09/06/2020 1525   GFRNONAA >60 09/06/2020 1525   GFRAA  02/03/2010 1249    >60        The eGFR has been calculated using the MDRD equation. This calculation has not been validated in all clinical situations. eGFR's persistently <60 mL/min signify possible Chronic Kidney Disease.     Lipid Panel ( most recent) Lipid Panel     Component Value Date/Time   CHOL 139 08/12/2008 2244   TRIG 88 08/12/2008 2244   HDL 39 (L) 08/12/2008 2244   CHOLHDL 3.6 Ratio 08/12/2008 2244   VLDL 18 08/12/2008 2244   LDLCALC 82 08/12/2008 2244      Lab Results  Component Value Date   TSH 0.075 (L) 09/06/2020   TSH 0.772 08/09/2007    September 14, 2020 thyroid ultrasound: Right lobe  measures 13.8 x 6.7 x 6.4 cm with large 5.3 cm nodule which was biopsied and findings were benign. Left lobe measured 9.9 x 4.9 x 3.5 cm with 2 nodules measuring 4.8 cm Pseudonodule not biopsied and 1.5 cm-suspicious biopsied.  Fine-needle aspiration biopsy results of only thyroid nodule is available today  showing benign follicular nodule.    Assessment & Plan:   1. Multinodular goiter  -His biopsy results are only partially available to review today.  The right sided nodule is consistent with benign follicular nodule, left-sided nodule results are pending.  He will be contacted with the results of his left lobe nodule  fine-needle aspiration biopsy. -Regardless of biopsy results, this patient will benefit from definitive treatment involving total thyroidectomy. -However, he is scheduled for laparoscopic colon surgery for cecal/colon cancer on September 28, 2020.  If this large multinodular goiter does not interfere with his intubation, his thyroid surgery may follow in 4 to 5 weeks after his colon surgery. -His recent thyroid function test do not suggest primary hyperthyroidism given normal T4 and T3.  -His recent TSH was suppressed, however he does not warrant antithyroid intervention at this time. He will have thyroid function test before his next visit. -He is made aware of the fact that he will need thyroid hormone supplement if he ends up undergoing total thyroidectomy which will be a choice of surgery for his thyroid given large multinodular bilateral goiter.    - I did not initiate any new prescriptions today.    - he is advised to maintain close follow up with [name], MD for primary care needs.  I encouraged him to keep close follow-up with Dr. Blake Divine for his planned surgeries.      - Time spent on this patient care encounter:  20 minutes of which 50% was spent in  counseling and the rest reviewing  his current and  previous labs / studies and medications  doses and developing a plan for long term care. Jerry Macdonald  participated in the discussions, expressed understanding, and voiced agreement with the above plans.  All questions were answered to his satisfaction. he is encouraged to contact clinic should he have any questions or concerns prior to his return  visit.   Follow up plan: Return in about 5 weeks (around 10/31/2020) for F/U with Pre-visit Labs.   Glade Lloyd, MD [facility] [address] [place], [place] [address] Phone: [phone]  Fax: [phone]     09/26/2020, 11:16 AM  This note was partially dictated with voice recognition software. Similar sounding words can be transcribed inadequately or may not  be corrected upon review.

## 2020-09-27 LAB — CYTOLOGY - NON PAP

## 2020-09-27 LAB — SARS CORONAVIRUS 2 (TAT 6-24 HRS): SARS Coronavirus 2: NEGATIVE

## 2020-09-28 ENCOUNTER — Inpatient Hospital Stay (HOSPITAL_COMMUNITY): Payer: Medicare Other | Admitting: Anesthesiology

## 2020-09-28 ENCOUNTER — Encounter (HOSPITAL_COMMUNITY)
Admission: RE | Disposition: A | Discharge status: Home / Self Care | Payer: Self-pay | Source: Home / Self Care | Attending: General Surgery

## 2020-09-28 ENCOUNTER — Inpatient Hospital Stay (HOSPITAL_COMMUNITY)
Admission: RE | Admit: 2020-09-28 | Discharge: 2020-10-02 | DRG: 331 | Disposition: A | Discharge status: Home / Self Care | Payer: Medicare Other | Attending: General Surgery | Admitting: General Surgery

## 2020-09-28 ENCOUNTER — Encounter (HOSPITAL_COMMUNITY): Payer: Self-pay | Admitting: General Surgery

## 2020-09-28 ENCOUNTER — Other Ambulatory Visit: Payer: Self-pay

## 2020-09-28 DIAGNOSIS — C180 Malignant neoplasm of cecum: Secondary | ICD-10-CM | POA: Diagnosis not present

## 2020-09-28 DIAGNOSIS — C61 Malignant neoplasm of prostate: Secondary | ICD-10-CM | POA: Diagnosis not present

## 2020-09-28 DIAGNOSIS — Z87891 Personal history of nicotine dependence: Secondary | ICD-10-CM | POA: Diagnosis not present

## 2020-09-28 DIAGNOSIS — M5410 Radiculopathy, site unspecified: Secondary | ICD-10-CM | POA: Diagnosis present

## 2020-09-28 DIAGNOSIS — Z6828 Body mass index (BMI) 28.0-28.9, adult: Secondary | ICD-10-CM

## 2020-09-28 DIAGNOSIS — G4730 Sleep apnea, unspecified: Secondary | ICD-10-CM | POA: Diagnosis not present

## 2020-09-28 DIAGNOSIS — E042 Nontoxic multinodular goiter: Secondary | ICD-10-CM | POA: Diagnosis not present

## 2020-09-28 DIAGNOSIS — Z20822 Contact with and (suspected) exposure to covid-19: Secondary | ICD-10-CM | POA: Diagnosis present

## 2020-09-28 DIAGNOSIS — E785 Hyperlipidemia, unspecified: Secondary | ICD-10-CM | POA: Diagnosis present

## 2020-09-28 DIAGNOSIS — E669 Obesity, unspecified: Secondary | ICD-10-CM | POA: Diagnosis not present

## 2020-09-28 DIAGNOSIS — K4090 Unilateral inguinal hernia, without obstruction or gangrene, not specified as recurrent: Secondary | ICD-10-CM | POA: Diagnosis not present

## 2020-09-28 DIAGNOSIS — K388 Other specified diseases of appendix: Secondary | ICD-10-CM | POA: Diagnosis not present

## 2020-09-28 DIAGNOSIS — K5909 Other constipation: Secondary | ICD-10-CM | POA: Diagnosis present

## 2020-09-28 HISTORY — PX: LAPAROSCOPIC PARTIAL COLECTOMY: SHX5907

## 2020-09-28 SURGERY — LAPAROSCOPIC PARTIAL COLECTOMY
Anesthesia: General | Site: Abdomen | Laterality: Right

## 2020-09-28 MED ORDER — ZOLPIDEM TARTRATE 5 MG PO TABS: 5.0000 mg | ORAL_TABLET | Freq: Every evening | ORAL | Status: DC | PRN

## 2020-09-28 MED ORDER — METOPROLOL TARTRATE 5 MG/5ML IV SOLN: 5.0000 mg | Freq: Four times a day (QID) | INTRAVENOUS | Status: DC | PRN

## 2020-09-28 MED ORDER — ALUM & MAG HYDROXIDE-SIMETH 200-200-20 MG/5ML PO SUSP: 30.0000 mL | Freq: Four times a day (QID) | ORAL | Status: DC | PRN

## 2020-09-28 MED ORDER — ROCURONIUM BROMIDE 10 MG/ML (PF) SYRINGE
PREFILLED_SYRINGE | INTRAVENOUS | Status: AC
  Filled 2020-09-28: qty 10

## 2020-09-28 MED ORDER — BUPIVACAINE LIPOSOME 1.3 % IJ SUSP
INTRAMUSCULAR | Status: AC
  Filled 2020-09-28: qty 20

## 2020-09-28 MED ORDER — FENTANYL CITRATE (PF) 100 MCG/2ML IJ SOLN
25.0000 ug | INTRAMUSCULAR | Status: DC | PRN
  Administered 2020-09-28 (×2): 50 ug via INTRAVENOUS

## 2020-09-28 MED ORDER — PANTOPRAZOLE SODIUM 40 MG PO TBEC
40.0000 mg | DELAYED_RELEASE_TABLET | Freq: Every day | ORAL | Status: DC
  Administered 2020-09-29 – 2020-10-02 (×4): 40 mg via ORAL
  Filled 2020-09-28 (×5): qty 1

## 2020-09-28 MED ORDER — LIDOCAINE HCL (CARDIAC) PF 100 MG/5ML IV SOSY
PREFILLED_SYRINGE | INTRAVENOUS | Status: DC | PRN
  Administered 2020-09-28: 100 mg via INTRATRACHEAL

## 2020-09-28 MED ORDER — DIPHENHYDRAMINE HCL 50 MG/ML IJ SOLN: 12.5000 mg | Freq: Four times a day (QID) | INTRAMUSCULAR | Status: DC | PRN

## 2020-09-28 MED ORDER — ALVIMOPAN 12 MG PO CAPS
12.0000 mg | ORAL_CAPSULE | Freq: Two times a day (BID) | ORAL | Status: DC
  Administered 2020-09-29 – 2020-09-30 (×3): 12 mg via ORAL
  Filled 2020-09-28 (×5): qty 1

## 2020-09-28 MED ORDER — ALVIMOPAN 12 MG PO CAPS
12.0000 mg | ORAL_CAPSULE | ORAL | Status: AC
  Administered 2020-09-28: 12 mg via ORAL

## 2020-09-28 MED ORDER — PHENYLEPHRINE HCL (PRESSORS) 10 MG/ML IV SOLN
INTRAVENOUS | Status: DC | PRN
  Administered 2020-09-28: 80 ug via INTRAVENOUS
  Administered 2020-09-28 (×2): 120 ug via INTRAVENOUS
  Administered 2020-09-28: 80 ug via INTRAVENOUS
  Administered 2020-09-28 (×2): 120 ug via INTRAVENOUS
  Administered 2020-09-28 (×2): 80 ug via INTRAVENOUS

## 2020-09-28 MED ORDER — CHLORHEXIDINE GLUCONATE CLOTH 2 % EX PADS: 6.0000 | MEDICATED_PAD | Freq: Once | CUTANEOUS | Status: DC

## 2020-09-28 MED ORDER — FENTANYL CITRATE (PF) 100 MCG/2ML IJ SOLN
INTRAMUSCULAR | Status: AC
  Filled 2020-09-28: qty 2

## 2020-09-28 MED ORDER — KETOROLAC TROMETHAMINE 15 MG/ML IJ SOLN: 15.0000 mg | Freq: Four times a day (QID) | INTRAMUSCULAR | Status: DC | PRN

## 2020-09-28 MED ORDER — ACETAMINOPHEN 500 MG PO TABS
1000.0000 mg | ORAL_TABLET | ORAL | Status: AC
  Administered 2020-09-28: 1000 mg via ORAL

## 2020-09-28 MED ORDER — ONDANSETRON HCL 4 MG/2ML IJ SOLN: 4.0000 mg | Freq: Four times a day (QID) | INTRAMUSCULAR | Status: DC | PRN

## 2020-09-28 MED ORDER — PROCHLORPERAZINE MALEATE 5 MG PO TABS: 10.0000 mg | ORAL_TABLET | Freq: Four times a day (QID) | ORAL | Status: DC | PRN

## 2020-09-28 MED ORDER — LACTATED RINGERS IV SOLN: INTRAVENOUS | Status: DC

## 2020-09-28 MED ORDER — CHLORHEXIDINE GLUCONATE 0.12 % MT SOLN
15.0000 mL | Freq: Once | OROMUCOSAL | Status: AC
  Administered 2020-09-28: 15 mL via OROMUCOSAL

## 2020-09-28 MED ORDER — OXYCODONE HCL 5 MG PO TABS
5.0000 mg | ORAL_TABLET | ORAL | Status: DC | PRN
  Administered 2020-09-28: 10 mg via ORAL
  Filled 2020-09-28: qty 2

## 2020-09-28 MED ORDER — ACETAMINOPHEN 500 MG PO TABS
ORAL_TABLET | ORAL | Status: AC
  Filled 2020-09-28: qty 2

## 2020-09-28 MED ORDER — SIMETHICONE 80 MG PO CHEW: 40.0000 mg | CHEWABLE_TABLET | Freq: Four times a day (QID) | ORAL | Status: DC | PRN

## 2020-09-28 MED ORDER — PROPOFOL 10 MG/ML IV BOLUS
INTRAVENOUS | Status: DC | PRN
  Administered 2020-09-28: 150 mg via INTRAVENOUS

## 2020-09-28 MED ORDER — HEPARIN SODIUM (PORCINE) 5000 UNIT/ML IJ SOLN
5000.0000 [IU] | Freq: Three times a day (TID) | INTRAMUSCULAR | Status: DC
  Administered 2020-09-28 – 2020-10-02 (×11): 5000 [IU] via SUBCUTANEOUS
  Filled 2020-09-28 (×11): qty 1

## 2020-09-28 MED ORDER — DEXAMETHASONE SODIUM PHOSPHATE 10 MG/ML IJ SOLN
INTRAMUSCULAR | Status: AC
  Filled 2020-09-28: qty 1

## 2020-09-28 MED ORDER — ROCURONIUM BROMIDE 10 MG/ML (PF) SYRINGE
PREFILLED_SYRINGE | INTRAVENOUS | Status: DC | PRN
  Administered 2020-09-28: 20 mg via INTRAVENOUS
  Administered 2020-09-28: 50 mg via INTRAVENOUS

## 2020-09-28 MED ORDER — 0.9 % SODIUM CHLORIDE (POUR BTL) OPTIME
TOPICAL | Status: DC | PRN
  Administered 2020-09-28: 3000 mL

## 2020-09-28 MED ORDER — MORPHINE SULFATE (PF) 2 MG/ML IV SOLN: 2.0000 mg | INTRAVENOUS | Status: DC | PRN

## 2020-09-28 MED ORDER — ONDANSETRON HCL 4 MG/2ML IJ SOLN
INTRAMUSCULAR | Status: AC
  Filled 2020-09-28: qty 2

## 2020-09-28 MED ORDER — FINASTERIDE 5 MG PO TABS
5.0000 mg | ORAL_TABLET | Freq: Every day | ORAL | Status: DC
  Administered 2020-09-28 – 2020-10-02 (×5): 5 mg via ORAL
  Filled 2020-09-28 (×5): qty 1

## 2020-09-28 MED ORDER — ORAL CARE MOUTH RINSE: 15.0000 mL | Freq: Once | OROMUCOSAL | Status: AC

## 2020-09-28 MED ORDER — FENTANYL CITRATE (PF) 250 MCG/5ML IJ SOLN
INTRAMUSCULAR | Status: AC
  Filled 2020-09-28: qty 5

## 2020-09-28 MED ORDER — ALVIMOPAN 12 MG PO CAPS
ORAL_CAPSULE | ORAL | Status: AC
  Filled 2020-09-28: qty 1

## 2020-09-28 MED ORDER — CLINDAMYCIN PHOSPHATE 900 MG/50ML IV SOLN
900.0000 mg | INTRAVENOUS | Status: AC
  Administered 2020-09-28: 900 mg via INTRAVENOUS
  Filled 2020-09-28: qty 50

## 2020-09-28 MED ORDER — ONDANSETRON HCL 4 MG PO TABS: 4.0000 mg | ORAL_TABLET | Freq: Four times a day (QID) | ORAL | Status: DC | PRN

## 2020-09-28 MED ORDER — FENTANYL CITRATE (PF) 100 MCG/2ML IJ SOLN
INTRAMUSCULAR | Status: DC | PRN
  Administered 2020-09-28 (×2): 50 ug via INTRAVENOUS
  Administered 2020-09-28: 100 ug via INTRAVENOUS

## 2020-09-28 MED ORDER — EPHEDRINE SULFATE 50 MG/ML IJ SOLN
INTRAMUSCULAR | Status: DC | PRN
  Administered 2020-09-28: 10 mg via INTRAVENOUS

## 2020-09-28 MED ORDER — DEXAMETHASONE SODIUM PHOSPHATE 10 MG/ML IJ SOLN
INTRAMUSCULAR | Status: DC | PRN
  Administered 2020-09-28: 10 mg via INTRAVENOUS

## 2020-09-28 MED ORDER — TAMSULOSIN HCL 0.4 MG PO CAPS
0.4000 mg | ORAL_CAPSULE | Freq: Every day | ORAL | Status: DC
  Administered 2020-09-28 – 2020-10-02 (×5): 0.4 mg via ORAL
  Filled 2020-09-28 (×5): qty 1

## 2020-09-28 MED ORDER — GENTAMICIN SULFATE 40 MG/ML IJ SOLN
5.0000 mg/kg | INTRAVENOUS | Status: AC
  Administered 2020-09-28: 470 mg via INTRAVENOUS
  Filled 2020-09-28: qty 11.75

## 2020-09-28 MED ORDER — DIPHENHYDRAMINE HCL 12.5 MG/5ML PO ELIX: 12.5000 mg | ORAL_SOLUTION | Freq: Four times a day (QID) | ORAL | Status: DC | PRN

## 2020-09-28 MED ORDER — ONDANSETRON HCL 4 MG/2ML IJ SOLN
INTRAMUSCULAR | Status: DC | PRN
  Administered 2020-09-28 (×2): 4 mg via INTRAVENOUS

## 2020-09-28 MED ORDER — SUGAMMADEX SODIUM 500 MG/5ML IV SOLN
INTRAVENOUS | Status: DC | PRN
  Administered 2020-09-28: 200 mg via INTRAVENOUS

## 2020-09-28 MED ORDER — FENTANYL CITRATE (PF) 100 MCG/2ML IJ SOLN: 25.0000 ug | INTRAMUSCULAR | Status: DC | PRN

## 2020-09-28 MED ORDER — HEPARIN SODIUM (PORCINE) 5000 UNIT/ML IJ SOLN
5000.0000 [IU] | Freq: Once | INTRAMUSCULAR | Status: AC
  Administered 2020-09-28: 5000 [IU] via SUBCUTANEOUS
  Filled 2020-09-28: qty 1

## 2020-09-28 MED ORDER — ACETAMINOPHEN 500 MG PO TABS
1000.0000 mg | ORAL_TABLET | Freq: Four times a day (QID) | ORAL | Status: DC
  Administered 2020-09-28 – 2020-10-02 (×12): 1000 mg via ORAL
  Filled 2020-09-28 (×12): qty 2

## 2020-09-28 MED ORDER — LIDOCAINE HCL (PF) 2 % IJ SOLN
INTRAMUSCULAR | Status: AC
  Filled 2020-09-28: qty 5

## 2020-09-28 MED ORDER — GABAPENTIN 300 MG PO CAPS
300.0000 mg | ORAL_CAPSULE | Freq: Two times a day (BID) | ORAL | Status: DC
  Administered 2020-09-28 – 2020-10-02 (×8): 300 mg via ORAL
  Filled 2020-09-28 (×6): qty 1
  Filled 2020-09-28: qty 3
  Filled 2020-09-28 (×2): qty 1

## 2020-09-28 MED ORDER — ONDANSETRON HCL 4 MG/2ML IJ SOLN: 4.0000 mg | Freq: Once | INTRAMUSCULAR | Status: DC | PRN

## 2020-09-28 MED ORDER — PROCHLORPERAZINE EDISYLATE 10 MG/2ML IJ SOLN: 5.0000 mg | Freq: Four times a day (QID) | INTRAMUSCULAR | Status: DC | PRN

## 2020-09-28 MED ORDER — BUPIVACAINE LIPOSOME 1.3 % IJ SUSP
INTRAMUSCULAR | Status: DC | PRN
  Administered 2020-09-28: 20 mL

## 2020-09-28 MED ORDER — ENSURE SURGERY PO LIQD
237.0000 mL | Freq: Two times a day (BID) | ORAL | Status: DC
  Filled 2020-09-28 (×10): qty 237

## 2020-09-28 SURGICAL SUPPLY — 63 items
BAG HAMPER (MISCELLANEOUS) ×2 IMPLANT
CELLS DAT CNTRL 66122 CELL SVR (MISCELLANEOUS) IMPLANT
COVER LIGHT HANDLE STERIS (MISCELLANEOUS) ×4 IMPLANT
COVER WAND RF STERILE (DRAPES) ×2 IMPLANT
DISSECTOR BLUNT TIP ENDO 5MM (MISCELLANEOUS) ×2 IMPLANT
DRSG OPSITE POSTOP 4X6 (GAUZE/BANDAGES/DRESSINGS) ×2 IMPLANT
DRSG OPSITE POSTOP 4X8 (GAUZE/BANDAGES/DRESSINGS) ×2 IMPLANT
DRSG TEGADERM 2-3/8X2-3/4 SM (GAUZE/BANDAGES/DRESSINGS) ×6 IMPLANT
ELECT REM PT RETURN 9FT ADLT (ELECTROSURGICAL) ×2
ELECTRODE REM PT RTRN 9FT ADLT (ELECTROSURGICAL) ×1 IMPLANT
GAUZE SPONGE 4X4 12PLY STRL LF (GAUZE/BANDAGES/DRESSINGS) ×2 IMPLANT
GLOVE BIO SURGEON STRL SZ 6.5 (GLOVE) ×4 IMPLANT
GLOVE BIO SURGEON STRL SZ7 (GLOVE) ×2 IMPLANT
GLOVE BIOGEL PI IND STRL 6.5 (GLOVE) ×2 IMPLANT
GLOVE BIOGEL PI IND STRL 7.0 (GLOVE) ×6 IMPLANT
GLOVE BIOGEL PI INDICATOR 6.5 (GLOVE) ×2
GLOVE BIOGEL PI INDICATOR 7.0 (GLOVE) ×6
GLOVE SURG SS PI 7.5 STRL IVOR (GLOVE) ×4 IMPLANT
GOWN STRL REUS W/ TWL LRG LVL3 (GOWN DISPOSABLE) ×1 IMPLANT
GOWN STRL REUS W/TWL LRG LVL3 (GOWN DISPOSABLE) ×18 IMPLANT
INST SET LAPROSCOPIC AP (KITS) ×2 IMPLANT
INST SET MAJOR GENERAL (KITS) ×2 IMPLANT
KIT TURNOVER CYSTO (KITS) ×2 IMPLANT
LIGASURE IMPACT 36 18CM CVD LR (INSTRUMENTS) ×2 IMPLANT
LIGASURE LAP ATLAS 10MM 37CM (INSTRUMENTS) ×2 IMPLANT
MANIFOLD NEPTUNE II (INSTRUMENTS) ×2 IMPLANT
NEEDLE HYPO 18GX1.5 BLUNT FILL (NEEDLE) ×2 IMPLANT
NEEDLE HYPO 21X1.5 SAFETY (NEEDLE) ×2 IMPLANT
NEEDLE INSUFFLATION 14GA 120MM (NEEDLE) ×2 IMPLANT
NS IRRIG 1000ML POUR BTL (IV SOLUTION) ×6 IMPLANT
PACK COLON (CUSTOM PROCEDURE TRAY) ×2 IMPLANT
PAD ARMBOARD 7.5X6 YLW CONV (MISCELLANEOUS) ×2 IMPLANT
PENCIL SMOKE EVACUATOR COATED (MISCELLANEOUS) ×2 IMPLANT
RELOAD LINEAR CUT PROX 55 BLUE (ENDOMECHANICALS) IMPLANT
RELOAD PROXIMATE 75MM BLUE (ENDOMECHANICALS) ×8 IMPLANT
RETRACTOR WND ALEXIS 25 LRG (MISCELLANEOUS) IMPLANT
RTRCTR WOUND ALEXIS 18CM MED (MISCELLANEOUS)
RTRCTR WOUND ALEXIS 25CM LRG (MISCELLANEOUS)
SET TUBE SMOKE EVAC HIGH FLOW (TUBING) ×2 IMPLANT
SHEARS HARMONIC ACE PLUS 36CM (ENDOMECHANICALS) ×2 IMPLANT
SHEET LAVH (DRAPES) IMPLANT
SLEEVE ENDOPATH XCEL 5M (ENDOMECHANICALS) ×2 IMPLANT
SPONGE GAUZE 2X2 8PLY STRL LF (GAUZE/BANDAGES/DRESSINGS) ×6 IMPLANT
SPONGE LAP 18X18 RF (DISPOSABLE) ×4 IMPLANT
STAPLER GUN LINEAR PROX 60 (STAPLE) ×2 IMPLANT
STAPLER PROXIMATE 55 BLUE (STAPLE) IMPLANT
STAPLER PROXIMATE 75MM BLUE (STAPLE) ×2 IMPLANT
STAPLER VISISTAT (STAPLE) ×2 IMPLANT
SUT CHROMIC 2 0 CT 1 (SUTURE) ×2 IMPLANT
SUT PDS AB CT VIOLET #0 27IN (SUTURE) ×4 IMPLANT
SUT PROLENE 2 0 SH 30 (SUTURE) IMPLANT
SUT SILK 2 0 SH (SUTURE) ×2 IMPLANT
SUT SILK 3 0 SH CR/8 (SUTURE) ×4 IMPLANT
SUT VIC AB 3-0 SH 27 (SUTURE) ×2
SUT VIC AB 3-0 SH 27X BRD (SUTURE) ×1 IMPLANT
SYR 10ML LL (SYRINGE) IMPLANT
SYR 20ML LL LF (SYRINGE) ×2 IMPLANT
TRAY FOLEY MTR SLVR 16FR STAT (SET/KITS/TRAYS/PACK) ×2 IMPLANT
TROCAR ENDO BLADELESS 11MM (ENDOMECHANICALS) ×2 IMPLANT
TROCAR XCEL NON-BLD 5MMX100MML (ENDOMECHANICALS) ×2 IMPLANT
TROCAR XCEL UNIV SLVE 11M 100M (ENDOMECHANICALS) ×2 IMPLANT
WARMER LAPAROSCOPE (MISCELLANEOUS) ×2 IMPLANT
YANKAUER SUCT BULB TIP 10FT TU (MISCELLANEOUS) ×4 IMPLANT

## 2020-09-28 NOTE — Progress Notes (Signed)
[facility]  Admission orders placed. Cardiac monitored bed. Scheduled tylenol, PRN narcotics. Clear diet, LR @ 50, foley overnight. Abdominal binder now.  Respiratory for IS and home CPAP.   Updated Izora Gala, POA.  Curlene Labrum, MD [facility] [address] [place], [place] [address] [phone] (office)

## 2020-09-28 NOTE — Anesthesia Postprocedure Evaluation (Deleted)
Anesthesia Post Note  Patient: Jerry Macdonald  Procedure(s) Performed: LAPAROSCOPIC RIGHT HEMI COLECTOMY(POSSIBLE OPEN) (Right )  Patient location during evaluation: PACU Anesthesia Type: General Level of consciousness: awake and alert, oriented and patient cooperative Pain management: pain level controlled Vital Signs Assessment: post-procedure vital signs reviewed and stable Respiratory status: spontaneous breathing and respiratory function stable Cardiovascular status: blood pressure returned to baseline and stable Postop Assessment: no apparent nausea or vomiting Anesthetic complications: no   No complications documented.   Last Vitals: There were no vitals filed for this visit.  Last Pain: There were no vitals filed for this visit.               Dagny Fiorentino

## 2020-09-28 NOTE — Anesthesia Preprocedure Evaluation (Signed)
Anesthesia Evaluation  Patient identified by MRN, date of birth, ID band Patient awake    Reviewed: Allergy & Precautions, H&P , NPO status , Patient's Chart, lab work & pertinent test results, reviewed documented beta blocker date and time   Airway Mallampati: II  TM Distance: >3 FB Neck ROM: full    Dental no notable dental hx.    Pulmonary sleep apnea , former smoker,    Pulmonary exam normal breath sounds clear to auscultation       Cardiovascular Exercise Tolerance: Good negative cardio ROS   Rhythm:regular Rate:Normal     Neuro/Psych negative neurological ROS  negative psych ROS   GI/Hepatic negative GI ROS, Neg liver ROS,   Endo/Other  negative endocrine ROS  Renal/GU negative Renal ROS  negative genitourinary   Musculoskeletal   Abdominal   Peds  Hematology negative hematology ROS (+)   Anesthesia Other Findings   Reproductive/Obstetrics negative OB ROS                             Anesthesia Physical Anesthesia Plan  ASA: III  Anesthesia Plan: General   Post-op Pain Management:    Induction:   PONV Risk Score and Plan: Ondansetron  Airway Management Planned:   Additional Equipment:   Intra-op Plan:   Post-operative Plan:   Informed Consent: I have reviewed the patients History and Physical, chart, labs and discussed the procedure including the risks, benefits and alternatives for the proposed anesthesia with the patient or authorized representative who has indicated his/her understanding and acceptance.     Dental Advisory Given  Plan Discussed with: CRNA  Anesthesia Plan Comments:         Anesthesia Quick Evaluation

## 2020-09-28 NOTE — Interval H&P Note (Signed)
History and Physical Interval Note:  09/28/2020 11:12 AM  Jerry Macdonald  has presented today for surgery, with the diagnosis of Cecal cancer.  The various methods of treatment have been discussed with the patient and family. After consideration of risks, benefits and other options for treatment, the patient has consented to  Procedure(s): LAPAROSCOPIC RIGHT HEMI COLECTOMY(POSSIBLE OPEN) (Right) as a surgical intervention.  The patient's history has been reviewed, patient examined, no change in status, stable for surgery.  I have reviewed the patient's chart and labs.  Questions were answered to the patient's satisfaction.    Bowel prep and antibiotic completed. No questions.  Virl Cagey

## 2020-09-28 NOTE — Anesthesia Postprocedure Evaluation (Signed)
Anesthesia Post Note  Patient: Jerry Macdonald  Procedure(s) Performed: LAPAROSCOPIC RIGHT HEMI COLECTOMY (Right Abdomen)  Patient location during evaluation: PACU Anesthesia Type: General Level of consciousness: awake and alert, oriented and patient cooperative Pain management: pain level controlled Respiratory status: spontaneous breathing and respiratory function stable Cardiovascular status: blood pressure returned to baseline and stable Postop Assessment: no apparent nausea or vomiting Anesthetic complications: no   No complications documented.   Last Vitals:  Vitals:   09/28/20 1105  BP: 139/80  Pulse: 77  Resp: 18  Temp: 36.9 C  SpO2: 99%    Last Pain:  Vitals:   09/28/20 1105  TempSrc: Oral  PainSc: 0-No pain                 Kegan Mckeithan

## 2020-09-28 NOTE — Op Note (Signed)
[facility] Operative Note  09/28/20  Preoperative Diagnosis:  Cecal cancer    Postoperative Diagnosis: Same   Procedure(s) Performed:  Laparoscopic right hemicolectomy (extracorporeal side to side anastomosis)    Surgeon: Lanell Matar. Constance Haw, MD   Assistants: Aviva Signs, MD     Anesthesia: General endotracheal   Anesthesiologist: Louann Sjogren, MD    Specimens:  Right colon and additional portion of transverse colon    Estimated Blood Loss: Minimal   Blood Replacement: None    Complications: None   Wound Class: Clean contaminated    Operative Indications:  [name] is a 87 yo who has a newly diagnosed cecal cancer and also a newly found thyroid multinodular goiter. He was worked up for the goiter and will likely need a thyroidectomy in the future. We discussed his cecal cancer and resection laparoscopically and the risk of bleeding, infection, anastomotic leak, injury to ureter, and needing further treatment.   Findings: Redundant cecum and transverse colon    Procedure: The patient was taken to the operating room and placed supine with the left arm tucked. General endotracheal anesthesia was induced. Intravenous antibiotics were administered per protocol.  An orogastric tube positioned to decompress the stomach The abdomen was prepared and draped in the usual sterile fashion.   An incision was made supraumbilical and a towel clip was used to elevate the abdominal wall. A Veress Needle was inserted and the saline drop test and low insufflation pressures confirmed intraperitoneal location.  The abdomen was insufflated and additional trocars were placed under direct visualization. A 12 mm left lower quadrant and a 5 mm lower abdominal trocar were placed in addition to a upper abdominal just left of midline 5 mm trocar for the assistant.    The patient was placed in the Trendelenburg position with the right side up and there were some minor attachments of  the cecum to the lateral abdominal wall that were taken down with a Ligasure which then failed to work correctly and was passed off the field. Then the white line of Toldt from the cecum to the ascending colon was taken down with a Harmonic scalpel.  The cecum was high riding and very redundant in nature.  The colon was swept lateral to medial as far up as the hepatic flexure, and then the patient was placed in the Reverse Trendelenburg position to facility mobilization of the hepatic flexure.  The space between the omentum and transverse colon was entered with the Harmonic scalpel, and this was carried laterally. Care was taken to identify and protect the duodenum.  The hepatic flexure and all attachments were taken down with the Harmonic scalpel and the transverse colon was swept inferiorly.  From here the only point where complete mobilization was not done was in the pelvis where the terminal ileum was attached.   Using care the peritoneum was scored and the attachments tethering the terminal ileum into the pelvis were taken down with the Harmonic Scalpel. The right ureter was identified and protected. The entire right colon and terminal ileum were completely mobilized to allow for extracorporal resection and anastomosis.   The supraumbilical incision was extended superiorly about 3cm and a wound protector was placed.  The terminal ileum and colon were eviscerated. The point of proximal transection on the terminal ileum was determined and this was taken with a 75 mm linear cutting stapler. The point on the proximal transverse colon was identified and taken with a second 75 mm linear cutting stapler.  The mesentery was taken with a Ligasure and care was taken to ensure to protect the duodenum and retroperitoneal structures and to have sufficient lymphatics around the ileocolonic vessels.   The colon edge was cleared from some epiploic fat and omentum and unfortunately there was a large diverticula on the  colon that was inadvertently entered. An additional 75 mm linear cutting stapler was used to take approximately 8cm more of transverse colon which was sent with the specimen.    A side to side anastomosis was performed in the standard fashion using a 75 mm linear cutting stapler and the common enterotomy was closed with a TA 60. The staple line was oversewn with 3-0 Interrupted Silk and 2 crotch sutures of 3-0 Silk were placed.  The mesenteric defect was very large and deep and omentum from the colon draped over it so it was not closed.   Dr. Arnoldo Morale was assisting throughout the procedure and was present for the critical portions of the case.   The entire team changed gowns and gloves and new closing equipment was used and drapes.  The midline was closed with a 0 PDS suture in the standard fashion. Exparel was injected. The skin on the port sites were closed with staples and the midline was closed with staples.  Sterile honeycomb dressing and 2X2 with Tegaderms were placed.   Final inspection revealed acceptable hemostasis. All counts were correct at the end of the case. The patient was awakened from anesthesia and extubated without complication.  The patient went to the PACU in stable condition.   Curlene Labrum, MD [facility] [address] [place], [place] [address] [phone] (office)

## 2020-09-28 NOTE — Transfer of Care (Signed)
Immediate Anesthesia Transfer of Care Note  Patient: Jerry Macdonald  Procedure(s) Performed: LAPAROSCOPIC RIGHT HEMI COLECTOMY (Right Abdomen)  Patient Location: PACU  Anesthesia Type:General  Level of Consciousness: awake, alert , oriented and patient cooperative  Airway & Oxygen Therapy: Patient Spontanous Breathing and Patient connected to nasal cannula oxygen  Post-op Assessment: Report given to RN and Post -op Vital signs reviewed and stable  Post vital signs: Reviewed and stable  Last Vitals:  Vitals Value Taken Time  BP 128/61 09/28/20 1445  Temp    Pulse 71 09/28/20 1446  Resp 23 09/28/20 1446  SpO2 90 % 09/28/20 1446  Vitals shown include unvalidated device data.  Last Pain:  Vitals:   09/28/20 1105  TempSrc: Oral  PainSc: 0-No pain      Patients Stated Pain Goal: 5 (86/76/72 0947)  Complications: No complications documented.

## 2020-09-28 NOTE — Transfer of Care (Deleted)
Immediate Anesthesia Transfer of Care Note  Patient: Jerry Macdonald  Procedure(s) Performed: LAPAROSCOPIC RIGHT HEMI COLECTOMY(POSSIBLE OPEN) (Right )  Patient Location: PACU  Anesthesia Type:General  Level of Consciousness: awake, alert , oriented and patient cooperative  Airway & Oxygen Therapy: Patient Spontanous Breathing  Post-op Assessment: Report given to RN and Post -op Vital signs reviewed and stable  Post vital signs: Reviewed and stable  Last Vitals:  Vitals Value Taken Time  BP    Temp    Pulse    Resp    SpO2      Last Pain: There were no vitals filed for this visit.       Complications: No complications documented.

## 2020-09-29 LAB — CBC WITH DIFFERENTIAL/PLATELET
Abs Immature Granulocytes: 0.03 10*3/uL (ref 0.00–0.07)
Basophils Absolute: 0 10*3/uL (ref 0.0–0.1)
Basophils Relative: 0 %
Eosinophils Absolute: 0 10*3/uL (ref 0.0–0.5)
Eosinophils Relative: 0 %
HCT: 31.4 % — ABNORMAL LOW (ref 39.0–52.0)
Hemoglobin: 9.8 g/dL — ABNORMAL LOW (ref 13.0–17.0)
Immature Granulocytes: 0 %
Lymphocytes Relative: 9 %
Lymphs Abs: 0.9 10*3/uL (ref 0.7–4.0)
MCH: 28.5 pg (ref 26.0–34.0)
MCHC: 31.2 g/dL (ref 30.0–36.0)
MCV: 91.3 fL (ref 80.0–100.0)
Monocytes Absolute: 1.8 10*3/uL — ABNORMAL HIGH (ref 0.1–1.0)
Monocytes Relative: 18 %
Neutro Abs: 7.3 10*3/uL (ref 1.7–7.7)
Neutrophils Relative %: 73 %
Platelets: 201 10*3/uL (ref 150–400)
RBC: 3.44 MIL/uL — ABNORMAL LOW (ref 4.22–5.81)
RDW: 14.6 % (ref 11.5–15.5)
WBC: 10 10*3/uL (ref 4.0–10.5)
nRBC: 0 % (ref 0.0–0.2)

## 2020-09-29 LAB — BASIC METABOLIC PANEL
Anion gap: 6 (ref 5–15)
BUN: 15 mg/dL (ref 8–23)
CO2: 26 mmol/L (ref 22–32)
Calcium: 8.3 mg/dL — ABNORMAL LOW (ref 8.9–10.3)
Chloride: 102 mmol/L (ref 98–111)
Creatinine, Ser: 0.73 mg/dL (ref 0.61–1.24)
GFR, Estimated: >60 mL/min (ref >60)
Glucose, Bld: 126 mg/dL — ABNORMAL HIGH (ref 70–99)
Potassium: 4.1 mmol/L (ref 3.5–5.1)
Sodium: 134 mmol/L — ABNORMAL LOW (ref 135–145)

## 2020-09-29 LAB — PHOSPHORUS: Phosphorus: 3.4 mg/dL (ref 2.5–4.6)

## 2020-09-29 LAB — MAGNESIUM: Magnesium: 1.8 mg/dL (ref 1.7–2.4)

## 2020-09-29 MED ORDER — CHLORHEXIDINE GLUCONATE CLOTH 2 % EX PADS
6.0000 | MEDICATED_PAD | Freq: Every day | CUTANEOUS | Status: DC
  Administered 2020-09-29 – 2020-10-02 (×3): 6 via TOPICAL

## 2020-09-29 NOTE — Progress Notes (Addendum)
[facility] Progress Note  1 Day Post-Op  Subjective: Doing well. Having no nausea. No flatus but having some bowel sounds.   Objective: Vital signs in last 24 hours: Temp:  [97.5 F (36.4 C)-98.6 F (37 C)] 97.7 F (36.5 C) (01/27 0522) Pulse Rate:  [65-84] 65 (01/27 0522) Resp:  [18-29] 18 (01/27 0522) BP: (123-139)/(61-77) 123/64 (01/27 0522) SpO2:  [92 %-98 %] 98 % (01/27 0522) Weight:  [92.2 kg] 92.2 kg (01/27 0522) Last BM Date: 09/27/20  Intake/Output from previous day: 01/26 0701 - 01/27 0700 In: 1161.8 [I.V.:1000; IV Piggyback:161.8] Out: 205 [Urine:175; Blood:30] Intake/Output this shift: Total I/O In: 360 [P.O.:360] Out: 850 [Urine:850]  General appearance: alert, cooperative and no distress Resp: normal work of breathing GI: soft, nondistended, appropriately tender, staples c/d/i with honeycomb  Lab Results:  Recent Labs    09/26/20 1432 09/29/20 0627  WBC 6.8 10.0  HGB 10.4* 9.8*  HCT 32.9* 31.4*  PLT 209 201   BMET Recent Labs    09/26/20 1432 09/29/20 0627  NA 138 134*  K 4.1 4.1  CL 106 102  CO2 25 26  GLUCOSE 115* 126*  BUN 17 15  CREATININE 0.73 0.73  CALCIUM 8.4* 8.3*   PT/INR No results for input(s): LABPROT, INR in the last 72 hours.  Studies/Results: No results found.  Anti-infectives: Anti-infectives (From admission, onward)   Start     Dose/Rate Route Frequency Ordered Stop   09/28/20 1005  clindamycin (CLEOCIN) IVPB 900 mg       "And" Linked Group Details   900 mg 100 mL/hr over 30 Minutes Intravenous 60 min pre-op 09/28/20 1005 09/28/20 1209   09/28/20 1005  gentamicin (GARAMYCIN) 470 mg in dextrose 5 % 100 mL IVPB       "And" Linked Group Details   5 mg/kg  94.8 kg 111.8 mL/hr over 60 Minutes Intravenous 60 min pre-op 09/28/20 1005 09/28/20 1202      Assessment/Plan: Jerry Macdonald is a 87 yo s/p laparoscopic right hemicolectomy for cancer. Doing well.  Tylenol and PRN narcotics OOB and IS CPAP  at night Telemetry Full liquids Binder Labs in AM Foley out SCDs, heparin sq    LOS: 1 day    Jerry Macdonald 09/29/2020

## 2020-09-30 ENCOUNTER — Encounter (HOSPITAL_COMMUNITY): Payer: Self-pay | Admitting: General Surgery

## 2020-09-30 LAB — CBC WITH DIFFERENTIAL/PLATELET
Abs Immature Granulocytes: 0.02 10*3/uL (ref 0.00–0.07)
Basophils Absolute: 0 10*3/uL (ref 0.0–0.1)
Basophils Relative: 0 %
Eosinophils Absolute: 0.1 10*3/uL (ref 0.0–0.5)
Eosinophils Relative: 1 %
HCT: 32.4 % — ABNORMAL LOW (ref 39.0–52.0)
Hemoglobin: 10 g/dL — ABNORMAL LOW (ref 13.0–17.0)
Immature Granulocytes: 0 %
Lymphocytes Relative: 18 %
Lymphs Abs: 1.4 10*3/uL (ref 0.7–4.0)
MCH: 28.4 pg (ref 26.0–34.0)
MCHC: 30.9 g/dL (ref 30.0–36.0)
MCV: 92 fL (ref 80.0–100.0)
Monocytes Absolute: 1.3 10*3/uL — ABNORMAL HIGH (ref 0.1–1.0)
Monocytes Relative: 16 %
Neutro Abs: 5.1 10*3/uL (ref 1.7–7.7)
Neutrophils Relative %: 65 %
Platelets: 220 10*3/uL (ref 150–400)
RBC: 3.52 MIL/uL — ABNORMAL LOW (ref 4.22–5.81)
RDW: 14.6 % (ref 11.5–15.5)
WBC: 7.9 10*3/uL (ref 4.0–10.5)
nRBC: 0 % (ref 0.0–0.2)

## 2020-09-30 LAB — BASIC METABOLIC PANEL
Anion gap: 6 (ref 5–15)
BUN: 13 mg/dL (ref 8–23)
CO2: 27 mmol/L (ref 22–32)
Calcium: 8.6 mg/dL — ABNORMAL LOW (ref 8.9–10.3)
Chloride: 103 mmol/L (ref 98–111)
Creatinine, Ser: 0.72 mg/dL (ref 0.61–1.24)
GFR, Estimated: >60 mL/min (ref >60)
Glucose, Bld: 96 mg/dL (ref 70–99)
Potassium: 3.7 mmol/L (ref 3.5–5.1)
Sodium: 136 mmol/L (ref 135–145)

## 2020-09-30 LAB — TYPE AND SCREEN
ABO/RH(D): O POS
Antibody Screen: NEGATIVE
Unit division: 0

## 2020-09-30 LAB — BPAM RBC
Blood Product Expiration Date: 202202032359
Unit Type and Rh: 5100

## 2020-09-30 MED ORDER — POTASSIUM CHLORIDE 20 MEQ PO PACK
40.0000 meq | PACK | Freq: Once | ORAL | Status: AC
  Administered 2020-09-30: 40 meq via ORAL
  Filled 2020-09-30: qty 2

## 2020-09-30 NOTE — Evaluation (Signed)
Physical Therapy Evaluation Patient Details Name: Jerry Macdonald MRN: [id number] DOB: [birth year] Today's Date: 09/30/2020   History of Present Illness  Jerry Macdonald is a very sweet 87 yo who lives independently and is here today with his POA, Jerry Macdonald a long time friend. He has prostate cancer, HLD, and chronic constipation.  He has no other living relatives aside from a 28 year aunt.  He says that he was found to have anemia and was worked up with a colonoscopy. This demonstrated a mass in the cecum that was near obstructing. He is not clinically obstructed. Biopsies show adenocarcinoma.     He says he is otherwise be doing well and has no complaints. He has no cardiac history. He does have some right groin pain on occasion and has a hernia in this region.      He walks everyday 2 miles and has no chest pain or SOB complaints.    Clinical Impression  Patient functioning near baseline for functional mobility and gait. Patient at sink performing daily grooming/hygine at beginning of session. Patient does not require assist for bed mobility or transfers today. Patient is minimally unsteady with standing while performing ADL. Patient ambulates with use of IV pole with decreased cadence from baseline with minimal unsteadiness. Patient able to navigate level ground and incline/declince with use of IV pole and cueing for change in surface. Patient with unsteadiness without loss of balance. Patient ends session with nursing staff present in room. Patient discharged to care of nursing for ambulation daily as tolerated for length of stay.      Follow Up Recommendations No PT follow up    Equipment Recommendations  None recommended by PT    Recommendations for Other Services       Precautions / Restrictions Precautions Required Braces or Orthoses: Other Brace Other Brace: Abd binder Restrictions Weight Bearing Restrictions: No      Mobility  Bed Mobility Overal bed mobility: Independent                   Transfers Overall transfer level: Independent                  Ambulation/Gait Ambulation/Gait assistance: Independent Gait Distance (Feet): 400 Feet Assistive device: IV Pole Gait Pattern/deviations: WFL(Within Functional Limits) Gait velocity: decreased   General Gait Details: minimally unsteady cadence, uses IV pole, able to ambulate level surface, incline/decline slopes without AD, no loss of balance, slighlty slow  Stairs            Wheelchair Mobility    Modified Rankin (Stroke Patients Only)       Balance Overall balance assessment: Independent (minimally unsteady with standing)                                           Pertinent Vitals/Pain Pain Assessment: No/denies pain    Home Living Family/patient expects to be discharged to:: Private residence Living Arrangements: Non-relatives/Friends Available Help at Discharge: Friend(s);Available 24 hours/day Type of Home: House Home Access: Stairs to enter Entrance Stairs-Rails: Right Entrance Stairs-Number of Steps: 2-3 Home Layout: One level;Laundry or work area in basement;Able to live on main level with bedroom/bathroom Home Equipment: Environmental consultant - 2 wheels;Cane - single point      Prior Function Level of Independence: Independent         Comments: community ambulator without AD, Independent with  ADL, walks 2 miles per day     Hand Dominance        Extremity/Trunk Assessment   Upper Extremity Assessment Upper Extremity Assessment: Overall WFL for tasks assessed    Lower Extremity Assessment Lower Extremity Assessment: Overall WFL for tasks assessed    Cervical / Trunk Assessment Cervical / Trunk Assessment: Normal  Communication   Communication: No difficulties  Cognition Arousal/Alertness: Awake/alert Behavior During Therapy: WFL for tasks assessed/performed Overall Cognitive Status: Within Functional Limits for tasks assessed                                         General Comments      Exercises     Assessment/Plan    PT Assessment Patent does not need any further PT services  PT Problem List         PT Treatment Interventions      PT Goals (Current goals can be found in the Care Plan section)  Acute Rehab PT Goals Patient Stated Goal: Return home with friend PT Goal Formulation: With patient Time For Goal Achievement: 09/30/20 Potential to Achieve Goals: Good    Frequency     Barriers to discharge        Co-evaluation               AM-PAC PT "6 Clicks" Mobility  Outcome Measure Help needed turning from your back to your side while in a flat bed without using bedrails?: None Help needed moving from lying on your back to sitting on the side of a flat bed without using bedrails?: None Help needed moving to and from a bed to a chair (including a wheelchair)?: None Help needed standing up from a chair using your arms (e.g., wheelchair or bedside chair)?: None Help needed to walk in hospital room?: None Help needed climbing 3-5 steps with a railing? : A Little 6 Click Score: 23    End of Session   Activity Tolerance: Patient tolerated treatment well Patient left: in bed;with nursing/sitter in room Nurse Communication: Mobility status PT Visit Diagnosis: Unsteadiness on feet (R26.81);Other abnormalities of gait and mobility (R26.89)    Time: 2683-4196 PT Time Calculation (min) (ACUTE ONLY): 12 min   Charges:   PT Evaluation $PT Eval Low Complexity: 1 Low PT Treatments $Therapeutic Activity: 8-22 mins        11:02 AM, 09/30/20 Mearl Latin PT, DPT Physical Therapist at [facility]

## 2020-09-30 NOTE — Progress Notes (Signed)
[facility] Progress Note  2 Days Post-Op  Subjective: Voided after his foley was removed. Having loose Bms. Wanting more substantial food.   Objective: Vital signs in last 24 hours: Temp:  [98.1 F (36.7 C)-98.2 F (36.8 C)] 98.2 F (36.8 C) (01/27 2155) Pulse Rate:  [63-69] 69 (01/27 2155) Resp:  [18] 18 (01/27 2155) BP: (115-133)/(57-79) 133/79 (01/27 2155) SpO2:  [95 %-96 %] 96 % (01/27 2155) Weight:  [87 kg] 87 kg (01/28 0602) Last BM Date: 09/27/20  Intake/Output from previous day: 01/27 0701 - 01/28 0700 In: 600 [P.O.:600] Out: 3075 [Urine:3075] Intake/Output this shift: No intake/output data recorded.  General appearance: alert, cooperative and no distress GI: soft, nondistended, appropriately tender, midline honeycomb staples c/d/i with no erythema or drainage, port site bandages removed and staples c/d/i Extremities: extremities normal, atraumatic, no cyanosis or edema  Lab Results:  Recent Labs    09/29/20 0627 09/30/20 0527  WBC 10.0 7.9  HGB 9.8* 10.0*  HCT 31.4* 32.4*  PLT 201 220   BMET Recent Labs    09/29/20 0627 09/30/20 0527  NA 134* 136  K 4.1 3.7  CL 102 103  CO2 26 27  GLUCOSE 126* 96  BUN 15 13  CREATININE 0.73 0.72  CALCIUM 8.3* 8.6*   Anti-infectives: Anti-infectives (From admission, onward)   Start     Dose/Rate Route Frequency Ordered Stop   09/28/20 1005  clindamycin (CLEOCIN) IVPB 900 mg       "And" Linked Group Details   900 mg 100 mL/hr over 30 Minutes Intravenous 60 min pre-op 09/28/20 1005 09/28/20 1209   09/28/20 1005  gentamicin (GARAMYCIN) 470 mg in dextrose 5 % 100 mL IVPB       "And" Linked Group Details   5 mg/kg  94.8 kg 111.8 mL/hr over 60 Minutes Intravenous 60 min pre-op 09/28/20 1005 09/28/20 1202      Assessment/Plan: Jerry Macdonald is a 87 yo s/p laparoscopic right hemicolectomy for cancer. Having bowel function.   Tylenol and PRN narcotics OOB and IS CPAP at night Telemetry  off Soft diet but avoid beef/ pork for now  [place] all reassuring  Voiding, LR off  PT for some weakness yesterday, usually walks 2 miles a day  SCDs, heparin sq   Dr. Arnoldo Morale seeing this weekend.    LOS: 2 days    Jerry Macdonald 09/30/2020

## 2020-09-30 NOTE — Discharge Instructions (Signed)
Discharge Surgery Instructions:  Common Complaints: Pain at the incision site is common. This will improve with time. Take your pain medications as described below. Some nausea is common and poor appetite. The main goal is to stay hydrated the first few days after surgery.   Diet/ Activity: Diet as tolerated. You have started and tolerated a diet in the hospital, and should continue to increase what you are able to eat.   You may not have a large appetite, but it is important to stay hydrated. Drink 64 ounces of water a day. Your appetite will return with time.  Keep a dry dressing in place over your staples daily or as needed. Some minor pink/ blood tinged drainage is expected. This will stop in a few days after surgery.  Shower per your regular routine daily.   Wear an abdominal binder daily with activity. You do not have to wear this while sleeping or sitting.  Rest and listen to your body, but do not remain in bed all day.  Walk everyday for at least 15-20 minutes. Deep cough and move around every 1-2 hours in the first few days after surgery.  Do not lift > 10 lbs, perform excessive bending, pushing, pulling, squatting for 6-8 weeks after surgery.  The activity restrictions and the abdominal binder are to prevent hernia formation at your incision while you are healing.  Do not place lotions or balms on your incision unless instructed to specifically by Dr. Constance Haw.   Pain Expectations and Narcotics: -After surgery you will have pain associated with your incisions and this is normal. The pain is muscular and nerve pain, and will get better with time. -You are encouraged and expected to take non narcotic medications like tylenol and ibuprofen (when able) to treat pain as multiple modalities can aid with pain treatment. -Narcotics are only used when pain is severe or there is breakthrough pain. -You are not expected to have a pain score of 0 after surgery, as we cannot prevent pain. A pain  score of 3-4 that allows you to be functional, move, walk, and tolerate some activity is the goal. The pain will continue to improve over the days after surgery and is dependent on your surgery. -Due to [place] law, we are only able to give a certain amount of pain medication to treat post operative pain, and we only give additional narcotics on a patient by patient basis.  -For most laparoscopic surgery, studies have shown that the majority of patients only need 10-15 narcotic pills, and for open surgeries most patients only need 15-20.   -Having appropriate expectations of pain and knowledge of pain management with non narcotics is important as we do not want anyone to become addicted to narcotic pain medication.  -Using ice packs in the first 48 hours and heating pads after 48 hours, wearing an abdominal binder (when recommended), and using over the counter medications are all ways to help with pain management.   -Simple acts like meditation and mindfulness practices after surgery can also help with pain control and research has proven the benefit of these practices.  Medication: Take tylenol and ibuprofen as needed for pain control, alternating every 4-6 hours.  Example:  Tylenol 1000mg  @ 6am, 12noon, 6pm, 23midnight (Do not exceed 4000mg  of tylenol a day). Ibuprofen 800mg  @ 9am, 3pm, 9pm, 3am (Do not exceed 3600mg  of ibuprofen a day).  Take Roxicodone for breakthrough pain every 4 hours.  Take Colace for constipation related to narcotic pain medication.  If you do not have a bowel movement in 2 days, take Miralax over the counter.  Drink plenty of water to also prevent constipation.   Contact Information: If you have questions or concerns, please call our office, [phone], Monday- Thursday 8AM-5PM and Friday 8AM-12Noon.  If it is after hours or on the weekend, please call [facility] Main Number, [phone], and ask to speak to the surgeon on call for Dr. Constance Haw at [facility].    Laparoscopic  Right hemicolectomy, Care After This sheet gives you information about how to care for yourself after your procedure. Your health care provider may also give you more specific instructions. If you have problems or questions, contact your health care provider. What can I expect after the procedure? After your procedure, it is common to have the following:  Pain in your abdomen, especially in the incision areas. You will be given medicine to control the pain.  Tiredness. This is a normal part of the recovery process. Your energy level will return to normal over the next several weeks.  Changes in your bowel movements, especially having bowel movements more often. Talk with your health care provider about how to manage this. Follow these instructions at home: Medicines  Take over-the-counter and prescription medicines only as told by your health care provider.  If you were prescribed an antibiotic medicine, take it as told by your health care provider. Do not stop using the antibiotic even if you start to feel better.  Ask your health care provider if the medicine prescribed to you requires you to avoid driving or using machinery. Eating and drinking  Follow instructions from your health care provider about what you can eat after surgery. You may be asked to begin with a diet that is low in fiber.  Do not drink alcohol if: ? Your health care provider tells you not to drink. ? You are pregnant, may be pregnant, or are planning to become pregnant.  If you drink alcohol: ? Limit how much you use to:  0-1 drink a day for women.  0-2 drinks a day for men. ? Be aware of how much alcohol is in your drink. In the [place] one drink equals one 12 oz bottle of beer (355 mL), one 5 oz glass of wine (148 mL), or one 1 oz glass of hard liquor (44 mL). Incision care  Follow instructions from your health care provider about how to take care of your incisions. Make sure you: ? Wash your hands with  soap and water for at least 20 seconds before and after applying medicine to the area, and before and after changing your bandage (dressing). If soap and water are not available, use hand sanitizer. ? Change your dressing as told by your health care provider. ? Leave stitches (sutures) or staples in place. These skin closures may need to stay in place for 2 weeks or longer. If adhesive strip edges start to loosen and curl up, you may trim the loose edges. Do not remove adhesive strips completely unless your health care provider tells you to do that.  Keep your incisions clean and dry.  Do not wear tight clothing over the incisions. Tight clothing may rub and irritate the incision areas, which may cause the incisions to open.  You may shower.   Check your incision area every day for signs of infection. Check for: ? More redness, swelling, or pain. ? Fluid or blood. ? Warmth. ? Pus or a bad smell.  Activity  Rest as told by your health care provider.  Avoid sitting for a long time without moving. Get up to take short walks every 1-2 hours. This is important to improve blood flow and breathing. Ask for help if you feel weak or unsteady.  Do not lift anything that is heavier than 10 lb (4.5 kg), or the limit that you are told, until your health care provider says that it is safe.  Return to your normal activities as told by your health care provider. Ask your health care provider what activities are safe for you. General instructions  Keep all follow-up visits as told by your health care provider. This is important. You may need a follow-up visit to remove sutures or staples. Contact a health care provider if:  You have more redness, swelling, or pain around your incisions.  You have fluid or blood coming from the incisions.  Your incisions feel warm to the touch.  You have pus or a bad smell coming from your incisions or your dressing.  You have a fever.  Your incisions break  open after sutures or staples have been removed. Get help right away if:  You develop a rash.  You have chest pain or difficulty breathing.  You have pain or swelling in your legs.  You feel light-headed or you faint.  Your abdomen swells (becomes distended).  You have nausea or vomiting.  You have blood in your stool. Summary  After the procedure, it is common to have pain in the abdomen, tiredness, or changes in bowel movements.  Follow instructions from your health care provider about how to care for your incisions.  Begin your diet with low-fiber foods. Your health care provider will tell you when to resume your regular diet.  Contact a health care provider if you have any signs of infection, such as a fever, more redness, swelling, or pain around your incisions, or a bad smell coming from your incisions.  Get help right away if you have a rash, nausea, chest pain, pain or swelling in your legs, or blood in your stool. This information is not intended to replace advice given to you by your health care provider. Make sure you discuss any questions you have with your health care provider. Document Revised: 07/29/2019 Document Reviewed: 07/29/2019 Elsevier Patient Education  2021 [employer].

## 2020-09-30 NOTE — Care Management Important Message (Signed)
Important Message  Patient Details  Name: Jerry Macdonald MRN: [id number] Date of Birth: 03-Oct-1930   Medicare Important Message Given:  Yes     [name] 09/30/2020, 3:38 PM

## 2020-10-01 LAB — CBC WITH DIFFERENTIAL/PLATELET
Abs Immature Granulocytes: 0.03 10*3/uL (ref 0.00–0.07)
Basophils Absolute: 0 10*3/uL (ref 0.0–0.1)
Basophils Relative: 0 %
Eosinophils Absolute: 0.2 10*3/uL (ref 0.0–0.5)
Eosinophils Relative: 3 %
HCT: 31.1 % — ABNORMAL LOW (ref 39.0–52.0)
Hemoglobin: 9.6 g/dL — ABNORMAL LOW (ref 13.0–17.0)
Immature Granulocytes: 0 %
Lymphocytes Relative: 16 %
Lymphs Abs: 1.2 10*3/uL (ref 0.7–4.0)
MCH: 28.7 pg (ref 26.0–34.0)
MCHC: 30.9 g/dL (ref 30.0–36.0)
MCV: 93.1 fL (ref 80.0–100.0)
Monocytes Absolute: 1 10*3/uL (ref 0.1–1.0)
Monocytes Relative: 14 %
Neutro Abs: 4.8 10*3/uL (ref 1.7–7.7)
Neutrophils Relative %: 67 %
Platelets: 215 10*3/uL (ref 150–400)
RBC: 3.34 MIL/uL — ABNORMAL LOW (ref 4.22–5.81)
RDW: 14.6 % (ref 11.5–15.5)
WBC: 7.2 10*3/uL (ref 4.0–10.5)
nRBC: 0 % (ref 0.0–0.2)

## 2020-10-01 MED ORDER — MAGNESIUM OXIDE 400 (241.3 MG) MG PO TABS
400.0000 mg | ORAL_TABLET | Freq: Two times a day (BID) | ORAL | Status: DC
  Administered 2020-10-01 – 2020-10-02 (×3): 400 mg via ORAL
  Filled 2020-10-01 (×3): qty 1

## 2020-10-01 NOTE — Progress Notes (Signed)
3 Days Post-Op  Subjective: Patient states he has had 2 bloody bowel movements yesterday.  The second 1 was less in volume.  He does not feel lightheaded.  He has been ambulating.  He denies any nausea or vomiting.  Objective: Vital signs in last 24 hours: Temp:  [97.4 F (36.3 C)-97.7 F (36.5 C)] 97.7 F (36.5 C) (01/29 0658) Pulse Rate:  [66-76] 66 (01/29 0658) Resp:  [16-18] 16 (01/29 0658) BP: (98-128)/(51-68) 128/68 (01/29 0658) SpO2:  [95 %-98 %] 98 % (01/29 0658) Last BM Date: 09/30/20  Intake/Output from previous day: 01/28 0701 - 01/29 0700 In: 480 [P.O.:480] Out: -  Intake/Output this shift: No intake/output data recorded.  General appearance: alert, cooperative and no distress Resp: clear to auscultation bilaterally Cardio: regular rate and rhythm, S1, S2 normal, no murmur, click, rub or gallop GI: Soft, incisions healing well.  Bowel sounds active.  Lab Results:  Recent Labs    09/30/20 0527 10/01/20 0630  WBC 7.9 7.2  HGB 10.0* 9.6*  HCT 32.4* 31.1*  PLT 220 215   BMET Recent Labs    09/29/20 0627 09/30/20 0527  NA 134* 136  K 4.1 3.7  CL 102 103  CO2 26 27  GLUCOSE 126* 96  BUN 15 13  CREATININE 0.73 0.72  CALCIUM 8.3* 8.6*   PT/INR No results for input(s): LABPROT, INR in the last 72 hours.  Studies/Results: No results found.  Anti-infectives: Anti-infectives (From admission, onward)   Start     Dose/Rate Route Frequency Ordered Stop   09/28/20 1005  clindamycin (CLEOCIN) IVPB 900 mg       "And" Linked Group Details   900 mg 100 mL/hr over 30 Minutes Intravenous 60 min pre-op 09/28/20 1005 09/28/20 1209   09/28/20 1005  gentamicin (GARAMYCIN) 470 mg in dextrose 5 % 100 mL IVPB       "And" Linked Group Details   5 mg/kg  94.8 kg 111.8 mL/hr over 60 Minutes Intravenous 60 min pre-op 09/28/20 1005 09/28/20 1202      Assessment/Plan: s/p Procedure(s): LAPAROSCOPIC RIGHT HEMI COLECTOMY Impression: Stable on postoperative day 3.   Hemoglobin relatively stable.  I reassured the patient that this was normal.  Continue soft diet.  Will check CBC in a.m.  Anticipate discharge in next 24 to 48 hours.  Final pathology is still pending.  Will stop Entereg.  LOS: 3 days    Aviva Signs 10/01/2020

## 2020-10-02 LAB — CBC
HCT: 31.6 % — ABNORMAL LOW (ref 39.0–52.0)
Hemoglobin: 9.5 g/dL — ABNORMAL LOW (ref 13.0–17.0)
MCH: 27.5 pg (ref 26.0–34.0)
MCHC: 30.1 g/dL (ref 30.0–36.0)
MCV: 91.6 fL (ref 80.0–100.0)
Platelets: 228 10*3/uL (ref 150–400)
RBC: 3.45 MIL/uL — ABNORMAL LOW (ref 4.22–5.81)
RDW: 14.6 % (ref 11.5–15.5)
WBC: 6.5 10*3/uL (ref 4.0–10.5)
nRBC: 0 % (ref 0.0–0.2)

## 2020-10-02 NOTE — Progress Notes (Signed)
Patient states understanding of discharge instructions.  

## 2020-10-02 NOTE — Discharge Summary (Signed)
Physician Discharge Summary  Patient ID: Jerry Macdonald MRN: [id number] DOB/AGE: 09/18/1930 87 y.o.  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  He tolerated the surgery well.  His postoperative course was for the most part unremarkable.  His diet was advanced out difficulty once his bowel function returned.  He did initially have some bloody bowel movements but these have since resolved.  His hemoglobin remained stable.  He is being discharged home on 10/02/2020 in good and improving condition.  Final pathology is pending.  Treatments: surgery: Laparoscopic right hemicolectomy on 09/28/2020  Discharge Exam: Blood pressure 130/84, pulse 79, temperature (!) 97.5 F (36.4 C), resp. rate 18, weight 86.5 kg, SpO2 98 %. General appearance: alert, cooperative and no distress Resp: clear to auscultation bilaterally Cardio: regular rate and rhythm, S1, S2 normal, no murmur, click, rub or gallop GI: Soft, incisions healing well.  Bowel sounds active.  Disposition: Discharge disposition: 01-Home or Self Care       Discharge Instructions    Diet - low sodium heart healthy   Complete by: As directed    Increase activity slowly   Complete by: As directed      Allergies as of 10/02/2020      Reactions   Penicillins    Swelling of hands and feet - severe Did it involve swelling of the face/tongue/throat, SOB, or low BP? No Did it involve sudden or severe rash/hives, skin peeling, or any reaction on the inside of your mouth or nose? No Did you need to seek medical attention at a hospital or doctor's office? No When did it last happen?1950s If all above answers are "NO", may proceed with  cephalosporin use.      Medication List    TAKE these medications   acetaminophen 500 MG tablet Commonly known as: TYLENOL Take 500 mg by mouth every 8 (eight) hours as needed for moderate pain.   CALCIUM PO Take 600 mg by mouth daily. With magnesium   finasteride 5 MG tablet Commonly known as: PROSCAR Take 1 tablet (5 mg total) by mouth daily.   magnesium oxide 400 MG tablet Commonly known as: MAG-OX Take 400 mg by mouth 2 (two) times daily.   multivitamins ther. w/minerals Tabs tablet Take 1 tablet by mouth daily.   pantoprazole 40 MG tablet Commonly known as: PROTONIX TAKE 1 TABLET TWICE A DAY BEFORE MEALS What changed: See the new instructions.   tamsulosin 0.4 MG Caps capsule Commonly known as: FLOMAX Take 1 capsule (0.4 mg total) by mouth daily.       Follow-up Information    Virl Cagey, MD Follow up on 10/11/2020.   Specialty: General Surgery Why: staple removal post op Contact information: [address] [place] [facility] [address] [phone]               Signed: Aviva Signs 10/02/2020, 9:39 AM

## 2020-10-03 ENCOUNTER — Telehealth (INDEPENDENT_AMBULATORY_CARE_PROVIDER_SITE_OTHER): Payer: Medicare Other | Admitting: General Surgery

## 2020-10-03 DIAGNOSIS — C180 Malignant neoplasm of cecum: Secondary | ICD-10-CM

## 2020-10-03 NOTE — Telephone Encounter (Signed)
[facility]  Pathology:  FINAL MICROSCOPIC DIAGNOSIS:   A. COLON, RIGHT, RESECTION:  - Invasive colorectal adenocarcinoma, 5 cm.  - Carcinoma focally extends into peri-cecal connective tissue.  - Margins not involved.  - Twenty benign lymph nodes (0/20).  - Separate colonic segment with diverticuli.  - Benign appendix with luminal fibrosis.  - See oncology table.   ONCOLOGY TABLE:  COLON AND RECTUM, CARCINOMA: Resection  Procedure: Right partial colectomy.  Tumor Site: Cecum.  Tumor Size: 5 x 3 cm.  Macroscopic Tumor Perforation: Not identified.  Histologic Type: Colorectal adenocarcinoma.  Histologic Grade: G2, moderately differentiated.  Multiple Primary Sites: Not applicable.  Tumor Extension: Microscopic extension into peri-cecal connective  tissue.  Lymphovascular Invasion: Not identified.  Perineural Invasion: Not identified.  Treatment Effect: No known presurgical therapy.  Margins:    Margin Status for Invasive Carcinoma: Not involved by carcinoma.  Regional Lymph Nodes:    Number of Lymph Nodes with Tumor: 0    Number of Lymph Nodes Examined: 20  Tumor Deposits: Not identified.  Pathologic Stage Classification (pTNM, AJCC 8th Edition): pT3, pN0  Ancillary Studies: MMR and MSI testing is ordered and will be reported  separately.  Representative Tumor Block: A3-A5.  Comments: The carcinoma extends into the muscularis propria and there is  a microscopic focus in which carcinoma involves the immediately adjacent  peri-cecal connective tissue.  (v4.2.0.1)   Notified his [place] of pathology. Patient is doing good she reports. He went home yesterday. Recommended tylenol and ibuprofen and if needs any tramadol then I can send some into the pharmacy.  Curlene Labrum, MD [facility] [address] [place], [place] [address] [phone] (office)

## 2020-10-04 LAB — SURGICAL PATHOLOGY

## 2020-10-06 ENCOUNTER — Other Ambulatory Visit: Payer: Medicare Other

## 2020-10-06 ENCOUNTER — Other Ambulatory Visit: Payer: Self-pay

## 2020-10-06 DIAGNOSIS — C61 Malignant neoplasm of prostate: Secondary | ICD-10-CM | POA: Diagnosis not present

## 2020-10-07 LAB — PSA: Prostate Specific Ag, Serum: 5.8 ng/mL — ABNORMAL HIGH (ref 0.0–4.0)

## 2020-10-10 ENCOUNTER — Ambulatory Visit (HOSPITAL_COMMUNITY): Payer: Medicare Other | Admitting: Hematology

## 2020-10-11 ENCOUNTER — Encounter: Payer: Self-pay | Admitting: General Surgery

## 2020-10-11 ENCOUNTER — Ambulatory Visit (INDEPENDENT_AMBULATORY_CARE_PROVIDER_SITE_OTHER): Payer: Medicare Other | Admitting: General Surgery

## 2020-10-11 ENCOUNTER — Encounter (HOSPITAL_COMMUNITY): Payer: Self-pay

## 2020-10-11 ENCOUNTER — Other Ambulatory Visit: Payer: Self-pay

## 2020-10-11 VITALS — BP 121/70 | HR 57 | Temp 98.2°F | Resp 16 | Ht 70.5 in | Wt 196.0 lb

## 2020-10-11 DIAGNOSIS — C180 Malignant neoplasm of cecum: Secondary | ICD-10-CM

## 2020-10-11 DIAGNOSIS — E041 Nontoxic single thyroid nodule: Secondary | ICD-10-CM

## 2020-10-11 NOTE — Patient Instructions (Signed)
Diet as tolerated.  No heavy lifting > 10 lbs, excessive bending, pushing, pulling, or squatting for 6-8 weeks after surgery.   Steri strips will peel up in 5-7 days, ok to remove them at that time. Keep stools soft and regular.  Will discuss thyroid more at next appointment.

## 2020-10-11 NOTE — Progress Notes (Signed)
[facility] Note   HPI:  87 y.o. Male presents to clinic for post-op follow-up evaluation after laparoscopic right hemicolectomy. He is doing well and has no major complaints. Having Bms and eating. Soreness at times. Did not take any narcotic.   Review of Systems:  No fever or chills No redness  All other review of systems: otherwise negative   Vital Signs:  BP 121/70   Pulse (!) 57   Temp 98.2 F (36.8 C) (Other (Comment))   Resp 16   Ht 5' 10.5" (1.791 m)   Wt 196 lb (88.9 kg)   SpO2 97%   BMI 27.73 kg/m    Physical Exam:  Physical Exam Vitals reviewed.  Cardiovascular:     Rate and Rhythm: Normal rate.  Pulmonary:     Effort: Pulmonary effort is normal.  Abdominal:     General: There is no distension.     Palpations: Abdomen is soft.     Tenderness: There is no abdominal tenderness.     Comments: Staples removed, no erythema or drainage, steri strips applied   Musculoskeletal:        General: Normal range of motion.  Neurological:     Mental Status: He is alert.      Assessment:  87 y.o. yo Male with colon cancer s/p laparoscopic right hemicolectomy and also a multinodular goiter that will Dr. Dorris Fetch suggests needs thyroidectomy. The patient and his POA are very reluctant about the thyroidectomy. He is healing now from the colectomy.   Plan:  Diet as tolerated.  No heavy lifting > 10 lbs, excessive bending, pushing, pulling, or squatting for 6-8 weeks after surgery.   Steri strips will peel up in 5-7 days, ok to remove them at that time. Keep stools soft and regular.  Will discuss thyroid more at next appointment.   Future Appointments  Date Time Provider [place]  10/13/2020  2:45 PM Irine Seal, MD AUR-AUR None  11/01/2020 10:45 AM Derek Jack, MD AP-ACAPA None  11/07/2020  1:30 PM Cassandria Anger, MD REA-REA None  2/72/5366 44:03 AM Clint Guy AP-ACAPA None  11/15/2020 11:15 AM Virl Cagey, MD RS-RS  None     All of the above recommendations were discussed with the patient and patient's family, and all of patient's and family's questions were answered to their expressed satisfaction.  Curlene Labrum, MD [facility] [address] [place], [place] [address] [phone] (office)

## 2020-10-12 NOTE — Progress Notes (Signed)
Subjective:  1. Prostate cancer ([place])   2. Elevated PSA   3. BPH with urinary obstruction   4. Nocturia      Jerry Macdonald is a 87 year-old male established patient who is here evaluation for treatment of prostate cancer.  His prostate cancer was diagnosed 10/04/2014. His PSA at his time of diagnosis was 6. His  PSA was 3.5 in 7/21, 4/08 on 09/06/20 and 5.8 on 10/06/20 but he had a colon resection and a foley prior to that blood draw.  He had colon cancer with negative margins.  He has to have thyroid surgery in the near future.   He remains on finasteride.  His IPSS is 6 with nocturia x 2.  His UA is clear.    03/11/20: Mr. Deller returns today in f/u. He is AS from low volume Gleason 7(3+4) disease found on surveillance biopsy in 8/17. He had a single core Gleason 6 on his initial biopsy in 2016. He has been seen by Dr. Tammi Klippel and he felt continued AS was reasonable. He remains on finasteride and his PSA is back down to 3.5 from 3.8 at his last visit. He remains on tamsulosin. He is voiding well. He has nocturia x 2. He has a good stream without hesitancy. He has less urgency except in the morning. He does have a sensation of incomplete emptying and he can have a weak stream. His IPSS is 9. He has no associated signs or symptoms.   He was originally diagnosed in 2/16 with a single core of Gleason 6, 10% on the left mid lateral core. His PSA has risen very slowly and was 6.52 prior to a repeat biopsy in 8/17. The repeat biopsy showed a 120ml prostate and there were 3 cores positive with 5% Gleason 6 in the left lateral base, 10% Gleason 7(3+4) in the left lateral apex and 20% Gleason 6 in the right medial apex.     IPSS    Row Name 10/13/20 1500         International Prostate Symptom Score   How often have you had the sensation of not emptying your bladder? Less than 1 in 5     How often have you had to urinate less than every two hours? Less than 1 in 5 times     How often have you found you  stopped and started again several times when you urinated? Less than 1 in 5 times     How often have you found it difficult to postpone urination? Less than 1 in 5 times     How often have you had a weak urinary stream? Not at All     How often have you had to strain to start urination? Not at All     How many times did you typically get up at night to urinate? 2 Times     Total IPSS Score 6           Quality of Life due to urinary symptoms   If you were to spend the rest of your life with your urinary condition just the way it is now how would you feel about that? Pleased             ROS:  ROS:  A complete review of systems was performed.  All systems are negative except for pertinent findings as noted.   ROS  Allergies  Allergen Reactions  . Penicillins     Swelling of hands and feet -  severe Did it involve swelling of the face/tongue/throat, SOB, or low BP? No Did it involve sudden or severe rash/hives, skin peeling, or any reaction on the inside of your mouth or nose? No Did you need to seek medical attention at a hospital or doctor's office? No When did it last happen?1950s If all above answers are "NO", may proceed with cephalosporin use.     Outpatient Encounter Medications as of 10/13/2020  Medication Sig  . acetaminophen (TYLENOL) 500 MG tablet Take 500 mg by mouth every 8 (eight) hours as needed for moderate pain.  Marland Kitchen CALCIUM PO Take 600 mg by mouth daily. With magnesium  . magnesium oxide (MAG-OX) 400 MG tablet Take 400 mg by mouth 2 (two) times daily.  . Multiple Vitamins-Minerals (MULTIVITAMINS THER. W/MINERALS) TABS Take 1 tablet by mouth daily.  . pantoprazole (PROTONIX) 40 MG tablet TAKE 1 TABLET TWICE A DAY BEFORE MEALS (Patient taking differently: Take 40 mg by mouth daily.)  . [DISCONTINUED] finasteride (PROSCAR) 5 MG tablet Take 1 tablet (5 mg total) by mouth daily.  . [DISCONTINUED] tamsulosin (FLOMAX) 0.4 MG CAPS capsule Take 1 capsule (0.4 mg  total) by mouth daily.  . finasteride (PROSCAR) 5 MG tablet Take 1 tablet (5 mg total) by mouth daily.  . tamsulosin (FLOMAX) 0.4 MG CAPS capsule Take 1 capsule (0.4 mg total) by mouth daily.   No facility-administered encounter medications on file as of 10/13/2020.    Past Medical History:  Diagnosis Date  . Abnormal prostate biopsy   . Back pain    with radiculopathy  . Cataracts, bilateral   . Constipation   . Goiter   . HOH (hard of hearing)   . Hyperlipidemia   . Hypertrophy of prostate   . Malaise and fatigue   . Obesity   . Pyogenic granuloma    left cheeck  . S/P colonoscopy June 2011   rectal bleeding secondary to hemorrhoids  . S/P colonoscopy 2007   [facility], diverticulosis per Dr. Juel Burrow note  . S/P endoscopy June 2011   Barrett's, mild gastritis and duodenitis  . Seborrheic keratosis   . Sleep apnea     Past Surgical History:  Procedure Laterality Date  . BIOPSY  03/27/2019   Procedure: BIOPSY;  Surgeon: Danie Binder, MD;  [facility];  Service: Endoscopy;;  . BIOPSY  08/16/2020   Procedure: BIOPSY;  Surgeon: Eloise Harman, DO;  [facility];  Service: Endoscopy;;  . COLONOSCOPY  JUN 2011   PAN-COLONIC DIVERTICULOSIS  . COLONOSCOPY  2007   [place]  . COLONOSCOPY WITH PROPOFOL N/A 08/16/2020   Procedure: COLONOSCOPY WITH PROPOFOL;  Surgeon: Eloise Harman, DO;  [facility];  Service: Endoscopy;  Laterality: N/A;  10:45am  . ESOPHAGOGASTRODUODENOSCOPY  06/11/2011   Procedure: ESOPHAGOGASTRODUODENOSCOPY (EGD);  Surgeon: Dorothyann Peng, MD;  [facility];  Service: Endoscopy;  Laterality: N/A;  11:00  . ESOPHAGOGASTRODUODENOSCOPY N/A 05/21/2014   Dr. Fields:Barrett's esophagus/ Medium sized hiatal hernia. path with indefinite dysplasia.   Marland Kitchen ESOPHAGOGASTRODUODENOSCOPY N/A 12/20/2014   Dr. [place] Pert gastric polyp/large HH/6 cm segment of Barrett's esophagus, path with hyperplastic gastric polyp, no  dysplasia. Surveillance April 2017 due   . ESOPHAGOGASTRODUODENOSCOPY N/A 01/20/2016   Procedure: ESOPHAGOGASTRODUODENOSCOPY (EGD);  Surgeon: Danie Binder, MD;  [facility];  Service: Endoscopy;  Laterality: N/A;  1230pm  . ESOPHAGOGASTRODUODENOSCOPY N/A 03/27/2019   Procedure: ESOPHAGOGASTRODUODENOSCOPY (EGD);  Surgeon: Danie Binder, MD;  [facility]  [facility];  Service: Endoscopy;  Laterality: N/A;  12:15pm  . HERNIA REPAIR    . LAPAROSCOPIC PARTIAL COLECTOMY Right 09/28/2020   Procedure: LAPAROSCOPIC RIGHT HEMI COLECTOMY;  Surgeon: Virl Cagey, MD;  Location: [facility];  Service: General;  Laterality: Right;  . UMBILICAL HERNIA REPAIR    . VARICOSE VEIN SURGERY      Social History   Socioeconomic History  . Marital status: Divorced    Spouse name: Not on file  . Number of children: Not on file  . Years of education: Not on file  . Highest education level: Not on file  Occupational History  . Not on file  Tobacco Use  . Smoking status: Former Smoker    Packs/day: 0.50    Years: 2.00    Pack years: 1.00    Types: Cigarettes    Quit date: 09/10/1963    Years since quitting: 57.1  . Smokeless tobacco: Never Used  . Tobacco comment: quit in 1965  Vaping Use  . Vaping Use: Former  Substance and Sexual Activity  . Alcohol use: Not Currently  . Drug use: No  . Sexual activity: Not Currently  Other Topics Concern  . Not on file  Social History Narrative  . Not on file   Social Determinants of Health   Financial Resource Strain: Not on file  Food Insecurity: Not on file  Transportation Needs: No Transportation Needs  . Lack of Transportation (Medical): No  . Lack of Transportation (Non-Medical): No  Physical Activity: Inactive  . Days of Exercise per Week: 0 days  . Minutes of Exercise per Session: 0 min  Stress: Not on file  Social Connections: Not on file  Intimate Partner Violence: Not At Risk  . Fear of Current or Ex-Partner: No  . Emotionally  Abused: No  . Physically Abused: No  . Sexually Abused: No    Family History  Problem Relation Age of Onset  . Cancer Father   . Colon polyps Neg Hx   . Colon cancer Neg Hx        Objective: Vitals:   10/13/20 1455  BP: 129/65  Pulse: 73  Temp: 98 F (36.7 C)     Physical Exam  Lab Results:  Results for orders placed or performed in visit on 10/13/20 (from the past 24 hour(s))  Urinalysis, Routine w reflex microscopic     Status: None   Collection Time: 10/13/20  2:51 PM  Result Value Ref Range   Specific Gravity, UA 1.020 1.005 - 1.030   pH, UA 7.0 5.0 - 7.5   Color, UA Yellow Yellow   Appearance Ur Clear Clear   Leukocytes,UA Negative Negative   Protein,UA Negative Negative/Trace   Glucose, UA Negative Negative   Ketones, UA Negative Negative   RBC, UA Negative Negative   Bilirubin, UA Negative Negative   Urobilinogen, Ur 0.2 0.2 - 1.0 mg/dL   Nitrite, UA Negative Negative   Microscopic Examination Comment    Narrative   Performed at:  [facility] [address], [place], [place]  [id number] Lab Director: Mina Marble MT, Phone:  [phone]    BMET No results for input(s): NA, K, CL, CO2, GLUCOSE, BUN, CREATININE, CALCIUM in the last 72 hours. PSA Lab Results  Component Value Date   PSA1 5.8 (H) 10/06/2020     Lab Results  Component Value Date   PSA 3.5 03/04/2020   PSA Dr. [place] Pink of Urology follows 08/12/2008  Studies/Results: CT in 12/21 showed nothing to suggest prostate cancer mets.   Assessment & Plan: Prostate cancer on surveillance.   His PSA was up about 1.8 points over a month but that was probably related to his recent catheter.   I will repeat the PSA in 3 months and have him return in 6 months with a PSA for an exam.  BPH with BOO.  He is voiding well on tamsulosin and finasteride which were refilled.    Meds ordered this encounter  Medications  . finasteride (PROSCAR) 5 MG tablet    Sig: Take 1  tablet (5 mg total) by mouth daily.    Dispense:  90 tablet    Refill:  3  . tamsulosin (FLOMAX) 0.4 MG CAPS capsule    Sig: Take 1 capsule (0.4 mg total) by mouth daily.    Dispense:  90 capsule    Refill:  3     Orders Placed This Encounter  Procedures  . Urinalysis, Routine w reflex microscopic  . PSA    Standing Status:   Future    Standing Expiration Date:   02/10/2021  . PSA    Standing Status:   Future    Standing Expiration Date:   10/13/2021      Return in about 6 months (around 04/12/2021).   CC: [name], MD      Irine Seal 10/13/2020

## 2020-10-13 ENCOUNTER — Other Ambulatory Visit: Payer: Self-pay

## 2020-10-13 ENCOUNTER — Ambulatory Visit (INDEPENDENT_AMBULATORY_CARE_PROVIDER_SITE_OTHER): Payer: Medicare Other | Admitting: Urology

## 2020-10-13 ENCOUNTER — Encounter: Payer: Self-pay | Admitting: Urology

## 2020-10-13 VITALS — BP 129/65 | HR 73 | Temp 98.0°F | Ht 71.0 in | Wt 195.0 lb

## 2020-10-13 DIAGNOSIS — R972 Elevated prostate specific antigen [PSA]: Secondary | ICD-10-CM | POA: Diagnosis not present

## 2020-10-13 DIAGNOSIS — R351 Nocturia: Secondary | ICD-10-CM | POA: Diagnosis not present

## 2020-10-13 DIAGNOSIS — N401 Enlarged prostate with lower urinary tract symptoms: Secondary | ICD-10-CM

## 2020-10-13 DIAGNOSIS — C61 Malignant neoplasm of prostate: Secondary | ICD-10-CM

## 2020-10-13 DIAGNOSIS — N138 Other obstructive and reflux uropathy: Secondary | ICD-10-CM

## 2020-10-13 LAB — URINALYSIS, ROUTINE W REFLEX MICROSCOPIC
Bilirubin, UA: NEGATIVE
Glucose, UA: NEGATIVE
Ketones, UA: NEGATIVE
Leukocytes,UA: NEGATIVE
Nitrite, UA: NEGATIVE
Protein,UA: NEGATIVE
RBC, UA: NEGATIVE
Specific Gravity, UA: 1.02 (ref 1.005–1.030)
Urobilinogen, Ur: 0.2 mg/dL (ref 0.2–1.0)
pH, UA: 7 (ref 5.0–7.5)

## 2020-10-13 MED ORDER — FINASTERIDE 5 MG PO TABS: 5.0000 mg | ORAL_TABLET | Freq: Every day | ORAL | 3 refills | Status: DC

## 2020-10-13 MED ORDER — TAMSULOSIN HCL 0.4 MG PO CAPS: 0.4000 mg | ORAL_CAPSULE | Freq: Every day | ORAL | 3 refills | Status: DC

## 2020-10-13 NOTE — Progress Notes (Signed)
Urological Symptom Review  Patient is experiencing the following symptoms: Frequent urination Get up at night to urinate   Review of Systems  Gastrointestinal (upper)  : Negative for upper GI symptoms  Gastrointestinal (lower) : Negative for lower GI symptoms  Constitutional : Weight loss  Skin: Negative for skin symptoms  Eyes: Negative for eye symptoms  Ear/Nose/Throat : Negative for Ear/Nose/Throat symptoms  Hematologic/Lymphatic: Negative for Hematologic/Lymphatic symptoms  Cardiovascular : Negative for cardiovascular symptoms  Respiratory : Negative for respiratory symptoms  Endocrine: Excessive thirst  Musculoskeletal: Negative for musculoskeletal symptoms  Neurological: Negative for neurological symptoms  Psychologic: Negative for psychiatric symptoms

## 2020-10-21 DIAGNOSIS — E042 Nontoxic multinodular goiter: Secondary | ICD-10-CM | POA: Diagnosis not present

## 2020-10-22 LAB — TSH: TSH: 0.198 u[IU]/mL — ABNORMAL LOW (ref 0.450–4.500)

## 2020-10-22 LAB — T3, FREE: T3, Free: 3.2 pg/mL (ref 2.0–4.4)

## 2020-10-22 LAB — T4, FREE: Free T4: 1.06 ng/dL (ref 0.82–1.77)

## 2020-10-24 ENCOUNTER — Telehealth (INDEPENDENT_AMBULATORY_CARE_PROVIDER_SITE_OTHER): Payer: Medicare Other | Admitting: "Endocrinology

## 2020-10-24 ENCOUNTER — Encounter: Payer: Self-pay | Admitting: "Endocrinology

## 2020-10-24 VITALS — Ht 70.0 in | Wt 185.0 lb

## 2020-10-24 DIAGNOSIS — E042 Nontoxic multinodular goiter: Secondary | ICD-10-CM

## 2020-10-24 NOTE — Progress Notes (Signed)
**Note Macdonald via Obfuscation** 10/24/2020, 1:36 PM                                     Endocrinology Telehealth Visit Follow up Note -During COVID -19 Pandemic  This visit type was conducted  via telephone due to national recommendations for restrictions regarding the COVID-19 Pandemic  in an effort to limit this patient's exposure and mitigate transmission of the corona virus.   I connected with Jerry Macdonald on 10/24/2020   by telephone and verified that I am speaking with the correct person using two identifiers. Jerry Macdonald, Jan 16, 1931. he has verbally consented to this visit.  I, Glade Lloyd , MD was in my office and patient , Jerry Macdonald , was in his residence. No other persons were with me during the encounter.  He was assisted by friend Kathee Polite during this visit.   All issues noted in this document were discussed and addressed. The format was not optimal for physical exam.    Subjective:    Patient ID: Jerry Macdonald, male    DOB: 04-Jul-1931, PCP [name], MD   Past Medical History:  Diagnosis Date  . Abnormal prostate biopsy   . Back pain    with radiculopathy  . Cataracts, bilateral   . Constipation   . Goiter   . HOH (hard of hearing)   . Hyperlipidemia   . Hypertrophy of prostate   . Malaise and fatigue   . Obesity   . Pyogenic granuloma    left cheeck  . S/P colonoscopy June 2011   rectal bleeding secondary to hemorrhoids  . S/P colonoscopy 2007   [facility], diverticulosis per Dr. Juel Burrow note  . S/P endoscopy June 2011   Barrett's, mild gastritis and duodenitis  . Seborrheic keratosis   . Sleep apnea    Past Surgical History:  Procedure Laterality Date  . BIOPSY  03/27/2019   Procedure: BIOPSY;  Surgeon: Danie Binder, MD;  [facility];  Service: Endoscopy;;  . BIOPSY  08/16/2020   Procedure: BIOPSY;  Surgeon: Eloise Harman, DO;  [facility];  Service: Endoscopy;;  . COLONOSCOPY  JUN  2011   PAN-COLONIC DIVERTICULOSIS  . COLONOSCOPY  2007   [place]  . COLONOSCOPY WITH PROPOFOL N/A 08/16/2020   Procedure: COLONOSCOPY WITH PROPOFOL;  Surgeon: Eloise Harman, DO;  [facility];  Service: Endoscopy;  Laterality: N/A;  10:45am  . ESOPHAGOGASTRODUODENOSCOPY  06/11/2011   Procedure: ESOPHAGOGASTRODUODENOSCOPY (EGD);  Surgeon: Dorothyann Peng, MD;  [facility];  Service: Endoscopy;  Laterality: N/A;  11:00  . ESOPHAGOGASTRODUODENOSCOPY N/A 05/21/2014   Dr. Fields:Barrett's esophagus/ Medium sized hiatal hernia. path with indefinite dysplasia.   Marland Kitchen ESOPHAGOGASTRODUODENOSCOPY N/A 12/20/2014   Dr. Clayburn Pert gastric polyp/large HH/6 cm segment of Barrett's esophagus, path with hyperplastic gastric polyp, no dysplasia. Surveillance April 2017 due   . ESOPHAGOGASTRODUODENOSCOPY N/A 01/20/2016   Procedure: ESOPHAGOGASTRODUODENOSCOPY (EGD);  Surgeon: Danie Binder, MD;  [facility];  Service: Endoscopy;  Laterality: N/A;  1230pm  . ESOPHAGOGASTRODUODENOSCOPY N/A 03/27/2019   Procedure: ESOPHAGOGASTRODUODENOSCOPY (EGD);  Surgeon: Danie Binder, MD;  [facility];  Service: Endoscopy;  Laterality: N/A;  12:15pm  . HERNIA REPAIR    . LAPAROSCOPIC PARTIAL COLECTOMY Right 09/28/2020   Procedure: LAPAROSCOPIC RIGHT HEMI COLECTOMY;  Surgeon: Virl Cagey, MD;  Location: [facility];  Service: General;  Laterality: Right;  . UMBILICAL HERNIA REPAIR    . VARICOSE VEIN SURGERY     Social History   Socioeconomic History  . Marital status: Divorced    Spouse name: Not on file  . Number of children: Not on file  . Years of education: Not on file  . Highest education level: Not on file  Occupational History  . Not on file  Tobacco Use  . Smoking status: Former Smoker    Packs/day: 0.50    Years: 2.00    Pack years: 1.00    Types: Cigarettes    Quit date: 09/10/1963    Years since quitting: 57.1  . Smokeless tobacco: Never Used  . Tobacco  comment: quit in 1965  Vaping Use  . Vaping Use: Former  Substance and Sexual Activity  . Alcohol use: Not Currently  . Drug use: No  . Sexual activity: Not Currently  Other Topics Concern  . Not on file  Social History Narrative  . Not on file   Social Determinants of Health   Financial Resource Strain: Not on file  Food Insecurity: Not on file  Transportation Needs: No Transportation Needs  . Lack of Transportation (Medical): No  . Lack of Transportation (Non-Medical): No  Physical Activity: Inactive  . Days of Exercise per Week: 0 days  . Minutes of Exercise per Session: 0 min  Stress: Not on file  Social Connections: Not on file   Family History  Problem Relation Age of Onset  . Cancer Father   . Colon polyps Neg Hx   . Colon cancer Neg Hx    Outpatient Encounter Medications as of 10/24/2020  Medication Sig  . acetaminophen (TYLENOL) 500 MG tablet Take 500 mg by mouth every 8 (eight) hours as needed for moderate pain.  Marland Kitchen CALCIUM PO Take 600 mg by mouth daily. With magnesium  . finasteride (PROSCAR) 5 MG tablet Take 1 tablet (5 mg total) by mouth daily.  . magnesium oxide (MAG-OX) 400 MG tablet Take 400 mg by mouth 2 (two) times daily.  . Multiple Vitamins-Minerals (MULTIVITAMINS THER. W/MINERALS) TABS Take 1 tablet by mouth daily.  . pantoprazole (PROTONIX) 40 MG tablet TAKE 1 TABLET TWICE A DAY BEFORE MEALS (Patient taking differently: Take 40 mg by mouth daily.)  . tamsulosin (FLOMAX) 0.4 MG CAPS capsule Take 1 capsule (0.4 mg total) by mouth daily.   No facility-administered encounter medications on file as of 10/24/2020.   ALLERGIES: Allergies  Allergen Reactions  . Penicillins     Swelling of hands and feet - severe Did it involve swelling of the face/tongue/throat, SOB, or low BP? No Did it involve sudden or severe rash/hives, skin peeling, or any reaction on the inside of your mouth or nose? No Did you need to seek medical attention at a hospital or doctor's  office? No When did it last happen?1950s If all above answers are "NO", may proceed with cephalosporin use.     VACCINATION STATUS: Immunization History  Administered Date(s) Administered  . DTaP 06/03/2013  . H1N1 08/12/2008  . Influenza Whole 07/07/2007, 05/20/2008  . Influenza-Unspecified 06/03/2013, 06/03/2018  . Pneumococcal-Unspecified 06/03/2013, 06/03/2014  . Varicella 06/03/2013    HPI  AVEION NGUYEN is 87 y.o. male who is being engaged in telehealth via telephone for follow-up of multinodular goiter.  He was assisted by Kathee Polite his friend and power of attorney during this telephone visit.  PMD:  [name], MD.   He is not an optimal historian.  History is obtained mainly from chart review. he has been dealing with symptoms of fatigue, positional shortness of breath, sleep apnea for several years. He denies any significant weight change. He was recently found to Jerry large multinodular goiter on thyroid ultrasound.  He underwent fine-needle aspiration of 5.3 cm nodule on the right lobe which is benign, biopsy results of 1.5 cm left lobe nodule was significant for follicular lesion of undetermined significance.  A sample was sent for Afirma testing which returns  4% risk of malignancy.  He is hesitant to undergo thyroid surgery. He was recently diagnosed with cecal/colon cancer, Status post colon surgery on September 28, 2020.   He is recovering from his surgery.  His previsit thyroid function tests are better than before, normal thyroid hormones, slightly suppressed TSH.  He has minimally fluctuating body weight.  He denies palpitations, tremors, nor heat/cold intolerance.  He is a former smoker, denies any exposure to neck radiations. His other medical problems include BPH/prostate cancer, Barrett's esophagus, sleep apnea, and recently diagnosed cecal/colon cancer. He is cared for by an elderly friend Dola Argyle is also his power of attorney.   Review of  Systems  Limited as above.  Objective:    Vitals with BMI 10/24/2020 10/13/2020 10/11/2020  Height _0  _1  5' 10.5"  Weight 185 lbs 195 lbs 196 lbs  BMI 26.54 79.15 05.69  Systolic - 794 801  Diastolic - 65 70  Pulse - 73 57    Ht _2  (1.778 m)   Wt 185 lb (83.9 kg)   BMI 26.54 kg/m   Wt Readings from Last 3 Encounters:  10/24/20 185 lb (83.9 kg)  10/13/20 195 lb (88.5 kg)  10/11/20 196 lb (88.9 kg)    Physical Exam    CMP ( most recent) CMP     Component Value Date/Time   NA 136 09/30/2020 0527   K 3.7 09/30/2020 0527   CL 103 09/30/2020 0527   CO2 27 09/30/2020 0527   GLUCOSE 96 09/30/2020 0527   BUN 13 09/30/2020 0527   CREATININE 0.72 09/30/2020 0527   CALCIUM 8.6 (L) 09/30/2020 0527   PROT 6.7 09/06/2020 1525   ALBUMIN 3.7 09/06/2020 1525   AST 18 09/06/2020 1525   ALT 13 09/06/2020 1525   ALKPHOS 48 09/06/2020 1525   BILITOT 0.8 09/06/2020 1525   GFRNONAA >60 09/30/2020 0527   GFRAA  02/03/2010 1249    >60        The eGFR has been calculated using the MDRD equation. This calculation has not been validated in all clinical situations. eGFR's persistently <60 mL/min signify possible Chronic Kidney Disease.     Lipid Panel ( most recent) Lipid Panel     Component Value Date/Time   CHOL 139 08/12/2008 2244   TRIG 88 08/12/2008 2244   HDL 39 (L) 08/12/2008 2244   CHOLHDL 3.6 Ratio 08/12/2008 2244   VLDL 18 08/12/2008 2244   LDLCALC 82 08/12/2008 2244      Lab Results  Component Value Date   TSH 0.198 (L) 10/21/2020   TSH 0.075 (L) 09/06/2020   TSH 0.772 08/09/2007   FREET4 1.06 10/21/2020    September 14, 2020 thyroid ultrasound: Right lobe measures 13.8 x 6.7 x 6.4 cm with large 5.3 cm nodule which was biopsied and findings were benign. Left lobe measured 9.9 x 4.9 x 3.5 cm with 2 nodules measuring 4.8 cm Pseudonodule not biopsied and 1.5 cm-suspicious biopsied.  Fine-needle aspiration biopsy results of only thyroid nodule is  available today showing benign follicular nodule.    Assessment & Plan:   1. Multinodular goiter  -His biopsy results are only partially available to review today.  The right sided nodule is consistent with benign follicular nodule, left-sided nodule results were consistent with follicular neoplasm of undetermined significance.  Afirma test is negative for frank malignancy. Patient is recovering from recent laparoscopic colon surgery for cecal/colon cancer.  I had a long discussion with him and his friend Kathee Polite. He is known to Jerry sleep apnea, on CPAP. He is hesitant, and would like to avoid thyroid surgery for now, which is understandable.  He is advised to call clinic if his neck compression symptoms get worse at which time he will be considered for thyroidectomy. -His recent thyroid function tests are consistent with euthyroid state.  He will not need thyroid hormone replacement.  He will Jerry repeat thyroid function test and office visit in 6 months.   - I did not initiate any new prescriptions today.   - he is advised to maintain close follow up with [name], MD for primary care needs.  I encouraged him to keep close follow-up with Dr. Blake Divine for his planned surgeries.     - Time spent on this patient care encounter:  30 minutes of which 50% was spent in  counseling and the rest reviewing  his current and  previous labs / studies and medications  doses and developing a plan for long term care, and documenting this care. Jerry Macdonald  participated in the discussions, expressed understanding, and voiced agreement with the above plans.  All questions were answered to his satisfaction. he is encouraged to contact clinic should he Jerry any questions or concerns prior to his return visit.    Follow up plan: Return in about 6 months (around 04/23/2021) for F/U with Pre-visit Labs.   Glade Lloyd, MD [facility] [address] [place], [place] [address] Phone: [phone]  Fax: [phone]     10/24/2020, 1:36 PM  This note was partially dictated with voice recognition software. Similar sounding words can be transcribed inadequately or may not  be corrected upon review.

## 2020-11-01 ENCOUNTER — Inpatient Hospital Stay (HOSPITAL_COMMUNITY): Payer: Medicare Other | Attending: Hematology | Admitting: Hematology

## 2020-11-01 ENCOUNTER — Other Ambulatory Visit: Payer: Self-pay

## 2020-11-01 ENCOUNTER — Encounter (HOSPITAL_COMMUNITY): Payer: Self-pay | Admitting: Hematology

## 2020-11-01 VITALS — BP 129/62 | HR 74 | Temp 96.7°F | Resp 20 | Wt 196.3 lb

## 2020-11-01 DIAGNOSIS — E785 Hyperlipidemia, unspecified: Secondary | ICD-10-CM | POA: Diagnosis not present

## 2020-11-01 DIAGNOSIS — C61 Malignant neoplasm of prostate: Secondary | ICD-10-CM | POA: Insufficient documentation

## 2020-11-01 DIAGNOSIS — Z87891 Personal history of nicotine dependence: Secondary | ICD-10-CM | POA: Diagnosis not present

## 2020-11-01 DIAGNOSIS — K219 Gastro-esophageal reflux disease without esophagitis: Secondary | ICD-10-CM | POA: Diagnosis not present

## 2020-11-01 DIAGNOSIS — C180 Malignant neoplasm of cecum: Secondary | ICD-10-CM | POA: Insufficient documentation

## 2020-11-01 DIAGNOSIS — Z79899 Other long term (current) drug therapy: Secondary | ICD-10-CM | POA: Insufficient documentation

## 2020-11-01 NOTE — Progress Notes (Signed)
[place], [place] [address]   CLINIC:  Medical Oncology/Hematology  PCP:  [name], MD [address] [place] [address] [phone]   REASON FOR VISIT:  Follow-up for cecal adenocarcinoma  PRIOR THERAPY: Laparoscopic right hemicolectomy on 09/28/2020  NGS Results: Not done  CURRENT THERAPY: Surveillance  BRIEF ONCOLOGIC HISTORY:  Oncology History   No history exists.    CANCER STAGING: Cancer Staging No matching staging information was found for the patient.  INTERVAL HISTORY:  [name], a 87 y.o. 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 Raymar was last seen on 09/06/2020.   Today he is accompanied by his wife and he reports feeling well. His scar is healing nicely and he denies having any abdominal pain over the surgical site. He wears an abdominal binder with the exception of taking a shower. He is feeling better now than he did before the surgery and his BM's are once to twice and regular now. His appetite is excellent. He took iron tablets previously.  He has not spoken to the geneticist.   REVIEW OF SYSTEMS:  Review of Systems  Constitutional: Positive for fatigue (50%). Negative for appetite change.  Gastrointestinal: Negative for abdominal pain, constipation and diarrhea.  All other systems reviewed and are negative.   PAST MEDICAL/SURGICAL HISTORY:  Past Medical History:  Diagnosis Date  . Abnormal prostate biopsy   . Back pain    with radiculopathy  . Cataracts, bilateral   . Constipation   . Goiter   . HOH (hard of hearing)   . Hyperlipidemia   . Hypertrophy of prostate   . Malaise and fatigue   . Obesity   . Pyogenic granuloma    left cheeck  . S/P colonoscopy June 2011   rectal bleeding secondary to hemorrhoids  . S/P colonoscopy 2007   [facility], diverticulosis per Dr. Juel Burrow note  . S/P endoscopy June 2011   Barrett's, mild gastritis and duodenitis   . Seborrheic keratosis   . Sleep apnea    Past Surgical History:  Procedure Laterality Date  . BIOPSY  03/27/2019   Procedure: BIOPSY;  Surgeon: Danie Binder, MD;  [facility];  Service: Endoscopy;;  . BIOPSY  08/16/2020   Procedure: BIOPSY;  Surgeon: Eloise Harman, DO;  [facility];  Service: Endoscopy;;  . COLONOSCOPY  JUN 2011   PAN-COLONIC DIVERTICULOSIS  . COLONOSCOPY  2007   [place] [facility]  . COLONOSCOPY WITH PROPOFOL N/A 08/16/2020   Procedure: COLONOSCOPY WITH PROPOFOL;  Surgeon: Eloise Harman, DO;  [facility];  Service: Endoscopy;  Laterality: N/A;  10:45am  . ESOPHAGOGASTRODUODENOSCOPY  06/11/2011   Procedure: ESOPHAGOGASTRODUODENOSCOPY (EGD);  Surgeon: Dorothyann Peng, MD;  [facility];  Service: Endoscopy;  Laterality: N/A;  11:00  . ESOPHAGOGASTRODUODENOSCOPY N/A 05/21/2014   Dr. Fields:Barrett's esophagus/ Medium sized hiatal hernia. path with indefinite dysplasia.   Marland Kitchen ESOPHAGOGASTRODUODENOSCOPY N/A 12/20/2014   Dr. Clayburn Pert gastric polyp/large HH/6 cm segment of Barrett's esophagus, path with hyperplastic gastric polyp, no dysplasia. Surveillance April 2017 due   . ESOPHAGOGASTRODUODENOSCOPY N/A 01/20/2016   Procedure: ESOPHAGOGASTRODUODENOSCOPY (EGD);  Surgeon: Danie Binder, MD;  [facility];  Service: Endoscopy;  Laterality: N/A;  1230pm  . ESOPHAGOGASTRODUODENOSCOPY N/A 03/27/2019   Procedure: ESOPHAGOGASTRODUODENOSCOPY (EGD);  Surgeon: Danie Binder, MD;  [facility];  Service: Endoscopy;  Laterality: N/A;  12:15pm  .  HERNIA REPAIR    . LAPAROSCOPIC PARTIAL COLECTOMY Right 09/28/2020   Procedure: LAPAROSCOPIC RIGHT HEMI COLECTOMY;  Surgeon: Virl Cagey, MD;  Location: [facility];  Service: General;  Laterality: Right;  . UMBILICAL HERNIA REPAIR    . VARICOSE VEIN SURGERY      SOCIAL HISTORY:  Social History   Socioeconomic History  . Marital status: Divorced    Spouse name: Not  on file  . Number of children: Not on file  . Years of education: Not on file  . Highest education level: Not on file  Occupational History  . Not on file  Tobacco Use  . Smoking status: Former Smoker    Packs/day: 0.50    Years: 2.00    Pack years: 1.00    Types: Cigarettes    Quit date: 09/10/1963    Years since quitting: 57.1  . Smokeless tobacco: Never Used  . Tobacco comment: quit in 1965  Vaping Use  . Vaping Use: Former  Substance and Sexual Activity  . Alcohol use: Not Currently  . Drug use: No  . Sexual activity: Not Currently  Other Topics Concern  . Not on file  Social History Narrative  . Not on file   Social Determinants of Health   Financial Resource Strain: Not on file  Food Insecurity: Not on file  Transportation Needs: No Transportation Needs  . Lack of Transportation (Medical): No  . Lack of Transportation (Non-Medical): No  Physical Activity: Inactive  . Days of Exercise per Week: 0 days  . Minutes of Exercise per Session: 0 min  Stress: Not on file  Social Connections: Not on file  Intimate Partner Violence: Not At Risk  . Fear of Current or Ex-Partner: No  . Emotionally Abused: No  . Physically Abused: No  . Sexually Abused: No    FAMILY HISTORY:  Family History  Problem Relation Age of Onset  . Cancer Father   . Colon polyps Neg Hx   . Colon cancer Neg Hx     CURRENT MEDICATIONS:  Current Outpatient Medications  Medication Sig Dispense Refill  . acetaminophen (TYLENOL) 500 MG tablet Take 500 mg by mouth every 8 (eight) hours as needed for moderate pain.    Marland Kitchen CALCIUM PO Take 600 mg by mouth daily. With magnesium    . finasteride (PROSCAR) 5 MG tablet Take 1 tablet (5 mg total) by mouth daily. 90 tablet 3  . magnesium oxide (MAG-OX) 400 MG tablet Take 400 mg by mouth 2 (two) times daily.    . Multiple Vitamins-Minerals (MULTIVITAMINS THER. W/MINERALS) TABS Take 1 tablet by mouth daily.    . pantoprazole (PROTONIX) 40 MG tablet TAKE 1  TABLET TWICE A DAY BEFORE MEALS (Patient taking differently: Take 40 mg by mouth daily.) 180 tablet 3  . tamsulosin (FLOMAX) 0.4 MG CAPS capsule Take 1 capsule (0.4 mg total) by mouth daily. 90 capsule 3   No current facility-administered medications for this visit.    ALLERGIES:  Allergies  Allergen Reactions  . Penicillins     Swelling of hands and feet - severe Did it involve swelling of the face/tongue/throat, SOB, or low BP? No Did it involve sudden or severe rash/hives, skin peeling, or any reaction on the inside of your mouth or nose? No Did you need to seek medical attention at a hospital or doctor's office? No When did it last happen?1950s If all above answers are "NO", may proceed with cephalosporin use.   Marland Kitchen Penicillin G Other (  See Comments)    PHYSICAL EXAM:  Performance status (ECOG): 0 - Asymptomatic  Vitals:   11/01/20 1043  BP: 129/62  Pulse: 74  Resp: 20  Temp: (!) 96.7 F (35.9 C)  SpO2: 100%   Wt Readings from Last 3 Encounters:  11/01/20 196 lb 4.8 oz (89 kg)  10/24/20 185 lb (83.9 kg)  10/13/20 195 lb (88.5 kg)   Physical Exam Vitals reviewed.  Constitutional:      Appearance: Normal appearance.  Cardiovascular:     Rate and Rhythm: Normal rate and regular rhythm.     Pulses: Normal pulses.     Heart sounds: Normal heart sounds.  Pulmonary:     Effort: Pulmonary effort is normal.     Breath sounds: Normal breath sounds.  Abdominal:     Tenderness: There is no abdominal tenderness.  Musculoskeletal:     Right lower leg: Edema (trace) present.     Left lower leg: No edema.  Neurological:     General: No focal deficit present.     Mental Status: He is alert and oriented to person, place, and time.  Psychiatric:        Mood and Affect: Mood normal.        Behavior: Behavior normal.      LABORATORY DATA:  I have reviewed the labs as listed.  CBC Latest Ref Rng & Units 10/02/2020 10/01/2020 09/30/2020  WBC 4.0 - 10.5 K/uL 6.5 7.2 7.9   Hemoglobin 13.0 - 17.0 g/dL 9.5(L) 9.6(L) 10.0(L)  Hematocrit 39.0 - 52.0 % 31.6(L) 31.1(L) 32.4(L)  Platelets 150 - 400 K/uL 228 215 220   CMP Latest Ref Rng & Units 09/30/2020 09/29/2020 09/26/2020  Glucose 70 - 99 mg/dL 96 126(H) 115(H)  BUN 8 - 23 mg/dL 13 15 17   Creatinine 0.61 - 1.24 mg/dL 0.72 0.73 0.73  Sodium 135 - 145 mmol/L 136 134(L) 138  Potassium 3.5 - 5.1 mmol/L 3.7 4.1 4.1  Chloride 98 - 111 mmol/L 103 102 106  CO2 22 - 32 mmol/L 27 26 25   Calcium 8.9 - 10.3 mg/dL 8.6(L) 8.3(L) 8.4(L)  Total Protein 6.5 - 8.1 g/dL - - -  Total Bilirubin 0.3 - 1.2 mg/dL - - -  Alkaline Phos 38 - 126 U/L - - -  AST 15 - 41 U/L - - -  ALT 0 - 44 U/L - - -   Surgical pathology ([id number]) on 09/28/2020: Right colon resection: invasive colorectal adenocarcinoma, 5 cm, 0/20 LN involved.  DIAGNOSTIC IMAGING:  I have independently reviewed the scans and discussed with the patient. No results found.   ASSESSMENT:  1.  Cecal adenocarcinoma: -Colonoscopy by Dr. Abbey Chatters on 08/16/2020 with fungating/infiltrative submucosal and ulcerated partially obstructing mass found in the cecum and at the ileocecal valve. -Biopsy consistent with adenocarcinoma, MMR preserved. -CT CAP on 08/22/2020 with cycle carcinoma with small ileocolic mesenteric lymph nodes with no evidence of distant metastatic spread.  2. Social/family history: -He is retired from [employer]. He had asbestos exposure. -Maternal and paternal grandfathers died of prostate cancer. Father died of prostate cancer. 2 paternal uncles had prostate cancer.   PLAN:  1.  Stage II (PT3PN0) cecal adenocarcinoma, MMR preserved: -He underwent right hemicolectomy on 09/28/2020. -We reviewed pathology report showing 0/20 lymph nodes involved, margins negative.  MMR is preserved and MSI stable. -He does not require any adjuvant chemotherapy. -We have discussed surveillance plan with CEA and examination every 3 months.  We will do CT [facility] every 6 months  for the first 2 years. -He will come back in 2 months with repeat CT scan of the abdomen and pelvis, CEA and LFTs.  2. Prostate cancer: -Last PSA was 5.8 on 10/06/2020.  He is following up with urology.  3. Family history: -Because of extensive family history and personal history of prostate cancer, I have strongly recommended genetic testing. -He does not have children or siblings.  Hence he does not want to have it checked.  4. Normocytic anemia: -His last hemoglobin was 9.5 with MCV 91.6. -Prior ferritin was low.  Recommend taking iron tablet every other day with stool softener.   Orders placed this encounter:  Orders Placed This Encounter  Procedures  . CT Abdomen Pelvis W Contrast  . CEA  . Ferritin  . Iron and TIBC  . CBC with Differential/Platelet  . Comprehensive metabolic panel     Derek Jack, MD [place] [phone]   I, Milinda Antis, am acting as a scribe for Dr. Sanda Linger.  I, Derek Jack MD, have reviewed the above documentation for accuracy and completeness, and I agree with the above.

## 2020-11-01 NOTE — Patient Instructions (Signed)
[place] at [facility] Discharge Instructions  You were seen today by Dr. Delton Coombes. He went over your recent results; your colon cancer is stage 2 and requires periodic monitoring. Start taking an iron tablet every other day and take a stool softener to prevent you from getting constipation. You will be scheduled to have a CT scan of your abdomen before your next visit. Dr. Delton Coombes will see you back in 2 months for labs and follow up.   Thank you for choosing [place] at [facility] to provide your oncology and hematology care.  To afford each patient quality time with our provider, please arrive at least 15 minutes before your scheduled appointment time.   If you have a lab appointment with the [place] please come in thru the Main Entrance and check in at the main information desk  You need to re-schedule your appointment should you arrive 10 or more minutes late.  We strive to give you quality time with our providers, and arriving late affects you and other patients whose appointments are after yours.  Also, if you no show three or more times for appointments you may be dismissed from the clinic at the providers discretion.     Again, thank you for choosing [facility].  Our hope is that these requests will decrease the amount of time that you wait before being seen by our physicians.       _____________________________________________________________  Should you have questions after your visit to [facility], please contact our office at [phone] between the hours of 8:00 a.m. and 4:30 p.m.  Voicemails left after 4:00 p.m. will not be returned until the following business day.  For prescription refill requests, have your pharmacy contact our office and allow 72 hours.    Cancer Center Support Programs:   > Cancer Support Group  2nd Tuesday of the month 1pm-2pm, Journey Room

## 2020-11-07 ENCOUNTER — Ambulatory Visit: Payer: Medicare Other | Admitting: "Endocrinology

## 2020-11-10 ENCOUNTER — Inpatient Hospital Stay (HOSPITAL_BASED_OUTPATIENT_CLINIC_OR_DEPARTMENT_OTHER): Payer: Medicare Other | Admitting: Genetic Counselor

## 2020-11-10 ENCOUNTER — Other Ambulatory Visit: Payer: Self-pay

## 2020-11-10 DIAGNOSIS — C180 Malignant neoplasm of cecum: Secondary | ICD-10-CM

## 2020-11-10 DIAGNOSIS — Z801 Family history of malignant neoplasm of trachea, bronchus and lung: Secondary | ICD-10-CM

## 2020-11-10 DIAGNOSIS — Z8042 Family history of malignant neoplasm of prostate: Secondary | ICD-10-CM | POA: Diagnosis not present

## 2020-11-10 DIAGNOSIS — C61 Malignant neoplasm of prostate: Secondary | ICD-10-CM | POA: Diagnosis not present

## 2020-11-10 DIAGNOSIS — Z8041 Family history of malignant neoplasm of ovary: Secondary | ICD-10-CM | POA: Diagnosis not present

## 2020-11-15 ENCOUNTER — Ambulatory Visit: Payer: Medicare Other | Admitting: General Surgery

## 2020-11-15 ENCOUNTER — Ambulatory Visit (INDEPENDENT_AMBULATORY_CARE_PROVIDER_SITE_OTHER): Payer: Self-pay | Admitting: General Surgery

## 2020-11-15 ENCOUNTER — Encounter: Payer: Self-pay | Admitting: General Surgery

## 2020-11-15 ENCOUNTER — Other Ambulatory Visit: Payer: Self-pay

## 2020-11-15 VITALS — BP 123/74 | HR 70 | Temp 98.3°F | Resp 18 | Ht 70.5 in | Wt 195.0 lb

## 2020-11-15 DIAGNOSIS — E041 Nontoxic single thyroid nodule: Secondary | ICD-10-CM

## 2020-11-15 DIAGNOSIS — C180 Malignant neoplasm of cecum: Secondary | ICD-10-CM

## 2020-11-15 NOTE — Patient Instructions (Signed)
Activity and diet as tolerated. Follow up with Dr. Dorris Fetch regarding thyroid. If symptoms from thyroid, let us know.

## 2020-11-15 NOTE — Progress Notes (Signed)
[facility] Note   HPI:  87 y.o. Male presents to clinic for follow-up evaluation after right hemicolectomy. he is doing well. His Affirma came back benign. He continues to have no symptoms from the thyroid goiter.  No plans for chemotherapy for his colon cancer. .  Review of Systems:  No pain Tolerating diet Walking 30+ minutes a day  All other review of systems: otherwise negative   Vital Signs:  BP 123/74   Pulse 70   Temp 98.3 F (36.8 C) (Other (Comment))   Resp 18   Ht 5' 10.5" (1.791 m)   Wt 195 lb (88.5 kg)   SpO2 98%   BMI 27.58 kg/m    Physical Exam:  Physical Exam Cardiovascular:     Rate and Rhythm: Normal rate.  Pulmonary:     Effort: Pulmonary effort is normal.  Abdominal:     Palpations: Abdomen is soft.     Tenderness: There is no abdominal tenderness.     Hernia: No hernia is present.     Comments: protuberant abdomen, stable     Assessment:  87 y.o. yo Male with colon caner s/p right hemicolectomy doing well and not requiring any chemotherapy. He is following with oncology. He is seen by endocrinology too and at this time is not symptomatic from his thyroid. He wants to avoid surgery which is reasonable. He is asking about medicine to shrink his thyroid and I recommend they discuss that with Dr. Dorris Fetch.  Plan:  Activity and diet as tolerated. Follow up with Dr. Dorris Fetch regarding thyroid. If symptoms from thyroid, let us know.   All of the above recommendations were discussed with the patient and patient's family, and all of patient's and family's questions were answered to their expressed satisfaction.  Curlene Labrum, MD [facility] [address] [place], [place] [address] [phone] (office)

## 2020-11-18 ENCOUNTER — Encounter (HOSPITAL_COMMUNITY): Payer: Self-pay | Admitting: Genetic Counselor

## 2020-11-18 DIAGNOSIS — Z8041 Family history of malignant neoplasm of ovary: Secondary | ICD-10-CM | POA: Insufficient documentation

## 2020-11-18 DIAGNOSIS — Z801 Family history of malignant neoplasm of trachea, bronchus and lung: Secondary | ICD-10-CM | POA: Insufficient documentation

## 2020-11-18 DIAGNOSIS — Z8042 Family history of malignant neoplasm of prostate: Secondary | ICD-10-CM | POA: Insufficient documentation

## 2020-11-18 NOTE — Progress Notes (Signed)
REFERRING PROVIDER: Derek Jack, MD [address] 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 Prostate cancer ([place])   3. Family history of prostate cancer   4. Family history of lung cancer   5. Family history of ovarian cancer      I connected with Jerry Macdonald on 11/10/2020 at 11:00 am EDT by video conference and verified that I am speaking with the correct person using two identifiers.   Patient location: [place] Provider location: [place]  HISTORY OF PRESENT ILLNESS:   [name], a 87 y.o. male, was seen for a [place] cancer genetics consultation at the request of Dr. Delton Coombes due to a personal and family history of cancer.  Jerry Macdonald presents to clinic today to discuss the possibility of a hereditary predisposition to cancer, genetic testing, and to further clarify his future cancer risks, as well as potential cancer risks for family members.   In 2017, Jerry Macdonald was diagnosed with prostate cancer (Gleason score 3+4=7). In December of 2021, at the age of 61, he was diagnosed with colorectal adenocarcinoma of the cecum. Mismatch repair protein IHC was normal, and the tumor was MSI-stable.   Past Medical History:  Diagnosis Date  . Abnormal prostate biopsy   . Back pain    with radiculopathy  . Cataracts, bilateral   . Constipation   . Family history of lung cancer   . Family history of ovarian cancer   . Family history of prostate cancer   . Goiter   . HOH (hard of hearing)   . Hyperlipidemia   . Hypertrophy of prostate   . Malaise and fatigue   . Obesity   . Pyogenic granuloma    left cheeck  . S/P colonoscopy June 2011   rectal bleeding secondary to hemorrhoids  . S/P colonoscopy 2007   [facility], diverticulosis per Dr. Juel Burrow note  . S/P endoscopy June 2011   Barrett's, mild gastritis and duodenitis  . Seborrheic keratosis   . Sleep apnea      Past Surgical History:  Procedure Laterality Date  . BIOPSY  03/27/2019   Procedure: BIOPSY;  Surgeon: Danie Binder, MD;  [facility];  Service: Endoscopy;;  . BIOPSY  08/16/2020   Procedure: BIOPSY;  Surgeon: Eloise Harman, DO;  [facility];  Service: Endoscopy;;  . COLONOSCOPY  JUN 2011   PAN-COLONIC DIVERTICULOSIS  . COLONOSCOPY  2007   [facility]  . COLONOSCOPY WITH PROPOFOL N/A 08/16/2020   Procedure: COLONOSCOPY WITH PROPOFOL;  Surgeon: Eloise Harman, DO;  [facility];  Service: Endoscopy;  Laterality: N/A;  10:45am  . ESOPHAGOGASTRODUODENOSCOPY  06/11/2011   Procedure: ESOPHAGOGASTRODUODENOSCOPY (EGD);  Surgeon: Dorothyann Peng, MD;  [facility];  Service: Endoscopy;  Laterality: N/A;  11:00  . ESOPHAGOGASTRODUODENOSCOPY N/A 05/21/2014   Dr. Fields:Barrett's esophagus/ Medium sized hiatal hernia. path with indefinite dysplasia.   Marland Kitchen ESOPHAGOGASTRODUODENOSCOPY N/A 12/20/2014   Dr. Clayburn Pert gastric polyp/large HH/6 cm segment of Barrett's esophagus, path with hyperplastic gastric polyp, no dysplasia. Surveillance April 2017 due   . ESOPHAGOGASTRODUODENOSCOPY N/A 01/20/2016   Procedure: ESOPHAGOGASTRODUODENOSCOPY (EGD);  Surgeon: Danie Binder, MD;  [facility];  Service: Endoscopy;  Laterality: N/A;  1230pm  . ESOPHAGOGASTRODUODENOSCOPY N/A 03/27/2019   Procedure: ESOPHAGOGASTRODUODENOSCOPY (EGD);  Surgeon: Danie Binder, MD;  [facility];  Service: Endoscopy;  Laterality: N/A;  12:15pm  . HERNIA REPAIR    . LAPAROSCOPIC PARTIAL COLECTOMY Right 09/28/2020   Procedure: LAPAROSCOPIC RIGHT HEMI COLECTOMY;  Surgeon: Virl Cagey, MD;  Location: [facility];  Service: General;  Laterality: Right;  . UMBILICAL HERNIA REPAIR    . VARICOSE VEIN SURGERY      Social History   Socioeconomic History  . Marital status: Divorced    Spouse name: Not on file  . Number of children: Not on file  . Years of  education: Not on file  . Highest education level: Not on file  Occupational History  . Not on file  Tobacco Use  . Smoking status: Former Smoker    Packs/day: 0.50    Years: 2.00    Pack years: 1.00    Types: Cigarettes    Quit date: 09/10/1963    Years since quitting: 57.2  . Smokeless tobacco: Never Used  . Tobacco comment: quit in 1965  Vaping Use  . Vaping Use: Former  Substance and Sexual Activity  . Alcohol use: Not Currently  . Drug use: No  . Sexual activity: Not Currently  Other Topics Concern  . Not on file  Social History Narrative  . Not on file   Social Determinants of Health   Financial Resource Strain: Not on file  Food Insecurity: Not on file  Transportation Needs: No Transportation Needs  . Lack of Transportation (Medical): No  . Lack of Transportation (Non-Medical): No  Physical Activity: Inactive  . Days of Exercise per Week: 0 days  . Minutes of Exercise per Session: 0 min  Stress: Not on file  Social Connections: Not on file     FAMILY HISTORY:  We obtained a detailed, 4-generation family history.  Significant diagnoses are listed below: Family History  Problem Relation Age of Onset  . Prostate cancer Father 41       metastatic  . Lung cancer Brother 86       heavy smoker  . Prostate cancer Paternal Uncle        metastatic  . Alzheimer's disease Maternal Grandmother   . Prostate cancer Maternal Grandfather 38       metastatic  . Prostate cancer Paternal Grandfather 65       metastatic  . Ovarian cancer Paternal Aunt 42  . Prostate cancer Paternal Uncle        metastatic  . Cancer Cousin 86       unknown type, paternal first cousin  . Liver cancer Cousin        dx 70s, paternal first cousin  . Colon polyps Neg Hx   . Colon cancer Neg Hx    Jerry Macdonald does not have children. He had one brother, who died from lung cancer at age 46 and was a heavy smoker. He also has a paternal half-brother, although he does not have any information  about this relative.  Jerry Macdonald mother died at age 76 without cancer. There were five maternal aunts and three maternal uncles. There is no known cancer among maternal aunts/uncles or maternal cousins. Jerry Macdonald maternal grandmother died at age 75 without cancer. His maternal grandfather 41 from metastatic prostate cancer.  Mr. Vanwieren father died at age 21 from metastatic prostate cancer. There were seven paternal aunts and six paternal uncles. One aunt died from ovarian cancer at age 56. Two uncles died from prostate cancer - one in his 50s and the other  in his 31s. One male cousin had an unknown cancer diagnosed at age 77, and another male cousin had liver cancer diagnosed in her 40s. Mr. Moder paternal grandmother died at age 76 without cancer. His paternal grandfather died at age 20 from metastatic prostate cancer.  Mr. Volkert is unaware of previous family history of genetic testing for hereditary cancer risks. Patient's ancestors are of unknown descent. There is no reported Ashkenazi Jewish ancestry. There is no known consanguinity.  GENETIC COUNSELING ASSESSMENT: Mr. Hilgers is a 87 y.o. male with a personal history of prostate cancer and colon cancer, as well as a family history of prostate cancer and ovarian cancer, which is somewhat suggestive of a hereditary cancer syndrome and predisposition to cancer. We, therefore, discussed and recommended the following at today's visit.   DISCUSSION: We discussed that approximately 5-10% of cancer is hereditary, with most cases of hereditary prostate cancer associated with the BRCA1 and BRCA2 genes. There are other genes that can be associated with hereditary prsotate cancer syndromes. These include HOXB13, CHEK2, the Lynch syndrome genes, etc. We discussed that testing is beneficial for several reasons, including knowing about other cancer risks, identifying potential screening and risk-reduction options that may be appropriate, and to  understand if other family members could be at risk for cancer and allow them to undergo genetic testing.  We reviewed the characteristics, features and inheritance patterns of hereditary cancer syndromes. We also discussed genetic testing, including the appropriate family members to test, the process of testing, insurance coverage and turn-around-time for results. We discussed the implications of a negative, positive and/or variant of uncertain significant result. We recommended Mr. Ravi pursue genetic testing for a hereditary cancer gene pane, such as the [facility] Common Hereditary Cancers + RNA panel.   The Common Hereditary Cancers Panel offered by [facility] includes sequencing and/or deletion duplication testing of the following 47 genes: APC*, ATM*, AXIN2*, BARD1*, BMPR1A*, BRCA1*, BRCA2*, BRIP1*, CDH1*, CDK4, CDKN2A (p14ARF), CDKN2A (p16INK4a), CHEK2*, CTNNA1*, DICER1*, EPCAM (Deletion/duplication testing only), GREM1 (promoter region deletion/duplication testing only), KIT, MEN1*, MLH1*, MSH2*, MSH3*, MSH6*, MUTYH*, NBN*, NF1*, NTHL1, PALB2*, PDGFRA, PMS2*, POLD1*, POLE*, PTEN*, RAD50*, RAD51C*, RAD51D*, SDHB*, SDHC*, SDHD*, SMAD4*, SMARCA4*, STK11*, TP53*, TSC1*, TSC2*, and VHL*.  The following genes were evaluated for sequence changes only: SDHA* and HOXB13 c.251G>A variant only. RNA analysis performed for * genes.   Based on Mr. Ess personal and family history of cancer, he meets medical criteria for genetic testing. Despite that he meets criteria, there may still be an out of pocket cost.   PLAN:  Mr. Shad did not wish to pursue genetic testing at today's visit. We understand this decision and remain available to coordinate genetic testing at any time in the future. We, therefore, recommend Mr. Craton continue to follow the cancer screening guidelines given by his primary healthcare provider.  Lastly, we encouraged Mr. Salton to remain in contact with cancer genetics annually so that we  can continuously update the family history and inform him of any changes in cancer genetics and testing that may be of benefit for this family.   Mr. Meno questions were answered to his satisfaction today. Our contact information was provided should additional questions or concerns arise. Thank you for the referral and allowing [place] to share in the care of your patient.   Clint Guy, [place], [facility] Licensed, Certified Dispensing [email or web address] Phone: [phone]  The patient was seen for a total of 35 minutes in face-to-face genetic counseling.  This patient was discussed  with Drs. Magrinat, Lindi Adie and/or Burr Medico who agrees with the above.    _______________________________________________________________________ For Office Staff:  Number of people involved in session: 1 Was an Intern/ student involved with case: no

## 2020-12-01 ENCOUNTER — Ambulatory Visit: Payer: Medicare Other | Admitting: General Surgery

## 2020-12-02 ENCOUNTER — Other Ambulatory Visit (HOSPITAL_COMMUNITY): Payer: Self-pay | Admitting: Family Medicine

## 2020-12-02 DIAGNOSIS — M25511 Pain in right shoulder: Secondary | ICD-10-CM

## 2020-12-09 DIAGNOSIS — Z23 Encounter for immunization: Secondary | ICD-10-CM | POA: Diagnosis not present

## 2020-12-29 ENCOUNTER — Inpatient Hospital Stay (HOSPITAL_COMMUNITY): Payer: Medicare Other | Attending: Hematology

## 2020-12-29 ENCOUNTER — Ambulatory Visit (HOSPITAL_COMMUNITY)
Admission: RE | Admit: 2020-12-29 | Discharge: 2020-12-29 | Disposition: A | Discharge status: Home / Self Care | Payer: Medicare Other | Source: Ambulatory Visit | Attending: Hematology | Admitting: Hematology

## 2020-12-29 ENCOUNTER — Encounter: Payer: Self-pay | Admitting: General Surgery

## 2020-12-29 ENCOUNTER — Ambulatory Visit (INDEPENDENT_AMBULATORY_CARE_PROVIDER_SITE_OTHER): Payer: Medicare Other | Admitting: General Surgery

## 2020-12-29 ENCOUNTER — Other Ambulatory Visit: Payer: Self-pay

## 2020-12-29 VITALS — BP 116/72 | HR 79 | Temp 98.0°F | Resp 16 | Ht 70.5 in | Wt 199.0 lb

## 2020-12-29 DIAGNOSIS — C180 Malignant neoplasm of cecum: Secondary | ICD-10-CM | POA: Insufficient documentation

## 2020-12-29 DIAGNOSIS — E042 Nontoxic multinodular goiter: Secondary | ICD-10-CM

## 2020-12-29 DIAGNOSIS — K5750 Diverticulosis of both small and large intestine without perforation or abscess without bleeding: Secondary | ICD-10-CM | POA: Diagnosis not present

## 2020-12-29 DIAGNOSIS — K4020 Bilateral inguinal hernia, without obstruction or gangrene, not specified as recurrent: Secondary | ICD-10-CM | POA: Diagnosis not present

## 2020-12-29 DIAGNOSIS — C189 Malignant neoplasm of colon, unspecified: Secondary | ICD-10-CM | POA: Diagnosis not present

## 2020-12-29 DIAGNOSIS — N400 Enlarged prostate without lower urinary tract symptoms: Secondary | ICD-10-CM | POA: Diagnosis not present

## 2020-12-29 LAB — CBC WITH DIFFERENTIAL/PLATELET
Abs Immature Granulocytes: 0.02 10*3/uL (ref 0.00–0.07)
Basophils Absolute: 0 10*3/uL (ref 0.0–0.1)
Basophils Relative: 0 %
Eosinophils Absolute: 0.2 10*3/uL (ref 0.0–0.5)
Eosinophils Relative: 3 %
HCT: 37.3 % — ABNORMAL LOW (ref 39.0–52.0)
Hemoglobin: 11.4 g/dL — ABNORMAL LOW (ref 13.0–17.0)
Immature Granulocytes: 0 %
Lymphocytes Relative: 26 %
Lymphs Abs: 1.6 10*3/uL (ref 0.7–4.0)
MCH: 27.7 pg (ref 26.0–34.0)
MCHC: 30.6 g/dL (ref 30.0–36.0)
MCV: 90.5 fL (ref 80.0–100.0)
Monocytes Absolute: 0.8 10*3/uL (ref 0.1–1.0)
Monocytes Relative: 14 %
Neutro Abs: 3.4 10*3/uL (ref 1.7–7.7)
Neutrophils Relative %: 57 %
Platelets: 194 10*3/uL (ref 150–400)
RBC: 4.12 MIL/uL — ABNORMAL LOW (ref 4.22–5.81)
RDW: 19.4 % — ABNORMAL HIGH (ref 11.5–15.5)
WBC: 6.1 10*3/uL (ref 4.0–10.5)
nRBC: 0 % (ref 0.0–0.2)

## 2020-12-29 LAB — COMPREHENSIVE METABOLIC PANEL
ALT: 15 U/L (ref 0–44)
AST: 21 U/L (ref 15–41)
Albumin: 3.8 g/dL (ref 3.5–5.0)
Alkaline Phosphatase: 56 U/L (ref 38–126)
Anion gap: 4 — ABNORMAL LOW (ref 5–15)
BUN: 22 mg/dL (ref 8–23)
CO2: 29 mmol/L (ref 22–32)
Calcium: 8.9 mg/dL (ref 8.9–10.3)
Chloride: 106 mmol/L (ref 98–111)
Creatinine, Ser: 0.82 mg/dL (ref 0.61–1.24)
GFR, Estimated: >60 mL/min (ref >60)
Glucose, Bld: 93 mg/dL (ref 70–99)
Potassium: 4.6 mmol/L (ref 3.5–5.1)
Sodium: 139 mmol/L (ref 135–145)
Total Bilirubin: 1.1 mg/dL (ref 0.3–1.2)
Total Protein: 6.6 g/dL (ref 6.5–8.1)

## 2020-12-29 LAB — IRON AND TIBC
Iron: 46 ug/dL (ref 45–182)
Saturation Ratios: 13 % — ABNORMAL LOW (ref 17.9–39.5)
TIBC: 362 ug/dL (ref 250–450)
UIBC: 316 ug/dL

## 2020-12-29 LAB — FERRITIN: Ferritin: 12 ng/mL — ABNORMAL LOW (ref 24–336)

## 2020-12-29 MED ORDER — IOHEXOL 300 MG/ML  SOLN
100.0000 mL | Freq: Once | INTRAMUSCULAR | Status: AC | PRN
  Administered 2020-12-29: 100 mL via INTRAVENOUS

## 2020-12-29 NOTE — Patient Instructions (Signed)
Thyroidectomy A thyroidectomy is a surgery that is done to remove the thyroid gland. The thyroid is a butterfly-shaped gland that is located at the lower front of your neck. It produces thyroid hormone, which is a substance that helps to control certain body processes. You may have a:  Total thyroidectomy. All of your thyroid is removed.  Thyroid lobectomy. Part of your thyroid is removed. The amount of thyroid gland tissue that is removed during your surgery depends on the reason for the procedure. Reasons to have this procedure include treatment for:  Thyroid nodules.  Thyroid cancer.  Benign thyroid tumors.  Goiter.  Overactive thyroid gland (hyperthyroidism). There are two ways to do this procedure. Conventional, or open, thyroidectomy uses one large incision to remove the thyroid gland. This is the most common method. Endoscopic thyroidectomy, a less invasive method, uses a narrow tube with a light and camera (endoscope) to remove the gland. Tell a health care provider about:  Any allergies you have.  All medicines you are taking, including vitamins, herbs, eye drops, creams, and over-the-counter medicines.  Any problems you or family members have had with anesthetic medicines.  Any blood disorders you have.  Any surgeries you have had.  Any medical conditions you have.  Whether you are pregnant or may be pregnant. What are the risks? Generally, this is a safe procedure. However, problems may occur, including:  Damage to the parathyroid glands. These are located behind your thyroid gland. They maintain the calcium levels in the body. Damage may lead to: ? A decrease in parathyroid hormone levels (hypoparathyroidism). ? A decrease in calcium levels. This will make your nerves irritable and may cause muscle spasms.  An increase in thyroid hormone.  Damage to the nerves of your voice box (larynx). This can be temporary or long-term (rare).  Hoarseness. This usually  resolves in 24-48 hours.  Bleeding.  Infection. Medicines Ask your health care provider about:  Changing or stopping your regular medicines. This is especially important if you are taking diabetes medicines or blood thinners.  Taking medicines such as aspirin and ibuprofen. These medicines can thin your blood. Do not take these medicines unless your health care provider tells you to take them.  Taking over-the-counter medicines, vitamins, herbs, and supplements. General instructions  You may be asked to shower with a germ-killing soap.  Plan to have someone take you home from the hospital or clinic.  Plan to have a responsible adult care for you for at least 24 hours after you leave the hospital or clinic. This is important. What happens during the procedure?  To reduce your risk of infection: ? Your health care team will wash or sanitize their hands. ? Hair may be removed from the surgical area. ? Your skin will be washed with soap.  An IV will be inserted into one of your veins.  You will be given one or more of the following: ? A medicine to help you relax (sedative). ? A medicine to make you fall asleep (general anesthetic).  Your health care provider will perform your surgery using one of two methods: ? For open thyroidectomy, an incision will be made in your lower neck. Muscles in the area will be separated to reveal your thyroid gland. ? For endoscopic thyroidectomy, several small incisions will be made in your neck, chest, or armpit. An endoscopewill be inserted into an incision.  Your health care provider may monitor laryngeal nerve function during the procedure for safety reasons.  Part or  all of your thyroid gland will be removed.  A tube (drain) may be placed at the incision site to drain blood and fluids that accumulate under the skin after the procedure. The drain may have to stay in place for a day or two after the procedure.  The incision will be closed with  stitches (sutures).  A dressing will be placed over your incision. The procedure may vary among health care providers and hospitals. What happens after the procedure?  Your blood pressure, heart rate, breathing rate, and blood oxygen level will be monitored often until the medicines you were given have worn off.  You will be given pain medicine as needed.  Your provider will check your ability to talk and swallow after the procedure.  You will gradually start to drink liquids and have soft foods as tolerated.  You may have a blood test to check the level of calcium in your body.  If you had a drain put in during the procedure, it will usually be removed the next day. Summary  A thyroidectomy is a surgery that is done to remove the thyroid gland.  The procedure will be done in one of two ways: conventional, or open, thyroidectomy or endoscopic thyroidectomy.  Serious complications are rare.  Plan to have a responsible adult care for you for at least 24 hours after you leave the hospital or clinic. This is important. This information is not intended to replace advice given to you by your health care provider. Make sure you discuss any questions you have with your health care provider. Document Revised: 04/28/2020 Document Reviewed: 04/28/2020 Elsevier Patient Education  [place].

## 2020-12-30 LAB — CEA: CEA: 2.6 ng/mL (ref 0.0–4.7)

## 2021-01-02 NOTE — Progress Notes (Signed)
[facility] History and Physical   Chief Complaint    Follow-up      Jerry Macdonald is a 87 y.o. male.  HPI: Jerry Macdonald is well known to me and has a known multinodular goiter. He had a right hemicolectomy for colon cancer a few months back and has healed nicely. He thinks he is getting more symptomatic from his thyroid and says he thinks the area is getting bigger or is at least worried about it getting larger.   He is here today with Nancy his partner.  He says overall he has been well and is walking daily. He is having regular Bms and eating normally. He feels like his neck is getting more full.    Past Medical History:  Diagnosis Date  . Abnormal prostate biopsy   . Back pain    with radiculopathy  . Cataracts, bilateral   . Constipation   . Family history of lung cancer   . Family history of ovarian cancer   . Family history of prostate cancer   . Goiter   . HOH (hard of hearing)   . Hyperlipidemia   . Hypertrophy of prostate   . Malaise and fatigue   . Obesity   . Pyogenic granuloma    left cheeck  . S/P colonoscopy June 2011   rectal bleeding secondary to hemorrhoids  . S/P colonoscopy 2007   [place] [facility], diverticulosis per Dr. hall's note  . S/P endoscopy June 2011   Barrett's, mild gastritis and duodenitis  . Seborrheic keratosis   . Sleep apnea     Past Surgical History:  Procedure Laterality Date  . BIOPSY  03/27/2019   Procedure: BIOPSY;  Surgeon: Fields, Sandi L, MD;  [facility];  Service: Endoscopy;;  . BIOPSY  08/16/2020   Procedure: BIOPSY;  Surgeon: Carver, Charles K, DO;  [facility];  Service: Endoscopy;;  . COLONOSCOPY  JUN 2011   PAN-COLONIC DIVERTICULOSIS  . COLONOSCOPY  2007   [place] [facility]  . COLONOSCOPY WITH PROPOFOL N/A 08/16/2020   Procedure: COLONOSCOPY WITH PROPOFOL;  Surgeon: Carver, Charles K, DO;  [facility];  Service: Endoscopy;  Laterality: N/A;  10:45am  .  ESOPHAGOGASTRODUODENOSCOPY  06/11/2011   Procedure: ESOPHAGOGASTRODUODENOSCOPY (EGD);  Surgeon: Sandi M Fields, MD;  [facility];  Service: Endoscopy;  Laterality: N/A;  11:00  . ESOPHAGOGASTRODUODENOSCOPY N/A 05/21/2014   Dr. Fields:Barrett's esophagus/ Medium sized hiatal hernia. path with indefinite dysplasia.   . ESOPHAGOGASTRODUODENOSCOPY N/A 12/20/2014   Dr. Fields:small gastric polyp/large HH/6 cm segment of Barrett's esophagus, path with hyperplastic gastric polyp, no dysplasia. Surveillance April 2017 due   . ESOPHAGOGASTRODUODENOSCOPY N/A 01/20/2016   Procedure: ESOPHAGOGASTRODUODENOSCOPY (EGD);  Surgeon: Sandi L Fields, MD;  [facility];  Service: Endoscopy;  Laterality: N/A;  1230pm  . ESOPHAGOGASTRODUODENOSCOPY N/A 03/27/2019   Procedure: ESOPHAGOGASTRODUODENOSCOPY (EGD);  Surgeon: Fields, Sandi L, MD;  [facility];  Service: Endoscopy;  Laterality: N/A;  12:15pm  . HERNIA REPAIR    . LAPAROSCOPIC PARTIAL COLECTOMY Right 09/28/2020   Procedure: LAPAROSCOPIC RIGHT HEMI COLECTOMY;  Surgeon: Quashaun Lazalde C, MD;  Location: [facility];  Service: General;  Laterality: Right;  . UMBILICAL HERNIA REPAIR    . VARICOSE VEIN SURGERY      Family History  Problem Relation Age of Onset  . Prostate cancer Father 82       metastatic  . Lung cancer Brother 83         heavy smoker  . Prostate cancer Paternal Uncle        metastatic  . Alzheimer's disease Maternal Grandmother   . Prostate cancer Maternal Grandfather 82       metastatic  . Prostate cancer Paternal Grandfather 71       metastatic  . Ovarian cancer Paternal Aunt 49  . Prostate cancer Paternal Uncle        metastatic  . Cancer Cousin 60       unknown type, paternal first cousin  . Liver cancer Cousin        dx 30s, paternal first cousin  . Colon polyps Neg Hx   . Colon cancer Neg Hx     Social History   Tobacco Use  . Smoking status: Former Smoker    Packs/day: 0.50    Years: 2.00     Pack years: 1.00    Types: Cigarettes    Quit date: 09/10/1963    Years since quitting: 57.3  . Smokeless tobacco: Never Used  . Tobacco comment: quit in 1965  Vaping Use  . Vaping Use: Former  Substance Use Topics  . Alcohol use: Not Currently  . Drug use: No    Medications: I have reviewed the patient's current medications. Allergies as of 12/29/2020      Reactions   Penicillins    Swelling of hands and feet - severe Did it involve swelling of the face/tongue/throat, SOB, or low BP? No Did it involve sudden or severe rash/hives, skin peeling, or any reaction on the inside of your mouth or nose? No Did you need to seek medical attention at a hospital or doctor's office? No When did it last happen?1950s If all above answers are "NO", may proceed with cephalosporin use.   Penicillin G Other (See Comments)      Medication List       Accurate as of December 29, 2020 11:59 PM. If you have any questions, ask your nurse or doctor.        acetaminophen 500 MG tablet Commonly known as: TYLENOL Take 500 mg by mouth every 8 (eight) hours as needed for moderate pain.   CALCIUM PO Take 600 mg by mouth daily. With magnesium   finasteride 5 MG tablet Commonly known as: PROSCAR Take 1 tablet (5 mg total) by mouth daily.   magnesium oxide 400 MG tablet Commonly known as: MAG-OX Take 400 mg by mouth 2 (two) times daily.   multivitamins ther. w/minerals Tabs tablet Take 1 tablet by mouth daily.   pantoprazole 40 MG tablet Commonly known as: PROTONIX TAKE 1 TABLET TWICE A DAY BEFORE MEALS What changed: See the new instructions.   tamsulosin 0.4 MG Caps capsule Commonly known as: FLOMAX Take 1 capsule (0.4 mg total) by mouth daily.        ROS:  A comprehensive review of systems was negative except for: Ears, nose, mouth, throat, and face: positive for increasing fullness in the neck, ? some diffculty swallowing  Blood pressure 116/72, pulse 79, temperature 98 F (36.7  C), temperature source Other (Comment), resp. rate 16, height 5' 10.5" (1.791 m), weight 199 lb (90.3 kg), SpO2 96 %. Physical Exam Vitals reviewed.  Constitutional:      Appearance: Normal appearance.  HENT:     Head: Normocephalic.     Nose: Nose normal.     Mouth/Throat:     Mouth: Mucous membranes are moist.  Eyes:     Extraocular Movements: Extraocular movements intact.  Neck:       Thyroid: Thyromegaly present.     Comments: Large goiter, fullness in neck  Cardiovascular:     Rate and Rhythm: Normal rate and regular rhythm.  Pulmonary:     Effort: Pulmonary effort is normal.     Breath sounds: Normal breath sounds.  Abdominal:     General: There is no distension.     Palpations: Abdomen is soft.     Tenderness: There is no abdominal tenderness.     Comments: Healing incisions  Musculoskeletal:        General: Normal range of motion.     Cervical back: Normal range of motion.  Skin:    General: Skin is warm.  Neurological:     General: No focal deficit present.     Mental Status: He is alert and oriented to person, place, and time.  Psychiatric:        Mood and Affect: Mood normal.        Behavior: Behavior normal.        Thought Content: Thought content normal.        Judgment: Judgment normal.     Results: CT chest 08/2020 reviewed- large goiter some right lobe going down under sternum  Assessment & Plan:  Jerry Macdonald is a 87 y.o. male with a large multinodular goiter found on his preoperative imaging for his colectomy. Dr. Nida has seen and biopsies down with Affirma which concluded benign etiology. He is complaining / worrying more about compressive symptoms now with more fullness in his neck and feeling like it is getting visbly larger.   -The risk and benefits of thyroidectomy were discussed including but not limited to bleeding, tracheal compression and airway obstruction from bleeding, infection, injury to the recurrent laryngeal nerve, injury to the  external branch of the superior laryngeal nerve, resulting in voice changes, and hypocalcemia from parathyroid injuries. We discussed the post operative care and the need for an admission overnight. We discussed that there will likely be a drain in place after surgery that is typically removed the next morning. After careful consideration, the patient opted to proceed with surgery.   Discussed preop COVID testing. Will touch base with Dr. Nida to get recommendations for post op medication and follow up.   All questions were answered to the satisfaction of the patient and family.     Dalylah Ramey C Elky Funches 01/02/2021, 11:35 AM    

## 2021-01-02 NOTE — H&P (Signed)
[facility] History and Physical   Chief Complaint    Follow-up      Jerry Macdonald is a 87 y.o. male.  HPI: Jerry Macdonald is well known to me and has a known multinodular goiter. He had a right hemicolectomy for colon cancer a few months back and has healed nicely. He thinks he is getting more symptomatic from his thyroid and says he thinks the area is getting bigger or is at least worried about it getting larger.   He is here today with Jerry Macdonald his partner.  He says overall he has been well and is walking daily. He is having regular Bms and eating normally. He feels like his neck is getting more full.    Past Medical History:  Diagnosis Date  . Abnormal prostate biopsy   . Back pain    with radiculopathy  . Cataracts, bilateral   . Constipation   . Family history of lung cancer   . Family history of ovarian cancer   . Family history of prostate cancer   . Goiter   . HOH (hard of hearing)   . Hyperlipidemia   . Hypertrophy of prostate   . Malaise and fatigue   . Obesity   . Pyogenic granuloma    left cheeck  . S/P colonoscopy June 2011   rectal bleeding secondary to hemorrhoids  . S/P colonoscopy 2007   [facility], diverticulosis per Dr. Juel Burrow note  . S/P endoscopy June 2011   Barrett's, mild gastritis and duodenitis  . Seborrheic keratosis   . Sleep apnea     Past Surgical History:  Procedure Laterality Date  . BIOPSY  03/27/2019   Procedure: BIOPSY;  Surgeon: Danie Binder, MD;  [facility];  Service: Endoscopy;;  . BIOPSY  08/16/2020   Procedure: BIOPSY;  Surgeon: Eloise Harman, DO;  [facility];  Service: Endoscopy;;  . COLONOSCOPY  JUN 2011   PAN-COLONIC DIVERTICULOSIS  . [place]  2007   [place]  . COLONOSCOPY WITH PROPOFOL N/A 08/16/2020   Procedure: COLONOSCOPY WITH PROPOFOL;  Surgeon: Eloise Harman, DO;  [facility];  Service: Endoscopy;  Laterality: N/A;  10:45am  .  ESOPHAGOGASTRODUODENOSCOPY  06/11/2011   Procedure: ESOPHAGOGASTRODUODENOSCOPY (EGD);  Surgeon: Dorothyann Peng, MD;  [facility];  Service: Endoscopy;  Laterality: N/A;  11:00  . ESOPHAGOGASTRODUODENOSCOPY N/A 05/21/2014   Dr. Fields:Barrett's esophagus/ Medium sized hiatal hernia. path with indefinite dysplasia.   Marland Kitchen ESOPHAGOGASTRODUODENOSCOPY N/A 12/20/2014   Dr. Clayburn Pert gastric polyp/large HH/6 cm segment of Barrett's esophagus, path with hyperplastic gastric polyp, no dysplasia. Surveillance April 2017 due   . ESOPHAGOGASTRODUODENOSCOPY N/A 01/20/2016   Procedure: ESOPHAGOGASTRODUODENOSCOPY (EGD);  Surgeon: Danie Binder, MD;  [facility];  Service: Endoscopy;  Laterality: N/A;  1230pm  . ESOPHAGOGASTRODUODENOSCOPY N/A 03/27/2019   Procedure: ESOPHAGOGASTRODUODENOSCOPY (EGD);  Surgeon: Danie Binder, MD;  [facility];  Service: Endoscopy;  Laterality: N/A;  12:15pm  . HERNIA REPAIR    . LAPAROSCOPIC PARTIAL COLECTOMY Right 09/28/2020   Procedure: LAPAROSCOPIC RIGHT HEMI COLECTOMY;  Surgeon: Virl Cagey, MD;  Location: [facility];  Service: General;  Laterality: Right;  . UMBILICAL HERNIA REPAIR    . VARICOSE VEIN SURGERY      Family History  Problem Relation Age of Onset  . Prostate cancer Father 35       metastatic  . Lung cancer Brother 54  heavy smoker  . Prostate cancer Paternal Uncle        metastatic  . Alzheimer's disease Maternal Grandmother   . Prostate cancer Maternal Grandfather 67       metastatic  . Prostate cancer Paternal Grandfather 42       metastatic  . Ovarian cancer Paternal Aunt 72  . Prostate cancer Paternal Uncle        metastatic  . Cancer Cousin 26       unknown type, paternal first cousin  . Liver cancer Cousin        dx 60s, paternal first cousin  . Colon polyps Neg Hx   . Colon cancer Neg Hx     Social History   Tobacco Use  . Smoking status: Former Smoker    Packs/day: 0.50    Years: 2.00     Pack years: 1.00    Types: Cigarettes    Quit date: 09/10/1963    Years since quitting: 57.3  . Smokeless tobacco: Never Used  . Tobacco comment: quit in 1965  Vaping Use  . Vaping Use: Former  Substance Use Topics  . Alcohol use: Not Currently  . Drug use: No    Medications: I have reviewed the patient's current medications. Allergies as of 12/29/2020      Reactions   Penicillins    Swelling of hands and feet - severe Did it involve swelling of the face/tongue/throat, SOB, or low BP? No Did it involve sudden or severe rash/hives, skin peeling, or any reaction on the inside of your mouth or nose? No Did you need to seek medical attention at a hospital or doctor's office? No When did it last happen?1950s If all above answers are "NO", may proceed with cephalosporin use.   Penicillin G Other (See Comments)      Medication List       Accurate as of December 29, 2020 11:59 PM. If you have any questions, ask your nurse or doctor.        acetaminophen 500 MG tablet Commonly known as: TYLENOL Take 500 mg by mouth every 8 (eight) hours as needed for moderate pain.   CALCIUM PO Take 600 mg by mouth daily. With magnesium   finasteride 5 MG tablet Commonly known as: PROSCAR Take 1 tablet (5 mg total) by mouth daily.   magnesium oxide 400 MG tablet Commonly known as: MAG-OX Take 400 mg by mouth 2 (two) times daily.   multivitamins ther. w/minerals Tabs tablet Take 1 tablet by mouth daily.   pantoprazole 40 MG tablet Commonly known as: PROTONIX TAKE 1 TABLET TWICE A DAY BEFORE MEALS What changed: See the new instructions.   tamsulosin 0.4 MG Caps capsule Commonly known as: FLOMAX Take 1 capsule (0.4 mg total) by mouth daily.        ROS:  A comprehensive review of systems was negative except for: Ears, nose, mouth, throat, and face: positive for increasing fullness in the neck, ? some diffculty swallowing  Blood pressure 116/72, pulse 79, temperature 98 F (36.7  C), temperature source Other (Comment), resp. rate 16, height 5' 10.5" (1.791 m), weight 199 lb (90.3 kg), SpO2 96 %. Physical Exam Vitals reviewed.  Constitutional:      Appearance: Normal appearance.  HENT:     Head: Normocephalic.     Nose: Nose normal.     Mouth/Throat:     Mouth: Mucous membranes are moist.  Eyes:     Extraocular Movements: Extraocular movements intact.  Neck:  Thyroid: Thyromegaly present.     Comments: Large goiter, fullness in neck  Cardiovascular:     Rate and Rhythm: Normal rate and regular rhythm.  Pulmonary:     Effort: Pulmonary effort is normal.     Breath sounds: Normal breath sounds.  Abdominal:     General: There is no distension.     Palpations: Abdomen is soft.     Tenderness: There is no abdominal tenderness.     Comments: Healing incisions  Musculoskeletal:        General: Normal range of motion.     Cervical back: Normal range of motion.  Skin:    General: Skin is warm.  Neurological:     General: No focal deficit present.     Mental Status: He is alert and oriented to person, place, and time.  Psychiatric:        Mood and Affect: Mood normal.        Behavior: Behavior normal.        Thought Content: Thought content normal.        Judgment: Judgment normal.     Results: CT chest 08/2020 reviewed- large goiter some right lobe going down under sternum  Assessment & Plan:  Jerry Macdonald is a 87 y.o. male with a large multinodular goiter found on his preoperative imaging for his colectomy. Dr. Dorris Fetch has seen and biopsies down with Affirma which concluded benign etiology. He is complaining / worrying more about compressive symptoms now with more fullness in his neck and feeling like it is getting visbly larger.   -The risk and benefits of thyroidectomy were discussed including but not limited to bleeding, tracheal compression and airway obstruction from bleeding, infection, injury to the recurrent laryngeal nerve, injury to the  external branch of the superior laryngeal nerve, resulting in voice changes, and hypocalcemia from parathyroid injuries. We discussed the post operative care and the need for an admission overnight. We discussed that there will likely be a drain in place after surgery that is typically removed the next morning. After careful consideration, the patient opted to proceed with surgery.   Discussed preop COVID testing. Will touch base with Dr. Dorris Fetch to get recommendations for post op medication and follow up.   All questions were answered to the satisfaction of the patient and family.     Virl Cagey 01/02/2021, 11:35 AM

## 2021-01-05 ENCOUNTER — Inpatient Hospital Stay (HOSPITAL_COMMUNITY): Payer: Medicare Other | Attending: Hematology | Admitting: Hematology

## 2021-01-05 ENCOUNTER — Other Ambulatory Visit: Payer: Self-pay

## 2021-01-05 VITALS — BP 125/61 | HR 75 | Temp 97.0°F | Resp 18 | Wt 196.6 lb

## 2021-01-05 DIAGNOSIS — Z801 Family history of malignant neoplasm of trachea, bronchus and lung: Secondary | ICD-10-CM | POA: Diagnosis not present

## 2021-01-05 DIAGNOSIS — Z8546 Personal history of malignant neoplasm of prostate: Secondary | ICD-10-CM | POA: Insufficient documentation

## 2021-01-05 DIAGNOSIS — Z8042 Family history of malignant neoplasm of prostate: Secondary | ICD-10-CM | POA: Diagnosis not present

## 2021-01-05 DIAGNOSIS — K4020 Bilateral inguinal hernia, without obstruction or gangrene, not specified as recurrent: Secondary | ICD-10-CM | POA: Diagnosis not present

## 2021-01-05 DIAGNOSIS — C180 Malignant neoplasm of cecum: Secondary | ICD-10-CM | POA: Diagnosis not present

## 2021-01-05 DIAGNOSIS — Z87891 Personal history of nicotine dependence: Secondary | ICD-10-CM | POA: Insufficient documentation

## 2021-01-05 DIAGNOSIS — N400 Enlarged prostate without lower urinary tract symptoms: Secondary | ICD-10-CM | POA: Insufficient documentation

## 2021-01-05 DIAGNOSIS — D649 Anemia, unspecified: Secondary | ICD-10-CM | POA: Insufficient documentation

## 2021-01-05 DIAGNOSIS — Z77090 Contact with and (suspected) exposure to asbestos: Secondary | ICD-10-CM | POA: Insufficient documentation

## 2021-01-05 DIAGNOSIS — Z79899 Other long term (current) drug therapy: Secondary | ICD-10-CM | POA: Insufficient documentation

## 2021-01-05 NOTE — Progress Notes (Signed)
[place], [place] [address]   CLINIC:  Medical Oncology/Hematology  PCP:  [name], MD [address] [place] [address] [phone]   REASON FOR VISIT:  Follow-up for cecal adenocarcinoma  PRIOR THERAPY: Laparoscopic right hemicolectomy on 09/28/2020  NGS Results: Not done  CURRENT THERAPY: Oral iron tablet QOD  BRIEF ONCOLOGIC HISTORY:  Oncology History   No history exists.    CANCER STAGING: Cancer Staging No matching staging information was found for the patient.  INTERVAL HISTORY:  [name], a 87 y.o. 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 Seraj was last seen on 11/01/2020.   Today he is accompanied by his wife and he reports feeling well. His BM's have improved since having his laparoscopic right hemicolectomy on 01/26. He is taking iron tablet QOD and denies having constipation or diarrhea. His appetite is excellent. He denies having any recent falls.  He is scheduled to have a total thyroidectomy on 05/23.   REVIEW OF SYSTEMS:  Review of Systems  Constitutional: Positive for fatigue. Negative for appetite change.  Gastrointestinal: Negative for constipation and diarrhea.  All other systems reviewed and are negative.   PAST MEDICAL/SURGICAL HISTORY:  Past Medical History:  Diagnosis Date  . Abnormal prostate biopsy   . Back pain    with radiculopathy  . Cataracts, bilateral   . Constipation   . Family history of lung cancer   . Family history of ovarian cancer   . Family history of prostate cancer   . Goiter   . HOH (hard of hearing)   . Hyperlipidemia   . Hypertrophy of prostate   . Malaise and fatigue   . Obesity   . Pyogenic granuloma    left cheeck  . S/P colonoscopy June 2011   rectal bleeding secondary to hemorrhoids  . S/P colonoscopy 2007   [facility], diverticulosis per Dr. Juel Burrow note  . S/P endoscopy June 2011   Barrett's, mild gastritis  and duodenitis  . Seborrheic keratosis   . Sleep apnea    Past Surgical History:  Procedure Laterality Date  . BIOPSY  03/27/2019   Procedure: BIOPSY;  Surgeon: Danie Binder, MD;  [facility];  Service: Endoscopy;;  . BIOPSY  08/16/2020   Procedure: BIOPSY;  Surgeon: Eloise Harman, DO;  [facility];  Service: Endoscopy;;  . COLONOSCOPY  JUN 2011   PAN-COLONIC DIVERTICULOSIS  . COLONOSCOPY  2007   [place] [facility]  . COLONOSCOPY WITH PROPOFOL N/A 08/16/2020   Procedure: COLONOSCOPY WITH PROPOFOL;  Surgeon: Eloise Harman, DO;  [facility];  Service: Endoscopy;  Laterality: N/A;  10:45am  . ESOPHAGOGASTRODUODENOSCOPY  06/11/2011   Procedure: ESOPHAGOGASTRODUODENOSCOPY (EGD);  Surgeon: Dorothyann Peng, MD;  [facility];  Service: Endoscopy;  Laterality: N/A;  11:00  . ESOPHAGOGASTRODUODENOSCOPY N/A 05/21/2014   Dr. Fields:Barrett's esophagus/ Medium sized hiatal hernia. path with indefinite dysplasia.   Marland Kitchen ESOPHAGOGASTRODUODENOSCOPY N/A 12/20/2014   Dr. Clayburn Pert gastric polyp/large HH/6 cm segment of Barrett's esophagus, path with hyperplastic gastric polyp, no dysplasia. Surveillance April 2017 due   . ESOPHAGOGASTRODUODENOSCOPY N/A 01/20/2016   Procedure: ESOPHAGOGASTRODUODENOSCOPY (EGD);  Surgeon: Danie Binder, MD;  [facility];  Service: Endoscopy;  Laterality: N/A;  1230pm  . ESOPHAGOGASTRODUODENOSCOPY N/A 03/27/2019   Procedure: ESOPHAGOGASTRODUODENOSCOPY (EGD);  Surgeon: Danie Binder, MD;  [facility];  Service: Endoscopy;  Laterality:  N/A;  12:15pm  . HERNIA REPAIR    . LAPAROSCOPIC PARTIAL COLECTOMY Right 09/28/2020   Procedure: LAPAROSCOPIC RIGHT HEMI COLECTOMY;  Surgeon: Virl Cagey, MD;  Location: [facility];  Service: General;  Laterality: Right;  . UMBILICAL HERNIA REPAIR    . VARICOSE VEIN SURGERY      SOCIAL HISTORY:  Social History   Socioeconomic History  . Marital status: Divorced     Spouse name: Not on file  . Number of children: Not on file  . Years of education: Not on file  . Highest education level: Not on file  Occupational History  . Not on file  Tobacco Use  . Smoking status: Former Smoker    Packs/day: 0.50    Years: 2.00    Pack years: 1.00    Types: Cigarettes    Quit date: 09/10/1963    Years since quitting: 57.3  . Smokeless tobacco: Never Used  . Tobacco comment: quit in 1965  Vaping Use  . Vaping Use: Former  Substance and Sexual Activity  . Alcohol use: Not Currently  . Drug use: No  . Sexual activity: Not Currently  Other Topics Concern  . Not on file  Social History Narrative  . Not on file   Social Determinants of Health   Financial Resource Strain: Not on file  Food Insecurity: Not on file  Transportation Needs: No Transportation Needs  . Lack of Transportation (Medical): No  . Lack of Transportation (Non-Medical): No  Physical Activity: Inactive  . Days of Exercise per Week: 0 days  . Minutes of Exercise per Session: 0 min  Stress: Not on file  Social Connections: Not on file  Intimate Partner Violence: Not At Risk  . Fear of Current or Ex-Partner: No  . Emotionally Abused: No  . Physically Abused: No  . Sexually Abused: No    FAMILY HISTORY:  Family History  Problem Relation Age of Onset  . Prostate cancer Father 72       metastatic  . Lung cancer Brother 2       heavy smoker  . Prostate cancer Paternal Uncle        metastatic  . Alzheimer's disease Maternal Grandmother   . Prostate cancer Maternal Grandfather 4       metastatic  . Prostate cancer Paternal Grandfather 30       metastatic  . Ovarian cancer Paternal Aunt 6  . Prostate cancer Paternal Uncle        metastatic  . Cancer Cousin 77       unknown type, paternal first cousin  . Liver cancer Cousin        dx 49s, paternal first cousin  . Colon polyps Neg Hx   . Colon cancer Neg Hx     CURRENT MEDICATIONS:  Current Outpatient Medications   Medication Sig Dispense Refill  . ferrous sulfate 324 MG TBEC Take 324 mg by mouth every other day.    Marland Kitchen acetaminophen (TYLENOL) 500 MG tablet Take 500 mg by mouth every 8 (eight) hours as needed for moderate pain.    Marland Kitchen CALCIUM PO Take 600 mg by mouth daily. With magnesium    . finasteride (PROSCAR) 5 MG tablet Take 1 tablet (5 mg total) by mouth daily. 90 tablet 3  . magnesium oxide (MAG-OX) 400 MG tablet Take 400 mg by mouth 2 (two) times daily.    . Multiple Vitamins-Minerals (MULTIVITAMINS THER. W/MINERALS) TABS Take 1 tablet by mouth daily.    Marland Kitchen  pantoprazole (PROTONIX) 40 MG tablet TAKE 1 TABLET TWICE A DAY BEFORE MEALS (Patient taking differently: Take 40 mg by mouth daily.) 180 tablet 3  . tamsulosin (FLOMAX) 0.4 MG CAPS capsule Take 1 capsule (0.4 mg total) by mouth daily. 90 capsule 3   No current facility-administered medications for this visit.    ALLERGIES:  Allergies  Allergen Reactions  . Penicillins     Swelling of hands and feet - severe Did it involve swelling of the face/tongue/throat, SOB, or low BP? No Did it involve sudden or severe rash/hives, skin peeling, or any reaction on the inside of your mouth or nose? No Did you need to seek medical attention at a hospital or doctor's office? No When did it last happen?1950s If all above answers are "NO", may proceed with cephalosporin use.   Marland Kitchen Penicillin G Other (See Comments)    PHYSICAL EXAM:  Performance status (ECOG): 0 - Asymptomatic  Vitals:   01/05/21 1527  BP: 125/61  Pulse: 75  Resp: 18  Temp: (!) 97 F (36.1 C)  SpO2: 97%   Wt Readings from Last 3 Encounters:  01/05/21 196 lb 9.6 oz (89.2 kg)  12/29/20 199 lb (90.3 kg)  11/15/20 195 lb (88.5 kg)   Physical Exam Vitals reviewed.  Constitutional:      Appearance: Normal appearance.  Neck:     Thyroid: No thyroid tenderness.  Cardiovascular:     Rate and Rhythm: Normal rate and regular rhythm.     Pulses: Normal pulses.     Heart  sounds: Normal heart sounds.  Pulmonary:     Effort: Pulmonary effort is normal.     Breath sounds: Normal breath sounds.  Abdominal:     Palpations: Abdomen is soft. There is no hepatomegaly, splenomegaly or mass.     Tenderness: There is no abdominal tenderness.     Hernia: No hernia is present.  Musculoskeletal:     Cervical back: No muscular tenderness.     Right lower leg: No edema.     Left lower leg: No edema.  Neurological:     General: No focal deficit present.     Mental Status: He is alert and oriented to person, place, and time.  Psychiatric:        Mood and Affect: Mood normal.        Behavior: Behavior normal.      LABORATORY DATA:  I have reviewed the labs as listed.  CBC Latest Ref Rng & Units 12/29/2020 10/02/2020 10/01/2020  WBC 4.0 - 10.5 K/uL 6.1 6.5 7.2  Hemoglobin 13.0 - 17.0 g/dL 11.4(L) 9.5(L) 9.6(L)  Hematocrit 39.0 - 52.0 % 37.3(L) 31.6(L) 31.1(L)  Platelets 150 - 400 K/uL 194 228 215   CMP Latest Ref Rng & Units 12/29/2020 09/30/2020 09/29/2020  Glucose 70 - 99 mg/dL 93 96 126(H)  BUN 8 - 23 mg/dL _0 Creatinine 0.61 - 1.24 mg/dL 0.82 0.72 0.73  Sodium 135 - 145 mmol/L 139 136 134(L)  Potassium 3.5 - 5.1 mmol/L 4.6 3.7 4.1  Chloride 98 - 111 mmol/L 106 103 102  CO2 22 - 32 mmol/L _1 Calcium 8.9 - 10.3 mg/dL 8.9 8.6(L) 8.3(L)  Total Protein 6.5 - 8.1 g/dL 6.6 - -  Total Bilirubin 0.3 - 1.2 mg/dL 1.1 - -  Alkaline Phos 38 - 126 U/L 56 - -  AST 15 - 41 U/L 21 - -  ALT 0 - 44 U/L 15 - -  Lab Results  Component Value Date   TIBC 362 12/29/2020   TIBC 343 09/06/2020   FERRITIN 12 (L) 12/29/2020   FERRITIN 8 (L) 09/06/2020   IRONPCTSAT 13 (L) 12/29/2020   IRONPCTSAT 4 (L) 09/06/2020   Lab Results  Component Value Date   CEA1 2.6 12/29/2020   CEA1 6.6 (H) 09/06/2020    DIAGNOSTIC IMAGING:  I have independently reviewed the scans and discussed with the patient. CT Abdomen Pelvis W Contrast  Result Date: 12/29/2020 CLINICAL DATA:   Follow-up colon carcinoma. Approximately 3 months postop from right hemicolectomy. EXAM: CT ABDOMEN AND PELVIS WITH CONTRAST TECHNIQUE: Multidetector CT imaging of the abdomen and pelvis was performed using the standard protocol following bolus administration of intravenous contrast. CONTRAST:  145m OMNIPAQUE IOHEXOL 300 MG/ML  SOLN COMPARISON:  08/22/2020 FINDINGS: Lower Chest: No acute findings. Bilateral calcified pleural plaque again noted, consistent with asbestos related pleural disease. Hepatobiliary: No hepatic masses identified. Small cyst in left hepatic lobe remains stable. Gallbladder is unremarkable. No evidence of biliary ductal dilatation. Pancreas:  No mass or inflammatory changes. Spleen: Within normal limits in size and appearance. Adrenals/Urinary Tract: No masses identified. No evidence of ureteral calculi or hydronephrosis. Stomach/Bowel: Expected postop changes are seen from right colectomy. No soft tissue masses identified. Colonic diverticulosis is again noted, however there is no evidence of diverticulitis. No evidence of obstruction, inflammatory process or abnormal fluid collections. Vascular/Lymphatic: No pathologically enlarged lymph nodes. No acute vascular findings. Aortic atherosclerotic calcification noted. Reproductive:  Stable moderately enlarged prostate gland. Other: Stable moderate right inguinal hernia and small left inguinal hernia, both containing only fat. Musculoskeletal:  No suspicious bone lesions identified. IMPRESSION: Expected postop changes from right colectomy. No evidence of recurrent or metastatic carcinoma within the abdomen or pelvis. Colonic diverticulosis. No radiographic evidence of diverticulitis. Stable moderately enlarged prostate. Stable bilateral inguinal hernias containing only fat. Asbestos related pleural disease. Aortic Atherosclerosis (ICD10-I70.0). Electronically Signed   By: JMarlaine HindM.D.   On: 12/29/2020 17:01     ASSESSMENT:  1. Cecal  adenocarcinoma: -Colonoscopy by Dr. CAbbey Chatterson 08/16/2020 with fungating/infiltrative submucosal and ulcerated partially obstructing mass found in the cecum and at the ileocecal valve. -Biopsy consistent with adenocarcinoma, MMR preserved. -CT CAP on 08/22/2020 with cycle carcinoma with small ileocolic mesenteric lymph nodes with no evidence of distant metastatic spread.  2. Social/family history: -He is retired from [employer] He had asbestos exposure. -Maternal and paternal grandfathers died of prostate cancer. Father died of prostate cancer. 2 paternal uncles had prostate cancer.   PLAN:  1.  Stage II (PT3PN0) cecal adenocarcinoma, MMR preserved: -He does not report any change in bowels or bleeding per rectum. - Reviewed CT [facility] from 12/29/2020 which showed no evidence of recurrence or metastatic disease. - CEA was 2.6.  LFTs were normal. - RTC 3 months with repeat CEA and routine labs.  2. Prostate cancer: -Last PSA was 5.8 on 10/06/2020.  Continue follow-up with urology.  3. Family history: -Because of his extensive family history and personal history of prostate cancer, recommended genetic testing.  He does not have children or siblings.  Hence he declined testing.  4. Normocytic anemia: -His hemoglobin improved 11.4.  Ferritin is low at 12. - Continue iron tablet every other day with stool softener.  We will repeat in 3 months.   Orders placed this encounter:  Orders Placed This Encounter  Procedures  . CBC with Differential/Platelet  . Comprehensive metabolic panel  . Iron and TIBC  .  Ferritin  . CEA     Derek Jack, MD [place] [phone]   I, Milinda Antis, am acting as a scribe for Dr. Sanda Linger.  I, Derek Jack MD, have reviewed the above documentation for accuracy and completeness, and I agree with the above.

## 2021-01-05 NOTE — Patient Instructions (Signed)
[place] at [facility] Discharge Instructions  You were seen today by Dr. Delton Coombes. He went over your recent results and scans. Continue taking your iron tablet every other day. Proceed with your surgery on May 23rd. Dr. Delton Coombes will see you back in 3 months for labs and follow up.   Thank you for choosing [place] at [facility] to provide your oncology and hematology care.  To afford each patient quality time with our provider, please arrive at least 15 minutes before your scheduled appointment time.   If you have a lab appointment with the [facility] please come in thru the Main Entrance and check in at the main information desk  You need to re-schedule your appointment should you arrive 10 or more minutes late.  We strive to give you quality time with our providers, and arriving late affects you and other patients whose appointments are after yours.  Also, if you no show three or more times for appointments you may be dismissed from the clinic at the providers discretion.     Again, thank you for choosing [facility].  Our hope is that these requests will decrease the amount of time that you wait before being seen by our physicians.       _____________________________________________________________  Should you have questions after your visit to [facility], please contact our office at [phone] between the hours of 8:00 a.m. and 4:30 p.m.  Voicemails left after 4:00 p.m. will not be returned until the following business day.  For prescription refill requests, have your pharmacy contact our office and allow 72 hours.    Cancer Center Support Programs:   > Cancer Support Group  2nd Tuesday of the month 1pm-2pm, Journey Room

## 2021-01-09 ENCOUNTER — Telehealth: Payer: Self-pay

## 2021-01-09 NOTE — Telephone Encounter (Signed)
Patient stopped by needing orders for 3 month PSA check. Going to stop by again on Tues. 01-10-2021 to see if orders are in for labs to be done.  Thanks, Helene Kelp

## 2021-01-09 NOTE — Telephone Encounter (Signed)
His PSA lab work is in there for him. It should be the June order. Thanks

## 2021-01-10 ENCOUNTER — Other Ambulatory Visit: Payer: Self-pay

## 2021-01-10 ENCOUNTER — Other Ambulatory Visit: Payer: Medicare Other

## 2021-01-10 DIAGNOSIS — C61 Malignant neoplasm of prostate: Secondary | ICD-10-CM

## 2021-01-11 ENCOUNTER — Telehealth: Payer: Self-pay

## 2021-01-11 LAB — PSA: Prostate Specific Ag, Serum: 4.4 ng/mL — ABNORMAL HIGH (ref 0.0–4.0)

## 2021-01-11 NOTE — Telephone Encounter (Signed)
Kathee Polite called and made aware.

## 2021-01-11 NOTE — Telephone Encounter (Signed)
-----   Message from Irine Seal, MD sent at 01/11/2021  8:25 AM EDT ----- Down from 5.8 to 4.4.  Fu as planned.

## 2021-01-18 NOTE — Patient Instructions (Signed)
Jerry Macdonald  01/18/2021     @PREFPERIOPPHARMACY @   Your procedure is scheduled on  01/23/2021.   Report to [name] at  0730  A.M.   Call this number if you have problems the morning of surgery:  [phone]   Remember:  Do not eat or drink after midnight.                        Take these medicines the morning of surgery with A SIP OF WATER  Proscar, protonix, flomax.   Place clean sheets on your bed the night before your procedure and DO NOT sleep with pets this night.  Shower with CHG the night before and the morning of your surgery. DO NOT use CHG on your face, hair or genitals.  After each shower, dry off with a clean towel, put on clean, comfortable clothes and brush your teeth.     Do not wear jewelry, make-up or nail polish.  Do not wear lotions, powders, or perfumes, or deodorant.  Do not shave 48 hours prior to surgery.  Men may shave face and neck.  Do not bring valuables to the hospital.  [facility] is not responsible for any belongings or valuables.  Contacts, dentures or bridgework may not be worn into surgery.  Leave your suitcase in the car.  After surgery it may be brought to your room.  For patients admitted to the hospital, discharge time will be determined by your treatment team.  Patients discharged the day of surgery will not be allowed to drive home and must have someone with them for 24 hours.   Special instructions:  DO NOT smoke tobacco or vape for 24 hours before your procedure.  Please read over the following fact sheets that you were given. Coughing and Deep Breathing, Surgical Site Infection Prevention, Anesthesia Post-op Instructions and Care and Recovery After Surgery       Thyroidectomy, Care After This sheet gives you information about how to care for yourself after your procedure. Your health care provider may also give you more specific instructions. If you have problems or questions, contact your health care  provider. What can I expect after the procedure? After the procedure, it is common to have:  Mild pain in the neck or upper body, especially when swallowing.  A swollen neck.  A sore throat.  A weak or hoarse voice.  Slight tingling or numbness around your mouth, or in your fingers or toes. This may last for a day or two after surgery. This condition is caused by low levels of calcium. You may be given calcium supplements to treat it. Follow these instructions at home: Medicines  Take over-the-counter and prescription medicines only as told by your health care provider.  Do not drive or use heavy machinery while taking prescription pain medicine.  Do not take medicines that contain aspirin and ibuprofen until your health care provider says that you can. These medicines can increase your risk of bleeding.  Take a thyroid hormone medicine as recommended by your health care provider. You will have to take this medicine for the rest of your life if your entire thyroid was removed. Eating and drinking  Start slowly with eating. You may need to have only liquids and soft foods for a few days or as directed by your health care provider.  To prevent or treat constipation while you are taking prescription pain medicine, your health  care provider may recommend that you: ? Drink enough fluid to keep your urine pale yellow. ? Take over-the-counter or prescription medicines. ? Eat foods that are high in fiber, such as fresh fruits and vegetables, whole grains, and beans. ? Limit foods that are high in fat and processed sugars, such as fried and sweet foods. Incision care  Follow instructions from your health care provider about how to take care of your incision. Make sure you: ? Wash your hands with soap and water before you change your bandage (dressing). If soap and water are not available, use hand sanitizer. ? Change your dressing as told by your health care provider. ? Leave stitches  (sutures), skin glue, or adhesive strips in place. These skin closures may need to stay in place for 2 weeks or longer. If adhesive strip edges start to loosen and curl up, you may trim the loose edges. Do not remove adhesive strips completely unless your health care provider tells you to do that.  Check your incision area every day for signs of infection. Check for: ? Redness, swelling, or pain. ? Fluid or blood. ? Warmth. ? Pus or a bad smell.  Do not take baths, swim, or use a hot tub until your health care provider approves. Activity  For the first 10 days after the procedure or as instructed by your health care provider: ? Do not lift anything that is heavier than 10 lb (4.5 kg). ? Do not jog, swim, or do other strenuous exercises. ? Do not play contact sports.  Avoid sitting for a long time without moving. Get up to take short walks every 1-2 hours. This is needed to improve blood flow and breathing. Ask for help if you feel weak or unsteady.  Return to your normal activities as told by your health care provider. Ask your health care provider what activities are safe for you. General instructions  Do not use any products that contain nicotine or tobacco, such as cigarettes and e-cigarettes. These can delay healing after surgery. If you need help quitting, ask your health care provider.  Keep all follow-up visits as told by your health care provider. This is important. Your health care provider needs to monitor the calcium level in your blood to make sure that it does not become low.   Contact a health care provider if you:  Have a fever.  Have more redness, swelling, or pain around your incision area.  Have fluid or blood coming from your incision area.  Notice that your incision area feels warm to the touch.  Have pus or a bad smell coming from your incision area.  Have trouble talking.  Have nausea or vomiting for more than 2 days. Get help right away if you:  Have  trouble breathing.  Have trouble swallowing.  Develop a rash.  Develop a cough that gets worse.  Notice that your speech changes, or you have hoarseness that gets worse.  Develop numbness, tingling, or muscle spasms in the arms, hands, feet, or face. Summary  After the procedure, it is common to feel mild pain in the neck or upper body, especially when swallowing.  Take medicines as told by your health care provider. These include pain medicines and thyroid hormones, if required.  Follow instructions from your health care provider about how to take care of your incision. Watch for signs of infection.  Keep all follow-up visits as told by your health care provider. This is important. Your health care provider  needs to monitor the calcium level in your blood to make sure that it does not become low.  Get help right away if you develop difficulty breathing, or numbness, tingling, or muscle spasms in the arms, hands, feet, or face. This information is not intended to replace advice given to you by your health care provider. Make sure you discuss any questions you have with your health care provider. Document Revised: 04/28/2020 Document Reviewed: 04/28/2020 Elsevier Patient Education  [place] Anesthesia, Adult, Care After This sheet gives you information about how to care for yourself after your procedure. Your health care provider may also give you more specific instructions. If you have problems or questions, contact your health care provider. What can I expect after the procedure? After the procedure, the following side effects are common:  Pain or discomfort at the IV site.  Nausea.  Vomiting.  Sore throat.  Trouble concentrating.  Feeling cold or chills.  Feeling weak or tired.  Sleepiness and fatigue.  Soreness and body aches. These side effects can affect parts of the body that were not involved in surgery. Follow these instructions at home: For  the time period you were told by your health care provider:  Rest.  Do not participate in activities where you could fall or become injured.  Do not drive or use machinery.  Do not drink alcohol.  Do not take sleeping pills or medicines that cause drowsiness.  Do not make important decisions or sign legal documents.  Do not take care of children on your own.   Eating and drinking  Follow any instructions from your health care provider about eating or drinking restrictions.  When you feel hungry, start by eating small amounts of foods that are soft and easy to digest (bland), such as toast. Gradually return to your regular diet.  Drink enough fluid to keep your urine pale yellow.  If you vomit, rehydrate by drinking water, juice, or clear broth. General instructions  If you have sleep apnea, surgery and certain medicines can increase your risk for breathing problems. Follow instructions from your health care provider about wearing your sleep device: ? Anytime you are sleeping, including during daytime naps. ? While taking prescription pain medicines, sleeping medicines, or medicines that make you drowsy.  Have a responsible adult stay with you for the time you are told. It is important to have someone help care for you until you are awake and alert.  Return to your normal activities as told by your health care provider. Ask your health care provider what activities are safe for you.  Take over-the-counter and prescription medicines only as told by your health care provider.  If you smoke, do not smoke without supervision.  Keep all follow-up visits as told by your health care provider. This is important. Contact a health care provider if:  You have nausea or vomiting that does not get better with medicine.  You cannot eat or drink without vomiting.  You have pain that does not get better with medicine.  You are unable to pass urine.  You develop a skin rash.  You  have a fever.  You have redness around your IV site that gets worse. Get help right away if:  You have difficulty breathing.  You have chest pain.  You have blood in your urine or stool, or you vomit blood. Summary  After the procedure, it is common to have a sore throat or nausea. It is also common to  feel tired.  Have a responsible adult stay with you for the time you are told. It is important to have someone help care for you until you are awake and alert.  When you feel hungry, start by eating small amounts of foods that are soft and easy to digest (bland), such as toast. Gradually return to your regular diet.  Drink enough fluid to keep your urine pale yellow.  Return to your normal activities as told by your health care provider. Ask your health care provider what activities are safe for you. This information is not intended to replace advice given to you by your health care provider. Make sure you discuss any questions you have with your health care provider. Document Revised: 05/05/2020 Document Reviewed: 12/03/2019 Elsevier Patient Education  2021 [employer].

## 2021-01-19 ENCOUNTER — Other Ambulatory Visit (HOSPITAL_COMMUNITY)
Admission: RE | Admit: 2021-01-19 | Discharge: 2021-01-19 | Disposition: A | Discharge status: Home / Self Care | Payer: Medicare Other | Source: Ambulatory Visit | Attending: General Surgery | Admitting: General Surgery

## 2021-01-19 ENCOUNTER — Encounter (HOSPITAL_COMMUNITY)
Admission: RE | Admit: 2021-01-19 | Discharge: 2021-01-19 | Disposition: A | Discharge status: Home / Self Care | Payer: Medicare Other | Source: Ambulatory Visit | Attending: General Surgery | Admitting: General Surgery

## 2021-01-19 ENCOUNTER — Encounter (HOSPITAL_COMMUNITY): Payer: Self-pay

## 2021-01-19 ENCOUNTER — Other Ambulatory Visit: Payer: Self-pay

## 2021-01-19 DIAGNOSIS — Z20822 Contact with and (suspected) exposure to covid-19: Secondary | ICD-10-CM | POA: Insufficient documentation

## 2021-01-19 DIAGNOSIS — Z01812 Encounter for preprocedural laboratory examination: Secondary | ICD-10-CM | POA: Insufficient documentation

## 2021-01-19 LAB — CBC WITH DIFFERENTIAL/PLATELET
Abs Immature Granulocytes: 0.02 10*3/uL (ref 0.00–0.07)
Basophils Absolute: 0 10*3/uL (ref 0.0–0.1)
Basophils Relative: 0 %
Eosinophils Absolute: 0.1 10*3/uL (ref 0.0–0.5)
Eosinophils Relative: 2 %
HCT: 37.5 % — ABNORMAL LOW (ref 39.0–52.0)
Hemoglobin: 11.7 g/dL — ABNORMAL LOW (ref 13.0–17.0)
Immature Granulocytes: 0 %
Lymphocytes Relative: 26 %
Lymphs Abs: 1.6 10*3/uL (ref 0.7–4.0)
MCH: 28 pg (ref 26.0–34.0)
MCHC: 31.2 g/dL (ref 30.0–36.0)
MCV: 89.7 fL (ref 80.0–100.0)
Monocytes Absolute: 0.8 10*3/uL (ref 0.1–1.0)
Monocytes Relative: 12 %
Neutro Abs: 3.8 10*3/uL (ref 1.7–7.7)
Neutrophils Relative %: 60 %
Platelets: 193 10*3/uL (ref 150–400)
RBC: 4.18 MIL/uL — ABNORMAL LOW (ref 4.22–5.81)
RDW: 19.7 % — ABNORMAL HIGH (ref 11.5–15.5)
WBC: 6.3 10*3/uL (ref 4.0–10.5)
nRBC: 0 % (ref 0.0–0.2)

## 2021-01-19 LAB — COMPREHENSIVE METABOLIC PANEL
ALT: 16 U/L (ref 0–44)
AST: 22 U/L (ref 15–41)
Albumin: 3.9 g/dL (ref 3.5–5.0)
Alkaline Phosphatase: 56 U/L (ref 38–126)
Anion gap: 6 (ref 5–15)
BUN: 21 mg/dL (ref 8–23)
CO2: 27 mmol/L (ref 22–32)
Calcium: 8.9 mg/dL (ref 8.9–10.3)
Chloride: 105 mmol/L (ref 98–111)
Creatinine, Ser: 0.94 mg/dL (ref 0.61–1.24)
GFR, Estimated: >60 mL/min (ref >60)
Glucose, Bld: 111 mg/dL — ABNORMAL HIGH (ref 70–99)
Potassium: 4.3 mmol/L (ref 3.5–5.1)
Sodium: 138 mmol/L (ref 135–145)
Total Bilirubin: 1.5 mg/dL — ABNORMAL HIGH (ref 0.3–1.2)
Total Protein: 6.8 g/dL (ref 6.5–8.1)

## 2021-01-19 LAB — TSH: TSH: 0.103 u[IU]/mL — ABNORMAL LOW (ref 0.350–4.500)

## 2021-01-20 LAB — SARS CORONAVIRUS 2 (TAT 6-24 HRS): SARS Coronavirus 2: NEGATIVE

## 2021-01-23 ENCOUNTER — Other Ambulatory Visit: Payer: Self-pay

## 2021-01-23 ENCOUNTER — Ambulatory Visit (HOSPITAL_COMMUNITY): Payer: Medicare Other

## 2021-01-23 ENCOUNTER — Ambulatory Visit (HOSPITAL_COMMUNITY): Payer: Medicare Other | Admitting: Anesthesiology

## 2021-01-23 ENCOUNTER — Encounter (HOSPITAL_COMMUNITY)
Admission: RE | Disposition: A | Discharge status: Home / Self Care | Payer: Self-pay | Source: Home / Self Care | Attending: General Surgery

## 2021-01-23 ENCOUNTER — Observation Stay (HOSPITAL_COMMUNITY)
Admission: RE | Admit: 2021-01-23 | Discharge: 2021-01-24 | Disposition: A | Discharge status: Home / Self Care | Payer: Medicare Other | Attending: General Surgery | Admitting: General Surgery

## 2021-01-23 ENCOUNTER — Encounter (HOSPITAL_COMMUNITY): Payer: Self-pay | Admitting: General Surgery

## 2021-01-23 DIAGNOSIS — E042 Nontoxic multinodular goiter: Principal | ICD-10-CM

## 2021-01-23 DIAGNOSIS — E041 Nontoxic single thyroid nodule: Secondary | ICD-10-CM | POA: Diagnosis not present

## 2021-01-23 DIAGNOSIS — I4510 Unspecified right bundle-branch block: Secondary | ICD-10-CM | POA: Diagnosis not present

## 2021-01-23 DIAGNOSIS — Z87891 Personal history of nicotine dependence: Secondary | ICD-10-CM | POA: Diagnosis not present

## 2021-01-23 DIAGNOSIS — J9811 Atelectasis: Secondary | ICD-10-CM | POA: Diagnosis not present

## 2021-01-23 DIAGNOSIS — E890 Postprocedural hypothyroidism: Secondary | ICD-10-CM

## 2021-01-23 HISTORY — PX: THYROIDECTOMY: SHX17

## 2021-01-23 LAB — GLUCOSE, CAPILLARY: Glucose-Capillary: 165 mg/dL — ABNORMAL HIGH (ref 70–99)

## 2021-01-23 SURGERY — THYROIDECTOMY
Anesthesia: General

## 2021-01-23 MED ORDER — LIDOCAINE HCL (PF) 2 % IJ SOLN
INTRAMUSCULAR | Status: AC
  Filled 2021-01-23: qty 10

## 2021-01-23 MED ORDER — ONDANSETRON HCL 4 MG/2ML IJ SOLN
INTRAMUSCULAR | Status: DC | PRN
  Administered 2021-01-23: 4 mg via INTRAVENOUS

## 2021-01-23 MED ORDER — TRAMADOL HCL 50 MG PO TABS
50.0000 mg | ORAL_TABLET | Freq: Four times a day (QID) | ORAL | Status: DC | PRN
Start: 2021-01-23 — End: 2021-01-24

## 2021-01-23 MED ORDER — DIPHENHYDRAMINE HCL 12.5 MG/5ML PO ELIX: 12.5000 mg | ORAL_SOLUTION | Freq: Four times a day (QID) | ORAL | Status: DC | PRN

## 2021-01-23 MED ORDER — SODIUM CHLORIDE 0.9 % IR SOLN
Status: DC | PRN
  Administered 2021-01-23: 1000 mL

## 2021-01-23 MED ORDER — CHLORHEXIDINE GLUCONATE CLOTH 2 % EX PADS: 6.0000 | MEDICATED_PAD | Freq: Once | CUTANEOUS | Status: DC

## 2021-01-23 MED ORDER — CALCIUM CARBONATE-VITAMIN D 500-200 MG-UNIT PO TABS
1.0000 | ORAL_TABLET | Freq: Three times a day (TID) | ORAL | Status: DC
  Administered 2021-01-23 – 2021-01-24 (×3): 1 via ORAL
  Filled 2021-01-23 (×3): qty 1

## 2021-01-23 MED ORDER — LEVOTHYROXINE SODIUM 75 MCG PO TABS
75.0000 ug | ORAL_TABLET | Freq: Every day | ORAL | Status: DC
  Administered 2021-01-24: 75 ug via ORAL
  Filled 2021-01-23: qty 1

## 2021-01-23 MED ORDER — DOCUSATE SODIUM 100 MG PO CAPS
100.0000 mg | ORAL_CAPSULE | Freq: Two times a day (BID) | ORAL | Status: DC
  Administered 2021-01-23 – 2021-01-24 (×2): 100 mg via ORAL
  Filled 2021-01-23 (×2): qty 1

## 2021-01-23 MED ORDER — DIPHENHYDRAMINE HCL 50 MG/ML IJ SOLN: 12.5000 mg | Freq: Four times a day (QID) | INTRAMUSCULAR | Status: DC | PRN

## 2021-01-23 MED ORDER — DEXAMETHASONE SODIUM PHOSPHATE 10 MG/ML IJ SOLN
INTRAMUSCULAR | Status: DC | PRN
  Administered 2021-01-23: 10 mg via INTRAVENOUS

## 2021-01-23 MED ORDER — FENTANYL CITRATE (PF) 250 MCG/5ML IJ SOLN
INTRAMUSCULAR | Status: AC
  Filled 2021-01-23: qty 5

## 2021-01-23 MED ORDER — ONDANSETRON 4 MG PO TBDP: 4.0000 mg | ORAL_TABLET | Freq: Four times a day (QID) | ORAL | Status: DC | PRN

## 2021-01-23 MED ORDER — SIMETHICONE 80 MG PO CHEW: 40.0000 mg | CHEWABLE_TABLET | Freq: Four times a day (QID) | ORAL | Status: DC | PRN

## 2021-01-23 MED ORDER — TAMSULOSIN HCL 0.4 MG PO CAPS
0.4000 mg | ORAL_CAPSULE | Freq: Every day | ORAL | Status: DC
  Administered 2021-01-23: 0.4 mg via ORAL
  Filled 2021-01-23 (×2): qty 1

## 2021-01-23 MED ORDER — SUCCINYLCHOLINE CHLORIDE 20 MG/ML IJ SOLN
INTRAMUSCULAR | Status: DC | PRN
  Administered 2021-01-23: 140 mg via INTRAVENOUS

## 2021-01-23 MED ORDER — LACTATED RINGERS IV SOLN: INTRAVENOUS | Status: DC

## 2021-01-23 MED ORDER — ONDANSETRON HCL 4 MG/2ML IJ SOLN: 4.0000 mg | Freq: Once | INTRAMUSCULAR | Status: DC | PRN

## 2021-01-23 MED ORDER — LIDOCAINE HCL (CARDIAC) PF 100 MG/5ML IV SOSY
PREFILLED_SYRINGE | INTRAVENOUS | Status: DC | PRN
  Administered 2021-01-23 (×2): 100 mg via INTRAVENOUS

## 2021-01-23 MED ORDER — BUPIVACAINE HCL (PF) 0.5 % IJ SOLN
INTRAMUSCULAR | Status: DC | PRN
  Administered 2021-01-23: 10 mL

## 2021-01-23 MED ORDER — MORPHINE SULFATE (PF) 2 MG/ML IV SOLN: 2.0000 mg | INTRAVENOUS | Status: DC | PRN

## 2021-01-23 MED ORDER — PROPOFOL 10 MG/ML IV BOLUS
INTRAVENOUS | Status: DC | PRN
  Administered 2021-01-23: 140 mg via INTRAVENOUS
  Administered 2021-01-23: 40 mg via INTRAVENOUS

## 2021-01-23 MED ORDER — PROPOFOL 10 MG/ML IV BOLUS
INTRAVENOUS | Status: AC
  Filled 2021-01-23: qty 20

## 2021-01-23 MED ORDER — ONDANSETRON HCL 4 MG/2ML IJ SOLN: 4.0000 mg | Freq: Four times a day (QID) | INTRAMUSCULAR | Status: DC | PRN

## 2021-01-23 MED ORDER — CHLORHEXIDINE GLUCONATE 0.12 % MT SOLN: 15.0000 mL | Freq: Once | OROMUCOSAL | Status: AC

## 2021-01-23 MED ORDER — ROCURONIUM BROMIDE 10 MG/ML (PF) SYRINGE
PREFILLED_SYRINGE | INTRAVENOUS | Status: AC
  Filled 2021-01-23: qty 30

## 2021-01-23 MED ORDER — PANTOPRAZOLE SODIUM 40 MG PO TBEC
40.0000 mg | DELAYED_RELEASE_TABLET | Freq: Two times a day (BID) | ORAL | Status: DC
  Administered 2021-01-23 – 2021-01-24 (×2): 40 mg via ORAL
  Filled 2021-01-23 (×2): qty 1

## 2021-01-23 MED ORDER — LIDOCAINE HCL (PF) 2 % IJ SOLN
INTRAMUSCULAR | Status: AC
  Filled 2021-01-23: qty 5

## 2021-01-23 MED ORDER — HEMOSTATIC AGENTS (NO CHARGE) OPTIME
TOPICAL | Status: DC | PRN
  Administered 2021-01-23 (×2): 1 via TOPICAL

## 2021-01-23 MED ORDER — FENTANYL CITRATE (PF) 100 MCG/2ML IJ SOLN
INTRAMUSCULAR | Status: AC
  Filled 2021-01-23: qty 2

## 2021-01-23 MED ORDER — BUPIVACAINE HCL (PF) 0.5 % IJ SOLN
INTRAMUSCULAR | Status: AC
  Filled 2021-01-23: qty 30

## 2021-01-23 MED ORDER — HYDROMORPHONE HCL 1 MG/ML IJ SOLN: 0.2500 mg | INTRAMUSCULAR | Status: DC | PRN

## 2021-01-23 MED ORDER — OXYCODONE HCL 5 MG PO TABS: 5.0000 mg | ORAL_TABLET | ORAL | Status: DC | PRN

## 2021-01-23 MED ORDER — ORAL CARE MOUTH RINSE
15.0000 mL | Freq: Once | OROMUCOSAL | Status: AC
  Administered 2021-01-23: 15 mL via OROMUCOSAL

## 2021-01-23 MED ORDER — FENTANYL CITRATE (PF) 100 MCG/2ML IJ SOLN
INTRAMUSCULAR | Status: DC | PRN
  Administered 2021-01-23: 100 ug via INTRAVENOUS
  Administered 2021-01-23 (×7): 25 ug via INTRAVENOUS
  Administered 2021-01-23: 50 ug via INTRAVENOUS
  Administered 2021-01-23: 25 ug via INTRAVENOUS

## 2021-01-23 MED ORDER — FINASTERIDE 5 MG PO TABS
5.0000 mg | ORAL_TABLET | Freq: Every day | ORAL | Status: DC
  Administered 2021-01-23: 5 mg via ORAL
  Filled 2021-01-23 (×2): qty 1

## 2021-01-23 MED ORDER — ROCURONIUM BROMIDE 10 MG/ML (PF) SYRINGE
PREFILLED_SYRINGE | INTRAVENOUS | Status: AC
  Filled 2021-01-23: qty 10

## 2021-01-23 MED ORDER — ACETAMINOPHEN 500 MG PO TABS
1000.0000 mg | ORAL_TABLET | Freq: Four times a day (QID) | ORAL | Status: DC
  Administered 2021-01-23 – 2021-01-24 (×3): 1000 mg via ORAL
  Filled 2021-01-23 (×3): qty 2

## 2021-01-23 MED ORDER — ROCURONIUM BROMIDE 10 MG/ML (PF) SYRINGE
PREFILLED_SYRINGE | INTRAVENOUS | Status: DC | PRN
  Administered 2021-01-23: 10 mg via INTRAVENOUS
  Administered 2021-01-23: 20 mg via INTRAVENOUS
  Administered 2021-01-23 (×2): 10 mg via INTRAVENOUS
  Administered 2021-01-23: 60 mg via INTRAVENOUS

## 2021-01-23 MED ORDER — SUGAMMADEX SODIUM 200 MG/2ML IV SOLN
INTRAVENOUS | Status: DC | PRN
  Administered 2021-01-23: 200 mg via INTRAVENOUS

## 2021-01-23 MED ORDER — ONDANSETRON HCL 4 MG/2ML IJ SOLN
INTRAMUSCULAR | Status: AC
  Filled 2021-01-23: qty 2

## 2021-01-23 MED ORDER — METOPROLOL TARTRATE 5 MG/5ML IV SOLN: 5.0000 mg | Freq: Four times a day (QID) | INTRAVENOUS | Status: DC | PRN

## 2021-01-23 SURGICAL SUPPLY — 57 items
ADH SKN CLS APL DERMABOND .7 (GAUZE/BANDAGES/DRESSINGS) ×1
APL PRP STRL LF ISPRP CHG 10.5 (MISCELLANEOUS) ×1
APPLICATOR CHLORAPREP 10.5 ORG (MISCELLANEOUS) ×2 IMPLANT
APPLIER CLIP 9.375 SM OPEN (CLIP) ×2
APR CLP SM 9.3 20 MLT OPN (CLIP) ×1
ATTRACTOMAT 16X20 MAGNETIC DRP (DRAPES) ×2 IMPLANT
BLADE SURG 15 STRL LF DISP TIS (BLADE) ×1 IMPLANT
BLADE SURG 15 STRL SS (BLADE) ×2
BLADE SURG SZ10 CARB STEEL (BLADE) ×2 IMPLANT
CLIP APPLIE 9.375 SM OPEN (CLIP) ×1 IMPLANT
CLOTH BEACON ORANGE TIMEOUT ST (SAFETY) ×2 IMPLANT
COVER LIGHT HANDLE STERIS (MISCELLANEOUS) ×4 IMPLANT
COVER WAND RF STERILE (DRAPES) ×2 IMPLANT
DECANTER SPIKE VIAL GLASS SM (MISCELLANEOUS) ×2 IMPLANT
DERMABOND ADVANCED (GAUZE/BANDAGES/DRESSINGS) ×1
DERMABOND ADVANCED .7 DNX12 (GAUZE/BANDAGES/DRESSINGS) ×1 IMPLANT
DRAPE HALF SHEET 40X57 (DRAPES) ×2 IMPLANT
DRAPE LAPAROTOMY 77X122 PED (DRAPES) ×2 IMPLANT
ELECT NEEDLE TIP 2.8 STRL (NEEDLE) ×2 IMPLANT
ELECT REM PT RETURN 9FT ADLT (ELECTROSURGICAL) ×2
ELECTRODE REM PT RTRN 9FT ADLT (ELECTROSURGICAL) ×1 IMPLANT
GAUZE 4X4 16PLY RFD (DISPOSABLE) ×8 IMPLANT
GLOVE BIOGEL M 6.5 STRL (GLOVE) ×2 IMPLANT
GLOVE SURG SS PI 7.5 STRL IVOR (GLOVE) ×2 IMPLANT
GLOVE SURG UNDER POLY LF SZ6.5 (GLOVE) ×2 IMPLANT
GLOVE SURG UNDER POLY LF SZ7 (GLOVE) ×4 IMPLANT
GOWN STRL REUS W/TWL LRG LVL3 (GOWN DISPOSABLE) ×6 IMPLANT
HEMOSTAT ARISTA ABSORB 3G PWDR (HEMOSTASIS) ×2 IMPLANT
HEMOSTAT SURGICEL 4X8 (HEMOSTASIS) ×2 IMPLANT
KIT BLADEGUARD II DBL (SET/KITS/TRAYS/PACK) ×2 IMPLANT
KIT TURNOVER KIT A (KITS) ×2 IMPLANT
MANIFOLD NEPTUNE II (INSTRUMENTS) ×2 IMPLANT
MARKER SKIN DUAL TIP RULER LAB (MISCELLANEOUS) ×2 IMPLANT
NEEDLE HYPO 25X1 1.5 SAFETY (NEEDLE) ×2 IMPLANT
NS IRRIG 1000ML POUR BTL (IV SOLUTION) ×2 IMPLANT
PACK BASIC III (CUSTOM PROCEDURE TRAY) ×2
PACK SRG BSC III STRL LF ECLPS (CUSTOM PROCEDURE TRAY) ×1 IMPLANT
PAD ARMBOARD 7.5X6 YLW CONV (MISCELLANEOUS) ×2 IMPLANT
PENCIL SMOKE EVACUATOR COATED (MISCELLANEOUS) ×2 IMPLANT
SET BASIN LINEN APH (SET/KITS/TRAYS/PACK) ×2 IMPLANT
SHEARS HARMONIC 9CM CVD (BLADE) ×2 IMPLANT
SPONGE DRAIN TRACH 4X4 STRL 2S (GAUZE/BANDAGES/DRESSINGS) ×2 IMPLANT
SPONGE INTESTINAL PEANUT (DISPOSABLE) ×32 IMPLANT
SUT ETHILON 4 0 PS 2 18 (SUTURE) ×2 IMPLANT
SUT MNCRL AB 4-0 PS2 18 (SUTURE) ×2 IMPLANT
SUT SILK 2 0 (SUTURE) ×4
SUT SILK 2-0 18XBRD TIE 12 (SUTURE) ×2 IMPLANT
SUT SILK 3 0 (SUTURE) ×2
SUT SILK 3-0 18XBRD TIE 12 (SUTURE) ×1 IMPLANT
SUT VIC AB 2-0 CT2 27 (SUTURE) ×2 IMPLANT
SUT VIC AB 3-0 SH 27 (SUTURE) ×2
SUT VIC AB 3-0 SH 27X BRD (SUTURE) ×1 IMPLANT
SYR CONTROL 10ML LL (SYRINGE) ×2 IMPLANT
SYSTEM CHEST DRAIN TLS 7FR (DRAIN) ×2 IMPLANT
TAPE SURG TRANSPORE 1 IN (GAUZE/BANDAGES/DRESSINGS) ×1 IMPLANT
TAPE SURGICAL TRANSPORE 1 IN (GAUZE/BANDAGES/DRESSINGS) ×2
TOWEL OR 17X26 4PK STRL BLUE (TOWEL DISPOSABLE) ×2 IMPLANT

## 2021-01-23 NOTE — Anesthesia Procedure Notes (Signed)
Procedure Name: Intubation Performed by: Tacy Learn, CRNA Pre-anesthesia Checklist: Patient identified, Emergency Drugs available, Suction available, Patient being monitored and Timeout performed Patient Re-evaluated:Patient Re-evaluated prior to induction Oxygen Delivery Method: Circle system utilized Preoxygenation: Pre-oxygenation with 100% oxygen Induction Type: IV induction Laryngoscope Size: Miller and 3 Grade View: Grade II Tube type: Oral Tube size: 8.0 mm Number of attempts: 1 Airway Equipment and Method: Stylet Placement Confirmation: ETT inserted through vocal cords under direct vision,  positive ETCO2,  CO2 detector and breath sounds checked- equal and bilateral Secured at: 23 cm Tube secured with: Tape Dental Injury: Teeth and Oropharynx as per pre-operative assessment

## 2021-01-23 NOTE — Op Note (Signed)
[facility] Operative Note  01/23/21  Preoperative Diagnosis:  Multinodular goiter    Postoperative Diagnosis: Same   Procedure(s) Performed:  Total thyroidectomy    Surgeon: Lanell Matar. Constance Haw, MD   Assistants: Aviva Signs, MD   Anesthesia: General endotracheal   Anesthesiologist: Dr. Charna Elizabeth    Specimens: Left thyroid lobe, Right thyroid and Isthmus   Estimated Blood Loss: Minimal   Blood Replacement: None    Complications: None   Wound Class: Clean    Operative Indications: Mr. Lorimer is an 87 yo who has a multinodular goiter that has had Affirma testing and was unlikely to be cancer but did have some progressive complaints of compressive type symptoms and difficulty swallowing. He felt like his neck was getting more full with time. We discussed thyroidectomy and risk of bleeding, infection, cancer, parathyroid injury, needing medications for life, injury to the recurrent laryngeal nerve, and he opted to proceed.   Findings: Large multinodular thyroid lobes    Procedure: The patient was taken to the operating room and placed supine. General endotracheal anesthesia was induced. Intravenous antibiotics were not administered as this was a clean case.   Both arms were tucked and bony prominences were padded. A roll was placed under the shoulders for extension of the neck.  The neck was marked to ensure a symmetrical incision. The neck was prepped and draped in the normal sterile fashion.  A JACHO approved time out was performed.    An incision was made two fingerbreadth above the sternal notch in a line I drew to mimic his skin creases as he did not have a skin crease that was obvious in this area.  Subcutaneous vessels were tied off with 2-0 silk ties as they were prominent in nature.  The subcutaneous tissue and platysma were divided with electrocautery.  Subplatysmal flaps were raised superiorly to the thyroid cartilage and inferiorly to the sternal notch.   The anterior jugular veins were very prominent and crossed midline several places. These were dissected and tied off with 2-0 Silk suture and ligated.  The strap muscles were then divided in the midline and retracted laterally.   There was a capsule over the thyroid that was very vascular. Given the size of the right thyroid lobe, we started on the left and the left lobe was mobilized up with blunt and electrocautery dissection from its areolar attachments. The thyroid was very deep and the lower lobe was larger than the superior lobe.  The superior pole vessels were taken first and ligated with a 2-0 Silk suture and Harmonic scalpel.  Care was taken to not injure any nerve branches and we stayed closed to the thyroid. The parathyroid was noted and preserved.  The middle thyroid vein was then accessible and was ligated with a 2-0 silk suture and Harmonic scalpel.  With the help of a Kary Kos the thyroid was retracted medially and the muscles were retracted laterally. The inferior thyroid artery was identified and ligated with a 2-0 silk suture and the inferior parathyroid was noted and preserved.  We identified the recurrent laryngeal deep in the tracheoesophageal grove and stayed clear of it , dissecting the remaining thyroid off the medial attachments to the trachea at the ligament of Berry.  Given the size, we divided the left thyroid lobe at the Isthmus and passed it off the field.  Gauze was packed into the left space.   A similar procedure was carried out on the right side which was substantially larger in  the inferior lobe. We did not identify the right recurrent laryngeal nerve but continued our dissection high and close to the thyroid to avoid injury. The superior parathyroid was identified and preserved, and we thought we saw the inferior parathyroid but due to the depth of the inferior lobe we did not want to dissect to much as we were getting in close proximity to the pleura.  Once the thyroid was  lifted up on a pedicle on the trachea we continued the dissection on the trachea with electrocautery freeing the attachments of the Isthmus from the trachea.  The right lobe and Isthmus were passed off.  Gauze was packed into the space.  We visualized the space around the left side of the trachea and valsalva was performed and hemostasis was confirmed. The recurrent laryngeal was deep in the grove and preserved.  We visualized the space to the right of the trachea and valsalva was performed.  Hemostasis was achieved. Arista and Surgicel were placed on both sides. A drain was placed into the deep space and brought out between the Strap muscles which were re-approximated with interrupted 2-0 Vicryl suture.  The platysma was re-approximated with a running 3-0 Vicryl suture.  Local was injected.  The drain was brought out on the left side and secured with a 3-0 Nylon suture.  The skin was closed with a subcuticular 4-0 Monocryl running suture and dermabond. A drain sponge was placed.    Dr. Arnoldo Morale was assisting throughout the procedure and was present for the critical portions of the case.   Final inspection revealed acceptable hemostasis. All counts were correct at the end of the case. The patient was awakened from anesthesia and extubated without complication.  The patient went to the PACU in stable condition.  He was able to phonate "ee" after the procedure.   Curlene Labrum, MD [facility] [address] [place], [place] [address] [phone] (office)

## 2021-01-23 NOTE — Progress Notes (Signed)
[facility]  Checked on patient. No issues. Some hoarseness but patient and friend says sounds normal to them. CPAP from home. Will order. PTH at 330pm. No hematoma and minimal in drain.   Curlene Labrum, MD [facility] [address] [place], [place] [address] [phone] (office)

## 2021-01-23 NOTE — Transfer of Care (Signed)
Immediate Anesthesia Transfer of Care Note  Patient: Jerry Macdonald  Procedure(s) Performed: TOTAL THYROIDECTOMY (N/A )  Patient Location: PACU  Anesthesia Type:General  Level of Consciousness: awake, drowsy, patient cooperative and responds to stimulation  Airway & Oxygen Therapy: Patient Spontanous Breathing and Patient connected to nasal cannula oxygen  Post-op Assessment: Report given to RN, Post -op Vital signs reviewed and stable and Patient moving all extremities  Post vital signs: Reviewed and stable  Last Vitals:  Vitals Value Taken Time  BP 145/76 01/23/21 1128  Temp    Pulse 71 01/23/21 1130  Resp 13 01/23/21 1130  SpO2 91 % 01/23/21 1130  Vitals shown include unvalidated device data.  Last Pain:  Vitals:   01/23/21 0756  TempSrc: Oral  PainSc: 0-No pain         Complications: No complications documented.

## 2021-01-23 NOTE — Anesthesia Preprocedure Evaluation (Addendum)
Anesthesia Evaluation  Patient identified by MRN, date of birth, ID band Patient awake    Reviewed: Allergy & Precautions, NPO status , Patient's Chart, lab work & pertinent test results  History of Anesthesia Complications Negative for: history of anesthetic complications  Airway Mallampati: III  TM Distance: >3 FB Neck ROM: Full    Dental  (+) Dental Advisory Given, Missing   Pulmonary sleep apnea and Continuous Positive Airway Pressure Ventilation , former smoker,    Pulmonary exam normal breath sounds clear to auscultation       Cardiovascular Exercise Tolerance: Good Normal cardiovascular exam Rhythm:Regular Rate:Normal  12-Aug-2020 11:03:49 [facility]-AP-OPS ROUTINE RECORD Sinus rhythm Left axis deviation Right bundle branch block Abnormal ECG Confirmed by Asencion Noble [phone]) on 08/14/2020 10:20:22 AM   Neuro/Psych negative neurological ROS  negative psych ROS   GI/Hepatic Neg liver ROS, GERD  Medicated and Controlled,Colon cancer    Endo/Other  negative endocrine ROS  Renal/GU negative Renal ROS     Musculoskeletal negative musculoskeletal ROS (+)   Abdominal   Peds  Hematology negative hematology ROS (+)   Anesthesia Other Findings   Reproductive/Obstetrics negative OB ROS                            Anesthesia Physical Anesthesia Plan  ASA: III  Anesthesia Plan: General   Post-op Pain Management:    Induction: Intravenous  PONV Risk Score and Plan: 3 and Ondansetron  Airway Management Planned: Oral ETT  Additional Equipment:   Intra-op Plan:   Post-operative Plan: Extubation in OR  Informed Consent: I have reviewed the patients History and Physical, chart, labs and discussed the procedure including the risks, benefits and alternatives for the proposed anesthesia with the patient or authorized representative who has indicated his/her understanding and  acceptance.     Dental advisory given  Plan Discussed with: CRNA and Surgeon  Anesthesia Plan Comments:         Anesthesia Quick Evaluation

## 2021-01-23 NOTE — Anesthesia Postprocedure Evaluation (Signed)
Anesthesia Post Note  Patient: Jerry Macdonald  Procedure(s) Performed: TOTAL THYROIDECTOMY (N/A )  Patient location during evaluation: PACU Anesthesia Type: General Level of consciousness: awake and alert and oriented Pain management: pain level controlled Vital Signs Assessment: post-procedure vital signs reviewed and stable Respiratory status: spontaneous breathing and respiratory function stable Cardiovascular status: stable Postop Assessment: no apparent nausea or vomiting Anesthetic complications: no   No complications documented.   Last Vitals:  Vitals:   01/23/21 1200 01/23/21 1215  BP: (!) 155/80 (!) 151/78  Pulse: 70 68  Resp: 19 (!) 23  Temp:    SpO2: 97% 95%    Last Pain:  Vitals:   01/23/21 1200  TempSrc:   PainSc: 0-No pain                 Tyarra Nolton C Tauriel Scronce

## 2021-01-23 NOTE — Progress Notes (Signed)
[facility]  CXR good. PTH and Calcium at 1530. Oscal D and Synthroid ordered post op.  Curlene Labrum, MD [facility] [address] [place], [place] [address] [phone] (office)

## 2021-01-23 NOTE — Interval H&P Note (Signed)
History and Physical Interval Note:  01/23/2021 8:18 AM  [name]  has presented today for surgery, with the diagnosis of Multi-nodular goiter.  The various methods of treatment have been discussed with the patient and family. After consideration of risks, benefits and other options for treatment, the patient has consented to  Procedure(s): THYROIDECTOMY/TOTAL (N/A) as a surgical intervention.  The patient's history has been reviewed, patient examined, no change in status, stable for surgery.  I have reviewed the patient's chart and labs.  Questions were answered to the patient's satisfaction.    Doing well. No changes.  Virl Cagey

## 2021-01-23 NOTE — Progress Notes (Signed)
[facility]  Updated Jerry Macdonald that surgery completed. Overnight stay. Drain tube in place, monitor. Able to phonate "ee" in PACU.  Curlene Labrum, MD [facility] [address] [place], [place] [address] [phone] (office)

## 2021-01-23 NOTE — Plan of Care (Signed)

## 2021-01-24 ENCOUNTER — Encounter (HOSPITAL_COMMUNITY): Payer: Self-pay | Admitting: General Surgery

## 2021-01-24 DIAGNOSIS — Z87891 Personal history of nicotine dependence: Secondary | ICD-10-CM | POA: Diagnosis not present

## 2021-01-24 DIAGNOSIS — E042 Nontoxic multinodular goiter: Secondary | ICD-10-CM | POA: Diagnosis not present

## 2021-01-24 LAB — BASIC METABOLIC PANEL
Anion gap: 6 (ref 5–15)
BUN: 17 mg/dL (ref 8–23)
CO2: 28 mmol/L (ref 22–32)
Calcium: 8.7 mg/dL — ABNORMAL LOW (ref 8.9–10.3)
Chloride: 102 mmol/L (ref 98–111)
Creatinine, Ser: 0.7 mg/dL (ref 0.61–1.24)
GFR, Estimated: >60 mL/min (ref >60)
Glucose, Bld: 123 mg/dL — ABNORMAL HIGH (ref 70–99)
Potassium: 4.1 mmol/L (ref 3.5–5.1)
Sodium: 136 mmol/L (ref 135–145)

## 2021-01-24 LAB — PTH, INTACT AND CALCIUM
Calcium, Total (PTH): 8.6 mg/dL (ref 8.6–10.2)
PTH: 25 pg/mL (ref 15–65)

## 2021-01-24 LAB — CBC
HCT: 34.5 % — ABNORMAL LOW (ref 39.0–52.0)
Hemoglobin: 10.7 g/dL — ABNORMAL LOW (ref 13.0–17.0)
MCH: 28.2 pg (ref 26.0–34.0)
MCHC: 31 g/dL (ref 30.0–36.0)
MCV: 90.8 fL (ref 80.0–100.0)
Platelets: 185 10*3/uL (ref 150–400)
RBC: 3.8 MIL/uL — ABNORMAL LOW (ref 4.22–5.81)
RDW: 19.6 % — ABNORMAL HIGH (ref 11.5–15.5)
WBC: 14 10*3/uL — ABNORMAL HIGH (ref 4.0–10.5)
nRBC: 0 % (ref 0.0–0.2)

## 2021-01-24 LAB — MAGNESIUM: Magnesium: 1.8 mg/dL (ref 1.7–2.4)

## 2021-01-24 MED ORDER — TRAMADOL HCL 50 MG PO TABS: 50.0000 mg | ORAL_TABLET | Freq: Four times a day (QID) | ORAL | 0 refills | Status: DC | PRN

## 2021-01-24 MED ORDER — CALCIUM CARBONATE-VITAMIN D 500-200 MG-UNIT PO TABS: 1.0000 | ORAL_TABLET | Freq: Three times a day (TID) | ORAL | 1 refills | Status: DC

## 2021-01-24 MED ORDER — ONDANSETRON 4 MG PO TBDP: 4.0000 mg | ORAL_TABLET | Freq: Four times a day (QID) | ORAL | 0 refills | Status: DC | PRN

## 2021-01-24 MED ORDER — LEVOTHYROXINE SODIUM 75 MCG PO TABS: 75.0000 ug | ORAL_TABLET | Freq: Every day | ORAL | 1 refills | Status: DC

## 2021-01-24 NOTE — Discharge Instructions (Signed)
Discharge Instructions:  Common Complaints: Bruising of incision site. Soreness in neck and throat. Some hoarseness.   Diet/ Activity: Diet as tolerated. You may not have an appetite, but it is important to stay hydrated. Drink 64 ounces of water a day.  Shower per your regular routine daily.   Leave the bandage in place for 48 hours and replace if saturated.  Rest and listen to your body, but do not remain in bed all day.  Walk everyday for at least 15-20 minutes. Deep cough and move around every 1-2 hours in the first few days after surgery.  Do not pick at the dermabond glue on your incision sites.  This glue film will remain in place for 1-2 weeks and will start to peel off.  Do not place lotions or balms on your incision unless instructed to specifically by Dr. Constance Haw.   Pain Expectations and Narcotics: -After surgery you will have pain associated with your incisions and this is normal. The pain is muscular and nerve pain, and will get better with time. -You are encouraged and expected to take non narcotic medications like tylenol and ibuprofen (when able) to treat pain as multiple modalities can aid with pain treatment. -Narcotics are only used when pain is severe or there is breakthrough pain. -You are not expected to have a pain score of 0 after surgery, as we cannot prevent pain. A pain score of 3-4 that allows you to be functional, move, walk, and tolerate some activity is the goal. The pain will continue to improve over the days after surgery and is dependent on your surgery. -Due to [place] law, we are only able to give a certain amount of pain medication to treat post operative pain, and we only give additional narcotics on a patient by patient basis.  -For most laparoscopic surgery, studies have shown that the majority of patients only need 10-15 narcotic pills, and for open surgeries most patients only need 15-20.   -Having appropriate expectations of pain and knowledge of pain  management with non narcotics is important as we do not want anyone to become addicted to narcotic pain medication.  -Using ice packs in the first 48 hours and heating pads after 48 hours, wearing an abdominal binder (when recommended), and using over the counter medications are all ways to help with pain management.   -Simple acts like meditation and mindfulness practices after surgery can also help with pain control and research has proven the benefit of these practices.  Medication: Take tylenol and ibuprofen as needed for pain control, alternating every 4-6 hours.  Example:  Tylenol 1000mg  @ 6am, 12noon, 6pm, 9midnight (Do not exceed 4000mg  of tylenol a day). Ibuprofen 800mg  @ 9am, 3pm, 9pm, 3am (Do not exceed 3600mg  of ibuprofen a day).  Take Tramadol for breakthrough pain every 4 hours.  Take Colace for constipation related to narcotic pain medication. If you do not have a bowel movement in 2 days, take Miralax over the counter.  Drink plenty of water to also prevent constipation.   Contact Information: If you have questions or concerns, please call our office, [phone], Monday- Thursday 8AM-5PM and Friday 8AM-12Noon.  If it is after hours or on the weekend, please call [facility], [phone], and ask to speak to the surgeon on call for Dr. Constance Haw at [facility].   Thyroidectomy, Care After This sheet gives you information about how to care for yourself after your procedure. Your health care provider may also give you more  specific instructions. If you have problems or questions, contact your health care provider. What can I expect after the procedure? After the procedure, it is common to have:  Mild pain in the neck or upper body, especially when swallowing.  A swollen neck.  A sore throat.  A weak or hoarse voice.  Slight tingling or numbness around your mouth, or in your fingers or toes. This may last for a day or two after surgery. This condition is caused by  low levels of calcium. You may be given calcium supplements to treat it. Follow these instructions at home: Medicines  Take over-the-counter and prescription medicines only as told by your health care provider.  Do not drive or use heavy machinery while taking prescription pain medicine.  Do not take medicines that contain aspirin and ibuprofen until your health care provider says that you can. These medicines can increase your risk of bleeding.  Take a thyroid hormone medicine as recommended by your health care provider. You will have to take this medicine for the rest of your life if your entire thyroid was removed. Eating and drinking  Start slowly with eating. You may need to have only liquids and soft foods for a few days or as directed by your health care provider.  To prevent or treat constipation while you are taking prescription pain medicine, your health care provider may recommend that you: ? Drink enough fluid to keep your urine pale yellow. ? Take over-the-counter or prescription medicines. ? Eat foods that are high in fiber, such as fresh fruits and vegetables, whole grains, and beans. ? Limit foods that are high in fat and processed sugars, such as fried and sweet foods. Incision care  Follow instructions from your health care provider about how to take care of your incision. Make sure you: ? Wash your hands with soap and water before you change your bandage (dressing). If soap and water are not available, use hand sanitizer. ? Change your dressing as told by your health care provider. ? Leave stitches (sutures), skin glue, or adhesive strips in place. These skin closures may need to stay in place for 2 weeks or longer. If adhesive strip edges start to loosen and curl up, you may trim the loose edges. Do not remove adhesive strips completely unless your health care provider tells you to do that.  Check your incision area every day for signs of infection. Check  for: ? Redness, swelling, or pain. ? Fluid or blood. ? Warmth. ? Pus or a bad smell.  Do not take baths, swim, or use a hot tub until your health care provider approves. Activity  For the first 10 days after the procedure or as instructed by your health care provider: ? Do not lift anything that is heavier than 10 lb (4.5 kg). ? Do not jog, swim, or do other strenuous exercises. ? Do not play contact sports.  Avoid sitting for a long time without moving. Get up to take short walks every 1-2 hours. This is needed to improve blood flow and breathing. Ask for help if you feel weak or unsteady.  Return to your normal activities as told by your health care provider. Ask your health care provider what activities are safe for you. General instructions  Do not use any products that contain nicotine or tobacco, such as cigarettes and e-cigarettes. These can delay healing after surgery. If you need help quitting, ask your health care provider.  Keep all follow-up visits as told  by your health care provider. This is important. Your health care provider needs to monitor the calcium level in your blood to make sure that it does not become low.   Contact a health care provider if you:  Have a fever.  Have more redness, swelling, or pain around your incision area.  Have fluid or blood coming from your incision area.  Notice that your incision area feels warm to the touch.  Have pus or a bad smell coming from your incision area.  Have trouble talking.  Have nausea or vomiting for more than 2 days. Get help right away if you:  Have trouble breathing.  Have trouble swallowing.  Develop a rash.  Develop a cough that gets worse.  Notice that your speech changes, or you have hoarseness that gets worse.  Develop numbness, tingling, or muscle spasms in the arms, hands, feet, or face. Summary  After the procedure, it is common to feel mild pain in the neck or upper body, especially when  swallowing.  Take medicines as told by your health care provider. These include pain medicines and thyroid hormones, if required.  Follow instructions from your health care provider about how to take care of your incision. Watch for signs of infection.  Keep all follow-up visits as told by your health care provider. This is important. Your health care provider needs to monitor the calcium level in your blood to make sure that it does not become low.  Get help right away if you develop difficulty breathing, or numbness, tingling, or muscle spasms in the arms, hands, feet, or face. This information is not intended to replace advice given to you by your health care provider. Make sure you discuss any questions you have with your health care provider. Document Revised: 04/28/2020 Document Reviewed: 04/28/2020 Elsevier Patient Education  2021 [employer].  Monitor for signs of hypocalcemia related to the parathyroid being bruised/ beat up during surgery.   Hypocalcemia, Adult Hypocalcemia is when the level of calcium in a person's blood is below normal. Calcium is a mineral that is used by the body in many ways. Not having enough blood calcium can affect the nervous system. This can lead to problems with muscles, the heart, and the brain. What are the causes? This condition may be caused by:  A deficiency of vitamin D or magnesium or both.  Decreased levels of parathyroid hormone (hypoparathyroidism).  Kidney function problems.  Low levels of a body protein called albumin.  Inflammation of the pancreas (pancreatitis).  Not taking in enough vitamins and minerals in the diet or having intestinal problems that interfere with nutrient absorption.  Certain medicines. What are the signs or symptoms? Some people may not have any symptoms, especially if they have long-term (chronic) hypocalcemia. Symptoms of this condition may include:  Numbness and tingling in the fingers, toes, or around  the mouth.  Muscle twitching, aches, or cramps, especially in the legs, feet, and back.  Spasm of the voice box (laryngospasm). This may make it difficult to breath or speak.  Fast heartbeats (palpitations) and abnormal heart rhythms (arrhythmias).  Shaking uncontrollably (seizures).  Memory problems, confusion, or difficulty thinking.  Depression, anxiety, irritability, or changes in personality. Long-term symptoms of this condition may include:  Coarse, brittle hair and nails.  Dry skin or lasting skin diseases (psoriasis, eczema, or dermatitis).  Dental cavities.  Clouding of the eye lens (cataracts). How is this diagnosed? This condition is usually diagnosed with a blood test. You may  also have other tests to help determine the underlying cause of the condition. This may include more blood tests and imaging tests.   How is this treated? This condition may be treated with:  Calcium given by mouth (orally) or given through an IV. The method used for giving calcium will depend on the severity of the condition. If your condition is severe, you may need to be closely monitored in the hospital.  Giving other minerals (electrolytes), such as magnesium. Other treatment will depend on the cause of the condition. Follow these instructions at home:  Follow diet instructions from your health care provider or dietitian.  Take supplements only as told by your health care provider.  Keep all follow-up visits as told by your health care provider. This is important. Contact a health care provider if you:  Have increased muscle twitching or cramps.  Have new swelling in the feet, ankles, or legs.  Develop changes in mood, memory, or personality. Get help right away if you:  Have chest pain.  Have persistent rapid or irregular heartbeats.  Have difficulty breathing.  Faint.  Start to have seizures.  Have confusion. Summary  Hypocalcemia is when the level of calcium in a  person's blood is below normal. Not having enough blood calcium can affect the nervous system. This can lead to problems with muscles, the heart, and the brain.  This condition may be treated with calcium given by mouth or through an IV, taking other minerals, and treating the underlying cause of hypocalcemia.  Take supplements only as told by your health care provider.  Contact a health care provider if you have new or worsening symptoms.  Keep all follow-up visits as told by your health care provider. This is important. This information is not intended to replace advice given to you by your health care provider. Make sure you discuss any questions you have with your health care provider. Document Revised: 08/29/2018 Document Reviewed: 08/29/2018 Elsevier Patient Education  2021 [employer].

## 2021-01-24 NOTE — Plan of Care (Signed)

## 2021-01-24 NOTE — Care Management Obs Status (Signed)
Organ NOTIFICATION   Patient Details  Name: Jerry Macdonald MRN: [id number] Date of Birth: Jan 14, 1931   Medicare Observation Status Notification Given:  Yes    [name] 01/24/2021, 1:59 PM

## 2021-01-24 NOTE — Discharge Summary (Addendum)
Physician Discharge Summary  Patient ID: Jerry Macdonald MRN: [id number] DOB/AGE: [birth year] 87 y.o.  Admit date: 01/23/2021 Discharge date: 01/24/2021  Admission Diagnoses: Multinodular goiter   Discharge Diagnoses:  Principal Problem:   Multinodular goiter Active Problems:   S/P thyroidectomy   Discharged Condition: good  Hospital Course:  Mr. Naves is a very sweet 87 yo with multinodular goiter who had a total thyroidectomy on 01/23/2021. His post op PTH was wnl and he had some minor drainage from his drain tube. His drain tube was removed at the bedside without issues. He was able to phonate ee and reports only minimal hoarseness. He denies any pain.   Consults: None  Significant Diagnostic Studies: Minor increase in leukocytosis is stress response post op, slight drift in H&H, no signs of bleeding  Results for KOUPER, SPINELLA (MRN [id number]) as of 01/24/2021 14:25  Ref. Range 01/24/2021 05:29  Sodium Latest Ref Range: 135 - 145 mmol/L 136  Potassium Latest Ref Range: 3.5 - 5.1 mmol/L 4.1  Chloride Latest Ref Range: 98 - 111 mmol/L 102  CO2 Latest Ref Range: 22 - 32 mmol/L 28  Glucose Latest Ref Range: 70 - 99 mg/dL 123 (H)  BUN Latest Ref Range: 8 - 23 mg/dL 17  Creatinine Latest Ref Range: 0.61 - 1.24 mg/dL 0.70  Calcium Latest Ref Range: 8.9 - 10.3 mg/dL 8.7 (L)  Anion gap Latest Ref Range: 5 - 15  6  Magnesium Latest Ref Range: 1.7 - 2.4 mg/dL 1.8  GFR, Estimated Latest Ref Range: >60 mL/min >60  WBC Latest Ref Range: 4.0 - 10.5 K/uL 14.0 (H)  RBC Latest Ref Range: 4.22 - 5.81 MIL/uL 3.80 (L)  Hemoglobin Latest Ref Range: 13.0 - 17.0 g/dL 10.7 (L)  HCT Latest Ref Range: 39.0 - 52.0 % 34.5 (L)  MCV Latest Ref Range: 80.0 - 100.0 fL 90.8  MCH Latest Ref Range: 26.0 - 34.0 pg 28.2  MCHC Latest Ref Range: 30.0 - 36.0 g/dL 31.0  RDW Latest Ref Range: 11.5 - 15.5 % 19.6 (H)  Platelets Latest Ref Range: 150 - 400 K/uL 185  nRBC Latest Ref Range: 0.0 - 0.2 % 0.0    Results for NAZEER, ROMNEY (MRN [id number]) as of 01/24/2021 14:25  Ref. Range 01/23/2021 15:52  PTH, Intact Latest Ref Range: 15 - 65 pg/mL 25  Calcium, Total (PTH) Latest Ref Range: 8.6 - 10.2 mg/dL 8.6  PTH Interp Unknown Comment   Treatments: Total thyroidectomy 01/23/2021   Discharge Exam: Blood pressure (!) 139/95, pulse 75, temperature 98 F (36.7 C), resp. rate 20, SpO2 98 %. General appearance: alert, cooperative and no distress Neck: incision c/d/i with dermabond, drain removed and dressing placed, bruising but soft and no hematoma Resp: normal work of breathing  Disposition: Discharge disposition: 01-Home or Self Care       Discharge Instructions    Call MD for:  difficulty breathing, headache or visual disturbances   Complete by: As directed    Call MD for:  extreme fatigue   Complete by: As directed    Call MD for:  persistant dizziness or light-headedness   Complete by: As directed    Call MD for:  persistant nausea and vomiting   Complete by: As directed    Call MD for:  redness, tenderness, or signs of infection (pain, swelling, redness, odor or green/yellow discharge around incision site)   Complete by: As directed    Call MD for:  severe uncontrolled pain   Complete by:  As directed    Call MD for:  temperature >100.4   Complete by: As directed    Increase activity slowly   Complete by: As directed      Allergies as of 01/24/2021      Reactions   Penicillins    Swelling of hands and feet - severe Did it involve swelling of the face/tongue/throat, SOB, or low BP? No Did it involve sudden or severe rash/hives, skin peeling, or any reaction on the inside of your mouth or nose? No Did you need to seek medical attention at a hospital or doctor's office? No When did it last happen?1950s If all above answers are "NO", may proceed with cephalosporin use.   Penicillin G Other (See Comments)      Medication List    STOP taking these medications    CALCIUM PO     TAKE these medications   acetaminophen 500 MG tablet Commonly known as: TYLENOL Take 500 mg by mouth every 8 (eight) hours as needed for moderate pain.   calcium-vitamin D 500-200 MG-UNIT tablet Commonly known as: OSCAL WITH D Take 1 tablet by mouth 3 (three) times daily.   ferrous sulfate 324 MG Tbec Take 324 mg by mouth every other day.   finasteride 5 MG tablet Commonly known as: PROSCAR Take 1 tablet (5 mg total) by mouth daily.   levothyroxine 75 MCG tablet Commonly known as: SYNTHROID Take 1 tablet (75 mcg total) by mouth daily at 6 (six) AM. Start taking on: Jan 25, 2021   magnesium oxide 400 MG tablet Commonly known as: MAG-OX Take 400 mg by mouth 2 (two) times daily.   multivitamins ther. w/minerals Tabs tablet Take 1 tablet by mouth daily.   ondansetron 4 MG disintegrating tablet Commonly known as: ZOFRAN-ODT Take 1 tablet (4 mg total) by mouth every 6 (six) hours as needed for nausea.   pantoprazole 40 MG tablet Commonly known as: PROTONIX TAKE 1 TABLET TWICE A DAY BEFORE MEALS What changed: See the new instructions.   tamsulosin 0.4 MG Caps capsule Commonly known as: FLOMAX Take 1 capsule (0.4 mg total) by mouth daily.   traMADol 50 MG tablet Commonly known as: ULTRAM Take 1 tablet (50 mg total) by mouth every 6 (six) hours as needed for moderate pain or severe pain.       Follow-up Information    Virl Cagey, MD Follow up on 02/02/2021.   Specialty: General Surgery Contact information: [address] Linna Hoff [facility] [address] [phone]        Cassandria Anger, MD Follow up in 1 week(s).   Specialty: Endocrinology Why: 7-10 days post op Contact information: [place] [address] [phone]              Future Appointments  Date Time Provider [place]  04/06/2021 10:30 AM AUR-LAB AUR-AUR None  04/13/2021  3:30 PM Irine Seal, MD AUR-AUR None  04/17/2021  1:20 PM AP-ACAPA  LAB AP-ACAPA None  04/24/2021  1:00 PM Cassandria Anger, MD REA-REA None  04/24/2021  3:45 PM Derek Jack, MD AP-ACAPA None     Signed: Virl Cagey 01/24/2021, 2:26 PM

## 2021-01-25 LAB — SURGICAL PATHOLOGY

## 2021-01-26 ENCOUNTER — Telehealth (INDEPENDENT_AMBULATORY_CARE_PROVIDER_SITE_OTHER): Payer: Medicare Other | Admitting: General Surgery

## 2021-01-26 DIAGNOSIS — E042 Nontoxic multinodular goiter: Secondary | ICD-10-CM

## 2021-01-26 NOTE — Telephone Encounter (Signed)
Noted  

## 2021-01-26 NOTE — Telephone Encounter (Signed)
[place], patient's friend and notified that pathology all good and no cancer.  Patient doing well and bruising is about the same to down. He is taking the thyroid medication and the calcium.  He denies any perioral numbness or metal taste.   FINAL MICROSCOPIC DIAGNOSIS:   A. THYROID GLAND, LEFT LOBE, THYROIDECTOMY:  - Nodular hyperplasia.   B. THYROID GLAND, RIGHT LOBE AND ISTHMUS, HEMITHYROIDECTOMY:  - Nodular hyperplasia.   Seeing Dr. Dorris Fetch and myself on 6/2.   Curlene Labrum, MD [facility] [address] [place], [place] [address] [phone] (office)

## 2021-02-02 ENCOUNTER — Encounter: Payer: Self-pay | Admitting: "Endocrinology

## 2021-02-02 ENCOUNTER — Ambulatory Visit (INDEPENDENT_AMBULATORY_CARE_PROVIDER_SITE_OTHER): Payer: Medicare Other | Admitting: General Surgery

## 2021-02-02 ENCOUNTER — Encounter: Payer: Self-pay | Admitting: General Surgery

## 2021-02-02 ENCOUNTER — Ambulatory Visit (INDEPENDENT_AMBULATORY_CARE_PROVIDER_SITE_OTHER): Payer: Medicare Other | Admitting: "Endocrinology

## 2021-02-02 ENCOUNTER — Other Ambulatory Visit: Payer: Self-pay

## 2021-02-02 VITALS — BP 110/68 | HR 72 | Ht 70.5 in | Wt 193.0 lb

## 2021-02-02 VITALS — BP 116/74 | HR 71 | Temp 98.1°F | Resp 18 | Ht 70.5 in | Wt 194.0 lb

## 2021-02-02 DIAGNOSIS — E042 Nontoxic multinodular goiter: Secondary | ICD-10-CM | POA: Diagnosis not present

## 2021-02-02 DIAGNOSIS — E890 Postprocedural hypothyroidism: Secondary | ICD-10-CM

## 2021-02-02 NOTE — Progress Notes (Signed)
02/02/2021, 5:29 PM         Endocrinology follow-up note   Subjective:    Patient ID: Jerry Macdonald, male    DOB: 1931/06/08, PCP [name], MD   Past Medical History:  Diagnosis Date  . Abnormal prostate biopsy   . Back pain    with radiculopathy  . Cataracts, bilateral   . Constipation   . Family history of lung cancer   . Family history of ovarian cancer   . Family history of prostate cancer   . Goiter   . HOH (hard of hearing)   . Hyperlipidemia   . Hypertrophy of prostate   . Malaise and fatigue   . Obesity   . Pyogenic granuloma    left cheeck  . S/P colonoscopy June 2011   rectal bleeding secondary to hemorrhoids  . S/P colonoscopy 2007   [facility], diverticulosis per Dr. Juel Burrow note  . S/P endoscopy June 2011   Barrett's, mild gastritis and duodenitis  . Seborrheic keratosis   . Sleep apnea    Past Surgical History:  Procedure Laterality Date  . BIOPSY  03/27/2019   Procedure: BIOPSY;  Surgeon: Danie Binder, MD;  [facility];  Service: Endoscopy;;  . BIOPSY  08/16/2020   Procedure: BIOPSY;  Surgeon: Eloise Harman, DO;  [facility];  Service: Endoscopy;;  . COLONOSCOPY  JUN 2011   PAN-COLONIC DIVERTICULOSIS  . COLONOSCOPY  2007   [place]  . COLONOSCOPY WITH PROPOFOL N/A 08/16/2020   Procedure: COLONOSCOPY WITH PROPOFOL;  Surgeon: Eloise Harman, DO;  [facility];  Service: Endoscopy;  Laterality: N/A;  10:45am  . ESOPHAGOGASTRODUODENOSCOPY  06/11/2011   Procedure: ESOPHAGOGASTRODUODENOSCOPY (EGD);  Surgeon: Dorothyann Peng, MD;  [facility];  Service: Endoscopy;  Laterality: N/A;  11:00  . ESOPHAGOGASTRODUODENOSCOPY N/A 05/21/2014   Dr. Fields:Barrett's esophagus/ Medium sized hiatal hernia. path with indefinite dysplasia.   Marland Kitchen ESOPHAGOGASTRODUODENOSCOPY N/A 12/20/2014   Dr. Clayburn Pert gastric polyp/large HH/6 cm segment of Barrett's  esophagus, path with hyperplastic gastric polyp, no dysplasia. Surveillance April 2017 due   . ESOPHAGOGASTRODUODENOSCOPY N/A 01/20/2016   Procedure: ESOPHAGOGASTRODUODENOSCOPY (EGD);  Surgeon: Danie Binder, MD;  [facility];  Service: Endoscopy;  Laterality: N/A;  1230pm  . ESOPHAGOGASTRODUODENOSCOPY N/A 03/27/2019   Procedure: ESOPHAGOGASTRODUODENOSCOPY (EGD);  Surgeon: Danie Binder, MD;  [facility];  Service: Endoscopy;  Laterality: N/A;  12:15pm  . HERNIA REPAIR    . LAPAROSCOPIC PARTIAL COLECTOMY Right 09/28/2020   Procedure: LAPAROSCOPIC RIGHT HEMI COLECTOMY;  Surgeon: Virl Cagey, MD;  Location: [facility];  Service: General;  Laterality: Right;  . THYROIDECTOMY N/A 01/23/2021   Procedure: TOTAL THYROIDECTOMY;  Surgeon: Virl Cagey, MD;  Location: [facility];  Service: General;  Laterality: N/A;  . UMBILICAL HERNIA REPAIR    . VARICOSE VEIN SURGERY     Social History   Socioeconomic History  . Marital status: Divorced    Spouse name: Not on file  . Number of children: Not on file  . Years of education: Not on file  . Highest education level: Not on file  Occupational History  . Not on  file  Tobacco Use  . Smoking status: Former Smoker    Packs/day: 0.50    Years: 2.00    Pack years: 1.00    Types: Cigarettes    Quit date: 09/10/1963    Years since quitting: 57.4  . Smokeless tobacco: Never Used  . Tobacco comment: quit in 1965  Vaping Use  . Vaping Use: Former  Substance and Sexual Activity  . Alcohol use: Not Currently  . Drug use: No  . Sexual activity: Not Currently  Other Topics Concern  . Not on file  Social History Narrative  . Not on file   Social Determinants of Health   Financial Resource Strain: Not on file  Food Insecurity: Not on file  Transportation Needs: No Transportation Needs  . Lack of Transportation (Medical): No  . Lack of Transportation (Non-Medical): No  Physical Activity: Inactive  . Days of Exercise per  Week: 0 days  . Minutes of Exercise per Session: 0 min  Stress: Not on file  Social Connections: Not on file   Family History  Problem Relation Age of Onset  . Prostate cancer Father 40       metastatic  . Lung cancer Brother 63       heavy smoker  . Prostate cancer Paternal Uncle        metastatic  . Alzheimer's disease Maternal Grandmother   . Prostate cancer Maternal Grandfather 69       metastatic  . Prostate cancer Paternal Grandfather 14       metastatic  . Ovarian cancer Paternal Aunt 31  . Prostate cancer Paternal Uncle        metastatic  . Cancer Cousin 52       unknown type, paternal first cousin  . Liver cancer Cousin        dx 19s, paternal first cousin  . Colon polyps Neg Hx   . Colon cancer Neg Hx    Outpatient Encounter Medications as of 02/02/2021  Medication Sig  . Cholecalciferol (VITAMIN D3) 25 MCG (1000 UT) CAPS Take 1 capsule by mouth 3 (three) times daily.  Marland Kitchen acetaminophen (TYLENOL) 500 MG tablet Take 500 mg by mouth every 8 (eight) hours as needed for moderate pain.  . calcium-vitamin D (OSCAL WITH D) 500-200 MG-UNIT tablet Take 1 tablet by mouth 3 (three) times daily.  . ferrous sulfate 324 MG TBEC Take 324 mg by mouth every other day.  . finasteride (PROSCAR) 5 MG tablet Take 1 tablet (5 mg total) by mouth daily.  Marland Kitchen levothyroxine (SYNTHROID) 75 MCG tablet Take 1 tablet (75 mcg total) by mouth daily at 6 (six) AM.  . magnesium oxide (MAG-OX) 400 MG tablet Take 400 mg by mouth 2 (two) times daily.  . Multiple Vitamins-Minerals (MULTIVITAMINS THER. W/MINERALS) TABS Take 1 tablet by mouth daily.  . ondansetron (ZOFRAN-ODT) 4 MG disintegrating tablet Take 1 tablet (4 mg total) by mouth every 6 (six) hours as needed for nausea.  . pantoprazole (PROTONIX) 40 MG tablet TAKE 1 TABLET TWICE A DAY BEFORE MEALS (Patient taking differently: Take 40 mg by mouth 2 (two) times daily before a meal.)  . tamsulosin (FLOMAX) 0.4 MG CAPS capsule Take 1 capsule (0.4 mg total)  by mouth daily.  . traMADol (ULTRAM) 50 MG tablet Take 1 tablet (50 mg total) by mouth every 6 (six) hours as needed for moderate pain or severe pain.   No facility-administered encounter medications on file as of 02/02/2021.   ALLERGIES: Allergies  Allergen Reactions  . Penicillins     Swelling of hands and feet - severe Did it involve swelling of the face/tongue/throat, SOB, or low BP? No Did it involve sudden or severe rash/hives, skin peeling, or any reaction on the inside of your mouth or nose? No Did you need to seek medical attention at a hospital or doctor's office? No When did it last happen?1950s If all above answers are "NO", may proceed with cephalosporin use.   Marland Kitchen Penicillin G Other (See Comments)    VACCINATION STATUS: Immunization History  Administered Date(s) Administered  . DTaP 06/03/2013  . H1N1 08/12/2008  . Influenza Whole 07/07/2007, 05/20/2008  . Influenza-Unspecified 06/03/2013, 06/03/2018  . Pneumococcal-Unspecified 06/03/2013, 06/03/2014  . Varicella 06/03/2013    HPI KEARNEY EVITT is 87 y.o. male who is seen in follow-up for history of compressive multinodular goiter.  He underwent total thyroidectomy on Jan 23, 2021 by Dr. Blake Divine.   His surgical sample is negative for malignancy.  He reports feeling better, previous compressive symptoms have resolved.  He was initiated on levothyroxine 75 mcg p.o. daily before breakfast.   -He did not have recent labs for thyroid function.   He is accompanied by  Kathee Polite his friend and power of attorney during this telephone visit.  PMD:  [name], MD.   He is not an optimal historian.  History is obtained mainly from chart review. he has been dealing with symptoms of fatigue, positional shortness of breath, sleep apnea for several years. He reports that he breathes better, swallows without difficulty.  He has gained 5 pounds since last visit.  He is a former smoker, denies any exposure to neck  radiations. His other medical problems include BPH/prostate cancer, Barrett's esophagus, sleep apnea, and recently diagnosed cecal/colon cancer. He is cared for by an elderly friend Dola Argyle is also his power of attorney.   Review of Systems  Limited as above.  Objective:    Vitals with BMI 02/02/2021 02/02/2021 01/24/2021  Height 5' 10.5" 5' 10.5" -  Weight 194 lbs 193 lbs -  BMI 54.49 20.10 -  Systolic 071 219 758  Diastolic 74 68 95  Pulse 71 72 75    BP 110/68   Pulse 72   Ht 5' 10.5" (1.791 m)   Wt 193 lb (87.5 kg)   BMI 27.30 kg/m   Wt Readings from Last 3 Encounters:  02/02/21 194 lb (88 kg)  02/02/21 193 lb (87.5 kg)  01/19/21 196 lb 9.6 oz (89.2 kg)    Physical Exam    CMP ( most recent) CMP     Component Value Date/Time   NA 136 01/24/2021 0529   K 4.1 01/24/2021 0529   CL 102 01/24/2021 0529   CO2 28 01/24/2021 0529   GLUCOSE 123 (H) 01/24/2021 0529   BUN 17 01/24/2021 0529   CREATININE 0.70 01/24/2021 0529   CALCIUM 8.7 (L) 01/24/2021 0529   CALCIUM 8.6 01/23/2021 1552   PROT 6.8 01/19/2021 1415   ALBUMIN 3.9 01/19/2021 1415   AST 22 01/19/2021 1415   ALT 16 01/19/2021 1415   ALKPHOS 56 01/19/2021 1415   BILITOT 1.5 (H) 01/19/2021 1415   GFRNONAA >60 01/24/2021 0529   GFRAA  02/03/2010 1249    >60        The eGFR has been calculated using the MDRD equation. This calculation has not been validated in all clinical situations. eGFR's persistently <60 mL/min signify possible Chronic Kidney Disease.  Lipid Panel ( most recent) Lipid Panel     Component Value Date/Time   CHOL 139 08/12/2008 2244   TRIG 88 08/12/2008 2244   HDL 39 (L) 08/12/2008 2244   CHOLHDL 3.6 Ratio 08/12/2008 2244   VLDL 18 08/12/2008 2244   LDLCALC 82 08/12/2008 2244      Lab Results  Component Value Date   TSH 0.103 (L) 01/19/2021   TSH 0.198 (L) 10/21/2020   TSH 0.075 (L) 09/06/2020   TSH 0.772 08/09/2007   FREET4 1.06 10/21/2020    September 14, 2020 thyroid ultrasound: Right lobe measures 13.8 x 6.7 x 6.4 cm with large 5.3 cm nodule which was biopsied and findings were benign. Left lobe measured 9.9 x 4.9 x 3.5 cm with 2 nodules measuring 4.8 cm Pseudonodule not biopsied and 1.5 cm-suspicious biopsied.  Fine-needle aspiration biopsy results of only thyroid nodule is available today showing benign follicular nodule.  Total thyroidectomy Jan 23, 2021. Surgical sample was significant for nodular hyperplasia bilaterally.  Assessment & Plan:   1.  Postsurgical hypothyroidism 2. Multinodular goiter 3.  Hypocalcemia  His compressive symptoms have resolved.  I discussed his surgical samples/findings with him.  He is appropriately initiated on levothyroxine for postsurgical hypothyroidism.  He will need a new set of thyroid function test to assist with dose adjustment. He will be sent to lab today.  He will be contacted soon as labs are ready.  In the meantime he is advised to continue levothyroxine 75 mcg p.o. daily before breakfast.   - We discussed about the correct intake of his thyroid hormone, on empty stomach at fasting, with water, separated by at least 30 minutes from breakfast and other medications,  and separated by more than 4 hours from calcium, iron, multivitamins, acid reflux medications (PPIs). -Patient is made aware of the fact that thyroid hormone replacement is needed for life, dose to be adjusted by periodic monitoring of thyroid function tests.  For his postsurgical mild hypocalcemia, he is advised to continue calcium/vitamin D 500- 200 1 tablet 3 times daily AC.  - he is advised to maintain close follow up with [name], MD for primary care needs.   I spent 30 minutes in the care of the patient today including review of labs from Thyroid Function, CMP, and other relevant labs ; imaging/biopsy records (current and previous including abstractions from other facilities); face-to-face time discussing  his lab results  and symptoms, medications doses, his options of short and long term treatment based on the latest standards of care / guidelines;   and documenting the encounter.  Jerry Macdonald  participated in the discussions, expressed understanding, and voiced agreement with the above plans.  All questions were answered to his satisfaction. he is encouraged to contact clinic should he have any questions or concerns prior to his return visit.   Follow up plan: Return in about 6 months (around 08/04/2021), or also before next visit in 6 months, I will call him next week, for Labs Today- Non-Fasting Ok.   Glade Lloyd, MD [facility] [address] [place], [place] [address] Phone: [phone]  Fax: [phone]     02/02/2021, 5:29 PM  This note was partially dictated with voice recognition software. Similar sounding words can be transcribed inadequately or may not  be corrected upon review.

## 2021-02-02 NOTE — Patient Instructions (Signed)
Will call with calcium and left you know if you need to continue to take.  Follow up with Dr. Dorris Fetch.

## 2021-02-03 LAB — BASIC METABOLIC PANEL
BUN/Creatinine Ratio: 20 (ref 10–24)
BUN: 17 mg/dL (ref 8–27)
CO2: 26 mmol/L (ref 20–29)
Calcium: 8.7 mg/dL (ref 8.6–10.2)
Chloride: 103 mmol/L (ref 96–106)
Creatinine, Ser: 0.86 mg/dL (ref 0.76–1.27)
Glucose: 91 mg/dL (ref 65–99)
Potassium: 4.8 mmol/L (ref 3.5–5.2)
Sodium: 141 mmol/L (ref 134–144)
eGFR: 84 mL/min/{1.73_m2} (ref >59)

## 2021-02-03 LAB — TSH: TSH: 0.727 u[IU]/mL (ref 0.450–4.500)

## 2021-02-03 LAB — T4, FREE: Free T4: 1.02 ng/dL (ref 0.82–1.77)

## 2021-02-05 NOTE — Progress Notes (Signed)
[facility] Note   HPI:  87 y.o. Male presents to clinic for post-op follow-up evaluation after total thyroidectomy. He denies any hypocalcemic symptoms. He saw Dr. Dorris Fetch today.   Review of Systems:  Improved bruising and swelling  All other review of systems: otherwise negative   Vital Signs:  BP 116/74   Pulse 71   Temp 98.1 F (36.7 C) (Other (Comment))   Resp 18   Ht 5' 10.5" (1.791 m)   Wt 194 lb (88 kg)   SpO2 95%   BMI 27.44 kg/m    Physical Exam:  Physical Exam Vitals reviewed.  Neck:     Comments: Incision with improving bruising, minor swelling, dermabond intact, no erythema or drainage Cardiovascular:     Rate and Rhythm: Normal rate.  Pulmonary:     Effort: Pulmonary effort is normal.     Assessment:  87 y.o. yo Male s/p total thyroidectomy for goiter. He is doing well.  Plan:  Will call with calcium and left you know if you need to continue to take.  Follow up with Dr. Dorris Fetch.  PRN Follow up    Curlene Labrum, MD [facility] [address] [place], [place] [address] [phone] (office)

## 2021-02-07 ENCOUNTER — Telehealth: Payer: Self-pay | Admitting: "Endocrinology

## 2021-02-07 DIAGNOSIS — E890 Postprocedural hypothyroidism: Secondary | ICD-10-CM

## 2021-02-07 NOTE — Telephone Encounter (Signed)
Pt caregiver Izora Gala called and said that she was told someone would be calling with patient lab results to go over them and she has not heard from anyone. [phone]

## 2021-02-07 NOTE — Telephone Encounter (Signed)
Did not pick up. Just to inform him that his current dose of Levothyroxine is good and no need to change. Keep his next appointment with labs in 6 months.

## 2021-02-08 MED ORDER — LEVOTHYROXINE SODIUM 75 MCG PO TABS: 75.0000 ug | ORAL_TABLET | Freq: Every day | ORAL | 1 refills | Status: DC

## 2021-02-08 NOTE — Telephone Encounter (Signed)
Discussed with pt's caregiver Izora Gala, understanding voiced. Rx for levothyroxine 38mcg sent in electronically to [facility] as requested.

## 2021-03-01 DIAGNOSIS — R7301 Impaired fasting glucose: Secondary | ICD-10-CM | POA: Diagnosis not present

## 2021-03-03 DIAGNOSIS — R7301 Impaired fasting glucose: Secondary | ICD-10-CM | POA: Diagnosis not present

## 2021-03-03 DIAGNOSIS — D649 Anemia, unspecified: Secondary | ICD-10-CM | POA: Diagnosis not present

## 2021-03-03 DIAGNOSIS — C61 Malignant neoplasm of prostate: Secondary | ICD-10-CM | POA: Diagnosis not present

## 2021-03-03 DIAGNOSIS — E039 Hypothyroidism, unspecified: Secondary | ICD-10-CM | POA: Diagnosis not present

## 2021-03-27 ENCOUNTER — Other Ambulatory Visit (HOSPITAL_COMMUNITY): Payer: Self-pay | Admitting: Surgery

## 2021-03-27 DIAGNOSIS — C180 Malignant neoplasm of cecum: Secondary | ICD-10-CM

## 2021-03-27 MED ORDER — ONDANSETRON 4 MG PO TBDP
4.0000 mg | ORAL_TABLET | Freq: Four times a day (QID) | ORAL | 0 refills | Status: AC | PRN
Start: 2021-03-27 — End: ?

## 2021-04-06 ENCOUNTER — Other Ambulatory Visit: Payer: Medicare Other

## 2021-04-06 ENCOUNTER — Other Ambulatory Visit: Payer: Self-pay

## 2021-04-06 DIAGNOSIS — R972 Elevated prostate specific antigen [PSA]: Secondary | ICD-10-CM | POA: Diagnosis not present

## 2021-04-06 DIAGNOSIS — C61 Malignant neoplasm of prostate: Secondary | ICD-10-CM

## 2021-04-07 LAB — PSA: Prostate Specific Ag, Serum: 4.9 ng/mL — ABNORMAL HIGH (ref 0.0–4.0)

## 2021-04-12 ENCOUNTER — Other Ambulatory Visit (HOSPITAL_COMMUNITY): Payer: Medicare Other

## 2021-04-13 ENCOUNTER — Encounter: Payer: Self-pay | Admitting: Urology

## 2021-04-13 ENCOUNTER — Ambulatory Visit (INDEPENDENT_AMBULATORY_CARE_PROVIDER_SITE_OTHER): Payer: Medicare Other | Admitting: Urology

## 2021-04-13 ENCOUNTER — Other Ambulatory Visit: Payer: Self-pay

## 2021-04-13 ENCOUNTER — Ambulatory Visit: Payer: Medicare Other | Admitting: Urology

## 2021-04-13 VITALS — BP 118/62 | HR 76

## 2021-04-13 DIAGNOSIS — R972 Elevated prostate specific antigen [PSA]: Secondary | ICD-10-CM

## 2021-04-13 DIAGNOSIS — C61 Malignant neoplasm of prostate: Secondary | ICD-10-CM

## 2021-04-13 DIAGNOSIS — N138 Other obstructive and reflux uropathy: Secondary | ICD-10-CM

## 2021-04-13 DIAGNOSIS — N401 Enlarged prostate with lower urinary tract symptoms: Secondary | ICD-10-CM | POA: Diagnosis not present

## 2021-04-13 DIAGNOSIS — R351 Nocturia: Secondary | ICD-10-CM

## 2021-04-13 LAB — URINALYSIS, ROUTINE W REFLEX MICROSCOPIC
Bilirubin, UA: NEGATIVE
Glucose, UA: NEGATIVE
Ketones, UA: NEGATIVE
Leukocytes,UA: NEGATIVE
Nitrite, UA: NEGATIVE
Protein,UA: NEGATIVE
RBC, UA: NEGATIVE
Specific Gravity, UA: 1.025 (ref 1.005–1.030)
Urobilinogen, Ur: 0.2 mg/dL (ref 0.2–1.0)
pH, UA: 7 (ref 5.0–7.5)

## 2021-04-13 NOTE — Progress Notes (Signed)
Subjective:  1. Prostate cancer ([place])   2. Elevated PSA   3. BPH with urinary obstruction   4. Nocturia      Jerry Macdonald is a 87 year-old male established patient who is here evaluation for treatment of prostate cancer.  His prostate cancer was diagnosed 10/04/2014. His PSA at his time of diagnosis was 6. His  PSA has been variable with and 4.4 in 5/22 and 4.9 on 04/06/21.  It was 3.5 in 7/21, 4.08 on 09/06/20 and 5.8 on 10/06/20 but he had a colon resection and a foley prior to that blood draw.  He had colon cancer with negative margins.  He had a thyroidectomy for goiter on 01/23/21.   He remains on finasteride and tamsulosin.  His IPSS is 12 with nocturia x 2.  His UA is clear.    03/11/20: Jerry Macdonald returns today in f/u. He is AS from low volume Gleason 7(3+4) disease found on surveillance biopsy in 8/17. He had a single core Gleason 6 on his initial biopsy in 2016. He has been seen by Dr. Tammi Klippel and he felt continued AS was reasonable. He remains on finasteride and his PSA is back down to 3.5 from 3.8 at his last visit. He remains on tamsulosin. He is voiding well. He has nocturia x 2. He has a good stream without hesitancy. He has less urgency except in the morning. He does have a sensation of incomplete emptying and he can have a weak stream. His IPSS is 9. He has no associated signs or symptoms.   He was originally diagnosed in 2/16 with a single core of Gleason 6, 10% on the left mid lateral core. His PSA has risen very slowly and was 6.52 prior to a repeat biopsy in 8/17. The repeat biopsy showed a 131m prostate and there were 3 cores positive with 5% Gleason 6 in the left lateral base, 10% Gleason 7(3+4) in the left lateral apex and 20% Gleason 6 in the right medial apex.        ROS:  ROS:  A complete review of systems was performed.  All systems are negative except for pertinent findings as noted.   ROS  Allergies  Allergen Reactions   Penicillins     Swelling of hands and feet  - severe Did it involve swelling of the face/tongue/throat, SOB, or low BP? No Did it involve sudden or severe rash/hives, skin peeling, or any reaction on the inside of your mouth or nose? No Did you need to seek medical attention at a hospital or doctor's office? No When did it last happen?      1950s If all above answers are "NO", may proceed with cephalosporin use.    Penicillin G Other (See Comments)    Outpatient Encounter Medications as of 04/13/2021  Medication Sig   acetaminophen (TYLENOL) 500 MG tablet Take 500 mg by mouth every 8 (eight) hours as needed for moderate pain.   calcium-vitamin D (OSCAL WITH D) 500-200 MG-UNIT tablet Take 1 tablet by mouth 3 (three) times daily.   Cholecalciferol (VITAMIN D3) 25 MCG (1000 UT) CAPS Take 1 capsule by mouth 3 (three) times daily.   ferrous sulfate 324 MG TBEC Take 324 mg by mouth every other day.   finasteride (PROSCAR) 5 MG tablet Take 1 tablet (5 mg total) by mouth daily.   levothyroxine (SYNTHROID) 75 MCG tablet Take 1 tablet (75 mcg total) by mouth daily at 6 (six) AM.   magnesium oxide (MAG-OX) 400  MG tablet Take 400 mg by mouth 2 (two) times daily.   Multiple Vitamins-Minerals (MULTIVITAMINS THER. W/MINERALS) TABS Take 1 tablet by mouth daily.   ondansetron (ZOFRAN-ODT) 4 MG disintegrating tablet Take 1 tablet (4 mg total) by mouth every 6 (six) hours as needed for nausea.   pantoprazole (PROTONIX) 40 MG tablet TAKE 1 TABLET TWICE A DAY BEFORE MEALS (Patient taking differently: Take 40 mg by mouth 2 (two) times daily before a meal.)   tamsulosin (FLOMAX) 0.4 MG CAPS capsule Take 1 capsule (0.4 mg total) by mouth daily.   traMADol (ULTRAM) 50 MG tablet Take 1 tablet (50 mg total) by mouth every 6 (six) hours as needed for moderate pain or severe pain.   No facility-administered encounter medications on file as of 04/13/2021.    Past Medical History:  Diagnosis Date   Abnormal prostate biopsy    Back pain    with radiculopathy    Cataracts, bilateral    Constipation    Family history of lung cancer    Family history of ovarian cancer    Family history of prostate cancer    Goiter    HOH (hard of hearing)    Hyperlipidemia    Hypertrophy of prostate    Malaise and fatigue    Obesity    Pyogenic granuloma    left cheeck   S/P colonoscopy June 2011   rectal bleeding secondary to hemorrhoids   S/P colonoscopy 2007   [facility], diverticulosis per Dr. Juel Burrow note   S/P endoscopy June 2011   Barrett's, mild gastritis and duodenitis   Seborrheic keratosis    Sleep apnea     Past Surgical History:  Procedure Laterality Date   BIOPSY  03/27/2019   Procedure: BIOPSY;  Surgeon: Danie Binder, MD;  [facility];  Service: Endoscopy;;   BIOPSY  08/16/2020   Procedure: BIOPSY;  Surgeon: Eloise Harman, DO;  [facility];  Service: Endoscopy;;   COLONOSCOPY  JUN 2011   PAN-COLONIC DIVERTICULOSIS   COLONOSCOPY  2007   [facility]   COLONOSCOPY WITH PROPOFOL N/A 08/16/2020   Procedure: COLONOSCOPY WITH PROPOFOL;  Surgeon: Eloise Harman, DO;  [facility];  Service: Endoscopy;  Laterality: N/A;  10:45am   ESOPHAGOGASTRODUODENOSCOPY  06/11/2011   Procedure: ESOPHAGOGASTRODUODENOSCOPY (EGD);  Surgeon: Dorothyann Peng, MD;  [facility];  Service: Endoscopy;  Laterality: N/A;  11:00   ESOPHAGOGASTRODUODENOSCOPY N/A 05/21/2014   Dr. Fields:Barrett's esophagus/ Medium sized hiatal hernia. path with indefinite dysplasia.    ESOPHAGOGASTRODUODENOSCOPY N/A 12/20/2014   Dr. Clayburn Pert gastric polyp/large HH/6 cm segment of Barrett's esophagus, path with hyperplastic gastric polyp, no dysplasia. Surveillance April 2017 due    ESOPHAGOGASTRODUODENOSCOPY N/A 01/20/2016   Procedure: ESOPHAGOGASTRODUODENOSCOPY (EGD);  Surgeon: Danie Binder, MD;  [facility];  Service: Endoscopy;  Laterality: N/A;  1230pm   ESOPHAGOGASTRODUODENOSCOPY N/A 03/27/2019   Procedure:  ESOPHAGOGASTRODUODENOSCOPY (EGD);  Surgeon: Danie Binder, MD;  [facility];  Service: Endoscopy;  Laterality: N/A;  12:15pm   HERNIA REPAIR     LAPAROSCOPIC PARTIAL COLECTOMY Right 09/28/2020   Procedure: LAPAROSCOPIC RIGHT HEMI COLECTOMY;  Surgeon: Virl Cagey, MD;  Location: [facility];  Service: General;  Laterality: Right;   THYROIDECTOMY N/A 01/23/2021   Procedure: TOTAL THYROIDECTOMY;  Surgeon: Virl Cagey, MD;  Location: [facility];  Service: General;  Laterality: N/A;   [place]  Social History   Socioeconomic History   Marital status: Divorced    Spouse name: Not on file   Number of children: Not on file   Years of education: Not on file   Highest education level: Not on file  Occupational History   Not on file  Tobacco Use   Smoking status: Former    Packs/day: 0.50    Years: 2.00    Pack years: 1.00    Types: Cigarettes    Quit date: 09/10/1963    Years since quitting: 57.6   Smokeless tobacco: Never   Tobacco comments:    quit in 1965  Vaping Use   Vaping Use: Former  Substance and Sexual Activity   Alcohol use: Not Currently   Drug use: No   Sexual activity: Not Currently  Other Topics Concern   Not on file  Social History Narrative   Not on file   Social Determinants of Health   Financial Resource Strain: Not on file  Food Insecurity: Not on file  Transportation Needs: No Transportation Needs   Lack of Transportation (Medical): No   Lack of Transportation (Non-Medical): No  Physical Activity: Inactive   Days of Exercise per Week: 0 days   Minutes of Exercise per Session: 0 min  Stress: Not on file  Social Connections: Not on file  Intimate Partner Violence: Not At Risk   Fear of Current or Ex-Partner: No   Emotionally Abused: No   Physically Abused: No   Sexually Abused: No    Family History  Problem Relation Age of Onset   Prostate cancer Father 75       metastatic   Lung  cancer Brother 88       heavy smoker   Prostate cancer Paternal Uncle        metastatic   Alzheimer's disease Maternal Grandmother    Prostate cancer Maternal Grandfather 82       metastatic   Prostate cancer Paternal Grandfather 27       metastatic   Ovarian cancer Paternal Aunt 27   Prostate cancer Paternal Uncle        metastatic   Cancer Cousin 3       unknown type, paternal first cousin   Liver cancer Cousin        dx 28s, paternal first cousin   Colon polyps Neg Hx    Colon cancer Neg Hx        Objective: Vitals:   04/13/21 1522  BP: 118/62  Pulse: 76     Physical Exam  Lab Results:  Results for orders placed or performed in visit on 04/13/21 (from the past 24 hour(s))  Urinalysis, Routine w reflex microscopic     Status: None   Collection Time: 04/13/21  3:24 PM  Result Value Ref Range   Specific Gravity, UA 1.025 1.005 - 1.030   pH, UA 7.0 5.0 - 7.5   Color, UA Yellow Yellow   Appearance Ur Clear Clear   Leukocytes,UA Negative Negative   Protein,UA Negative Negative/Trace   Glucose, UA Negative Negative   Ketones, UA Negative Negative   RBC, UA Negative Negative   Bilirubin, UA Negative Negative   Urobilinogen, Ur 0.2 0.2 - 1.0 mg/dL   Nitrite, UA Negative Negative   Microscopic Examination Comment    Narrative   Performed at:  [address] - [place] [address], [place], [place]  [id number] Lab Director: Mina Marble MT, Phone:  [phone]    BMET No  results for input(s): NA, K, CL, CO2, GLUCOSE, BUN, CREATININE, CALCIUM in the last 72 hours. PSA Lab Results  Component Value Date   PSA1 4.9 (H) 04/06/2021   PSA1 4.4 (H) 01/10/2021   PSA1 5.8 (H) 10/06/2020     Lab Results  Component Value Date   PSA 3.5 03/04/2020   PSA Dr. [place] Pink of Urology follows 08/12/2008       Studies/Results: CT in 12/21 showed nothing to suggest prostate cancer mets.   Assessment & Plan: Prostate cancer on surveillance.   His PSA is rising  slowly on finasteride.  His exam remains benign.   At his advanced age, continue surveillance is most appropriate.  I will have him get a PSA in 3 mo and the f/u with a PSA in 6 months.    BPH with BOO.  He is voiding well on tamsulosin and finasteride.    No orders of the defined types were placed in this encounter.    Orders Placed This Encounter  Procedures   Urinalysis, Routine w reflex microscopic   PSA    Standing Status:   Future    Standing Expiration Date:   10/14/2021   PSA    Standing Status:   Future    Standing Expiration Date:   04/13/2022      Return in about 6 months (around 10/14/2021) for with PSA..   CC: [name], MD      Irine Seal 04/13/2021 Patient ID: [name], male   DOB: 1930-10-30, 87 y.o.   MRN: [id number]

## 2021-04-13 NOTE — Progress Notes (Signed)
Urological Symptom Review  Patient is experiencing the following symptoms: Frequent urination Get up at night to urinate   Review of Systems  Gastrointestinal (upper)  : Negative for upper GI symptoms  Gastrointestinal (lower) : Negative for lower GI symptoms  Constitutional : Weight loss Fatigue  Skin: Negative for skin symptoms  Eyes: Negative for eye symptoms  Ear/Nose/Throat : Negative for Ear/Nose/Throat symptoms  Hematologic/Lymphatic: Negative for Hematologic/Lymphatic symptoms  Cardiovascular : Negative for cardiovascular symptoms  Respiratory : Negative for respiratory symptoms  Endocrine: Negative for endocrine symptoms  Musculoskeletal: Negative for musculoskeletal symptoms  Neurological: Negative for neurological symptoms  Psychologic: Negative for psychiatric symptoms

## 2021-04-17 ENCOUNTER — Inpatient Hospital Stay (HOSPITAL_COMMUNITY): Payer: Medicare Other | Attending: Hematology

## 2021-04-17 ENCOUNTER — Telehealth: Payer: Self-pay

## 2021-04-17 ENCOUNTER — Other Ambulatory Visit: Payer: Self-pay

## 2021-04-17 DIAGNOSIS — Z8042 Family history of malignant neoplasm of prostate: Secondary | ICD-10-CM | POA: Insufficient documentation

## 2021-04-17 DIAGNOSIS — Z87891 Personal history of nicotine dependence: Secondary | ICD-10-CM | POA: Diagnosis not present

## 2021-04-17 DIAGNOSIS — Z801 Family history of malignant neoplasm of trachea, bronchus and lung: Secondary | ICD-10-CM | POA: Insufficient documentation

## 2021-04-17 DIAGNOSIS — Z8041 Family history of malignant neoplasm of ovary: Secondary | ICD-10-CM | POA: Insufficient documentation

## 2021-04-17 DIAGNOSIS — C180 Malignant neoplasm of cecum: Secondary | ICD-10-CM | POA: Insufficient documentation

## 2021-04-17 DIAGNOSIS — C61 Malignant neoplasm of prostate: Secondary | ICD-10-CM | POA: Diagnosis not present

## 2021-04-17 DIAGNOSIS — Z808 Family history of malignant neoplasm of other organs or systems: Secondary | ICD-10-CM | POA: Diagnosis not present

## 2021-04-17 LAB — CBC WITH DIFFERENTIAL/PLATELET
Abs Immature Granulocytes: 0.03 10*3/uL (ref 0.00–0.07)
Basophils Absolute: 0 10*3/uL (ref 0.0–0.1)
Basophils Relative: 0 %
Eosinophils Absolute: 0.2 10*3/uL (ref 0.0–0.5)
Eosinophils Relative: 2 %
HCT: 40 % (ref 39.0–52.0)
Hemoglobin: 13.4 g/dL (ref 13.0–17.0)
Immature Granulocytes: 0 %
Lymphocytes Relative: 22 %
Lymphs Abs: 1.5 10*3/uL (ref 0.7–4.0)
MCH: 32.3 pg (ref 26.0–34.0)
MCHC: 33.5 g/dL (ref 30.0–36.0)
MCV: 96.4 fL (ref 80.0–100.0)
Monocytes Absolute: 0.9 10*3/uL (ref 0.1–1.0)
Monocytes Relative: 12 %
Neutro Abs: 4.4 10*3/uL (ref 1.7–7.7)
Neutrophils Relative %: 64 %
Platelets: 184 10*3/uL (ref 150–400)
RBC: 4.15 MIL/uL — ABNORMAL LOW (ref 4.22–5.81)
RDW: 15.3 % (ref 11.5–15.5)
WBC: 7 10*3/uL (ref 4.0–10.5)
nRBC: 0 % (ref 0.0–0.2)

## 2021-04-17 LAB — IRON AND TIBC
Iron: 265 ug/dL — ABNORMAL HIGH (ref 45–182)
Saturation Ratios: 81 % — ABNORMAL HIGH (ref 17.9–39.5)
TIBC: 325 ug/dL (ref 250–450)
UIBC: 60 ug/dL

## 2021-04-17 LAB — COMPREHENSIVE METABOLIC PANEL
ALT: 21 U/L (ref 0–44)
AST: 25 U/L (ref 15–41)
Albumin: 3.8 g/dL (ref 3.5–5.0)
Alkaline Phosphatase: 56 U/L (ref 38–126)
Anion gap: 5 (ref 5–15)
BUN: 19 mg/dL (ref 8–23)
CO2: 28 mmol/L (ref 22–32)
Calcium: 8.8 mg/dL — ABNORMAL LOW (ref 8.9–10.3)
Chloride: 105 mmol/L (ref 98–111)
Creatinine, Ser: 0.8 mg/dL (ref 0.61–1.24)
GFR, Estimated: >60 mL/min (ref >60)
Glucose, Bld: 125 mg/dL — ABNORMAL HIGH (ref 70–99)
Potassium: 4.5 mmol/L (ref 3.5–5.1)
Sodium: 138 mmol/L (ref 135–145)
Total Bilirubin: 1.4 mg/dL — ABNORMAL HIGH (ref 0.3–1.2)
Total Protein: 6.7 g/dL (ref 6.5–8.1)

## 2021-04-17 LAB — FERRITIN: Ferritin: 12 ng/mL — ABNORMAL LOW (ref 24–336)

## 2021-04-18 LAB — CEA: CEA: 2.6 ng/mL (ref 0.0–4.7)

## 2021-04-19 ENCOUNTER — Ambulatory Visit (HOSPITAL_COMMUNITY): Payer: Medicare Other | Admitting: Hematology

## 2021-04-22 NOTE — Progress Notes (Signed)
Jerry Macdonald, Jerry Macdonald [id number]   CLINIC:  Medical Oncology/Hematology  PCP:  [name], MD [address] [place] [address] [phone]   REASON FOR VISIT:  Follow-up for cecal adenocarcinoma  PRIOR THERAPY: Laparoscopic right hemicolectomy on 09/28/2020  NGS Results: not done  CURRENT THERAPY: Oral iron tablet QOD  BRIEF ONCOLOGIC HISTORY:  Oncology History   No history exists.    CANCER STAGING: Cancer Staging No matching staging information was found for the patient.  INTERVAL HISTORY:  Jerry Macdonald, a 87 y.o. 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 Jerry Macdonald was last seen on 01/05/21.   Today Jerry Macdonald reports feeling well. Jerry Macdonald had a total thyroidectomy on 01/23/21 with Dr. Curlene Labrum. Jerry Macdonald denies any bleeding issues and reports normal BM.   REVIEW OF SYSTEMS:  Review of Systems  Constitutional:  Positive for fatigue (50%). Negative for appetite change.  HENT:   Negative for nosebleeds.   Gastrointestinal:  Negative for blood in stool.  Genitourinary:  Negative for hematuria.   Hematological:  Does not bruise/bleed easily.  All other systems reviewed and are negative.  PAST MEDICAL/SURGICAL HISTORY:  Past Medical History:  Diagnosis Date   Abnormal prostate biopsy    Back pain    with radiculopathy   Cataracts, bilateral    Constipation    Family history of lung cancer    Family history of ovarian cancer    Family history of prostate cancer    Goiter    HOH (hard of hearing)    Hyperlipidemia    Hypertrophy of prostate    Malaise and fatigue    Obesity    Pyogenic granuloma    left cheeck   S/P colonoscopy June 2011   rectal bleeding secondary to hemorrhoids   S/P colonoscopy 2007   [facility], diverticulosis per Dr. Juel Burrow note   S/P endoscopy June 2011   Barrett's, mild gastritis and duodenitis   Seborrheic keratosis    Sleep apnea    Past Surgical History:   Procedure Laterality Date   BIOPSY  03/27/2019   Procedure: BIOPSY;  Surgeon: Danie Binder, MD;  [facility];  Service: Endoscopy;;   BIOPSY  08/16/2020   Procedure: BIOPSY;  Surgeon: Eloise Harman, DO;  [facility];  Service: Endoscopy;;   COLONOSCOPY  JUN 2011   PAN-COLONIC DIVERTICULOSIS   COLONOSCOPY  2007   [facility]   COLONOSCOPY WITH PROPOFOL N/A 08/16/2020   Procedure: COLONOSCOPY WITH PROPOFOL;  Surgeon: Eloise Harman, DO;  [facility];  Service: Endoscopy;  Laterality: N/A;  10:45am   ESOPHAGOGASTRODUODENOSCOPY  06/11/2011   Procedure: ESOPHAGOGASTRODUODENOSCOPY (EGD);  Surgeon: Dorothyann Peng, MD;  [facility];  Service: Endoscopy;  Laterality: N/A;  11:00   ESOPHAGOGASTRODUODENOSCOPY N/A 05/21/2014   Dr. Fields:Barrett's esophagus/ Medium sized hiatal hernia. path with indefinite dysplasia.    ESOPHAGOGASTRODUODENOSCOPY N/A 12/20/2014   Dr. Clayburn Pert gastric polyp/large HH/6 cm segment of Barrett's esophagus, path with hyperplastic gastric polyp, no dysplasia. Surveillance April 2017 due    ESOPHAGOGASTRODUODENOSCOPY N/A 01/20/2016   Procedure: ESOPHAGOGASTRODUODENOSCOPY (EGD);  Surgeon: Danie Binder, MD;  [facility];  Service: Endoscopy;  Laterality: N/A;  1230pm   ESOPHAGOGASTRODUODENOSCOPY N/A 03/27/2019   Procedure: ESOPHAGOGASTRODUODENOSCOPY (EGD);  Surgeon: Danie Binder, MD;  [facility];  Service: Endoscopy;  Laterality: N/A;  12:15pm   HERNIA REPAIR  LAPAROSCOPIC PARTIAL COLECTOMY Right 09/28/2020   Procedure: LAPAROSCOPIC RIGHT HEMI COLECTOMY;  Surgeon: Virl Cagey, MD;  Location: [facility];  Service: General;  Laterality: Right;   THYROIDECTOMY N/A 01/23/2021   Procedure: TOTAL THYROIDECTOMY;  Surgeon: Virl Cagey, MD;  Location: [facility];  Service: General;  Laterality: N/A;   UMBILICAL HERNIA REPAIR     VARICOSE VEIN SURGERY      SOCIAL HISTORY:  Social History    Socioeconomic History   Marital status: Divorced    Spouse name: Not on file   Number of children: Not on file   Years of education: Not on file   Highest education level: Not on file  Occupational History   Not on file  Tobacco Use   Smoking status: Former    Packs/day: 0.50    Years: 2.00    Pack years: 1.00    Types: Cigarettes    Quit date: 09/10/1963    Years since quitting: 57.6   Smokeless tobacco: Never   Tobacco comments:    quit in 1965  Vaping Use   Vaping Use: Former  Substance and Sexual Activity   Alcohol use: Not Currently   Drug use: No   Sexual activity: Not Currently  Other Topics Concern   Not on file  Social History Narrative   Not on file   Social Determinants of Health   Financial Resource Strain: Not on file  Food Insecurity: Not on file  Transportation Needs: No Transportation Needs   Lack of Transportation (Medical): No   Lack of Transportation (Non-Medical): No  Physical Activity: Inactive   Days of Exercise per Week: 0 days   Minutes of Exercise per Session: 0 min  Stress: Not on file  Social Connections: Not on file  Intimate Partner Violence: Not At Risk   Fear of Current or Ex-Partner: No   Emotionally Abused: No   Physically Abused: No   Sexually Abused: No    FAMILY HISTORY:  Family History  Problem Relation Age of Onset   Prostate cancer Father 25       metastatic   Lung cancer Brother 1       heavy smoker   Prostate cancer Paternal Uncle        metastatic   Alzheimer's disease Maternal Grandmother    Prostate cancer Maternal Grandfather 82       metastatic   Prostate cancer Paternal Grandfather 53       metastatic   Ovarian cancer Paternal Aunt 2   Prostate cancer Paternal Uncle        metastatic   Cancer Cousin 16       unknown type, paternal first cousin   Liver cancer Cousin        dx 7s, paternal first cousin   Colon polyps Neg Hx    Colon cancer Neg Hx     CURRENT MEDICATIONS:  Current Outpatient  Medications  Medication Sig Dispense Refill   acetaminophen (TYLENOL) 500 MG tablet Take 500 mg by mouth every 8 (eight) hours as needed for moderate pain.     calcium-vitamin D (OSCAL WITH D) 500-200 MG-UNIT tablet Take 1 tablet by mouth 3 (three) times daily. 90 tablet 1   Cholecalciferol (VITAMIN D3) 25 MCG (1000 UT) CAPS Take 1 capsule by mouth 3 (three) times daily.     ferrous sulfate 324 MG TBEC Take 324 mg by mouth every other day.     finasteride (PROSCAR) 5 MG tablet Take 1 tablet (  5 mg total) by mouth daily. 90 tablet 3   levothyroxine (SYNTHROID) 75 MCG tablet Take 1 tablet (75 mcg total) by mouth daily at 6 (six) AM. 90 tablet 1   magnesium oxide (MAG-OX) 400 MG tablet Take 400 mg by mouth 2 (two) times daily.     Multiple Vitamins-Minerals (MULTIVITAMINS THER. W/MINERALS) TABS Take 1 tablet by mouth daily.     ondansetron (ZOFRAN-ODT) 4 MG disintegrating tablet Take 1 tablet (4 mg total) by mouth every 6 (six) hours as needed for nausea. 20 tablet 0   pantoprazole (PROTONIX) 40 MG tablet TAKE 1 TABLET TWICE A DAY BEFORE MEALS (Patient taking differently: Take 40 mg by mouth 2 (two) times daily before a meal.) 180 tablet 3   tamsulosin (FLOMAX) 0.4 MG CAPS capsule Take 1 capsule (0.4 mg total) by mouth daily. 90 capsule 3   traMADol (ULTRAM) 50 MG tablet Take 1 tablet (50 mg total) by mouth every 6 (six) hours as needed for moderate pain or severe pain. 30 tablet 0   No current facility-administered medications for this visit.    ALLERGIES:  Allergies  Allergen Reactions   Penicillins     Swelling of hands and feet - severe Did it involve swelling of the face/tongue/throat, SOB, or low BP? No Did it involve sudden or severe rash/hives, skin peeling, or any reaction on the inside of your mouth or nose? No Did you need to seek medical attention at a hospital or doctor's office? No When did it last happen?      1950s If all above answers are "NO", may proceed with cephalosporin  use.    Penicillin G Other (See Comments)    PHYSICAL EXAM:  Performance status (ECOG): 0 - Asymptomatic  Vitals:   04/24/21 1540  BP: (!) 131/59  Pulse: 63  Resp: 18  Temp: 97.6 F (36.4 C)  SpO2: 95%   Wt Readings from Last 3 Encounters:  04/24/21 200 lb 6.4 oz (90.9 kg)  02/02/21 194 lb (88 kg)  02/02/21 193 lb (87.5 kg)   Physical Exam Vitals reviewed.  Constitutional:      Appearance: Normal appearance.  Cardiovascular:     Rate and Rhythm: Normal rate and regular rhythm.     Pulses: Normal pulses.     Heart sounds: Normal heart sounds.  Pulmonary:     Effort: Pulmonary effort is normal.     Breath sounds: Normal breath sounds.  Neurological:     General: No focal deficit present.     Mental Status: Jerry Macdonald is alert and oriented to person, place, and time.  Psychiatric:        Mood and Affect: Mood normal.        Behavior: Behavior normal.     LABORATORY DATA:  I have reviewed the labs as listed.  CBC Latest Ref Rng & Units 04/17/2021 01/24/2021 01/19/2021  WBC 4.0 - 10.5 K/uL 7.0 14.0(H) 6.3  Hemoglobin 13.0 - 17.0 g/dL 13.4 10.7(L) 11.7(L)  Hematocrit 39.0 - 52.0 % 40.0 34.5(L) 37.5(L)  Platelets 150 - 400 K/uL 184 185 193   CMP Latest Ref Rng & Units 04/17/2021 02/02/2021 01/24/2021  Glucose 70 - 99 mg/dL 125(H) 91 123(H)  BUN 8 - 23 mg/dL _0 Creatinine 0.61 - 1.24 mg/dL 0.80 0.86 0.70  Sodium 135 - 145 mmol/L 138 141 136  Potassium 3.5 - 5.1 mmol/L 4.5 4.8 4.1  Chloride 98 - 111 mmol/L 105 103 102  CO2 22 - 32 mmol/L  _0 Calcium 8.9 - 10.3 mg/dL 8.8(L) 8.7 8.7(L)  Total Protein 6.5 - 8.1 g/dL 6.7 - -  Total Bilirubin 0.3 - 1.2 mg/dL 1.4(H) - -  Alkaline Phos 38 - 126 U/L 56 - -  AST 15 - 41 U/L 25 - -  ALT 0 - 44 U/L 21 - -    DIAGNOSTIC IMAGING:  I have independently reviewed the scans and discussed with the patient. No results found.   ASSESSMENT:  1.  Cecal adenocarcinoma: -Colonoscopy by Dr. Abbey Chatters on 08/16/2020 with  fungating/infiltrative submucosal and ulcerated partially obstructing mass found in the cecum and at the ileocecal valve. -Biopsy consistent with adenocarcinoma, MMR preserved. -CT CAP on 08/22/2020 with cycle carcinoma with small ileocolic mesenteric lymph nodes with no evidence of distant metastatic spread.   2. Social/family history: -Jerry Macdonald is retired from [employer]. Jerry Macdonald had asbestos exposure. -Maternal and paternal grandfathers died of prostate cancer. Father died of prostate cancer. 2 paternal uncles had prostate cancer.   PLAN:  1.  Stage II (PT3PN0) cecal adenocarcinoma, MMR preserved: - Jerry Macdonald does not report any change in bowel habits or bleeding per rectum. - Last CTAP on 12/29/2020 no evidence of metastatic disease. - Reviewed labs from 04/17/2021 which showed elevated total bilirubin 1.4 and rest of the LFTs normal.  CBC is normal.  CEA was 2.6. - RTC 3 months with repeat labs and CT [facility].   2. Prostate cancer: - Last PSA 4.9 on 04/06/2021.  Continue follow-up with urology.   3. Family history: - I have recommended genetic testing given extensive family history and personal history.  Jerry Macdonald declined.   4. Normocytic anemia: - Hemoglobin improved to 13.4.  Ferritin is still low at 12. - Continue iron tablet 3 times weekly.  Jerry Macdonald is tolerating well.   Orders placed this encounter:  Orders Placed This Encounter  Procedures   CT Abdomen Pelvis W Contrast   CBC with Differential/Platelet   Comprehensive metabolic panel   PSA   Iron and TIBC   Ferritin   CEA     Derek Jack, MD [place] [phone]   I, Thana Ates, am acting as a scribe for Dr. Derek Jack.  I, Derek Jack MD, have reviewed the above documentation for accuracy and completeness, and I agree with the above.

## 2021-04-24 ENCOUNTER — Ambulatory Visit: Payer: Medicare Other | Admitting: "Endocrinology

## 2021-04-24 ENCOUNTER — Inpatient Hospital Stay (HOSPITAL_BASED_OUTPATIENT_CLINIC_OR_DEPARTMENT_OTHER): Payer: Medicare Other | Admitting: Hematology

## 2021-04-24 ENCOUNTER — Encounter (HOSPITAL_COMMUNITY): Payer: Self-pay | Admitting: Hematology

## 2021-04-24 ENCOUNTER — Other Ambulatory Visit: Payer: Self-pay

## 2021-04-24 VITALS — BP 131/59 | HR 63 | Temp 97.6°F | Resp 18 | Wt 200.4 lb

## 2021-04-24 DIAGNOSIS — Z8042 Family history of malignant neoplasm of prostate: Secondary | ICD-10-CM | POA: Diagnosis not present

## 2021-04-24 DIAGNOSIS — Z801 Family history of malignant neoplasm of trachea, bronchus and lung: Secondary | ICD-10-CM | POA: Diagnosis not present

## 2021-04-24 DIAGNOSIS — C180 Malignant neoplasm of cecum: Secondary | ICD-10-CM

## 2021-04-24 DIAGNOSIS — Z8041 Family history of malignant neoplasm of ovary: Secondary | ICD-10-CM | POA: Diagnosis not present

## 2021-04-24 DIAGNOSIS — C61 Malignant neoplasm of prostate: Secondary | ICD-10-CM

## 2021-04-24 DIAGNOSIS — Z87891 Personal history of nicotine dependence: Secondary | ICD-10-CM | POA: Diagnosis not present

## 2021-04-24 NOTE — Patient Instructions (Addendum)
[place] [facility] Discharge Instructions  You were seen today by Dr. Delton Coombes. He went over your recent results. You will be scheduled for a CT scan of your abdomen and pelvis prior to your next visit. Dr. Delton Coombes will see you back in 3 months for labs and follow up.   Thank you for choosing [facility] to provide your oncology and hematology care.  To afford each patient quality time with our provider, please arrive at least 15 minutes before your scheduled appointment time.   If you have a lab appointment with the [facility] please come in thru the Main Entrance and check in at the main information desk  You need to re-schedule your appointment should you arrive 10 or more minutes late.  We strive to give you quality time with our providers, and arriving late affects you and other patients whose appointments are after yours.  Also, if you no show three or more times for appointments you may be dismissed from the clinic at the providers discretion.     Again, thank you for choosing [facility].  Our hope is that these requests will decrease the amount of time that you wait before being seen by our physicians.       _____________________________________________________________  Should you have questions after your visit to [facility], please contact our office at [phone] between the hours of 8:00 a.m. and 4:30 p.m.  Voicemails left after 4:00 p.m. will not be returned until the following business day.  For prescription refill requests, have your pharmacy contact our office and allow 72 hours.    Cancer Center Support Programs:   > Cancer Support Group  2nd Tuesday of the month 1pm-2pm, Journey Room

## 2021-05-03 NOTE — Telephone Encounter (Signed)
error 

## 2021-05-25 DIAGNOSIS — H02834 Dermatochalasis of left upper eyelid: Secondary | ICD-10-CM | POA: Diagnosis not present

## 2021-05-25 DIAGNOSIS — H25813 Combined forms of age-related cataract, bilateral: Secondary | ICD-10-CM | POA: Diagnosis not present

## 2021-05-25 DIAGNOSIS — H43822 Vitreomacular adhesion, left eye: Secondary | ICD-10-CM | POA: Diagnosis not present

## 2021-05-25 DIAGNOSIS — H2181 Floppy iris syndrome: Secondary | ICD-10-CM | POA: Diagnosis not present

## 2021-05-25 DIAGNOSIS — H02831 Dermatochalasis of right upper eyelid: Secondary | ICD-10-CM | POA: Diagnosis not present

## 2021-05-25 DIAGNOSIS — H353111 Nonexudative age-related macular degeneration, right eye, early dry stage: Secondary | ICD-10-CM | POA: Diagnosis not present

## 2021-05-25 DIAGNOSIS — H5703 Miosis: Secondary | ICD-10-CM | POA: Diagnosis not present

## 2021-06-04 DIAGNOSIS — Z23 Encounter for immunization: Secondary | ICD-10-CM | POA: Diagnosis not present

## 2021-06-08 DIAGNOSIS — Z23 Encounter for immunization: Secondary | ICD-10-CM | POA: Diagnosis not present

## 2021-06-27 DIAGNOSIS — Z0001 Encounter for general adult medical examination with abnormal findings: Secondary | ICD-10-CM | POA: Diagnosis not present

## 2021-06-29 ENCOUNTER — Telehealth: Payer: Self-pay | Admitting: Internal Medicine

## 2021-06-29 DIAGNOSIS — K2270 Barrett's esophagus without dysplasia: Secondary | ICD-10-CM

## 2021-06-29 DIAGNOSIS — Z0000 Encounter for general adult medical examination without abnormal findings: Secondary | ICD-10-CM

## 2021-06-29 MED ORDER — PANTOPRAZOLE SODIUM 40 MG PO TBEC: DELAYED_RELEASE_TABLET | ORAL | 3 refills | Status: DC

## 2021-06-29 NOTE — Telephone Encounter (Signed)
Pt would like a Rx for Protonix 40 mg to be sent to [facility]

## 2021-06-29 NOTE — Telephone Encounter (Signed)
Printed off RX that can be faxed. I could not find [facility] listed in Epic.

## 2021-06-29 NOTE — Telephone Encounter (Signed)
Pt came to the front desk saying he needed his prescription of protonix 40 mg twice a day sent to [facility].

## 2021-06-29 NOTE — Telephone Encounter (Signed)
noted 

## 2021-06-29 NOTE — Telephone Encounter (Signed)
Spoke to pt's POA Izora Gala) and was advised it is just [employer] [facility]. Faxed with confirmation back.

## 2021-07-05 ENCOUNTER — Other Ambulatory Visit: Payer: Self-pay | Admitting: Gastroenterology

## 2021-07-05 DIAGNOSIS — K2270 Barrett's esophagus without dysplasia: Secondary | ICD-10-CM

## 2021-07-05 DIAGNOSIS — Z0000 Encounter for general adult medical examination without abnormal findings: Secondary | ICD-10-CM

## 2021-07-05 MED ORDER — PANTOPRAZOLE SODIUM 40 MG PO TBEC: DELAYED_RELEASE_TABLET | ORAL | 3 refills | Status: DC

## 2021-07-05 NOTE — Progress Notes (Signed)
First RX was faxed without signature, sent electronically this time.

## 2021-07-13 ENCOUNTER — Ambulatory Visit (INDEPENDENT_AMBULATORY_CARE_PROVIDER_SITE_OTHER): Payer: Medicare Other | Admitting: General Surgery

## 2021-07-13 ENCOUNTER — Other Ambulatory Visit: Payer: Self-pay

## 2021-07-13 ENCOUNTER — Encounter: Payer: Self-pay | Admitting: General Surgery

## 2021-07-13 VITALS — BP 113/96 | HR 69 | Temp 98.6°F | Resp 16 | Ht 70.5 in | Wt 200.0 lb

## 2021-07-13 DIAGNOSIS — C180 Malignant neoplasm of cecum: Secondary | ICD-10-CM | POA: Diagnosis not present

## 2021-07-13 NOTE — Patient Instructions (Signed)
Will look for your scan 11/18 and follow up to see if a belly button hernia present.

## 2021-07-13 NOTE — Progress Notes (Signed)
[facility] Note   HPI:  87 y.o. Male presents to clinic for follow-up evaluation of for concern for hernia. His friend has noticed a slight bulge at the umbilicus. He has no pain. He is eating and having regular BM.   Review of Systems:  No nausea Regular Bms Continued distention, chronic  All other review of systems: otherwise negative   Vital Signs:  BP (!) 113/96   Pulse 69   Temp 98.6 F (37 C) (Other (Comment))   Resp 16   Ht 5' 10.5" (1.791 m)   Wt 200 lb (90.7 kg)   SpO2 96%   BMI 28.29 kg/m    Physical Exam:  Physical Exam Vitals reviewed.  Cardiovascular:     Rate and Rhythm: Normal rate.  Pulmonary:     Effort: Pulmonary effort is normal.  Abdominal:     General: There is distension.     Palpations: Abdomen is soft.     Tenderness: There is no abdominal tenderness.     Comments: No obvious fascial defect, bulge of tissue but cannot appreciate defect at umbilical area  Neurological:     Mental Status: He is alert.     Assessment:  87 y.o. yo Male s/p laparoscopic right hemicolectomy. He has always been more distended but his friend thinks she has seen a hernia. He is scheduled for a CT next week for cancer follow up.  Plan:  - Will follow up on CT and call patient/friend to discuss, if hernia will discuss repair  - PRN Follow up     Curlene Labrum, MD [facility] [address] [place], [place] [address] [phone] (office)

## 2021-07-17 ENCOUNTER — Other Ambulatory Visit: Payer: Self-pay

## 2021-07-17 ENCOUNTER — Other Ambulatory Visit: Payer: Medicare Other

## 2021-07-17 DIAGNOSIS — C61 Malignant neoplasm of prostate: Secondary | ICD-10-CM

## 2021-07-17 DIAGNOSIS — R972 Elevated prostate specific antigen [PSA]: Secondary | ICD-10-CM | POA: Diagnosis not present

## 2021-07-17 NOTE — Progress Notes (Unsigned)
PSA

## 2021-07-18 LAB — PSA: Prostate Specific Ag, Serum: 5.2 ng/mL — ABNORMAL HIGH (ref 0.0–4.0)

## 2021-07-20 ENCOUNTER — Other Ambulatory Visit: Payer: Self-pay | Admitting: "Endocrinology

## 2021-07-20 DIAGNOSIS — E890 Postprocedural hypothyroidism: Secondary | ICD-10-CM

## 2021-07-21 ENCOUNTER — Other Ambulatory Visit: Payer: Self-pay

## 2021-07-21 ENCOUNTER — Inpatient Hospital Stay (HOSPITAL_COMMUNITY): Payer: Medicare Other | Attending: Hematology

## 2021-07-21 ENCOUNTER — Ambulatory Visit (HOSPITAL_COMMUNITY)
Admission: RE | Admit: 2021-07-21 | Discharge: 2021-07-21 | Disposition: A | Discharge status: Home / Self Care | Payer: Medicare Other | Source: Ambulatory Visit | Attending: Hematology | Admitting: Hematology

## 2021-07-21 DIAGNOSIS — Z801 Family history of malignant neoplasm of trachea, bronchus and lung: Secondary | ICD-10-CM | POA: Insufficient documentation

## 2021-07-21 DIAGNOSIS — Z85038 Personal history of other malignant neoplasm of large intestine: Secondary | ICD-10-CM | POA: Insufficient documentation

## 2021-07-21 DIAGNOSIS — Z8041 Family history of malignant neoplasm of ovary: Secondary | ICD-10-CM | POA: Insufficient documentation

## 2021-07-21 DIAGNOSIS — Z8042 Family history of malignant neoplasm of prostate: Secondary | ICD-10-CM | POA: Insufficient documentation

## 2021-07-21 DIAGNOSIS — C180 Malignant neoplasm of cecum: Secondary | ICD-10-CM

## 2021-07-21 DIAGNOSIS — Z8546 Personal history of malignant neoplasm of prostate: Secondary | ICD-10-CM | POA: Diagnosis not present

## 2021-07-21 DIAGNOSIS — K4020 Bilateral inguinal hernia, without obstruction or gangrene, not specified as recurrent: Secondary | ICD-10-CM | POA: Diagnosis not present

## 2021-07-21 DIAGNOSIS — K449 Diaphragmatic hernia without obstruction or gangrene: Secondary | ICD-10-CM | POA: Diagnosis not present

## 2021-07-21 DIAGNOSIS — Z87891 Personal history of nicotine dependence: Secondary | ICD-10-CM | POA: Insufficient documentation

## 2021-07-21 DIAGNOSIS — C61 Malignant neoplasm of prostate: Secondary | ICD-10-CM | POA: Diagnosis not present

## 2021-07-21 DIAGNOSIS — C189 Malignant neoplasm of colon, unspecified: Secondary | ICD-10-CM | POA: Diagnosis not present

## 2021-07-21 DIAGNOSIS — D649 Anemia, unspecified: Secondary | ICD-10-CM | POA: Insufficient documentation

## 2021-07-21 DIAGNOSIS — Z79899 Other long term (current) drug therapy: Secondary | ICD-10-CM | POA: Insufficient documentation

## 2021-07-21 LAB — IRON AND TIBC
Iron: 136 ug/dL (ref 45–182)
Saturation Ratios: 40 % — ABNORMAL HIGH (ref 17.9–39.5)
TIBC: 341 ug/dL (ref 250–450)
UIBC: 205 ug/dL

## 2021-07-21 LAB — CBC WITH DIFFERENTIAL/PLATELET
Abs Immature Granulocytes: 0.03 10*3/uL (ref 0.00–0.07)
Basophils Absolute: 0 10*3/uL (ref 0.0–0.1)
Basophils Relative: 0 %
Eosinophils Absolute: 0.2 10*3/uL (ref 0.0–0.5)
Eosinophils Relative: 2 %
HCT: 43.2 % (ref 39.0–52.0)
Hemoglobin: 14.9 g/dL (ref 13.0–17.0)
Immature Granulocytes: 0 %
Lymphocytes Relative: 27 %
Lymphs Abs: 2 10*3/uL (ref 0.7–4.0)
MCH: 34.7 pg — ABNORMAL HIGH (ref 26.0–34.0)
MCHC: 34.5 g/dL (ref 30.0–36.0)
MCV: 100.5 fL — ABNORMAL HIGH (ref 80.0–100.0)
Monocytes Absolute: 0.9 10*3/uL (ref 0.1–1.0)
Monocytes Relative: 13 %
Neutro Abs: 4.2 10*3/uL (ref 1.7–7.7)
Neutrophils Relative %: 58 %
Platelets: 191 10*3/uL (ref 150–400)
RBC: 4.3 MIL/uL (ref 4.22–5.81)
RDW: 13.2 % (ref 11.5–15.5)
WBC: 7.3 10*3/uL (ref 4.0–10.5)
nRBC: 0 % (ref 0.0–0.2)

## 2021-07-21 LAB — COMPREHENSIVE METABOLIC PANEL
ALT: 21 U/L (ref 0–44)
AST: 26 U/L (ref 15–41)
Albumin: 4.3 g/dL (ref 3.5–5.0)
Alkaline Phosphatase: 61 U/L (ref 38–126)
Anion gap: 8 (ref 5–15)
BUN: 21 mg/dL (ref 8–23)
CO2: 29 mmol/L (ref 22–32)
Calcium: 9.2 mg/dL (ref 8.9–10.3)
Chloride: 100 mmol/L (ref 98–111)
Creatinine, Ser: 0.8 mg/dL (ref 0.61–1.24)
GFR, Estimated: >60 mL/min (ref >60)
Glucose, Bld: 102 mg/dL — ABNORMAL HIGH (ref 70–99)
Potassium: 4.4 mmol/L (ref 3.5–5.1)
Sodium: 137 mmol/L (ref 135–145)
Total Bilirubin: 1.9 mg/dL — ABNORMAL HIGH (ref 0.3–1.2)
Total Protein: 7.2 g/dL (ref 6.5–8.1)

## 2021-07-21 LAB — FERRITIN: Ferritin: 16 ng/mL — ABNORMAL LOW (ref 24–336)

## 2021-07-21 MED ORDER — IOHEXOL 300 MG/ML  SOLN
85.0000 mL | Freq: Once | INTRAMUSCULAR | Status: AC | PRN
  Administered 2021-07-21: 85 mL via INTRAVENOUS

## 2021-07-22 LAB — CEA: CEA: 2.9 ng/mL (ref 0.0–4.7)

## 2021-07-23 ENCOUNTER — Telehealth (INDEPENDENT_AMBULATORY_CARE_PROVIDER_SITE_OTHER): Payer: Medicare Other | Admitting: General Surgery

## 2021-07-23 DIAGNOSIS — R140 Abdominal distension (gaseous): Secondary | ICD-10-CM

## 2021-07-23 NOTE — Telephone Encounter (Signed)
[facility]  CT done to check for cancer recurrence. No ventral hernia to account for where his umbilicus was feeling spongy. He does have very thin skin and fat between him and his intestine only about 1cm.   Bilateral inguinal hernias with fat. Discussed reasons to fix and if not symptomatic to just watch them.   Curlene Labrum, MD [facility] [place], [place] [address] [phone] (office)  CT ABD/PEL CLINICAL DATA:  Colorectal cancer, surveillance; prostate cancer, surveillance; cecal cancer surveillance.   EXAM: CT ABDOMEN AND PELVIS WITH CONTRAST   TECHNIQUE: Multidetector CT imaging of the abdomen and pelvis was performed using the standard protocol following bolus administration of intravenous contrast.   CONTRAST:  33mL OMNIPAQUE IOHEXOL 300 MG/ML  SOLN   COMPARISON:  CT abdomen and pelvis 12/29/2020; CT chest-abdomen-pelvis 08/22/2020.   FINDINGS: Lower chest: No acute abnormality. 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   Hepatobiliary: Stable cyst within segment II of the left hepatic lobe measuring 1.8 cm. No suspicious liver abnormality identified. Gallbladder normal.   Pancreas: Unremarkable. No pancreatic ductal dilatation or surrounding inflammatory changes.   Spleen: Normal in size without focal abnormality. Normal adrenal glands.   Adrenals/Urinary Tract: No kidney mass or nephrolithiasis identified bilaterally. Right-sided pelvocaliectasis. The right ureter is noted extending into the patient's right inguinal hernia. This is similar to previous exam. Bladder is unremarkable.   Stomach/Bowel: Small hiatal hernia. Status post right hemicolectomy. Colonic diverticulosis identified without signs of acute diverticulitis. No bowel wall thickening, inflammation, or distension identified.   Vascular/Lymphatic: Aortic  atherosclerosis. No signs of abdominal aortic aneurysm. No abdominopelvic adenopathy.   Reproductive: Prostate gland enlargement.   Other: Bilateral inguinal hernias are identified which contain fat. On the right, the right ureter extends into the right hernia but does not appear dilated. No ascites or fluid collections within the abdomen or pelvis.   Musculoskeletal: Degenerative disc disease is noted within the lumbar spine, most advanced at L5-S1. There is 5 mm anterolisthesis of L4 on L5 is noted. Bones appear osteopenic. No acute or suspicious osseous findings. Remote healed right posterior rib fractures.   IMPRESSION: 1. No acute findings identified within the abdomen or pelvis. 2. Status post right hemicolectomy. No findings of recurrent or metastatic disease. 3. Bilateral inguinal hernias which contain fat. The right ureter extends into the patient's right inguinal hernia but does not appear dilated. 4. 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 5. Aortic atherosclerosis (ICD10-I70.0).     Electronically Signed   By: Kerby Moors M.D.   On: 07/21/2021 14:38

## 2021-07-26 ENCOUNTER — Ambulatory Visit (HOSPITAL_COMMUNITY): Payer: Medicare Other | Admitting: Hematology

## 2021-07-28 DIAGNOSIS — E890 Postprocedural hypothyroidism: Secondary | ICD-10-CM | POA: Diagnosis not present

## 2021-07-29 LAB — T4, FREE: Free T4: 1.12 ng/dL (ref 0.82–1.77)

## 2021-07-29 LAB — TSH: TSH: 7.19 u[IU]/mL — ABNORMAL HIGH (ref 0.450–4.500)

## 2021-07-31 NOTE — Progress Notes (Signed)
[place]erry Macd[place], [place] [address]   CLINIC:  Medical Oncology/Hematology  PCP:  [name], MD [address] [place] [address] [phone]   REASON FOR VISIT:  Follow-up for cecal adenocarcinoma  PRIOR THERAPY: Laparoscopic right hemicolectomy on 09/28/2020  NGS Results: not done  CURRENT THERAPY: Oral iron tablet QOD  BRIEF ONCOLOGIC HISTORY:  Oncology History   No history exists.    CANCER STAGING:  Cancer Staging  No matching staging information was found for the patient.  INTERVAL HISTORY:  Jerry Macdonald, a 87 y.o. 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 Buck was last seen on 04/24/2021.   Today he reports feeling good. He is taking iron 3 times weekly and is tolerating it well.   REVIEW OF SYSTEMS:  Review of Systems  Constitutional:  Negative for appetite change and fatigue (75%).  All other systems reviewed and are negative.  PAST MEDICAL/SURGICAL HISTORY:  Past Medical History:  Diagnosis Date   Abnormal prostate biopsy    Back pain    with radiculopathy   Cataracts, bilateral    Constipation    Family history of lung cancer    Family history of ovarian cancer    Family history of prostate cancer    Goiter    HOH (hard of hearing)    Hyperlipidemia    Hypertrophy of prostate    Malaise and fatigue    Obesity    Pyogenic granuloma    left cheeck   S/P colonoscopy June 2011   rectal bleeding secondary to hemorrhoids   S/P colonoscopy 2007   [facility], diverticulosis per Dr. Juel Burrow note   S/P endoscopy June 2011   Barrett's, mild gastritis and duodenitis   Seborrheic keratosis    Sleep apnea    Past Surgical History:  Procedure Laterality Date   BIOPSY  03/27/2019   Procedure: BIOPSY;  Surgeon: Danie Binder, MD;  [facility];  Service: Endoscopy;;   BIOPSY  08/16/2020   Procedure: BIOPSY;  Surgeon: Eloise Harman, DO;  Location[facility];  Service: Endoscopy;;   COLONOSCOPY  JUN 2011   PAN-COLONIC DIVERTICULOSIS   [place] [facility]   COLONOSCOPY WITH PROPOFOL N/A 08/16/2020   Procedure: COLONOSCOPY WITH PROPOFOL;  Surgeon: Eloise Harman, DO;  [facility];  Service: Endoscopy;  Laterality: N/A;  10:45am   ESOPHAGOGASTRODUODENOSCOPY  06/11/2011   Procedure: ESOPHAGOGASTRODUODENOSCOPY (EGD);  Surgeon: Dorothyann Peng, MD;  [facility];  Service: Endoscopy;  Laterality: N/A;  11:00   ESOPHAGOGASTRODUODENOSCOPY N/A 05/21/2014   Dr. Fields:Barrett's esophagus/ Medium sized hiatal hernia. path with indefinite dysplasia.    ESOPHAGOGASTRODUODENOSCOPY N/A 12/20/2014   Dr. Clayburn Pert gastric polyp/large HH/6 cm segment of Barrett's esophagus, path with hyperplastic gastric polyp, no dysplasia. Surveillance April 2017 due    ESOPHAGOGASTRODUODENOSCOPY N/A 01/20/2016   Procedure: ESOPHAGOGASTRODUODENOSCOPY (EGD);  Surgeon: Danie Binder, MD;  [facility];  Service: Endoscopy;  Laterality: N/A;  1230pm   ESOPHAGOGASTRODUODENOSCOPY N/A 03/27/2019   Procedure: ESOPHAGOGASTRODUODENOSCOPY (EGD);  Surgeon: Danie Binder, MD;  [facility];  Service: Endoscopy;  Laterality: N/A;  12:15pm   HERNIA REPAIR     LAPAROSCOPIC PARTIAL COLECTOMY Right 09/28/2020   Procedure: LAPAROSCOPIC RIGHT HEMI COLECTOMY;  Surgeon: Virl Cagey, MD;  Location: [facility];  Service: General;  Laterality: Right;   THYROIDECTOMY N/A 01/23/2021  Procedure: TOTAL THYROIDECTOMY;  Surgeon: Virl Cagey, MD;  Location: [facility];  Service: General;  Laterality: N/A;   UMBILICAL HERNIA REPAIR     VARICOSE VEIN SURGERY      SOCIAL HISTORY:  Social History   Socioeconomic History   Marital status: Divorced    Spouse name: Not on file   Number of children: Not on file   Years of education: Not on file   Highest education level: Not on file  Occupational History   Not on file  Tobacco Use    Smoking status: Former    Packs/day: 0.50    Years: 2.00    Pack years: 1.00    Types: Cigarettes    Quit date: 09/10/1963    Years since quitting: 57.9   Smokeless tobacco: Never   Tobacco comments:    quit in 1965  Vaping Use   Vaping Use: Former  Substance and Sexual Activity   Alcohol use: Not Currently   Drug use: No   Sexual activity: Not Currently  Other Topics Concern   Not on file  Social History Narrative   Not on file   Social Determinants of Health   Financial Resource Strain: Not on file  Food Insecurity: Not on file  Transportation Needs: No Transportation Needs   Lack of Transportation (Medical): No   Lack of Transportation (Non-Medical): No  Physical Activity: Inactive   Days of Exercise per Week: 0 days   Minutes of Exercise per Session: 0 min  Stress: Not on file  Social Connections: Not on file  Intimate Partner Violence: Not At Risk   Fear of Current or Ex-Partner: No   Emotionally Abused: No   Physically Abused: No   Sexually Abused: No    FAMILY HISTORY:  Family History  Problem Relation Age of Onset   Prostate cancer Father 87       metastatic   Lung cancer Brother 38       heavy smoker   Prostate cancer Paternal Uncle        metastatic   Alzheimer's disease Maternal Grandmother    Prostate cancer Maternal Grandfather 82       metastatic   Prostate cancer Paternal Grandfather 60       metastatic   Ovarian cancer Paternal Aunt 38   Prostate cancer Paternal Uncle        metastatic   Cancer Cousin 33       unknown type, paternal first cousin   Liver cancer Cousin        dx 43s, paternal first cousin   Colon polyps Neg Hx    Colon cancer Neg Hx     CURRENT MEDICATIONS:  Current Outpatient Medications  Medication Sig Dispense Refill   calcium-vitamin D (OSCAL WITH D) 500-200 MG-UNIT tablet Take 1 tablet by mouth 3 (three) times daily. 90 tablet 1   Cholecalciferol (VITAMIN D3) 25 MCG (1000 UT) CAPS Take 1 capsule by mouth 3 (three)  times daily.     ferrous sulfate 324 MG TBEC Take 324 mg by mouth every other day.     finasteride (PROSCAR) 5 MG tablet Take 1 tablet (5 mg total) by mouth daily. 90 tablet 3   levothyroxine (SYNTHROID) 75 MCG tablet TAKE 1 TABLET DAILY AT 6 A.M. 90 tablet 1   magnesium oxide (MAG-OX) 400 MG tablet Take 400 mg by mouth 2 (two) times daily.     Multiple Vitamins-Minerals (MULTIVITAMINS THER. W/MINERALS) TABS Take 1 tablet by mouth  daily.     pantoprazole (PROTONIX) 40 MG tablet Take one tablet once daily before a meal, can take second tablet before evening if needed. 180 tablet 3   tamsulosin (FLOMAX) 0.4 MG CAPS capsule Take 1 capsule (0.4 mg total) by mouth daily. 90 capsule 3   acetaminophen (TYLENOL) 500 MG tablet Take 500 mg by mouth every 8 (eight) hours as needed for moderate pain. (Patient not taking: Reported on 08/01/2021)     ondansetron (ZOFRAN-ODT) 4 MG disintegrating tablet Take 1 tablet (4 mg total) by mouth every 6 (six) hours as needed for nausea. (Patient not taking: Reported on 08/01/2021) 20 tablet 0   traMADol (ULTRAM) 50 MG tablet Take 1 tablet (50 mg total) by mouth every 6 (six) hours as needed for moderate pain or severe pain. (Patient not taking: Reported on 08/01/2021) 30 tablet 0   No current facility-administered medications for this visit.    ALLERGIES:  Allergies  Allergen Reactions   Penicillins     Swelling of hands and feet - severe Did it involve swelling of the face/tongue/throat, SOB, or low BP? No Did it involve sudden or severe rash/hives, skin peeling, or any reaction on the inside of your mouth or nose? No Did you need to seek medical attention at a hospital or doctor's office? No When did it last happen?      1950s If all above answers are "NO", may proceed with cephalosporin use.    Penicillin G Other (See Comments)    PHYSICAL EXAM:  Performance status (ECOG): 0 - Asymptomatic  Vitals:   08/01/21 1519  BP: 126/70  Pulse: 76  Resp: 20   Temp: 97.7 F (36.5 C)  SpO2: 98%   Wt Readings from Last 3 Encounters:  08/01/21 199 lb 8 oz (90.5 kg)  07/13/21 200 lb (90.7 kg)  04/24/21 200 lb 6.4 oz (90.9 kg)   Physical Exam Vitals reviewed.  Constitutional:      Appearance: Normal appearance.  Cardiovascular:     Rate and Rhythm: Normal rate and regular rhythm.     Pulses: Normal pulses.     Heart sounds: Normal heart sounds.  Pulmonary:     Effort: Pulmonary effort is normal.     Breath sounds: Normal breath sounds.  Neurological:     General: No focal deficit present.     Mental Status: He is alert and oriented to person, place, and time.  Psychiatric:        Mood and Affect: Mood normal.        Behavior: Behavior normal.     LABORATORY DATA:  I have reviewed the labs as listed.  CBC Latest Ref Rng & Units 07/21/2021 04/17/2021 01/24/2021  WBC 4.0 - 10.5 K/uL 7.3 7.0 14.0(H)  Hemoglobin 13.0 - 17.0 g/dL 14.9 13.4 10.7(L)  Hematocrit 39.0 - 52.0 % 43.2 40.0 34.5(L)  Platelets 150 - 400 K/uL 191 184 185   CMP Latest Ref Rng & Units 07/21/2021 04/17/2021 02/02/2021  Glucose 70 - 99 mg/dL 102(H) 125(H) 91  BUN 8 - 23 mg/dL 21 19 17   Creatinine 0.61 - 1.24 mg/dL 0.80 0.80 0.86  Sodium 135 - 145 mmol/L 137 138 141  Potassium 3.5 - 5.1 mmol/L 4.4 4.5 4.8  Chloride 98 - 111 mmol/L 100 105 103  CO2 22 - 32 mmol/L 29 28 26   Calcium 8.9 - 10.3 mg/dL 9.2 8.8(L) 8.7  Total Protein 6.5 - 8.1 g/dL 7.2 6.7 -  Total Bilirubin 0.3 - 1.2 mg/dL  1.9(H) 1.4(H) -  Alkaline Phos 38 - 126 U/L 61 56 -  AST 15 - 41 U/L 26 25 -  ALT 0 - 44 U/L 21 21 -    DIAGNOSTIC IMAGING:  I have independently reviewed the scans and discussed with the patient. CT Abdomen Pelvis W Contrast  Result Date: 07/21/2021 CLINICAL DATA:  Colorectal cancer, surveillance; prostate cancer, surveillance; cecal cancer surveillance. EXAM: CT ABDOMEN AND PELVIS WITH CONTRAST TECHNIQUE: Multidetector CT imaging of the abdomen and pelvis was performed using the  standard protocol following bolus administration of intravenous contrast. CONTRAST:  62m OMNIPAQUE IOHEXOL 300 MG/ML  SOLN COMPARISON:  CT abdomen and pelvis 12/29/2020; CT chest-abdomen-pelvis 08/22/2020. FINDINGS: Lower chest: No acute abnormality. 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 Hepatobiliary: Stable cyst within segment II of the left hepatic lobe measuring 1.8 cm. No suspicious liver abnormality identified. Gallbladder normal. Pancreas: Unremarkable. No pancreatic ductal dilatation or surrounding inflammatory changes. Spleen: Normal in size without focal abnormality. Normal adrenal glands. Adrenals/Urinary Tract: No kidney mass or nephrolithiasis identified bilaterally. Right-sided pelvocaliectasis. The right ureter is noted extending into the patient's right inguinal hernia. This is similar to previous exam. Bladder is unremarkable. Stomach/Bowel: Small hiatal hernia. Status post right hemicolectomy. Colonic diverticulosis identified without signs of acute diverticulitis. No bowel wall thickening, inflammation, or distension identified. Vascular/Lymphatic: Aortic atherosclerosis. No signs of abdominal aortic aneurysm. No abdominopelvic adenopathy. Reproductive: Prostate gland enlargement. Other: Bilateral inguinal hernias are identified which contain fat. On the right, the right ureter extends into the right hernia but does not appear dilated. No ascites or fluid collections within the abdomen or pelvis. Musculoskeletal: Degenerative disc disease is noted within the lumbar spine, most advanced at L5-S1. There is 5 mm anterolisthesis of L4 on L5 is noted. Bones appear osteopenic. No acute or suspicious osseous findings. Remote healed right posterior rib fractures. IMPRESSION: 1. No acute findings identified within the abdomen or pelvis. 2. Status post right hemicolectomy. No findings of recurrent or metastatic disease. 3. Bilateral inguinal hernias  which contain fat. The right ureter extends into the patient's right inguinal hernia but does not appear dilated. 4. 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 5. Aortic atherosclerosis (ICD10-I70.0). Electronically Signed   By: TKerby MoorsM.D.   On: 07/21/2021 14:38     ASSESSMENT:  1.  Cecal adenocarcinoma: -Colonoscopy by Dr. CAbbey Chatterson 08/16/2020 with fungating/infiltrative submucosal and ulcerated partially obstructing mass found in the cecum and at the ileocecal valve. -Biopsy consistent with adenocarcinoma, MMR preserved. -CT CAP on 08/22/2020 with cycle carcinoma with small ileocolic mesenteric lymph nodes with no evidence of distant metastatic spread.   2. Social/family history: -He is retired from [employer] He had asbestos exposure. -Maternal and paternal grandfathers died of prostate cancer. Father died of prostate cancer. 2 paternal uncles had prostate cancer.   PLAN:  1.  Stage II (PT3PN0) cecal adenocarcinoma, MMR preserved: - Denies any bleeding per rectum or change in bowel habits. - Reviewed CTAP from 07/21/2021 which did not show any evidence of recurrence or metastatic disease. - Labs indicate CEA was normal.  CBC and CMP were normal.  Bilirubin was 1.9. - RTC 3 months for follow-up with repeat labs.   2. Prostate cancer: - Last PSA 5.2 on 07/17/2021.  Continue follow-up with urology.   3. Family history: - Genetic testing was recommended with extensive family history and personal history.  He declined.   4. Normocytic anemia: - Ferritin is 16  and hemoglobin improved to 14.9. - Continue iron tablet 3 times weekly.   Orders placed this encounter:  No orders of the defined types were placed in this encounter.    Derek Jack, MD [place] [phone]   I, Thana Ates, am acting as a scribe for Dr. Derek Jack.  I, Derek Jack MD, have reviewed the above documentation for  accuracy and completeness, and I agree with the above.

## 2021-08-01 ENCOUNTER — Inpatient Hospital Stay (HOSPITAL_BASED_OUTPATIENT_CLINIC_OR_DEPARTMENT_OTHER): Payer: Medicare Other | Admitting: Hematology

## 2021-08-01 ENCOUNTER — Other Ambulatory Visit: Payer: Self-pay

## 2021-08-01 VITALS — BP 126/70 | HR 76 | Temp 97.7°F | Resp 20 | Wt 199.5 lb

## 2021-08-01 DIAGNOSIS — C61 Malignant neoplasm of prostate: Secondary | ICD-10-CM | POA: Diagnosis not present

## 2021-08-01 DIAGNOSIS — C180 Malignant neoplasm of cecum: Secondary | ICD-10-CM

## 2021-08-01 DIAGNOSIS — Z87891 Personal history of nicotine dependence: Secondary | ICD-10-CM | POA: Insufficient documentation

## 2021-08-01 DIAGNOSIS — D649 Anemia, unspecified: Secondary | ICD-10-CM | POA: Insufficient documentation

## 2021-08-01 DIAGNOSIS — Z8546 Personal history of malignant neoplasm of prostate: Secondary | ICD-10-CM | POA: Insufficient documentation

## 2021-08-01 DIAGNOSIS — Z8041 Family history of malignant neoplasm of ovary: Secondary | ICD-10-CM | POA: Diagnosis not present

## 2021-08-01 DIAGNOSIS — Z8042 Family history of malignant neoplasm of prostate: Secondary | ICD-10-CM | POA: Insufficient documentation

## 2021-08-01 DIAGNOSIS — Z85038 Personal history of other malignant neoplasm of large intestine: Secondary | ICD-10-CM | POA: Insufficient documentation

## 2021-08-01 DIAGNOSIS — Z79899 Other long term (current) drug therapy: Secondary | ICD-10-CM | POA: Diagnosis not present

## 2021-08-01 DIAGNOSIS — Z801 Family history of malignant neoplasm of trachea, bronchus and lung: Secondary | ICD-10-CM | POA: Diagnosis not present

## 2021-08-01 IMAGING — US US THYROID
1 series · 13 of 25 positions shown · non-contrast
Comparison: Chest CT-08/22/2020

CLINICAL DATA: Incidental on CT. Thyroid nodules incidentally noted
on chest CT performed 08/22/2020

EXAM:
THYROID ULTRASOUND
TECHNIQUE: Ultrasound examination of the thyroid gland and adjacent soft
tissues was performed.

[Series 1: us thyroid · 13 of 93 slices shown]
[im 1/93]
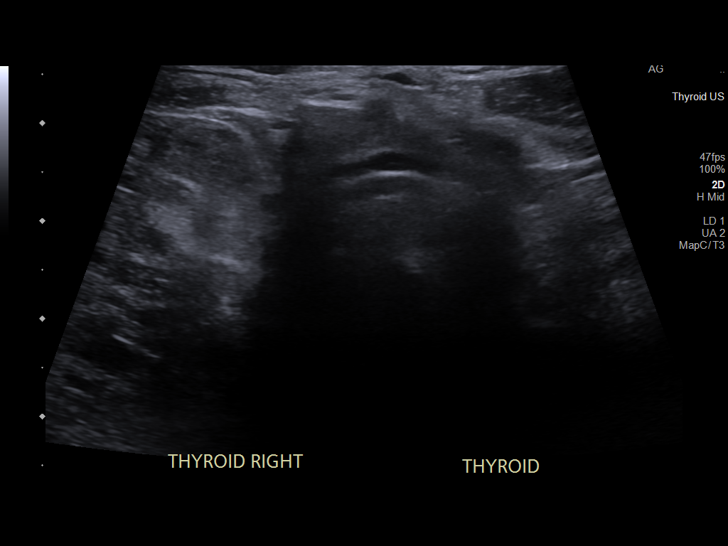
[im 8/93]
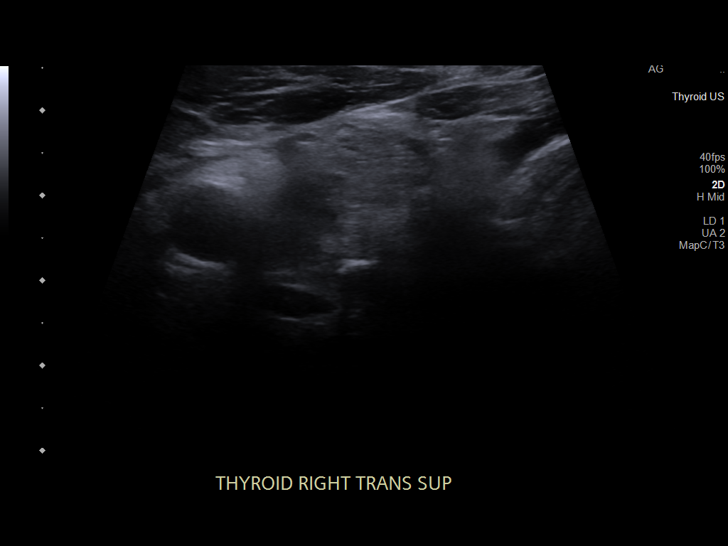
[im 16/93]
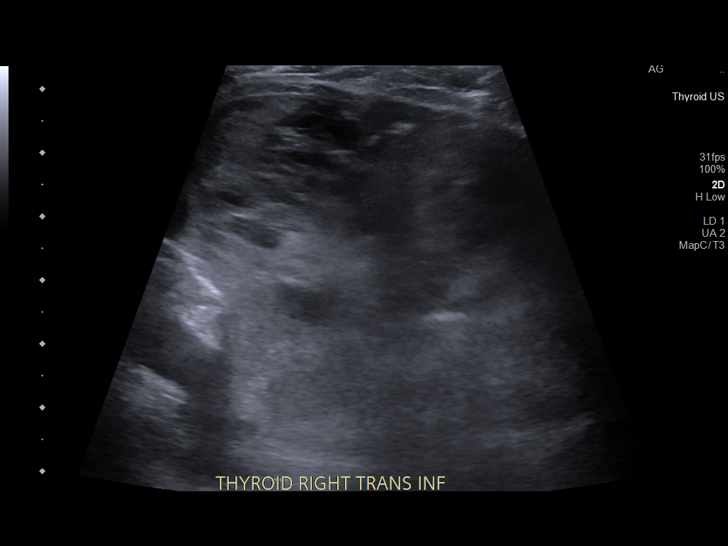
[im 24/93]
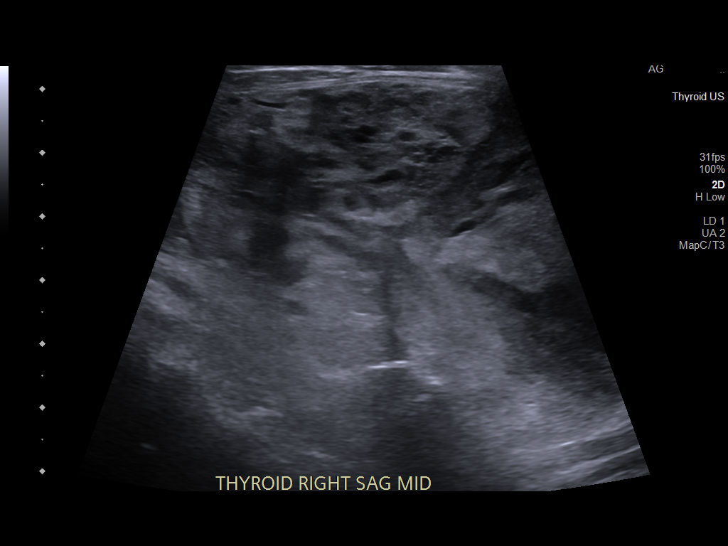
[im 31/93]
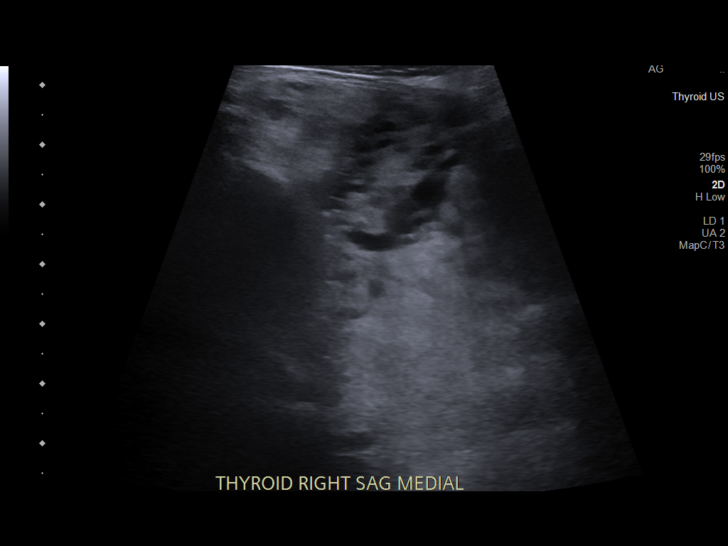
[im 39/93]
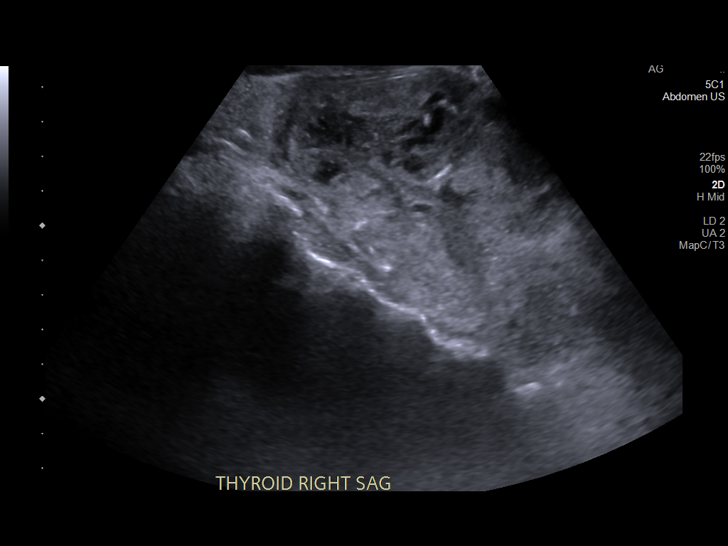
[im 47/93]
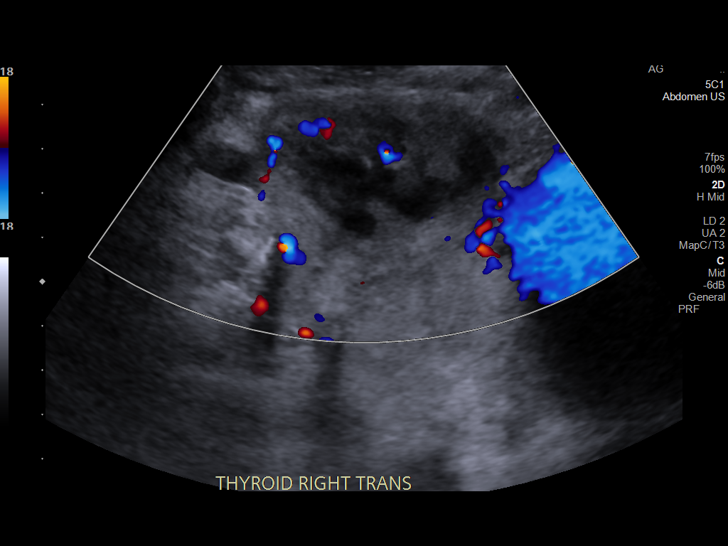
[im 54/93]
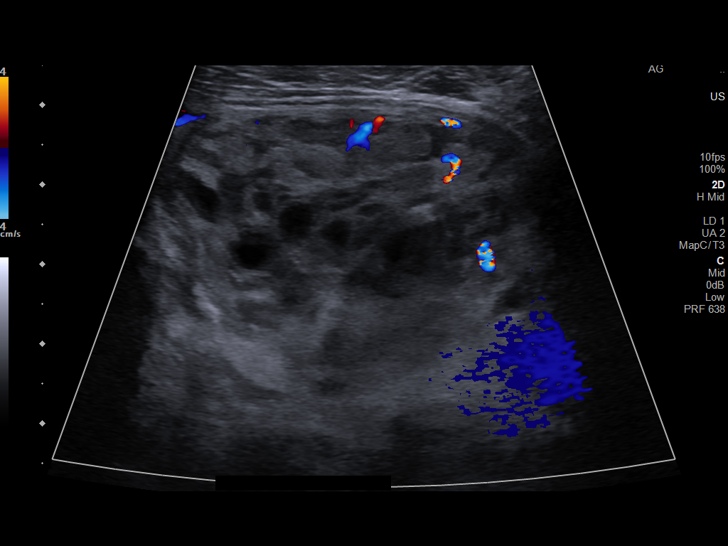
[im 62/93]
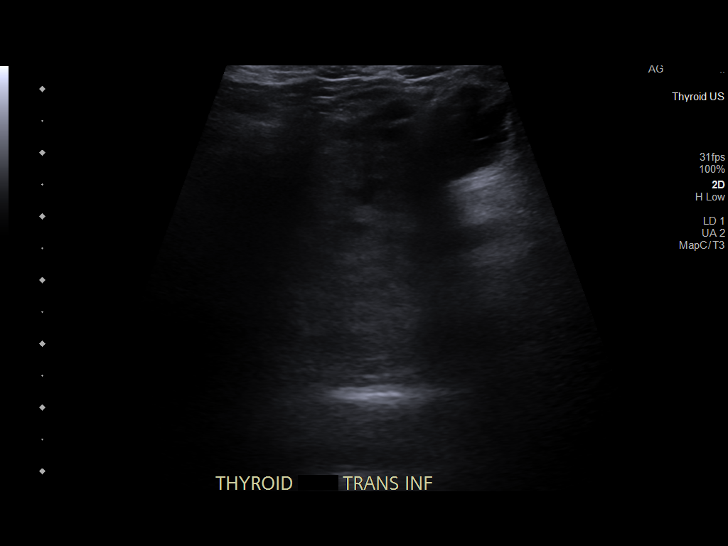
[im 70/93]
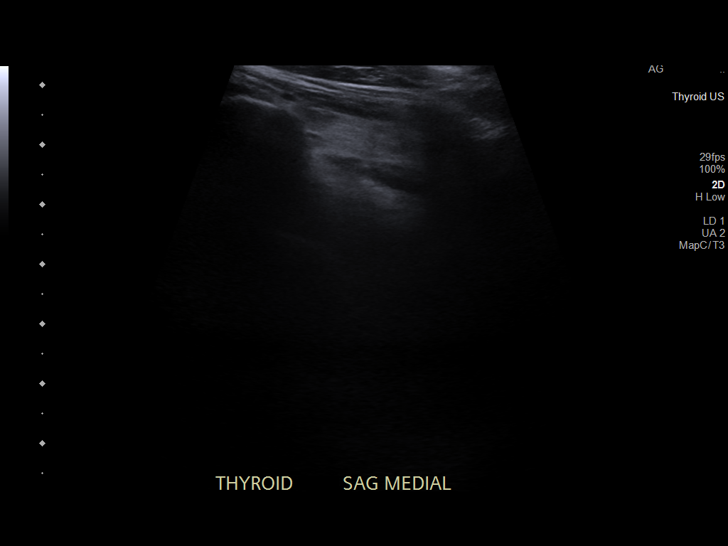
[im 77/93]
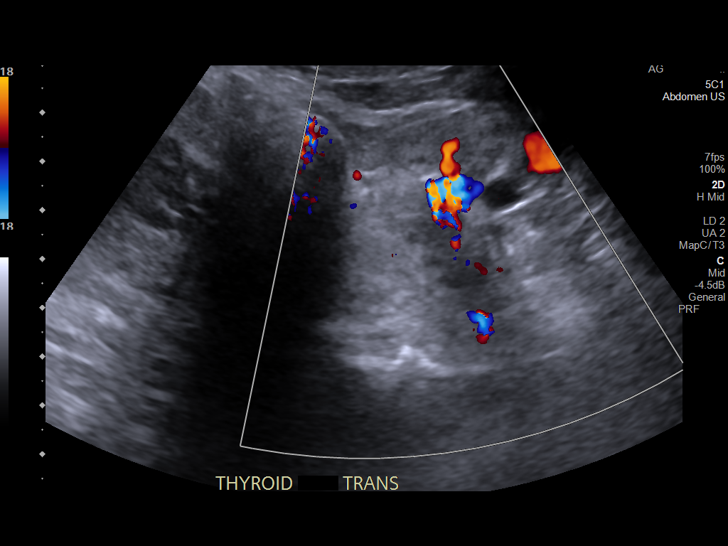
[im 85/93]
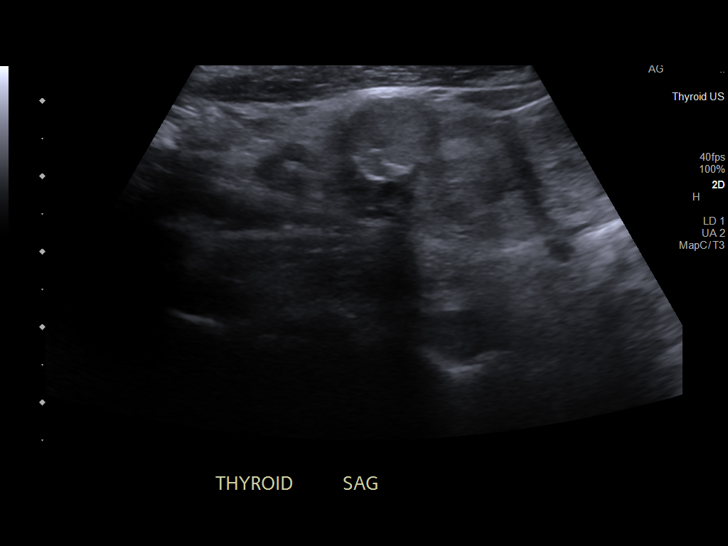
[im 93/93]
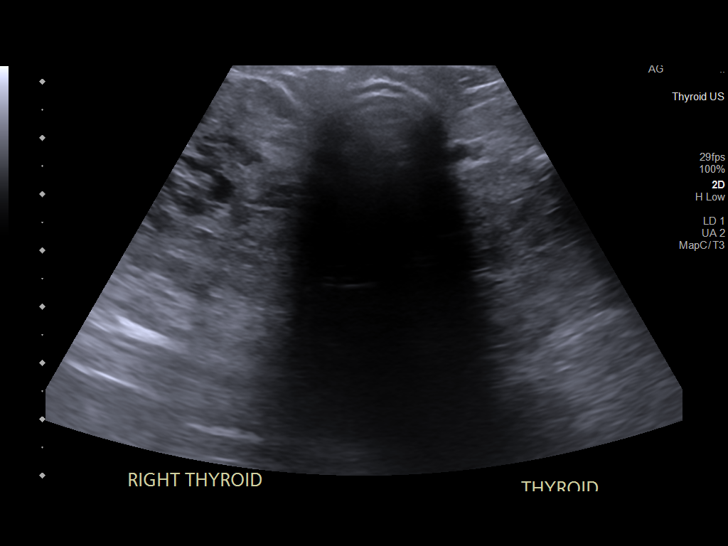

[13 of 25 positions shown; findings below may reference images not displayed]

FINDINGS: Parenchymal Echotexture: Moderately heterogenous

Isthmus: Normal in size measures 0.5 cm in diameter

Right lobe: Enlarged measuring 13.8 x 6.7 x 6.4 cm

Left lobe: Enlarged measuring 9.9 x 4.9 x 3.5 cm

_________________________________________________________

Estimated total number of nodules >/= 1 cm: 3

Number of spongiform nodules >/=  2 cm not described below (TR1): 0

Number of mixed cystic and solid nodules >/= 1.5 cm not described
below (TR2): 0

_________________________________________________________

Nodule # 1:

Location: Right; Mid - this nodule likely correlates with the
dominant hypoattenuating nodule/mass seen on preceding chest CT

Maximum size: 5.3 cm; Other 2 dimensions: 4.9 x 3.8 cm

Composition: solid/almost completely solid (2)

Echogenicity: isoechoic (1)

Shape: not taller-than-wide (0)

Margins: ill-defined (0)

Echogenic foci: none (0)

ACR TI-RADS total points: 3.

ACR TI-RADS risk category: TR3 (3 points).

ACR TI-RADS recommendations:

**Given size (>/= 2.5 cm) and appearance, fine needle aspiration of
this mildly suspicious nodule should be considered based on TI-RADS
criteria.

_________________________________________________________

Questioned 4.8 x 3.3 x 4.2 cm isoechoic ill-defined nodule/mass
within the inferior pole of the left lobe of the thyroid (labeled 2)
is favored to represent a pseudonodule as it lacks defined borders
on both provided transverse and sagittal images.

_________________________________________________________

Nodule # 3:

Location: Left; Superior

Maximum size: 1.5 cm; Other 2 dimensions: 1.4 x 1.2 cm

Composition: solid/almost completely solid (2)

Echogenicity: isoechoic (1)

Shape: not taller-than-wide (0)

Margins: ill-defined (0)

Echogenic foci: peripheral calcifications (2)

Additional echogenic foci 1:  macrocalcifications (1)

ACR TI-RADS total points: 6.

ACR TI-RADS risk category: TR4 (4-6 points).

ACR TI-RADS recommendations:

**Given size (>/= 1.5 cm) and appearance, fine needle aspiration of
this moderately suspicious nodule should be considered based on
TI-RADS criteria.

_________________________________________________________
IMPRESSION: 1. Thyromegaly with findings suggestive of multinodular goiter.
2. Nodules labeled #1 and #3 both meet imaging criteria to recommend
percutaneous sampling as indicated.

The above is in keeping with the ACR TI-RADS recommendations - [HOSPITAL] 7815;[DATE].

## 2021-08-01 NOTE — Patient Instructions (Signed)
[facility] at [facility] Discharge Instructions  You were seen today by Dr. Katragadda. He went over your recent results. Dr. Katragadda will see you back in 3 months for labs and follow up.   Thank you for choosing [facility] at [facility] to provide your oncology and hematology care.  To afford each patient quality time with our provider, please arrive at least 15 minutes before your scheduled appointment time.   If you have a lab appointment with the Cancer Center please come in thru the Main Entrance and check in at the main information desk  You need to re-schedule your appointment should you arrive 10 or more minutes late.  We strive to give you quality time with our providers, and arriving late affects you and other patients whose appointments are after yours.  Also, if you no show three or more times for appointments you may be dismissed from the clinic at the providers discretion.     Again, thank you for choosing [facility].  Our hope is that these requests will decrease the amount of time that you wait before being seen by our physicians.       _____________________________________________________________  Should you have questions after your visit to  Cancer Center, please contact our office at [phone] between the hours of 8:00 a.m. and 4:30 p.m.  Voicemails left after 4:00 p.m. will not be returned until the following business day.  For prescription refill requests, have your pharmacy contact our office and allow 72 hours.    Cancer Center Support Programs:   > Cancer Support Group  2nd Tuesday of the month 1pm-2pm, Journey Room   

## 2021-08-02 ENCOUNTER — Other Ambulatory Visit (HOSPITAL_COMMUNITY): Payer: Self-pay

## 2021-08-02 DIAGNOSIS — C61 Malignant neoplasm of prostate: Secondary | ICD-10-CM

## 2021-08-02 DIAGNOSIS — C180 Malignant neoplasm of cecum: Secondary | ICD-10-CM

## 2021-08-02 NOTE — Progress Notes (Signed)
Orders placed per Dr. Tomie China order.

## 2021-08-07 ENCOUNTER — Other Ambulatory Visit: Payer: Self-pay

## 2021-08-07 ENCOUNTER — Ambulatory Visit (INDEPENDENT_AMBULATORY_CARE_PROVIDER_SITE_OTHER): Payer: Medicare Other | Admitting: "Endocrinology

## 2021-08-07 ENCOUNTER — Encounter: Payer: Self-pay | Admitting: "Endocrinology

## 2021-08-07 VITALS — BP 118/78 | HR 68 | Ht 70.5 in | Wt 202.0 lb

## 2021-08-07 DIAGNOSIS — E890 Postprocedural hypothyroidism: Secondary | ICD-10-CM | POA: Diagnosis not present

## 2021-08-07 MED ORDER — LEVOTHYROXINE SODIUM 100 MCG PO TABS: 100.0000 ug | ORAL_TABLET | Freq: Every day | ORAL | 1 refills | Status: DC

## 2021-08-07 NOTE — Progress Notes (Signed)
08/07/2021, 12:39 PM         Endocrinology follow-up note   Subjective:    Patient ID: Jerry Macdonald, male    DOB: 1931-08-16, PCP [name], MD   Past Medical History:  Diagnosis Date   Abnormal prostate biopsy    Back pain    with radiculopathy   Cataracts, bilateral    Constipation    Family history of lung cancer    Family history of ovarian cancer    Family history of prostate cancer    Goiter    HOH (hard of hearing)    Hyperlipidemia    Hypertrophy of prostate    Malaise and fatigue    Obesity    Pyogenic granuloma    left cheeck   S/P colonoscopy June 2011   rectal bleeding secondary to hemorrhoids   S/P colonoscopy 2007   [facility], diverticulosis per Dr. Juel Burrow note   S/P endoscopy June 2011   Barrett's, mild gastritis and duodenitis   Seborrheic keratosis    Sleep apnea    Past Surgical History:  Procedure Laterality Date   BIOPSY  03/27/2019   Procedure: BIOPSY;  Surgeon: Danie Binder, MD;  [facility];  Service: Endoscopy;;   BIOPSY  08/16/2020   Procedure: BIOPSY;  Surgeon: Eloise Harman, DO;  [facility]: [facility];  Service: Endoscopy;;   COLONOSCOPY  JUN 2011   PAN-COLONIC DIVERTICULOSIS   [place]ON[place]  2007   [facility]   COLONOSCOPY WITH PROPOFOL N/A 08/16/2020   Procedure: COLONOSCOPY WITH PROPOFOL;  Surgeon: Eloise Harman, DO;  [facility];  Service: Endoscopy;  Laterality: N/A;  10:45am   ESOPHAGOGASTRODUODENOSCOPY  06/11/2011   Procedure: ESOPHAGOGASTRODUODENOSCOPY (EGD);  Surgeon: Dorothyann Peng, MD;  [facility];  Service: Endoscopy;  Laterality: N/A;  11:00   ESOPHAGOGASTRODUODENOSCOPY N/A 05/21/2014   Dr. Fields:Barrett's esophagus/ Medium sized hiatal hernia. path with indefinite dysplasia.    ESOPHAGOGASTRODUODENOSCOPY N/A 12/20/2014   Dr. Clayburn Pert gastric polyp/large HH/6 cm segment of Barrett's esophagus, path with  hyperplastic gastric polyp, no dysplasia. Surveillance April 2017 due    ESOPHAGOGASTRODUODENOSCOPY N/A 01/20/2016   Procedure: ESOPHAGOGASTRODUODENOSCOPY (EGD);  Surgeon: Danie Binder, MD;  [facility];  Service: Endoscopy;  Laterality: N/A;  1230pm   ESOPHAGOGASTRODUODENOSCOPY N/A 03/27/2019   Procedure: ESOPHAGOGASTRODUODENOSCOPY (EGD);  Surgeon: Danie Binder, MD;  [facility];  Service: Endoscopy;  Laterality: N/A;  12:15pm   HERNIA REPAIR     LAPAROSCOPIC PARTIAL COLECTOMY Right 09/28/2020   Procedure: LAPAROSCOPIC RIGHT HEMI COLECTOMY;  Surgeon: Virl Cagey, MD;  Location: [facility];  Service: General;  Laterality: Right;   THYROIDECTOMY N/A 01/23/2021   Procedure: TOTAL THYROIDECTOMY;  Surgeon: Virl Cagey, MD;  Location: [facility];  Service: General;  Laterality: N/A;   UMBILICAL HERNIA REPAIR     VARICOSE VEIN SURGERY     Social History   Socioeconomic History   Marital status: Divorced    Spouse name: Not on file   Number of children: Not on file   Years of education: Not on file   Highest education level: Not on file  Occupational History   Not on  file  Tobacco Use   Smoking status: Former    Packs/day: 0.50    Years: 2.00    Pack years: 1.00    Types: Cigarettes    Quit date: 09/10/1963    Years since quitting: 57.9   Smokeless tobacco: Never   Tobacco comments:    quit in 1965  Vaping Use   Vaping Use: Former  Substance and Sexual Activity   Alcohol use: Not Currently   Drug use: No   Sexual activity: Not Currently  Other Topics Concern   Not on file  Social History Narrative   Not on file   Social Determinants of Health   Financial Resource Strain: Not on file  Food Insecurity: Not on file  Transportation Needs: No Transportation Needs   Lack of Transportation (Medical): No   Lack of Transportation (Non-Medical): No  Physical Activity: Inactive   Days of Exercise per Week: 0 days   Minutes of Exercise per Session: 0  min  Stress: Not on file  Social Connections: Not on file   Family History  Problem Relation Age of Onset   Prostate cancer Father 79       metastatic   Lung cancer Brother 77       heavy smoker   Prostate cancer Paternal Uncle        metastatic   Alzheimer's disease Maternal Grandmother    Prostate cancer Maternal Grandfather 82       metastatic   Prostate cancer Paternal Grandfather 74       metastatic   Ovarian cancer Paternal Aunt 7   Prostate cancer Paternal Uncle        metastatic   Cancer Cousin 66       unknown type, paternal first cousin   Liver cancer Cousin        dx 48s, paternal first cousin   Colon polyps Neg Hx    Colon cancer Neg Hx    Outpatient Encounter Medications as of 08/07/2021  Medication Sig   calcium-vitamin D (OSCAL WITH D) 500-5 MG-MCG tablet Take 1 tablet by mouth in the morning and at bedtime. Lunch and dinner   acetaminophen (TYLENOL) 500 MG tablet Take 500 mg by mouth every 8 (eight) hours as needed for moderate pain. (Patient not taking: Reported on 08/01/2021)   Cholecalciferol (VITAMIN D3) 25 MCG (1000 UT) CAPS Take 1 capsule by mouth 3 (three) times daily.   ferrous sulfate 324 MG TBEC Take 324 mg by mouth every other day.   finasteride (PROSCAR) 5 MG tablet Take 1 tablet (5 mg total) by mouth daily.   levothyroxine (SYNTHROID) 100 MCG tablet Take 1 tablet (100 mcg total) by mouth daily before breakfast.   magnesium oxide (MAG-OX) 400 MG tablet Take 400 mg by mouth 2 (two) times daily.   Multiple Vitamins-Minerals (MULTIVITAMINS THER. W/MINERALS) TABS Take 1 tablet by mouth daily.   ondansetron (ZOFRAN-ODT) 4 MG disintegrating tablet Take 1 tablet (4 mg total) by mouth every 6 (six) hours as needed for nausea. (Patient not taking: Reported on 08/01/2021)   pantoprazole (PROTONIX) 40 MG tablet Take one tablet once daily before a meal, can take second tablet before evening if needed.   tamsulosin (FLOMAX) 0.4 MG CAPS capsule Take 1 capsule (0.4  mg total) by mouth daily.   traMADol (ULTRAM) 50 MG tablet Take 1 tablet (50 mg total) by mouth every 6 (six) hours as needed for moderate pain or severe pain. (Patient not taking: Reported on 08/01/2021)   [  DISCONTINUED] calcium-vitamin D (OSCAL WITH D) 500-200 MG-UNIT tablet Take 1 tablet by mouth 3 (three) times daily.   [DISCONTINUED] levothyroxine (SYNTHROID) 75 MCG tablet TAKE 1 TABLET DAILY AT 6 A.M.   No facility-administered encounter medications on file as of 08/07/2021.   ALLERGIES: Allergies  Allergen Reactions   Penicillins     Swelling of hands and feet - severe Did it involve swelling of the face/tongue/throat, SOB, or low BP? No Did it involve sudden or severe rash/hives, skin peeling, or any reaction on the inside of your mouth or nose? No Did you need to seek medical attention at a hospital or doctor's office? No When did it last happen?      1950s If all above answers are "NO", may proceed with cephalosporin use.    Penicillin G Other (See Comments)    VACCINATION STATUS: Immunization History  Administered Date(s) Administered   DTaP 06/03/2013   H1N1 08/12/2008   Influenza Whole 07/07/2007, 05/20/2008   Influenza-Unspecified 06/03/2013, 06/03/2018   Pneumococcal-Unspecified 06/03/2013, 06/03/2014   Varicella 06/03/2013    HPI Jerry Macdonald is 87 y.o. male who is seen in follow-up for history of compressive multinodular goiter.  He underwent total thyroidectomy on Jan 23, 2021 by Dr. Blake Divine.   His surgical sample is negative for malignancy.    - He reports feeling better, previous compressive symptoms have resolved.  He was initiated on levothyroxine 75 mcg p.o. daily before breakfast.    -He did not have recent labs for thyroid function.   He is accompanied by  Kathee Polite his friend and power of attorney during this telephone visit.  PMD:  [name], MD.   He is not an optimal historian.  History is obtained mainly from chart review. he has  been dealing with symptoms of fatigue, positional shortness of breath, sleep apnea for several years. He reports that he breathes better, swallows without difficulty. Marland Kitchen  He is a former smoker, denies any exposure to neck radiations. His other medical problems include BPH/prostate cancer, Barrett's esophagus, sleep apnea, and recently diagnosed cecal/colon cancer. He is cared for by an elderly friend Dola Argyle is also his power of attorney.   Review of Systems  Limited as above.  Objective:    Vitals with BMI 08/07/2021 08/01/2021 07/13/2021  Height 5' 10.5" - 5' 10.5"  Weight 202 lbs 199 lbs 8 oz 200 lbs  BMI 28.56 02.40 97.35  Systolic 329 924 268  Diastolic 78 70 96  Pulse 68 76 69    BP 118/78   Pulse 68   Ht 5' 10.5" (1.791 m)   Wt 202 lb (91.6 kg)   BMI 28.57 kg/m   Wt Readings from Last 3 Encounters:  08/07/21 202 lb (91.6 kg)  08/01/21 199 lb 8 oz (90.5 kg)  07/13/21 200 lb (90.7 kg)    Physical Exam    CMP ( most recent) CMP     Component Value Date/Time   NA 137 07/21/2021 0950   NA 141 02/02/2021 1552   K 4.4 07/21/2021 0950   CL 100 07/21/2021 0950   CO2 29 07/21/2021 0950   GLUCOSE 102 (H) 07/21/2021 0950   BUN 21 07/21/2021 0950   BUN 17 02/02/2021 1552   CREATININE 0.80 07/21/2021 0950   CALCIUM 9.2 07/21/2021 0950   CALCIUM 8.6 01/23/2021 1552   PROT 7.2 07/21/2021 0950   ALBUMIN 4.3 07/21/2021 0950   AST 26 07/21/2021 0950   ALT 21 07/21/2021 0950  ALKPHOS 61 07/21/2021 0950   BILITOT 1.9 (H) 07/21/2021 0950   GFRNONAA >60 07/21/2021 0950   GFRAA  02/03/2010 1249    >60        The eGFR has been calculated using the MDRD equation. This calculation has not been validated in all clinical situations. eGFR's persistently <60 mL/min signify possible Chronic Kidney Disease.     Lipid Panel ( most recent) Lipid Panel     Component Value Date/Time   CHOL 139 08/12/2008 2244   TRIG 88 08/12/2008 2244   HDL 39 (L) 08/12/2008  2244   CHOLHDL 3.6 Ratio 08/12/2008 2244   VLDL 18 08/12/2008 2244   LDLCALC 82 08/12/2008 2244      Lab Results  Component Value Date   TSH 7.190 (H) 07/28/2021   TSH 0.727 02/02/2021   TSH 0.103 (L) 01/19/2021   TSH 0.198 (L) 10/21/2020   TSH 0.075 (L) 09/06/2020   TSH 0.772 08/09/2007   FREET4 1.12 07/28/2021   FREET4 1.02 02/02/2021   FREET4 1.06 10/21/2020    September 14, 2020 thyroid ultrasound: Right lobe measures 13.8 x 6.7 x 6.4 cm with large 5.3 cm nodule which was biopsied and findings were benign. Left lobe measured 9.9 x 4.9 x 3.5 cm with 2 nodules measuring 4.8 cm Pseudonodule not biopsied and 1.5 cm-suspicious biopsied.  Fine-needle aspiration biopsy results of only thyroid nodule is available today showing benign follicular nodule.  Total thyroidectomy Jan 23, 2021. Surgical sample was significant for nodular hyperplasia bilaterally.  Assessment & Plan:   1.  Postsurgical hypothyroidism 2. Multinodular goiter 3.  Hypocalcemia  His compressive symptoms have resolved.  I discussed his surgical samples/findings with him.  His previsit thyroid function tests are consistent with inadequate replacement.   -I discussed and increase his levothyroxine to 100 mcg p.o. daily before breakfast.  - We discussed about the correct intake of his thyroid hormone, on empty stomach at fasting, with water, separated by at least 30 minutes from breakfast and other medications,  and separated by more than 4 hours from calcium, iron, multivitamins, acid reflux medications (PPIs). -Patient is made aware of the fact that thyroid hormone replacement is needed for life, dose to be adjusted by periodic monitoring of thyroid function tests.   For his postsurgical mild hypocalcemia, he is advised to continue calcium/vitamin D 500- 200 1 tablet 3 times daily with lunch and supper.     - he is advised to maintain close follow up with [name], MD for primary care needs.    I spent 25  minutes in the care of the patient today including review of labs from Thyroid Function, CMP, and other relevant labs ; imaging/biopsy records (current and previous including abstractions from other facilities); face-to-face time discussing  his lab results and symptoms, medications doses, his options of short and long term treatment based on the latest standards of care / guidelines;   and documenting the encounter.  Jerry Macdonald  participated in the discussions, expressed understanding, and voiced agreement with the above plans.  All questions were answered to his satisfaction. he is encouraged to contact clinic should he have any questions or concerns prior to his return visit.    Follow up plan: Return in about 4 months (around 12/06/2021) for F/U with Pre-visit Labs.   Glade Lloyd, MD [facility] [address] [place], [place] [address] Phone: [phone]  Fax: [phone]     08/07/2021, 12:39 PM  This note was partially dictated with voice recognition software. Similar sounding words can be transcribed inadequately or may not  be corrected upon review.

## 2021-08-08 IMAGING — US US FNA BIOPSY THYROID 1ST LESION
1 series · 13 of 23 positions shown · non-contrast
Comparison: Thyroid ultrasound 09/14/2020

MEDICATIONS:
None

COMPLICATIONS:
None immediate

INDICATION: Indeterminate thyroid nodule

EXAM:
ULTRASOUND GUIDED FINE NEEDLE ASPIRATION OF INDETERMINATE THYROID
NODULE
ULTRASOUND GUIDED FINE NEEDLE ASPIRATION OF ADDITIONAL INDETERMINATE
THYROID NODULE
TECHNIQUE: Informed written consent was obtained from the patient after a
discussion of the risks, benefits and alternatives to treatment.
Questions regarding the procedure were encouraged and answered. A
timeout was performed prior to the initiation of the procedure.

[Series 1: us fna bx thyroid 1st lesion afirma · 23 acquisitions, 13 frames shown]
[im 1/23]
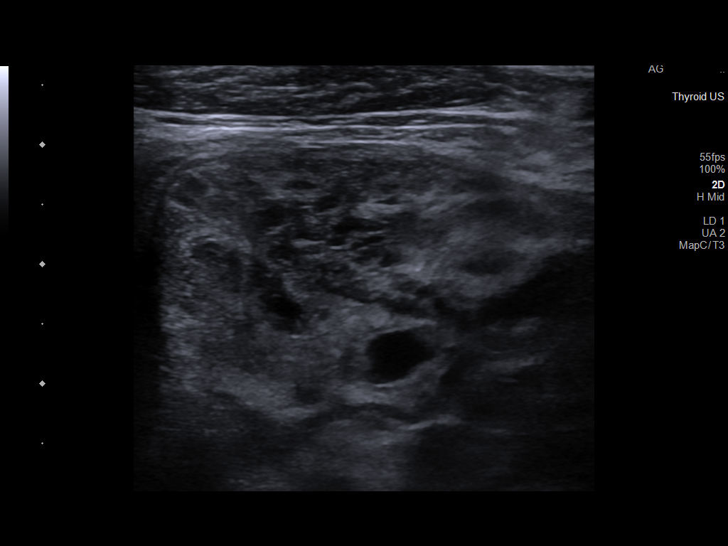
[im 3/23]
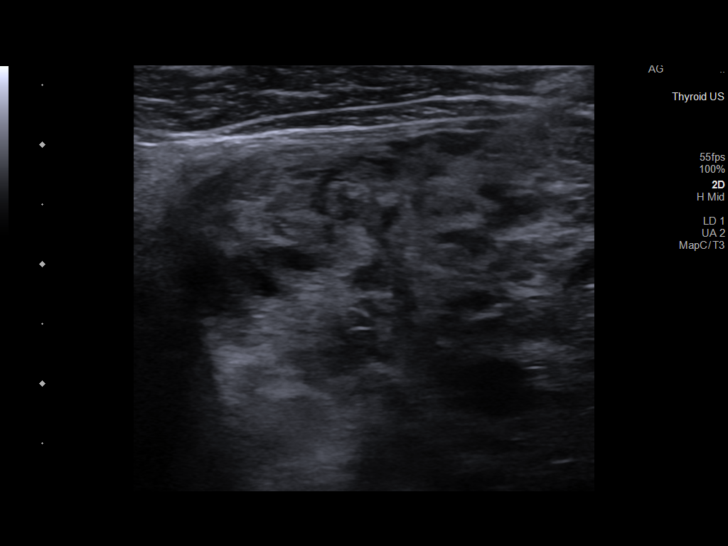
[im 5/23]
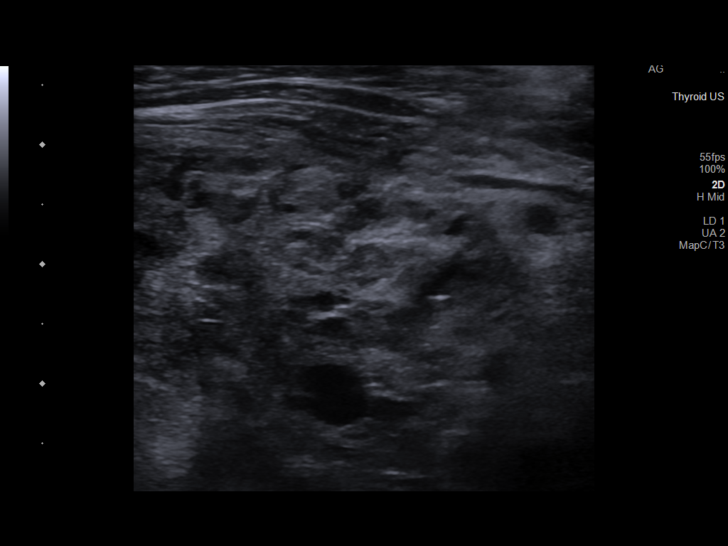
[im 7/23]
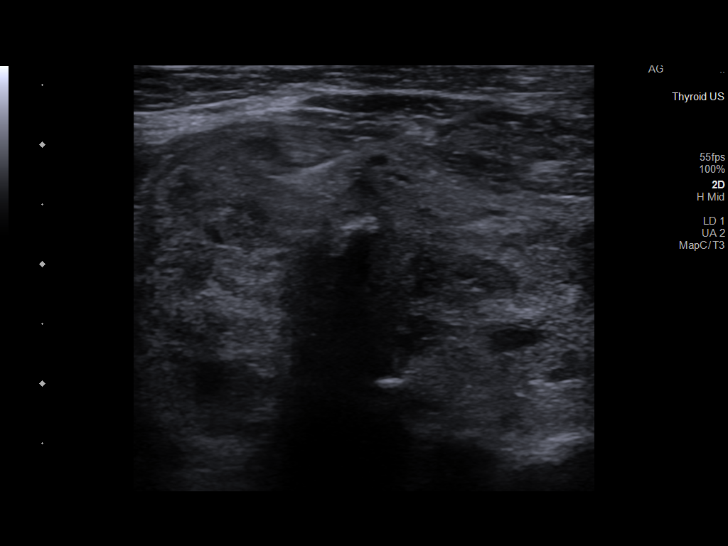
[im 8/23]
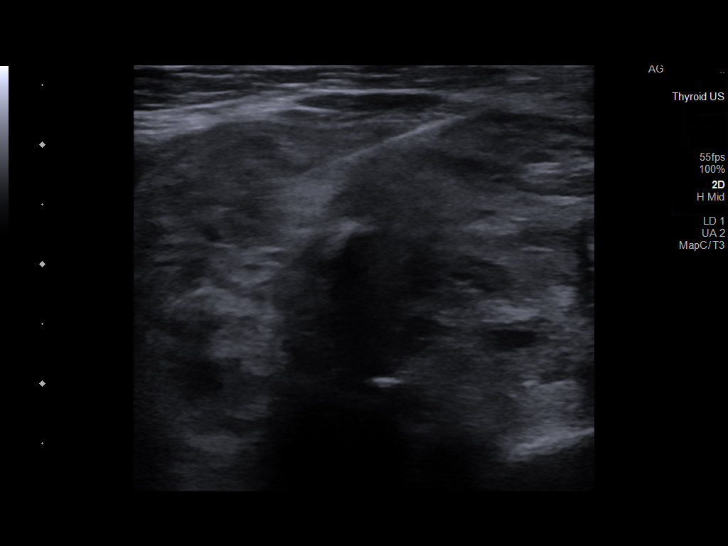
[im 10/23]
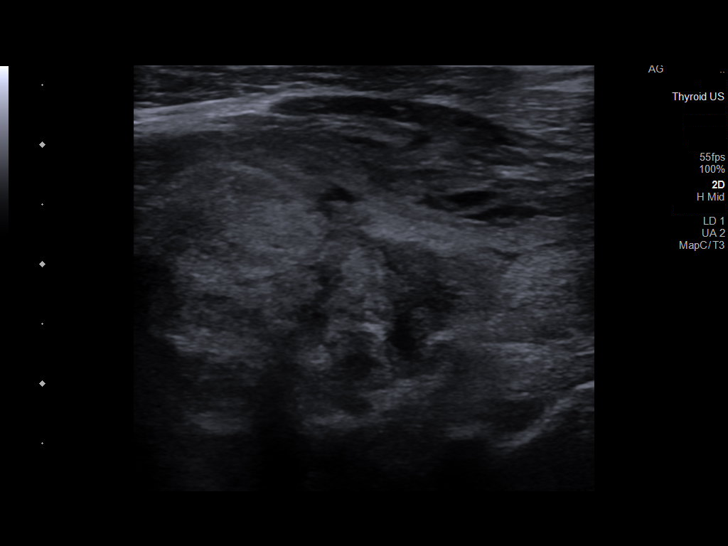
[im 12/23]
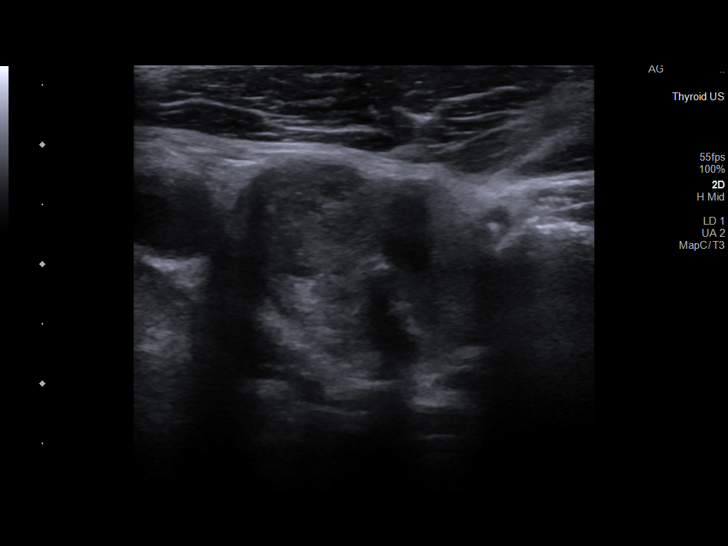
[im 14/23]
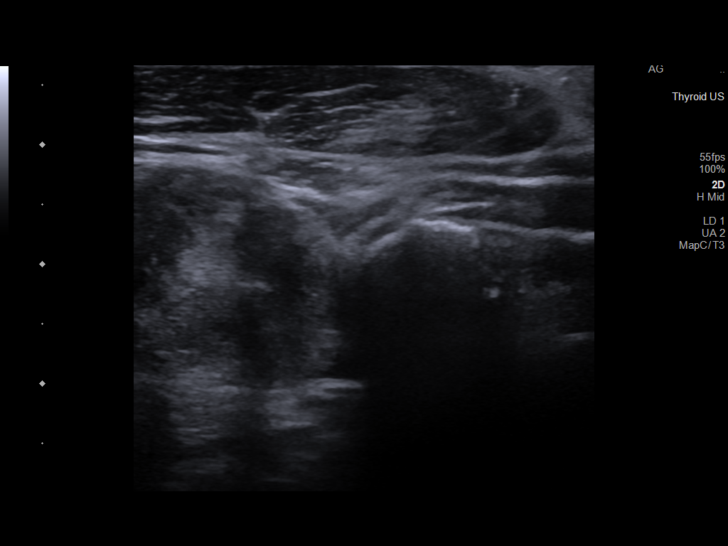
[im 16/23]
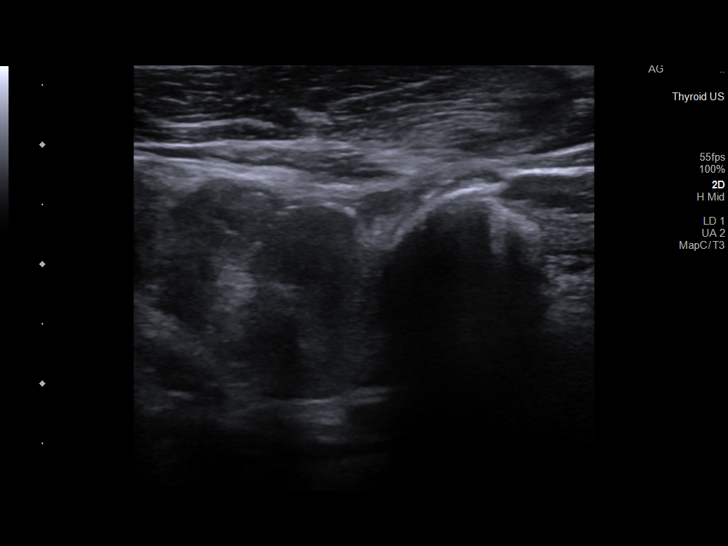
[im 17/23]
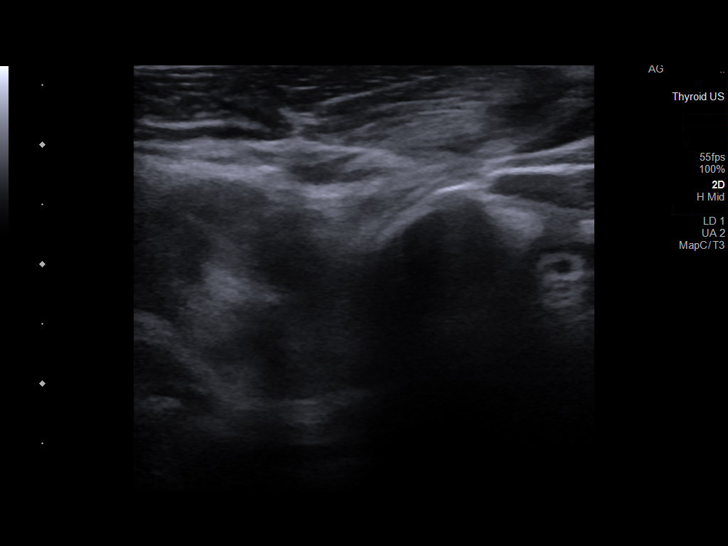
[im 19/23]
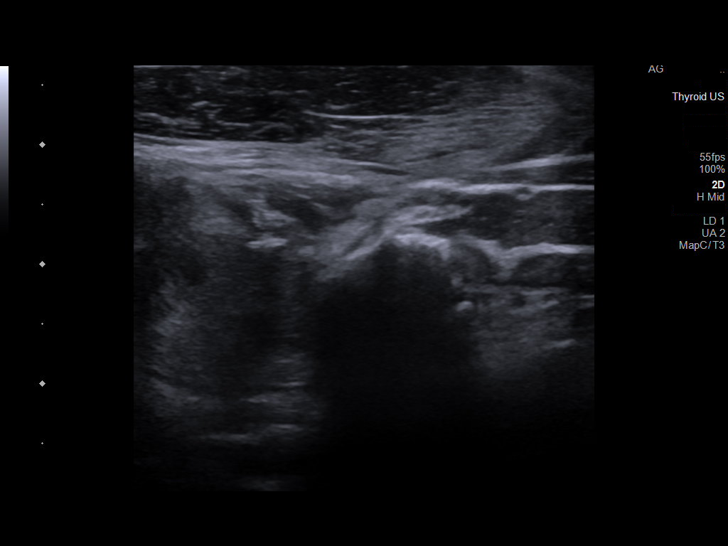
[im 21/23]
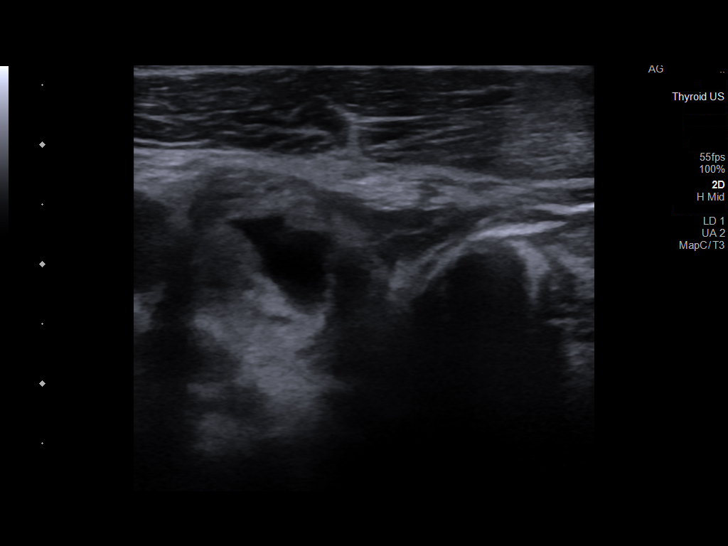
[im 23/23]
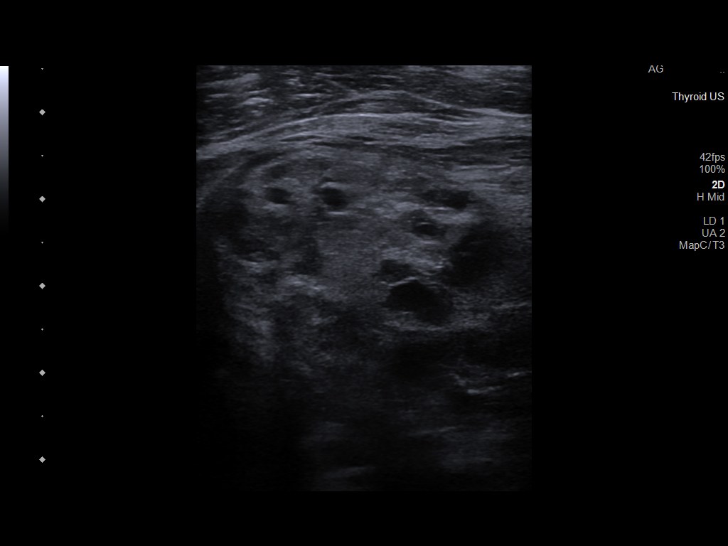

[13 of 23 positions shown; findings below may reference images not displayed]

Pre-procedural ultrasound scanning demonstrated unchanged size and
appearance of the indeterminate nodules within the thyroid lobes
bilaterally

The procedure was planned. The neck was prepped in the usual sterile
fashion, and a sterile drape was applied covering the operative
field. A timeout was performed prior to the initiation of the
procedure. Local anesthesia was provided with 1% lidocaine.

Under direct ultrasound guidance, 3 FNA biopsies were performed of
the 5.3 cm diameter RIGHT mid thyroid nodule with a 25 gauge needle.
Multiple ultrasound images were saved for procedural documentation
purposes. The samples were prepared and submitted to pathology. Two
additional FNA samples were obtained for Afirma testing, if
required.

Under direct ultrasound guidance, 3 FNA biopsies were performed of
the 1.5 cm diameter superior LEFT with a 25 gauge needle. Multiple
ultrasound images were saved for procedural documentation purposes.
The samples were prepared and submitted to pathology. Two additional
FNA samples were obtained for Afirma testing, if required.

Limited post procedural scanning was negative for hematoma or
additional complication. Dressings were placed. The patient
tolerated the above procedures procedure well without immediate
postprocedural complication.
FINDINGS: Nodule reference number based on prior diagnostic ultrasound: 1

Maximum size:

Location: Right; Mid

ACR TI-RADS risk category: TR3 (3 points)

Reason for biopsy: meets ACR TI-RADS criteria

_________________________________________________________

Nodule reference number based on prior diagnostic ultrasound: 3

Maximum size: 1.5 cm

Location: Left; Superior

ACR TI-RADS risk category: TR4 (4-6 points)

Reason for biopsy: meets ACR TI-RADS criteria

Ultrasound imaging confirms appropriate placement of the needles
within the thyroid nodule.
IMPRESSION: 1. Technically successful ultrasound guided fine needle aspiration
of RIGHT mid thyroid nodule.
2. Technically successful ultrasound guided fine needle aspiration
of LEFT superior thyroid nodule.

## 2021-09-07 DIAGNOSIS — D649 Anemia, unspecified: Secondary | ICD-10-CM | POA: Diagnosis not present

## 2021-09-07 DIAGNOSIS — E039 Hypothyroidism, unspecified: Secondary | ICD-10-CM | POA: Diagnosis not present

## 2021-09-07 DIAGNOSIS — R7301 Impaired fasting glucose: Secondary | ICD-10-CM | POA: Diagnosis not present

## 2021-09-11 DIAGNOSIS — R7301 Impaired fasting glucose: Secondary | ICD-10-CM | POA: Diagnosis not present

## 2021-09-11 DIAGNOSIS — E039 Hypothyroidism, unspecified: Secondary | ICD-10-CM | POA: Diagnosis not present

## 2021-09-11 DIAGNOSIS — D649 Anemia, unspecified: Secondary | ICD-10-CM | POA: Diagnosis not present

## 2021-09-11 DIAGNOSIS — C61 Malignant neoplasm of prostate: Secondary | ICD-10-CM | POA: Diagnosis not present

## 2021-10-10 ENCOUNTER — Other Ambulatory Visit: Payer: Self-pay | Admitting: Urology

## 2021-10-16 ENCOUNTER — Other Ambulatory Visit: Payer: Self-pay

## 2021-10-16 ENCOUNTER — Other Ambulatory Visit: Payer: Medicare Other

## 2021-10-16 DIAGNOSIS — R972 Elevated prostate specific antigen [PSA]: Secondary | ICD-10-CM | POA: Diagnosis not present

## 2021-10-17 LAB — PSA: Prostate Specific Ag, Serum: 5.8 ng/mL — ABNORMAL HIGH (ref 0.0–4.0)

## 2021-10-19 ENCOUNTER — Ambulatory Visit: Payer: Medicare Other | Admitting: Urology

## 2021-10-26 ENCOUNTER — Ambulatory Visit (INDEPENDENT_AMBULATORY_CARE_PROVIDER_SITE_OTHER): Payer: Medicare Other | Admitting: Urology

## 2021-10-26 ENCOUNTER — Other Ambulatory Visit: Payer: Self-pay

## 2021-10-26 VITALS — BP 126/76 | HR 69 | Ht 70.0 in | Wt 190.0 lb

## 2021-10-26 DIAGNOSIS — N401 Enlarged prostate with lower urinary tract symptoms: Secondary | ICD-10-CM | POA: Diagnosis not present

## 2021-10-26 DIAGNOSIS — R972 Elevated prostate specific antigen [PSA]: Secondary | ICD-10-CM | POA: Diagnosis not present

## 2021-10-26 DIAGNOSIS — R351 Nocturia: Secondary | ICD-10-CM

## 2021-10-26 DIAGNOSIS — C61 Malignant neoplasm of prostate: Secondary | ICD-10-CM

## 2021-10-26 DIAGNOSIS — N138 Other obstructive and reflux uropathy: Secondary | ICD-10-CM

## 2021-10-26 LAB — MICROSCOPIC EXAMINATION: Renal Epithel, UA: NONE SEEN /HPF

## 2021-10-26 LAB — URINALYSIS, ROUTINE W REFLEX MICROSCOPIC
Bilirubin, UA: NEGATIVE
Glucose, UA: NEGATIVE
Ketones, UA: NEGATIVE
Nitrite, UA: NEGATIVE
Protein,UA: NEGATIVE
Specific Gravity, UA: 1.02 (ref 1.005–1.030)
Urobilinogen, Ur: 0.2 mg/dL (ref 0.2–1.0)
pH, UA: 6.5 (ref 5.0–7.5)

## 2021-10-26 MED ORDER — TAMSULOSIN HCL 0.4 MG PO CAPS: 0.4000 mg | ORAL_CAPSULE | Freq: Every day | ORAL | 3 refills | Status: DC

## 2021-10-26 MED ORDER — FINASTERIDE 5 MG PO TABS: 5.0000 mg | ORAL_TABLET | Freq: Every day | ORAL | 3 refills | Status: DC

## 2021-10-26 NOTE — Progress Notes (Signed)
Subjective:  1. Prostate cancer ([place])   2. Elevated prostate specific antigen (PSA)   3. BPH with urinary obstruction   4. Nocturia      Jerry Macdonald is a 88 year-old male established patient who is here evaluation for treatment of prostate cancer.  10/26/21: Jerry Macdonald returns today in f/u.  His PSA is 5.8 which is up from 5.2 in 11/22 and 4.9 8/22.   He remains on finasteride and tamsulosin.  He has moderate LUTS with an IPSS of 17 with nocturia 2-3x.   He is content with the symptoms.   He has no weight loss.   He has some back pain but no bone pain.  A CT in November 2022 showed no findings to suggests a met.   His prostate cancer was diagnosed 10/04/2014. His PSA at his time of diagnosis was 6. His  PSA has been variable with and 4.4 in 5/22 and 4.9 on 04/06/21.  It was 3.5 in 7/21, 4.08 on 09/06/20 and 5.8 on 10/06/20 but he had a colon resection and a foley prior to that blood draw.  He had colon cancer with negative margins.  He had a thyroidectomy for goiter on 01/23/21.   He remains on finasteride and tamsulosin.  His IPSS is 12 with nocturia x 2.  His UA is clear.    03/11/20: Jerry Macdonald returns today in f/u. He is AS from low volume Gleason 7(3+4) disease found on surveillance biopsy in 8/17. He had a single core Gleason 6 on his initial biopsy in 2016. He has been seen by Dr. Tammi Klippel and he felt continued AS was reasonable. He remains on finasteride and his PSA is back down to 3.5 from 3.8 at his last visit. He remains on tamsulosin. He is voiding well. He has nocturia x 2. He has a good stream without hesitancy. He has less urgency except in the morning. He does have a sensation of incomplete emptying and he can have a weak stream. His IPSS is 9. He has no associated signs or symptoms.   He was originally diagnosed in 2/16 with a single core of Gleason 6, 10% on the left mid lateral core. His PSA has risen very slowly and was 6.52 prior to a repeat biopsy in 8/17. The repeat biopsy showed a  133m prostate and there were 3 cores positive with 5% Gleason 6 in the left lateral base, 10% Gleason 7(3+4) in the left lateral apex and 20% Gleason 6 in the right medial apex.      IPSS     Row Name 10/26/21 1600         International Prostate Symptom Score   How often have you had the sensation of not emptying your bladder? More than half the time     How often have you had to urinate less than every two hours? About half the time     How often have you found you stopped and started again several times when you urinated? About half the time     How often have you found it difficult to postpone urination? Less than half the time     How often have you had a weak urinary stream? About half the time     How often have you had to strain to start urination? Not at All     How many times did you typically get up at night to urinate? 2 Times     Total IPSS Score 17  Quality of Life due to urinary symptoms   If you were to spend the rest of your life with your urinary condition just the way it is now how would you feel about that? Mostly Satisfied                    ROS:  ROS:  A complete review of systems was performed.  All systems are negative except for pertinent findings as noted.   Review of Systems  Musculoskeletal:  Positive for back pain.   Allergies  Allergen Reactions   Penicillins     Swelling of hands and feet - severe Did it involve swelling of the face/tongue/throat, SOB, or low BP? No Did it involve sudden or severe rash/hives, skin peeling, or any reaction on the inside of your mouth or nose? No Did you need to seek medical attention at a hospital or doctor's office? No When did it last happen?      1950s If all above answers are NO, may proceed with cephalosporin use.    Penicillin G Other (See Comments)    Outpatient Encounter Medications as of 10/26/2021  Medication Sig   acetaminophen (TYLENOL) 500 MG tablet Take 500 mg by mouth every 8  (eight) hours as needed for moderate pain.   calcium-vitamin D (OSCAL WITH D) 500-5 MG-MCG tablet Take 1 tablet by mouth in the morning and at bedtime. Lunch and dinner   Cholecalciferol (VITAMIN D3) 25 MCG (1000 UT) CAPS Take 1 capsule by mouth 3 (three) times daily.   ferrous sulfate 324 MG TBEC Take 324 mg by mouth every other day.   levothyroxine (SYNTHROID) 100 MCG tablet Take 1 tablet (100 mcg total) by mouth daily before breakfast.   magnesium oxide (MAG-OX) 400 MG tablet Take 400 mg by mouth 2 (two) times daily.   Multiple Vitamins-Minerals (MULTIVITAMINS THER. W/MINERALS) TABS Take 1 tablet by mouth daily.   ondansetron (ZOFRAN-ODT) 4 MG disintegrating tablet Take 1 tablet (4 mg total) by mouth every 6 (six) hours as needed for nausea.   pantoprazole (PROTONIX) 40 MG tablet Take one tablet once daily before a meal, can take second tablet before evening if needed.   traMADol (ULTRAM) 50 MG tablet Take 1 tablet (50 mg total) by mouth every 6 (six) hours as needed for moderate pain or severe pain.   [DISCONTINUED] finasteride (PROSCAR) 5 MG tablet Take 1 tablet (5 mg total) by mouth daily.   [DISCONTINUED] tamsulosin (FLOMAX) 0.4 MG CAPS capsule TAKE 1 CAPSULE DAILY   finasteride (PROSCAR) 5 MG tablet Take 1 tablet (5 mg total) by mouth daily.   tamsulosin (FLOMAX) 0.4 MG CAPS capsule Take 1 capsule (0.4 mg total) by mouth daily.   No facility-administered encounter medications on file as of 10/26/2021.    Past Medical History:  Diagnosis Date   Abnormal prostate biopsy    Back pain    with radiculopathy   Cataracts, bilateral    Constipation    Family history of lung cancer    Family history of ovarian cancer    Family history of prostate cancer    Goiter    HOH (hard of hearing)    Hyperlipidemia    Hypertrophy of prostate    Malaise and fatigue    Obesity    Pyogenic granuloma    left cheeck   S/P colonoscopy June 2011   rectal bleeding secondary to hemorrhoids   S/P  colonoscopy 2007   [facility], diverticulosis per Dr. Juel Burrow  note   S/P endoscopy June 2011   Barrett's, mild gastritis and duodenitis   Seborrheic keratosis    Sleep apnea     Past Surgical History:  Procedure Laterality Date   BIOPSY  03/27/2019   Procedure: BIOPSY;  Surgeon: Danie Binder, MD;  [facility];  Service: Endoscopy;;   BIOPSY  08/16/2020   Procedure: BIOPSY;  Surgeon: Eloise Harman, DO;  [facility];  Service: Endoscopy;;   COLONOSCOPY  JUN 2011   PAN-COLONIC DIVERTICULOSIS   COLONOSCOPY  2007   [facility]   COLONOSCOPY WITH PROPOFOL N/A 08/16/2020   Procedure: COLONOSCOPY WITH PROPOFOL;  Surgeon: Eloise Harman, DO;  [facility];  Service: Endoscopy;  Laterality: N/A;  10:45am   ESOPHAGOGASTRODUODENOSCOPY  06/11/2011   Procedure: ESOPHAGOGASTRODUODENOSCOPY (EGD);  Surgeon: Dorothyann Peng, MD;  [facility];  Service: Endoscopy;  Laterality: N/A;  11:00   ESOPHAGOGASTRODUODENOSCOPY N/A 05/21/2014   Dr. Fields:Barrett's esophagus/ Medium sized hiatal hernia. path with indefinite dysplasia.    ESOPHAGOGASTRODUODENOSCOPY N/A 12/20/2014   Dr. Clayburn Pert gastric polyp/large HH/6 cm segment of Barrett's esophagus, path with hyperplastic gastric polyp, no dysplasia. Surveillance April 2017 due    ESOPHAGOGASTRODUODENOSCOPY N/A 01/20/2016   Procedure: ESOPHAGOGASTRODUODENOSCOPY (EGD);  Surgeon: Danie Binder, MD;  [facility];  Service: Endoscopy;  Laterality: N/A;  1230pm   ESOPHAGOGASTRODUODENOSCOPY N/A 03/27/2019   Procedure: ESOPHAGOGASTRODUODENOSCOPY (EGD);  Surgeon: Danie Binder, MD;  [facility];  Service: Endoscopy;  Laterality: N/A;  12:15pm   HERNIA REPAIR     LAPAROSCOPIC PARTIAL COLECTOMY Right 09/28/2020   Procedure: LAPAROSCOPIC RIGHT HEMI COLECTOMY;  Surgeon: Virl Cagey, MD;  Location: [facility];  Service: General;  Laterality: Right;   THYROIDECTOMY N/A 01/23/2021   Procedure:  TOTAL THYROIDECTOMY;  Surgeon: Virl Cagey, MD;  Location: [facility];  Service: General;  Laterality: N/A;   UMBILICAL HERNIA REPAIR     VARICOSE VEIN SURGERY      Social History   Socioeconomic History   Marital status: Divorced    Spouse name: Not on file   Number of children: Not on file   Years of education: Not on file   Highest education level: Not on file  Occupational History   Not on file  Tobacco Use   Smoking status: Former    Packs/day: 0.50    Years: 2.00    Pack years: 1.00    Types: Cigarettes    Quit date: 09/10/1963    Years since quitting: 58.1   Smokeless tobacco: Never   Tobacco comments:    quit in 1965  Vaping Use   Vaping Use: Former  Substance and Sexual Activity   Alcohol use: Not Currently   Drug use: No   Sexual activity: Not Currently  Other Topics Concern   Not on file  Social History Narrative   Not on file   Social Determinants of Health   Financial Resource Strain: Not on file  Food Insecurity: Not on file  Transportation Needs: Not on file  Physical Activity: Not on file  Stress: Not on file  Social Connections: Not on file  Intimate Partner Violence: Not on file    Family History  Problem Relation Age of Onset   Prostate cancer Father 79       metastatic   Lung cancer Brother 73       heavy smoker   Prostate cancer Paternal Uncle  metastatic   Alzheimer's disease Maternal Grandmother    Prostate cancer Maternal Grandfather 82       metastatic   Prostate cancer Paternal Grandfather 53       metastatic   Ovarian cancer Paternal Aunt 20   Prostate cancer Paternal Uncle        metastatic   Cancer Cousin 42       unknown type, paternal first cousin   Liver cancer Cousin        dx 13s, paternal first cousin   Colon polyps Neg Hx    Colon cancer Neg Hx        Objective: Vitals:   10/26/21 1552  BP: 126/76  Pulse: 69     Physical Exam  Lab Results:  Results for orders placed or performed in visit  on 10/26/21 (from the past 24 hour(s))  Urinalysis, Routine w reflex microscopic     Status: Abnormal   Collection Time: 10/26/21  4:19 PM  Result Value Ref Range   Specific Gravity, UA 1.020 1.005 - 1.030   pH, UA 6.5 5.0 - 7.5   Color, UA Yellow Yellow   Appearance Ur Clear Clear   Leukocytes,UA 1+ (A) Negative   Protein,UA Negative Negative/Trace   Glucose, UA Negative Negative   Ketones, UA Negative Negative   RBC, UA Trace (A) Negative   Bilirubin, UA Negative Negative   Urobilinogen, Ur 0.2 0.2 - 1.0 mg/dL   Nitrite, UA Negative Negative   Microscopic Examination See below:    Narrative   Performed at:  [facility] [address] [place], [place]  [id number] Lab Director: Mina Marble MT, Phone:  [phone]  Microscopic Examination     Status: Abnormal   Collection Time: 10/26/21  4:19 PM   Urine  Result Value Ref Range   WBC, UA 6-10 (A) 0 - 5 /hpf   RBC 0-2 0 - 2 /hpf   Epithelial Cells (non renal) 0-10 0 - 10 /hpf   Renal Epithel, UA None seen None seen /hpf   Crystals Present N/A   Crystal Type Amorphous Sediment N/A   Bacteria, UA Few (A) None seen/Few   Narrative   Performed at:  [facility] [address], [place], [place]  [id number] Lab Director: [place], Phone:  [phone]     BMET No results for input(s): NA, K, CL, CO2, GLUCOSE, BUN, CREATININE, CALCIUM in the last 72 hours. PSA Lab Results  Component Value Date   PSA1 5.8 (H) 10/16/2021   PSA1 5.2 (H) 07/17/2021   PSA1 4.9 (H) 04/06/2021     Lab Results  Component Value Date   PSA 3.5 03/04/2020   PSA Dr. [place] Pink of Urology follows 08/12/2008       Studies/Results: Narrative & Impression  CLINICAL DATA:  Colorectal cancer, surveillance; prostate cancer, surveillance; cecal cancer surveillance.   EXAM: CT ABDOMEN AND PELVIS WITH CONTRAST   TECHNIQUE: Multidetector CT imaging of the abdomen and pelvis was performed using the standard  protocol following bolus administration of intravenous contrast.   CONTRAST:  71m OMNIPAQUE IOHEXOL 300 MG/ML  SOLN   COMPARISON:  CT abdomen and pelvis 12/29/2020; CT chest-abdomen-pelvis 08/22/2020.   FINDINGS: Lower chest: No acute abnormality. 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   Hepatobiliary: Stable cyst within segment II of the left hepatic lobe measuring 1.8 cm. No suspicious liver abnormality identified. Gallbladder normal.   Pancreas: Unremarkable. No pancreatic ductal  dilatation or surrounding inflammatory changes.   Spleen: Normal in size without focal abnormality. Normal adrenal glands.   Adrenals/Urinary Tract: No kidney mass or nephrolithiasis identified bilaterally. Right-sided pelvocaliectasis. The right ureter is noted extending into the patient's right inguinal hernia. This is similar to previous exam. Bladder is unremarkable.   Stomach/Bowel: Small hiatal hernia. Status post right hemicolectomy. Colonic diverticulosis identified without signs of acute diverticulitis. No bowel wall thickening, inflammation, or distension identified.   Vascular/Lymphatic: Aortic atherosclerosis. No signs of abdominal aortic aneurysm. No abdominopelvic adenopathy.   Reproductive: Prostate gland enlargement.   Other: Bilateral inguinal hernias are identified which contain fat. On the right, the right ureter extends into the right hernia but does not appear dilated. No ascites or fluid collections within the abdomen or pelvis.   Musculoskeletal: Degenerative disc disease is noted within the lumbar spine, most advanced at L5-S1. There is 5 mm anterolisthesis of L4 on L5 is noted. Bones appear osteopenic. No acute or suspicious osseous findings. Remote healed right posterior rib fractures.   IMPRESSION: 1. No acute findings identified within the abdomen or pelvis. 2. Status post right hemicolectomy. No  findings of recurrent or metastatic disease. 3. Bilateral inguinal hernias which contain fat. The right ureter extends into the patient's right inguinal hernia but does not appear dilated. 4. 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 5. Aortic atherosclerosis (ICD10-I70.0).     Electronically Signed   By: Kerby Moors M.D.   On: 07/21/2021 14:38    Assessment & Plan: Prostate cancer on surveillance.   His PSA is rising slowly on finasteride.  A CT in 11/22 showed no concerning findings.   At his advanced age, continue surveillance is most appropriate.  I will have him f/u with a PSA in 3 mo.    BPH with BOO.  He is voiding well on tamsulosin and finasteride.    Meds ordered this encounter  Medications   finasteride (PROSCAR) 5 MG tablet    Sig: Take 1 tablet (5 mg total) by mouth daily.    Dispense:  90 tablet    Refill:  3   tamsulosin (FLOMAX) 0.4 MG CAPS capsule    Sig: Take 1 capsule (0.4 mg total) by mouth daily.    Dispense:  90 capsule    Refill:  3      Orders Placed This Encounter  Procedures   Microscopic Examination   Urinalysis, Routine w reflex microscopic   PSA    Standing Status:   Future    Standing Expiration Date:   10/26/2022      Return in about 6 months (around 04/25/2022) for with PSA. .   CC: [name], MD      Irine Seal 10/26/2021 Patient ID: Jerry Macdonald, male   DOB: November 07, 1930, 88 y.o.   MRN: [id number]

## 2021-11-06 ENCOUNTER — Inpatient Hospital Stay (HOSPITAL_COMMUNITY): Payer: Medicare Other | Attending: Hematology

## 2021-11-06 DIAGNOSIS — D649 Anemia, unspecified: Secondary | ICD-10-CM | POA: Insufficient documentation

## 2021-11-06 DIAGNOSIS — Z87891 Personal history of nicotine dependence: Secondary | ICD-10-CM | POA: Diagnosis not present

## 2021-11-06 DIAGNOSIS — Z801 Family history of malignant neoplasm of trachea, bronchus and lung: Secondary | ICD-10-CM | POA: Insufficient documentation

## 2021-11-06 DIAGNOSIS — C61 Malignant neoplasm of prostate: Secondary | ICD-10-CM | POA: Diagnosis not present

## 2021-11-06 DIAGNOSIS — Z8042 Family history of malignant neoplasm of prostate: Secondary | ICD-10-CM | POA: Insufficient documentation

## 2021-11-06 DIAGNOSIS — Z85038 Personal history of other malignant neoplasm of large intestine: Secondary | ICD-10-CM | POA: Diagnosis not present

## 2021-11-06 DIAGNOSIS — Z808 Family history of malignant neoplasm of other organs or systems: Secondary | ICD-10-CM | POA: Diagnosis not present

## 2021-11-06 DIAGNOSIS — C180 Malignant neoplasm of cecum: Secondary | ICD-10-CM

## 2021-11-06 DIAGNOSIS — Z8041 Family history of malignant neoplasm of ovary: Secondary | ICD-10-CM | POA: Insufficient documentation

## 2021-11-06 LAB — CBC WITH DIFFERENTIAL/PLATELET
Abs Immature Granulocytes: 0.03 10*3/uL (ref 0.00–0.07)
Basophils Absolute: 0 10*3/uL (ref 0.0–0.1)
Basophils Relative: 0 %
Eosinophils Absolute: 0.2 10*3/uL (ref 0.0–0.5)
Eosinophils Relative: 3 %
HCT: 41.7 % (ref 39.0–52.0)
Hemoglobin: 14.1 g/dL (ref 13.0–17.0)
Immature Granulocytes: 1 %
Lymphocytes Relative: 27 %
Lymphs Abs: 1.7 10*3/uL (ref 0.7–4.0)
MCH: 34.5 pg — ABNORMAL HIGH (ref 26.0–34.0)
MCHC: 33.8 g/dL (ref 30.0–36.0)
MCV: 102 fL — ABNORMAL HIGH (ref 80.0–100.0)
Monocytes Absolute: 0.7 10*3/uL (ref 0.1–1.0)
Monocytes Relative: 11 %
Neutro Abs: 3.7 10*3/uL (ref 1.7–7.7)
Neutrophils Relative %: 58 %
Platelets: 167 10*3/uL (ref 150–400)
RBC: 4.09 MIL/uL — ABNORMAL LOW (ref 4.22–5.81)
RDW: 12.7 % (ref 11.5–15.5)
WBC: 6.3 10*3/uL (ref 4.0–10.5)
nRBC: 0 % (ref 0.0–0.2)

## 2021-11-06 LAB — COMPREHENSIVE METABOLIC PANEL
ALT: 18 U/L (ref 0–44)
AST: 20 U/L (ref 15–41)
Albumin: 3.9 g/dL (ref 3.5–5.0)
Alkaline Phosphatase: 56 U/L (ref 38–126)
Anion gap: 6 (ref 5–15)
BUN: 21 mg/dL (ref 8–23)
CO2: 29 mmol/L (ref 22–32)
Calcium: 9 mg/dL (ref 8.9–10.3)
Chloride: 104 mmol/L (ref 98–111)
Creatinine, Ser: 0.89 mg/dL (ref 0.61–1.24)
GFR, Estimated: >60 mL/min (ref >60)
Glucose, Bld: 97 mg/dL (ref 70–99)
Potassium: 4.2 mmol/L (ref 3.5–5.1)
Sodium: 139 mmol/L (ref 135–145)
Total Bilirubin: 1.6 mg/dL — ABNORMAL HIGH (ref 0.3–1.2)
Total Protein: 7.1 g/dL (ref 6.5–8.1)

## 2021-11-06 LAB — IRON AND TIBC
Iron: 106 ug/dL (ref 45–182)
Saturation Ratios: 34 % (ref 17.9–39.5)
TIBC: 308 ug/dL (ref 250–450)
UIBC: 202 ug/dL

## 2021-11-06 LAB — FERRITIN: Ferritin: 20 ng/mL — ABNORMAL LOW (ref 24–336)

## 2021-11-07 ENCOUNTER — Other Ambulatory Visit (HOSPITAL_COMMUNITY): Payer: Medicare Other

## 2021-11-07 LAB — CEA: CEA: 2.7 ng/mL (ref 0.0–4.7)

## 2021-11-13 ENCOUNTER — Other Ambulatory Visit: Payer: Self-pay

## 2021-11-13 ENCOUNTER — Inpatient Hospital Stay (HOSPITAL_BASED_OUTPATIENT_CLINIC_OR_DEPARTMENT_OTHER): Payer: Medicare Other | Admitting: Hematology

## 2021-11-13 VITALS — BP 120/64 | HR 72 | Temp 97.2°F | Resp 18 | Ht 70.0 in | Wt 190.0 lb

## 2021-11-13 DIAGNOSIS — D649 Anemia, unspecified: Secondary | ICD-10-CM | POA: Diagnosis not present

## 2021-11-13 DIAGNOSIS — Z87891 Personal history of nicotine dependence: Secondary | ICD-10-CM | POA: Diagnosis not present

## 2021-11-13 DIAGNOSIS — Z801 Family history of malignant neoplasm of trachea, bronchus and lung: Secondary | ICD-10-CM | POA: Diagnosis not present

## 2021-11-13 DIAGNOSIS — C180 Malignant neoplasm of cecum: Secondary | ICD-10-CM | POA: Diagnosis not present

## 2021-11-13 DIAGNOSIS — Z85038 Personal history of other malignant neoplasm of large intestine: Secondary | ICD-10-CM | POA: Diagnosis not present

## 2021-11-13 DIAGNOSIS — C61 Malignant neoplasm of prostate: Secondary | ICD-10-CM | POA: Diagnosis not present

## 2021-11-13 DIAGNOSIS — D509 Iron deficiency anemia, unspecified: Secondary | ICD-10-CM | POA: Diagnosis not present

## 2021-11-13 DIAGNOSIS — Z8042 Family history of malignant neoplasm of prostate: Secondary | ICD-10-CM | POA: Diagnosis not present

## 2021-11-13 NOTE — Patient Instructions (Addendum)
[place] at [facility] ?Discharge Instructions ? ? ?You were seen and examined today by Dr. Delton Coombes. ? ?He reviewed the results of your lab work which is normal/stable. ? ?Return as scheduled in three months.  ? ? ?Thank you for choosing [place] at [facility] to provide your oncology and hematology care.  To afford each patient quality time with our provider, please arrive at least 15 minutes before your scheduled appointment time.  ? ?If you have a lab appointment with the [facility] please come in thru the Main Entrance and check in at the main information desk. ? ?You need to re-schedule your appointment should you arrive 10 or more minutes late.  We strive to give you quality time with our providers, and arriving late affects you and other patients whose appointments are after yours.  Also, if you no show three or more times for appointments you may be dismissed from the clinic at the providers discretion.     ?Again, thank you for choosing [facility].  Our hope is that these requests will decrease the amount of time that you wait before being seen by our physicians.       ?_____________________________________________________________ ? ?Should you have questions after your visit to [facility], please contact our office at [phone] and follow the prompts.  Our office hours are 8:00 a.m. and 4:30 p.m. Monday - Friday.  Please note that voicemails left after 4:00 p.m. may not be returned until the following business day.  We are closed weekends and major holidays.  You do have access to a nurse 24-7, just call the main number to the clinic [phone] and do not press any options, hold on the line and a nurse will answer the phone.   ? ?For prescription refill requests, have your pharmacy contact our office and allow 72 hours.   ? ?Due to Covid, you will need to wear a mask upon entering the hospital. If you do not have a  mask, a mask will be given to you at the Main Entrance upon arrival. For doctor visits, patients may have 1 support person age 74 or older with them. For treatment visits, patients can not have anyone with them due to social distancing guidelines and our immunocompromised population.  ? ?   ?

## 2021-11-13 NOTE — Progress Notes (Signed)
Jerry Macdonald, [place] [address]   CLINIC:  Medical Oncology/Hematology  PCP:  [name], MD [address] [place] [address] [phone]   REASON FOR VISIT:  Follow-up for cecal adenocarcinoma  PRIOR THERAPY: Laparoscopic right hemicolectomy on 09/28/2020  NGS Results: not done  CURRENT THERAPY: Oral iron tablet QOD  BRIEF ONCOLOGIC HISTORY:  Oncology History   No history exists.    CANCER STAGING:  Cancer Staging  No matching staging information was found for the patient.  INTERVAL HISTORY:  Jerry Macdonald, a 88 y.o. 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 Jerry Macdonald was last seen on 08/01/2021.   Today he reports feeling good. He denies pain at the site of his hernia.   REVIEW OF SYSTEMS:  Review of Systems  Constitutional:  Negative for appetite change and fatigue.  Neurological:  Positive for dizziness.  All other systems reviewed and are negative.  PAST MEDICAL/SURGICAL HISTORY:  Past Medical History:  Diagnosis Date   Abnormal prostate biopsy    Back pain    with radiculopathy   Cataracts, bilateral    Constipation    Family history of lung cancer    Family history of ovarian cancer    Family history of prostate cancer    Goiter    HOH (hard of hearing)    Hyperlipidemia    Hypertrophy of prostate    Malaise and fatigue    Obesity    Pyogenic granuloma    left cheeck   S/P colonoscopy June 2011   rectal bleeding secondary to hemorrhoids   S/P colonoscopy 2007   [facility] diverticulosis per Dr. Juel Burrow note   S/P endoscopy June 2011   Barrett's, mild gastritis and duodenitis   Seborrheic keratosis    Sleep apnea    Past Surgical History:  Procedure Laterality Date   BIOPSY  03/27/2019   Procedure: BIOPSY;  Surgeon: Danie Binder, MD;  [facility];  Service: Endoscopy;;   BIOPSY  08/16/2020   Procedure: BIOPSY;  Surgeon: Eloise Harman, DO;   [facility];  Service: Endoscopy;;   COLONOSCOPY  JUN 2011   PAN-COLONIC DIVERTICULOSIS   [facility]COPY  2007   [facility]   COLONOSCOPY WITH PROPOFOL N/A 08/16/2020   Procedure: COLONOSCOPY WITH PROPOFOL;  Surgeon: Eloise Harman, DO;  [facility];  Service: Endoscopy;  Laterality: N/A;  10:45am   ESOPHAGOGASTRODUODENOSCOPY  06/11/2011   Procedure: ESOPHAGOGASTRODUODENOSCOPY (EGD);  Surgeon: Dorothyann Peng, MD;  [facility];  Service: Endoscopy;  Laterality: N/A;  11:00   ESOPHAGOGASTRODUODENOSCOPY N/A 05/21/2014   Dr. Fields:Barrett's esophagus/ Medium sized hiatal hernia. path with indefinite dysplasia.    ESOPHAGOGASTRODUODENOSCOPY N/A 12/20/2014   Dr. Clayburn Pert gastric polyp/large HH/6 cm segment of Barrett's esophagus, path with hyperplastic gastric polyp, no dysplasia. Surveillance April 2017 due    ESOPHAGOGASTRODUODENOSCOPY N/A 01/20/2016   Procedure: ESOPHAGOGASTRODUODENOSCOPY (EGD);  Surgeon: Danie Binder, MD;  [facility];  Service: Endoscopy;  Laterality: N/A;  1230pm   ESOPHAGOGASTRODUODENOSCOPY N/A 03/27/2019   Procedure: ESOPHAGOGASTRODUODENOSCOPY (EGD);  Surgeon: Danie Binder, MD;  [facility];  Service: Endoscopy;  Laterality: N/A;  12:15pm   HERNIA REPAIR     LAPAROSCOPIC PARTIAL COLECTOMY Right 09/28/2020   Procedure: LAPAROSCOPIC RIGHT HEMI COLECTOMY;  Surgeon: Virl Cagey, MD;  Location: [facility];  Service: General;  Laterality: Right;   THYROIDECTOMY N/A  01/23/2021   Procedure: TOTAL THYROIDECTOMY;  Surgeon: Virl Cagey, MD;  Location: [facility];  Service: General;  Laterality: N/A;   UMBILICAL HERNIA REPAIR     VARICOSE VEIN SURGERY      SOCIAL HISTORY:  Social History   Socioeconomic History   Marital status: Divorced    Spouse name: Not on file   Number of children: Not on file   Years of education: Not on file   Highest education level: Not on file  Occupational History   Not on file   Tobacco Use   Smoking status: Former    Packs/day: 0.50    Years: 2.00    Pack years: 1.00    Types: Cigarettes    Quit date: 09/10/1963    Years since quitting: 58.2   Smokeless tobacco: Never   Tobacco comments:    quit in 1965  Vaping Use   Vaping Use: Former  Substance and Sexual Activity   Alcohol use: Not Currently   Drug use: No   Sexual activity: Not Currently  Other Topics Concern   Not on file  Social History Narrative   Not on file   Social Determinants of Health   Financial Resource Strain: Not on file  Food Insecurity: Not on file  Transportation Needs: Not on file  Physical Activity: Not on file  Stress: Not on file  Social Connections: Not on file  Intimate Partner Violence: Not on file    FAMILY HISTORY:  Family History  Problem Relation Age of Onset   Prostate cancer Father 56       metastatic   Lung cancer Brother 67       heavy smoker   Prostate cancer Paternal Uncle        metastatic   Alzheimer's disease Maternal Grandmother    Prostate cancer Maternal Grandfather 82       metastatic   Prostate cancer Paternal Grandfather 71       metastatic   Ovarian cancer Paternal Aunt 49   Prostate cancer Paternal Uncle        metastatic   Cancer Cousin 83       unknown type, paternal first cousin   Liver cancer Cousin        dx 20s, paternal first cousin   Colon polyps Neg Hx    Colon cancer Neg Hx     CURRENT MEDICATIONS:  Current Outpatient Medications  Medication Sig Dispense Refill   acetaminophen (TYLENOL) 500 MG tablet Take 500 mg by mouth every 8 (eight) hours as needed for moderate pain.     calcium-vitamin D (OSCAL WITH D) 500-5 MG-MCG tablet Take 1 tablet by mouth in the morning and at bedtime. Lunch and dinner     Cholecalciferol (VITAMIN D3) 25 MCG (1000 UT) CAPS Take 1 capsule by mouth 3 (three) times daily.     ferrous sulfate 324 MG TBEC Take 324 mg by mouth every other day.     finasteride (PROSCAR) 5 MG tablet Take 1 tablet (5  mg total) by mouth daily. 90 tablet 3   levothyroxine (SYNTHROID) 100 MCG tablet Take 1 tablet (100 mcg total) by mouth daily before breakfast. 90 tablet 1   magnesium oxide (MAG-OX) 400 MG tablet Take 400 mg by mouth 2 (two) times daily.     Multiple Vitamins-Minerals (MULTIVITAMINS THER. W/MINERALS) TABS Take 1 tablet by mouth daily.     ondansetron (ZOFRAN-ODT) 4 MG disintegrating tablet Take 1 tablet (4 mg total) by mouth  every 6 (six) hours as needed for nausea. 20 tablet 0   pantoprazole (PROTONIX) 40 MG tablet Take one tablet once daily before a meal, can take second tablet before evening if needed. 180 tablet 3   tamsulosin (FLOMAX) 0.4 MG CAPS capsule Take 1 capsule (0.4 mg total) by mouth daily. 90 capsule 3   traMADol (ULTRAM) 50 MG tablet Take 1 tablet (50 mg total) by mouth every 6 (six) hours as needed for moderate pain or severe pain. 30 tablet 0   No current facility-administered medications for this visit.    ALLERGIES:  Allergies  Allergen Reactions   Penicillins     Swelling of hands and feet - severe Did it involve swelling of the face/tongue/throat, SOB, or low BP? No Did it involve sudden or severe rash/hives, skin peeling, or any reaction on the inside of your mouth or nose? No Did you need to seek medical attention at a hospital or doctor's office? No When did it last happen?      1950s If all above answers are NO, may proceed with cephalosporin use.    Penicillin G Other (See Comments)    PHYSICAL EXAM:  Performance status (ECOG): 0 - Asymptomatic  Vitals:   11/13/21 1116  BP: 120/64  Pulse: 72  Resp: 18  Temp: (!) 97.2 F (36.2 C)  SpO2: 98%   Wt Readings from Last 3 Encounters:  11/13/21 190 lb (86.2 kg)  10/26/21 190 lb (86.2 kg)  08/07/21 202 lb (91.6 kg)   Physical Exam Vitals reviewed.  Constitutional:      Appearance: Normal appearance.  Cardiovascular:     Rate and Rhythm: Normal rate and regular rhythm.     Pulses: Normal pulses.      Heart sounds: Normal heart sounds.  Pulmonary:     Effort: Pulmonary effort is normal.     Breath sounds: Normal breath sounds.  Abdominal:     General: A surgical scar is present.     Palpations: Abdomen is soft. There is no hepatomegaly, splenomegaly or mass.     Tenderness: There is no abdominal tenderness.     Hernia: A hernia is present. Hernia is present in the umbilical area (incisional hernia above umbilicus).  Neurological:     General: No focal deficit present.     Mental Status: He is alert and oriented to person, place, and time.  Psychiatric:        Mood and Affect: Mood normal.        Behavior: Behavior normal.     LABORATORY DATA:  I have reviewed the labs as listed.  CBC Latest Ref Rng & Units 11/06/2021 07/21/2021 04/17/2021  WBC 4.0 - 10.5 K/uL 6.3 7.3 7.0  Hemoglobin 13.0 - 17.0 g/dL 14.1 14.9 13.4  Hematocrit 39.0 - 52.0 % 41.7 43.2 40.0  Platelets 150 - 400 K/uL 167 191 184   CMP Latest Ref Rng & Units 11/06/2021 07/21/2021 04/17/2021  Glucose 70 - 99 mg/dL 97 102(H) 125(H)  BUN 8 - 23 mg/dL 21 21 19   Creatinine 0.61 - 1.24 mg/dL 0.89 0.80 0.80  Sodium 135 - 145 mmol/L 139 137 138  Potassium 3.5 - 5.1 mmol/L 4.2 4.4 4.5  Chloride 98 - 111 mmol/L 104 100 105  CO2 22 - 32 mmol/L 29 29 28   Calcium 8.9 - 10.3 mg/dL 9.0 9.2 8.8(L)  Total Protein 6.5 - 8.1 g/dL 7.1 7.2 6.7  Total Bilirubin 0.3 - 1.2 mg/dL 1.6(H) 1.9(H) 1.4(H)  Alkaline Phos  38 - 126 U/L 56 61 56  AST 15 - 41 U/L 20 26 25   ALT 0 - 44 U/L 18 21 21     DIAGNOSTIC IMAGING:  I have independently reviewed the scans and discussed with the patient. No results found.   ASSESSMENT:  1.  Cecal adenocarcinoma: -Colonoscopy by Dr. Abbey Chatters on 08/16/2020 with fungating/infiltrative submucosal and ulcerated partially obstructing mass found in the cecum and at the ileocecal valve. -Biopsy consistent with adenocarcinoma, MMR preserved. -CT CAP on 08/22/2020 with cycle carcinoma with small ileocolic mesenteric  lymph nodes with no evidence of distant metastatic spread.   2. Social/family history: -He is retired from [employer]. He had asbestos exposure. -Maternal and paternal grandfathers died of prostate cancer. Father died of prostate cancer. 2 paternal uncles had prostate cancer.   PLAN:  1.  Stage II (PT3PN0) cecal adenocarcinoma, MMR preserved: - Denies any bleeding per rectum or change in bowel habits. - Last CTAP with contrast from 07/21/2021 did not show any evidence of recurrence. - Labs from 11/06/2021 shows CEA 2.7.  CBC and LFTs are normal. - RTC 3 months for follow-up with repeat labs and CTAP with contrast.   2. Prostate cancer: - Last PSA was 5.8 on 10/16/2021.  Continue follow-up with urology.   3. Family history: - Genetic testing was recommended with extensive family history and personal history.  He declined.   4. Normocytic anemia: - Ferritin is 20 and hemoglobin is 14.1. - Continue iron tablet 3 times weekly.   Orders placed this encounter:  Orders Placed This Encounter  Procedures   CT Abdomen Pelvis [place], MD [place] [phone]   I, Thana Ates, am acting as a scribe for Dr. Kinnie Scales, Derek Jack MD, have reviewed the above documentation for accuracy and completeness, and I agree with the above.

## 2021-11-14 ENCOUNTER — Ambulatory Visit (HOSPITAL_COMMUNITY): Payer: Medicare Other | Admitting: Hematology

## 2021-11-28 ENCOUNTER — Other Ambulatory Visit: Payer: Self-pay

## 2021-11-28 ENCOUNTER — Encounter: Payer: Self-pay | Admitting: General Surgery

## 2021-11-28 ENCOUNTER — Ambulatory Visit (INDEPENDENT_AMBULATORY_CARE_PROVIDER_SITE_OTHER): Payer: Medicare Other | Admitting: General Surgery

## 2021-11-28 VITALS — BP 120/72 | HR 70 | Temp 97.7°F | Resp 16 | Ht 70.5 in | Wt 190.0 lb

## 2021-11-28 DIAGNOSIS — K4020 Bilateral inguinal hernia, without obstruction or gangrene, not specified as recurrent: Secondary | ICD-10-CM | POA: Insufficient documentation

## 2021-11-28 DIAGNOSIS — K432 Incisional hernia without obstruction or gangrene: Secondary | ICD-10-CM | POA: Insufficient documentation

## 2021-11-28 NOTE — Patient Instructions (Signed)
Will review the CT scan on 02/12/22 and call you within a few days.  ?If things get worse or larger or more pain call the office, we can always get the scan sooner.  ? ?

## 2021-11-28 NOTE — Progress Notes (Signed)
[facility] Note  ? ?HPI:  ?88 y.o. Male presents to clinic for follow-up evaluation after his right hemicolectomy for incisional hernia concerns. Last November he did not have a hernia but this has been getting worse. He now has two bulges. He denies any pain or issues with obstruction. ? ?Review of Systems:  ?Bulges at the inferior and superior part of the incisions ?All other review of systems: otherwise negative  ? ?Vital Signs:  ?BP 120/72   Pulse 70   Temp 97.7 ?F (36.5 ?C) (Other (Comment))   Resp 16   Ht 5' 10.5" (1.791 m)   Wt 190 lb (86.2 kg)   SpO2 96%   BMI 26.88 kg/m?   ? ?Physical Exam:  ?Physical Exam ?Vitals reviewed.  ?Constitutional:   ?   Appearance: He is obese.  ?HENT:  ?   Head: Normocephalic.  ?   Mouth/Throat:  ?   Mouth: Mucous membranes are moist.  ?Eyes:  ?   Pupils: Pupils are equal, round, and reactive to light.  ?Pulmonary:  ?   Effort: Pulmonary effort is normal.  ?Abdominal:  ?   Palpations: Abdomen is soft.  ?   Hernia: A hernia is present.  ?   Comments: Round belly, incision with superior and inferior bulges, reducible  ?Musculoskeletal:     ?   General: Normal range of motion.  ?Skin: ?   General: Skin is warm.  ?Neurological:  ?   General: No focal deficit present.  ?   Mental Status: He is alert.  ? ? ? ?Assessment:  ?88 y.o. yo Male with incisional hernias in the superior and inferior portions. He wants to see what the CT shows in July.  ? ?Plan:  ?Will review the CT scan on 02/12/22 and call you within a few days.  ?If things get worse or larger or more pain call the office, we can always get the scan sooner.  ?He is unsure if he wants it fixed but his friend, Izora Gala thinks he should. He is going to see what the CT shows and go from there. Discussed reasons to go to the ED.  ? ?Future Appointments  ?Date Time Provider [place]  ?12/11/2021 11:30 AM Nida, Marella Chimes, MD REA-REA None  ?02/12/2022 10:00 AM AP-ACAPA LAB AP-ACAPA None  ?02/12/2022  11:00 AM AP-CT 1 AP-CT [place] [facility]  ?02/19/2022  3:00 PM Derek Jack, MD AP-ACAPA None  ?04/23/2022 11:00 AM AUR-LAB AUR-AUR None  ?04/26/2022  3:15 PM Irine Seal, MD AUR-AUR None  ? ? ? ? ?Curlene Labrum, MD ?[facility] ?[place], [place] [address] ?[phone] (office) ? ?

## 2021-12-04 DIAGNOSIS — E890 Postprocedural hypothyroidism: Secondary | ICD-10-CM | POA: Diagnosis not present

## 2021-12-05 LAB — T4, FREE: Free T4: 1.35 ng/dL (ref 0.82–1.77)

## 2021-12-05 LAB — TSH: TSH: 0.778 u[IU]/mL (ref 0.450–4.500)

## 2021-12-10 IMAGING — DX DG CHEST 1V PORT
1 series · 1 of 1 positions shown · non-contrast
Comparison: CT chest, abdomen and pelvis 08/22/2020.

CLINICAL DATA: Patient status post thyroidectomy today. Question
pneumothorax.

EXAM:
PORTABLE CHEST 1 VIEW

[chest ap]
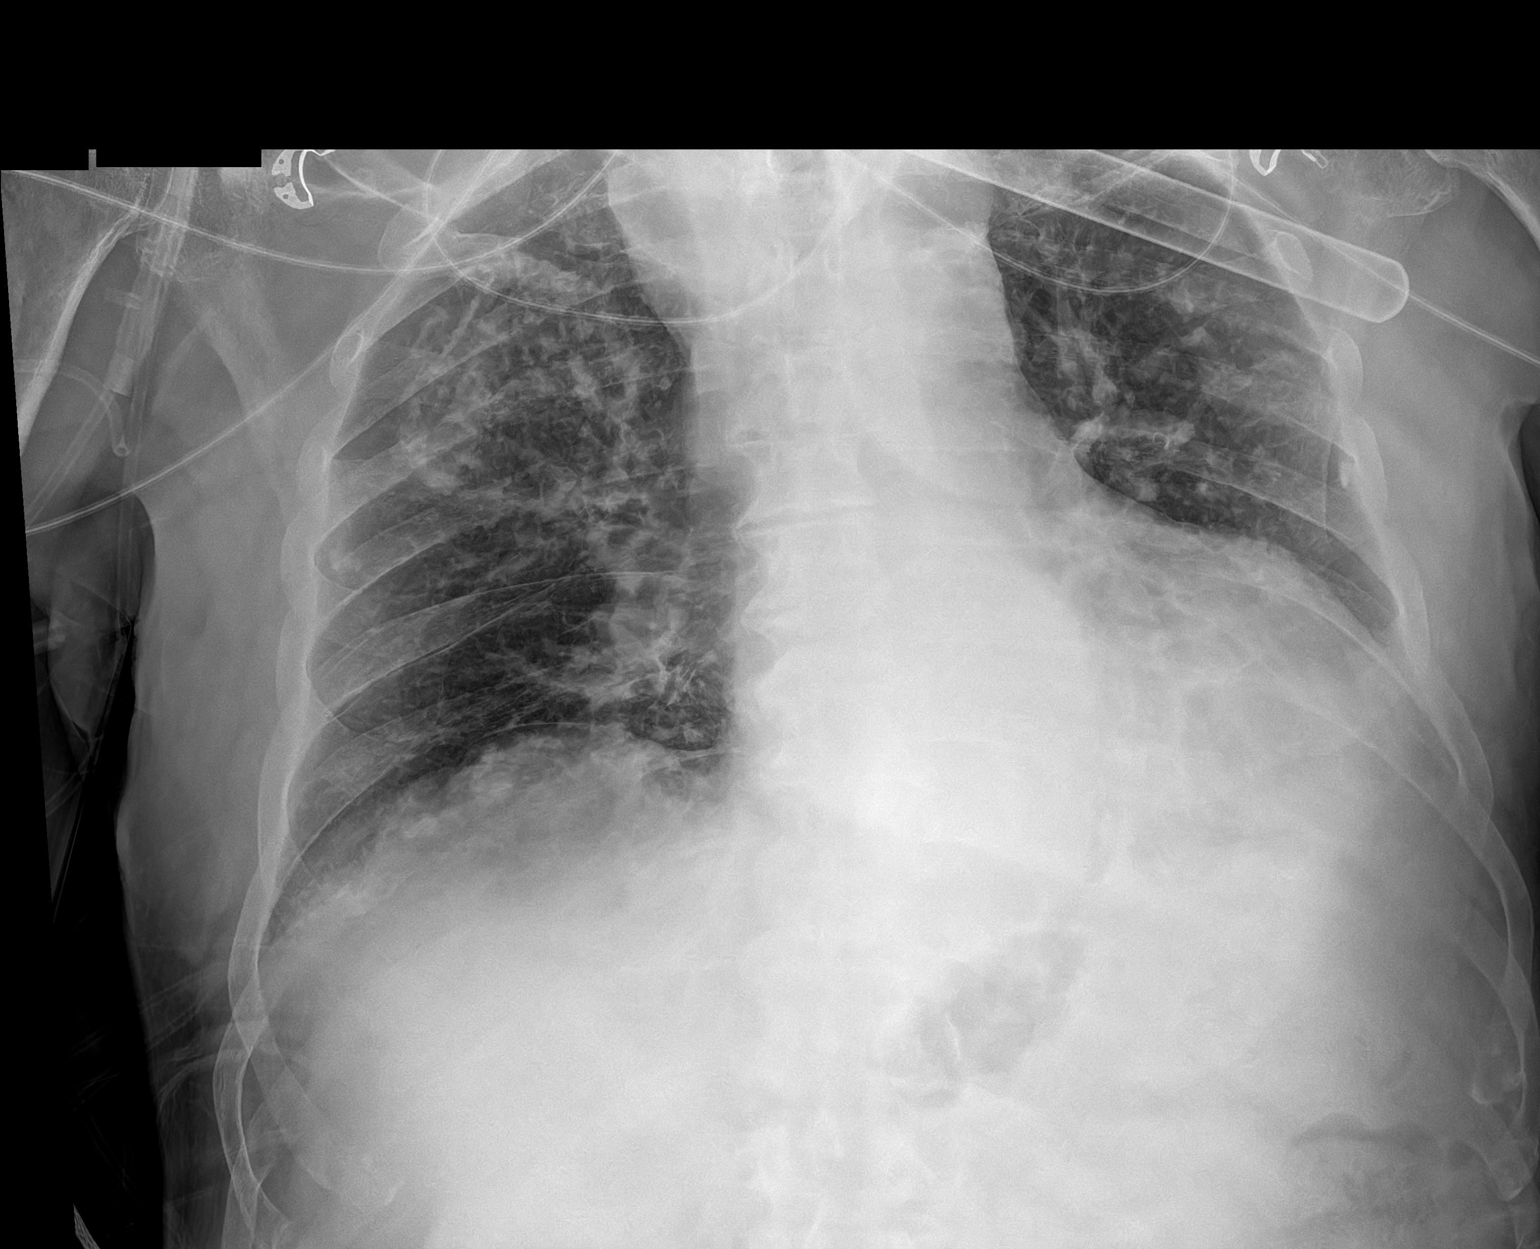

[1 of 1 positions shown; findings below may reference images not displayed]

FINDINGS: No pneumothorax is identified. There is some basilar atelectasis.
Heart size is upper normal. Lung volumes are low. No pleural fluid.
No acute bony abnormality.
IMPRESSION: Negative for pneumothorax.  No acute finding.

## 2021-12-11 ENCOUNTER — Ambulatory Visit (INDEPENDENT_AMBULATORY_CARE_PROVIDER_SITE_OTHER): Payer: Medicare Other | Admitting: "Endocrinology

## 2021-12-11 ENCOUNTER — Encounter: Payer: Self-pay | Admitting: "Endocrinology

## 2021-12-11 VITALS — BP 116/58 | HR 76 | Ht 70.5 in | Wt 202.8 lb

## 2021-12-11 DIAGNOSIS — E890 Postprocedural hypothyroidism: Secondary | ICD-10-CM | POA: Diagnosis not present

## 2021-12-11 MED ORDER — LEVOTHYROXINE SODIUM 100 MCG PO TABS: 100.0000 ug | ORAL_TABLET | Freq: Every day | ORAL | 1 refills | Status: DC

## 2021-12-11 MED ORDER — OYSTER SHELL CALCIUM/D3 500-5 MG-MCG PO TABS: 1.0000 | ORAL_TABLET | Freq: Two times a day (BID) | ORAL | 1 refills | Status: AC

## 2021-12-11 NOTE — Progress Notes (Signed)
? ?    ?                                           12/11/2021, 12:53 PM ? ?      ? ?Endocrinology follow-up note ? ? ?Subjective:  ? ? Patient ID: Jerry Macdonald, male    DOB: [birth year], PCP [name], MD ? ? ?Past Medical History:  ?Diagnosis Date  ? Abnormal prostate biopsy   ? Back pain   ? with radiculopathy  ? Cataracts, bilateral   ? Constipation   ? Family history of lung cancer   ? Family history of ovarian cancer   ? Family history of prostate cancer   ? Goiter   ? HOH (hard of hearing)   ? Hyperlipidemia   ? Hypertrophy of prostate   ? Malaise and fatigue   ? Obesity   ? Pyogenic granuloma   ? left cheeck  ? S/P colonoscopy June 2011  ? rectal bleeding secondary to hemorrhoids  ? S/P colonoscopy 2007  ? [facility], diverticulosis per Dr. Juel Burrow note  ? S/P endoscopy June 2011  ? Barrett's, mild gastritis and duodenitis  ? Seborrheic keratosis   ? Sleep apnea   ? ?Past Surgical History:  ?Procedure Laterality Date  ? BIOPSY  03/27/2019  ? Procedure: BIOPSY;  Surgeon: Danie Binder, MD;  [facility];  Service: Endoscopy;;  ? BIOPSY  08/16/2020  ? Procedure: BIOPSY;  Surgeon: Eloise Harman, DO;  [facility];  Service: Endoscopy;;  ? COLONOSCOPY  JUN 2011  ? PAN-COLONIC DIVERTICULOSIS  ? [facility]COPY  2007  ? [facility]  ? COLONOSCOPY WITH PROPOFOL N/A 08/16/2020  ? Procedure: COLONOSCOPY WITH PROPOFOL;  Surgeon: Eloise Harman, DO;  [facility];  Service: Endoscopy;  Laterality: N/A;  10:45am  ? ESOPHAGOGASTRODUODENOSCOPY  06/11/2011  ? Procedure: ESOPHAGOGASTRODUODENOSCOPY (EGD);  Surgeon: Dorothyann Peng, MD;  [facility];  Service: Endoscopy;  Laterality: N/A;  11:00  ? ESOPHAGOGASTRODUODENOSCOPY N/A 05/21/2014  ? Dr. Fields:Barrett's esophagus/ Medium sized hiatal hernia. path with indefinite dysplasia.   ? ESOPHAGOGASTRODUODENOSCOPY N/A 12/20/2014  ? Dr. Clayburn Pert gastric polyp/large HH/6 cm segment of Barrett's esophagus, path with  hyperplastic gastric polyp, no dysplasia. Surveillance April 2017 due   ? ESOPHAGOGASTRODUODENOSCOPY N/A 01/20/2016  ? Procedure: ESOPHAGOGASTRODUODENOSCOPY (EGD);  Surgeon: Danie Binder, MD;  [facility];  Service: Endoscopy;  Laterality: N/A;  1230pm  ? ESOPHAGOGASTRODUODENOSCOPY N/A 03/27/2019  ? Procedure: ESOPHAGOGASTRODUODENOSCOPY (EGD);  Surgeon: Danie Binder, MD;  [facility];  Service: Endoscopy;  Laterality: N/A;  12:15pm  ? HERNIA REPAIR    ? LAPAROSCOPIC PARTIAL COLECTOMY Right 09/28/2020  ? Procedure: LAPAROSCOPIC RIGHT HEMI COLECTOMY;  Surgeon: Virl Cagey, MD;  Location: [facility];  Service: General;  Laterality: Right;  ? THYROIDECTOMY N/A 01/23/2021  ? Procedure: TOTAL THYROIDECTOMY;  Surgeon: Virl Cagey, MD;  Location: [facility];  Service: General;  Laterality: N/A;  ? UMBILICAL HERNIA REPAIR    ? VARICOSE VEIN SURGERY    ? ?Social History  ? ?Socioeconomic History  ? Marital status: Divorced  ?  Spouse name: Not on file  ? Number of children: Not on file  ? Years of education: Not on file  ? Highest education level: Not on file  ?Occupational History  ? Not on  file  ?Tobacco Use  ? Smoking status: Former  ?  Packs/day: 0.50  ?  Years: 2.00  ?  Pack years: 1.00  ?  Types: Cigarettes  ?  Quit date: 09/10/1963  ?  Years since quitting: 58.2  ? Smokeless tobacco: Never  ? Tobacco comments:  ?  quit in 1965  ?Vaping Use  ? Vaping Use: Former  ?Substance and Sexual Activity  ? Alcohol use: Not Currently  ? Drug use: No  ? Sexual activity: Not Currently  ?Other Topics Concern  ? Not on file  ?Social History Narrative  ? Not on file  ? ?Social Determinants of Health  ? ?Financial Resource Strain: Not on file  ?Food Insecurity: Not on file  ?Transportation Needs: Not on file  ?Physical Activity: Not on file  ?Stress: Not on file  ?Social Connections: Not on file  ? ?Family History  ?Problem Relation Age of Onset  ? Prostate cancer Father 47  ?     metastatic  ? Lung cancer  Brother 19  ?     heavy smoker  ? Prostate cancer Paternal Uncle   ?     metastatic  ? Alzheimer's disease Maternal Grandmother   ? Prostate cancer Maternal Grandfather 21  ?     metastatic  ? Prostate cancer Paternal Grandfather 33  ?     metastatic  ? Ovarian cancer Paternal Aunt 50  ? Prostate cancer Paternal Uncle   ?     metastatic  ? Cancer Cousin 23  ?     unknown type, paternal first cousin  ? Liver cancer Cousin   ?     dx 35s, paternal first cousin  ? Colon polyps Neg Hx   ? Colon cancer Neg Hx   ? ?Outpatient Encounter Medications as of 12/11/2021  ?Medication Sig  ? acetaminophen (TYLENOL) 500 MG tablet Take 500 mg by mouth every 8 (eight) hours as needed for moderate pain.  ? calcium-vitamin D (OSCAL WITH D) 500-5 MG-MCG tablet Take 1 tablet by mouth in the morning and at bedtime. Lunch and dinner  ? Cholecalciferol (VITAMIN D3) 25 MCG (1000 UT) CAPS Take 1 capsule by mouth 3 (three) times daily.  ? ferrous sulfate 324 MG TBEC Take 324 mg by mouth every other day.  ? finasteride (PROSCAR) 5 MG tablet Take 1 tablet (5 mg total) by mouth daily.  ? levothyroxine (SYNTHROID) 100 MCG tablet Take 1 tablet (100 mcg total) by mouth daily before breakfast.  ? magnesium oxide (MAG-OX) 400 MG tablet Take 400 mg by mouth 2 (two) times daily.  ? Multiple Vitamins-Minerals (MULTIVITAMINS THER. W/MINERALS) TABS Take 1 tablet by mouth daily.  ? ondansetron (ZOFRAN-ODT) 4 MG disintegrating tablet Take 1 tablet (4 mg total) by mouth every 6 (six) hours as needed for nausea.  ? pantoprazole (PROTONIX) 40 MG tablet Take one tablet once daily before a meal, can take second tablet before evening if needed.  ? tamsulosin (FLOMAX) 0.4 MG CAPS capsule Take 1 capsule (0.4 mg total) by mouth daily.  ? traMADol (ULTRAM) 50 MG tablet Take 1 tablet (50 mg total) by mouth every 6 (six) hours as needed for moderate pain or severe pain.  ? [DISCONTINUED] calcium-vitamin D (OSCAL WITH D) 500-5 MG-MCG tablet Take 1 tablet by mouth in the  morning and at bedtime. Lunch and dinner  ? [DISCONTINUED] levothyroxine (SYNTHROID) 100 MCG tablet Take 1 tablet (100 mcg total) by mouth daily before breakfast.  ? ?No facility-administered encounter  medications on file as of 12/11/2021.  ? ?ALLERGIES: ?Allergies  ?Allergen Reactions  ? Penicillins   ?  Swelling of hands and feet - severe ?Did it involve swelling of the face/tongue/throat, SOB, or low BP? No ?Did it involve sudden or severe rash/hives, skin peeling, or any reaction on the inside of your mouth or nose? No ?Did you need to seek medical attention at a hospital or doctor's office? No ?When did it last happen?      1950s ?If all above answers are ?NO?, may proceed with cephalosporin use. ?  ? Penicillin G Other (See Comments)  ? ? ?VACCINATION STATUS: ?Immunization History  ?Administered Date(s) Administered  ? DTaP 06/03/2013  ? H1N1 08/12/2008  ? Influenza Whole 07/07/2007, 05/20/2008  ? Influenza-Unspecified 06/03/2013, 06/03/2018  ? Pneumococcal-Unspecified 06/03/2013, 06/03/2014  ? Varicella 06/03/2013  ? ? ?HPI ?Jerry Macdonald is 88 y.o. male who is seen in follow-up for history of compressive multinodular goiter.  He underwent total thyroidectomy on Jan 23, 2021 by Dr. Blake Divine.   ?His surgical sample is negative for malignancy.   ? ?- He reports feeling better, previous compressive symptoms have resolved.  He is currently on levothyroxine 100 mcg p.o. daily before breakfast, continues to tolerate.  His previsit labs are consistent with appropriate replacement.  ? ?  He is accompanied by  Kathee Polite his friend and power of attorney during this telephone visit.  ?PMD:  [name], MD.  ? He is not an optimal historian.  History is obtained mainly from chart review. he has been dealing with symptoms of fatigue, positional shortness of breath, sleep apnea for several years. ?He reports that he breathes better, swallows without difficulty. . ? ?He is a former smoker, denies any exposure  to neck radiations. ?His other medical problems include BPH/prostate cancer, Barrett's esophagus, sleep apnea, and recently diagnosed cecal/colon cancer. ?He is cared for by an elderly friend Dola Argyle is

## 2022-01-01 DIAGNOSIS — H6121 Impacted cerumen, right ear: Secondary | ICD-10-CM | POA: Diagnosis not present

## 2022-01-01 DIAGNOSIS — H9201 Otalgia, right ear: Secondary | ICD-10-CM | POA: Diagnosis not present

## 2022-01-01 DIAGNOSIS — H6691 Otitis media, unspecified, right ear: Secondary | ICD-10-CM | POA: Diagnosis not present

## 2022-02-07 ENCOUNTER — Other Ambulatory Visit: Payer: Self-pay | Admitting: Urology

## 2022-02-07 DIAGNOSIS — N401 Enlarged prostate with lower urinary tract symptoms: Secondary | ICD-10-CM

## 2022-02-12 ENCOUNTER — Inpatient Hospital Stay (HOSPITAL_COMMUNITY): Payer: Medicare Other | Attending: Hematology

## 2022-02-12 ENCOUNTER — Ambulatory Visit (HOSPITAL_COMMUNITY)
Admission: RE | Admit: 2022-02-12 | Discharge: 2022-02-12 | Disposition: A | Discharge status: Home / Self Care | Payer: Medicare Other | Source: Ambulatory Visit | Attending: Hematology | Admitting: Hematology

## 2022-02-12 DIAGNOSIS — Z8041 Family history of malignant neoplasm of ovary: Secondary | ICD-10-CM | POA: Insufficient documentation

## 2022-02-12 DIAGNOSIS — Z87891 Personal history of nicotine dependence: Secondary | ICD-10-CM | POA: Insufficient documentation

## 2022-02-12 DIAGNOSIS — K449 Diaphragmatic hernia without obstruction or gangrene: Secondary | ICD-10-CM | POA: Diagnosis not present

## 2022-02-12 DIAGNOSIS — C180 Malignant neoplasm of cecum: Secondary | ICD-10-CM | POA: Insufficient documentation

## 2022-02-12 DIAGNOSIS — N400 Enlarged prostate without lower urinary tract symptoms: Secondary | ICD-10-CM | POA: Diagnosis not present

## 2022-02-12 DIAGNOSIS — D509 Iron deficiency anemia, unspecified: Secondary | ICD-10-CM

## 2022-02-12 DIAGNOSIS — K5730 Diverticulosis of large intestine without perforation or abscess without bleeding: Secondary | ICD-10-CM | POA: Diagnosis not present

## 2022-02-12 DIAGNOSIS — K4020 Bilateral inguinal hernia, without obstruction or gangrene, not specified as recurrent: Secondary | ICD-10-CM | POA: Diagnosis not present

## 2022-02-12 DIAGNOSIS — D649 Anemia, unspecified: Secondary | ICD-10-CM | POA: Insufficient documentation

## 2022-02-12 DIAGNOSIS — Z801 Family history of malignant neoplasm of trachea, bronchus and lung: Secondary | ICD-10-CM | POA: Insufficient documentation

## 2022-02-12 DIAGNOSIS — Z808 Family history of malignant neoplasm of other organs or systems: Secondary | ICD-10-CM | POA: Insufficient documentation

## 2022-02-12 DIAGNOSIS — C61 Malignant neoplasm of prostate: Secondary | ICD-10-CM | POA: Insufficient documentation

## 2022-02-12 DIAGNOSIS — Z8042 Family history of malignant neoplasm of prostate: Secondary | ICD-10-CM | POA: Insufficient documentation

## 2022-02-12 LAB — CBC WITH DIFFERENTIAL/PLATELET
Abs Immature Granulocytes: 0.05 10*3/uL (ref 0.00–0.07)
Basophils Absolute: 0 10*3/uL (ref 0.0–0.1)
Basophils Relative: 0 %
Eosinophils Absolute: 0.2 10*3/uL (ref 0.0–0.5)
Eosinophils Relative: 2 %
HCT: 42.9 % (ref 39.0–52.0)
Hemoglobin: 14.3 g/dL (ref 13.0–17.0)
Immature Granulocytes: 1 %
Lymphocytes Relative: 24 %
Lymphs Abs: 2.2 10*3/uL (ref 0.7–4.0)
MCH: 33.3 pg (ref 26.0–34.0)
MCHC: 33.3 g/dL (ref 30.0–36.0)
MCV: 99.8 fL (ref 80.0–100.0)
Monocytes Absolute: 1.1 10*3/uL — ABNORMAL HIGH (ref 0.1–1.0)
Monocytes Relative: 12 %
Neutro Abs: 5.5 10*3/uL (ref 1.7–7.7)
Neutrophils Relative %: 61 %
Platelets: 183 10*3/uL (ref 150–400)
RBC: 4.3 MIL/uL (ref 4.22–5.81)
RDW: 12.5 % (ref 11.5–15.5)
WBC: 8.9 10*3/uL (ref 4.0–10.5)
nRBC: 0 % (ref 0.0–0.2)

## 2022-02-12 LAB — COMPREHENSIVE METABOLIC PANEL
ALT: 18 U/L (ref 0–44)
AST: 21 U/L (ref 15–41)
Albumin: 3.9 g/dL (ref 3.5–5.0)
Alkaline Phosphatase: 57 U/L (ref 38–126)
Anion gap: 4 — ABNORMAL LOW (ref 5–15)
BUN: 22 mg/dL (ref 8–23)
CO2: 29 mmol/L (ref 22–32)
Calcium: 9.1 mg/dL (ref 8.9–10.3)
Chloride: 103 mmol/L (ref 98–111)
Creatinine, Ser: 0.77 mg/dL (ref 0.61–1.24)
GFR, Estimated: >60 mL/min (ref >60)
Glucose, Bld: 96 mg/dL (ref 70–99)
Potassium: 4.4 mmol/L (ref 3.5–5.1)
Sodium: 136 mmol/L (ref 135–145)
Total Bilirubin: 1.5 mg/dL — ABNORMAL HIGH (ref 0.3–1.2)
Total Protein: 7.1 g/dL (ref 6.5–8.1)

## 2022-02-12 LAB — IRON AND TIBC
Iron: 93 ug/dL (ref 45–182)
Saturation Ratios: 32 % (ref 17.9–39.5)
TIBC: 290 ug/dL (ref 250–450)
UIBC: 197 ug/dL

## 2022-02-12 LAB — FERRITIN: Ferritin: 22 ng/mL — ABNORMAL LOW (ref 24–336)

## 2022-02-12 MED ORDER — IOHEXOL 300 MG/ML  SOLN
100.0000 mL | Freq: Once | INTRAMUSCULAR | Status: AC | PRN
Start: 2022-02-12 — End: 2022-02-12
  Administered 2022-02-12: 100 mL via INTRAVENOUS

## 2022-02-13 LAB — CEA: CEA: 2.7 ng/mL (ref 0.0–4.7)

## 2022-02-19 ENCOUNTER — Inpatient Hospital Stay (HOSPITAL_BASED_OUTPATIENT_CLINIC_OR_DEPARTMENT_OTHER): Payer: Medicare Other | Admitting: Hematology

## 2022-02-19 DIAGNOSIS — Z87891 Personal history of nicotine dependence: Secondary | ICD-10-CM | POA: Diagnosis not present

## 2022-02-19 DIAGNOSIS — Z8041 Family history of malignant neoplasm of ovary: Secondary | ICD-10-CM | POA: Diagnosis not present

## 2022-02-19 DIAGNOSIS — Z808 Family history of malignant neoplasm of other organs or systems: Secondary | ICD-10-CM | POA: Diagnosis not present

## 2022-02-19 DIAGNOSIS — Z8042 Family history of malignant neoplasm of prostate: Secondary | ICD-10-CM | POA: Diagnosis not present

## 2022-02-19 DIAGNOSIS — C61 Malignant neoplasm of prostate: Secondary | ICD-10-CM | POA: Diagnosis not present

## 2022-02-19 DIAGNOSIS — C180 Malignant neoplasm of cecum: Secondary | ICD-10-CM | POA: Diagnosis not present

## 2022-02-19 DIAGNOSIS — Z801 Family history of malignant neoplasm of trachea, bronchus and lung: Secondary | ICD-10-CM | POA: Diagnosis not present

## 2022-02-19 DIAGNOSIS — D509 Iron deficiency anemia, unspecified: Secondary | ICD-10-CM | POA: Diagnosis not present

## 2022-02-19 DIAGNOSIS — D649 Anemia, unspecified: Secondary | ICD-10-CM | POA: Diagnosis not present

## 2022-02-19 NOTE — Progress Notes (Signed)
[place], [place] [address]   CLINIC:  Medical Oncology/Hematology  PCP:  [name], MD [address] [place] [address] [phone]   REASON FOR VISIT:  Follow-up for cecal adenocarcinoma  PRIOR THERAPY: Laparoscopic right hemicolectomy on 09/28/2020  NGS Results: not done  CURRENT THERAPY: Oral iron tablet M/W/F  BRIEF ONCOLOGIC HISTORY:  Oncology History   No history exists.    CANCER STAGING: Cancer Staging  No matching staging information was found for the patient.  INTERVAL HISTORY:  [name], a 88 y.o. 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 Jerry Macdonald was last seen on 11/13/2021.   Today he reports feeling good. He denies changes in BM, hematochezia, and black stools. He takes an iron tablet on Monday, Wednesday, and Friday.   REVIEW OF SYSTEMS:  Review of Systems  Constitutional:  Negative for appetite change and fatigue.  Gastrointestinal:  Negative for blood in stool, constipation and diarrhea.  Genitourinary:  Positive for nocturia.   All other systems reviewed and are negative.   PAST MEDICAL/SURGICAL HISTORY:  Past Medical History:  Diagnosis Date   Abnormal prostate biopsy    Back pain    with radiculopathy   Cataracts, bilateral    Constipation    Family history of lung cancer    Family history of ovarian cancer    Family history of prostate cancer    Goiter    HOH (hard of hearing)    Hyperlipidemia    Hypertrophy of prostate    Malaise and fatigue    Obesity    Pyogenic granuloma    left cheeck   S/P colonoscopy June 2011   rectal bleeding secondary to hemorrhoids   S/P colonoscopy 2007   [facility], diverticulosis per Dr. Juel Burrow note   S/P endoscopy June 2011   Barrett's, mild gastritis and duodenitis   Seborrheic keratosis    Sleep apnea    Past Surgical History:  Procedure Laterality Date   BIOPSY  03/27/2019   Procedure: BIOPSY;  Surgeon:  Danie Binder, MD;  [facility];  Service: Endoscopy;;   BIOPSY  08/16/2020   Procedure: BIOPSY;  Surgeon: Eloise Harman, DO;  [facility];  Service: Endoscopy;;   COLONOSCOPY  JUN 2011   PAN-COLONIC DIVERTICULOSIS   [place]  2007   [place] [facility]   COLONOSCOPY WITH PROPOFOL N/A 08/16/2020   Procedure: COLONOSCOPY WITH PROPOFOL;  Surgeon: Eloise Harman, DO;  [facility];  Service: Endoscopy;  Laterality: N/A;  10:45am   ESOPHAGOGASTRODUODENOSCOPY  06/11/2011   Procedure: ESOPHAGOGASTRODUODENOSCOPY (EGD);  Surgeon: Dorothyann Peng, MD;  [facility];  Service: Endoscopy;  Laterality: N/A;  11:00   ESOPHAGOGASTRODUODENOSCOPY N/A 05/21/2014   Dr. Fields:Barrett's esophagus/ Medium sized hiatal hernia. path with indefinite dysplasia.    ESOPHAGOGASTRODUODENOSCOPY N/A 12/20/2014   Dr. Clayburn Pert gastric polyp/large HH/6 cm segment of Barrett's esophagus, path with hyperplastic gastric polyp, no dysplasia. Surveillance April 2017 due    ESOPHAGOGASTRODUODENOSCOPY N/A 01/20/2016   Procedure: ESOPHAGOGASTRODUODENOSCOPY (EGD);  Surgeon: Danie Binder, MD;  [facility];  Service: Endoscopy;  Laterality: N/A;  1230pm   ESOPHAGOGASTRODUODENOSCOPY N/A 03/27/2019   Procedure: ESOPHAGOGASTRODUODENOSCOPY (EGD);  Surgeon: Danie Binder, MD;  [facility];  Service: Endoscopy;  Laterality: N/A;  12:15pm   HERNIA REPAIR     LAPAROSCOPIC PARTIAL COLECTOMY Right 09/28/2020   Procedure: LAPAROSCOPIC RIGHT  HEMI COLECTOMY;  Surgeon: Virl Cagey, MD;  Location: [facility];  Service: General;  Laterality: Right;   THYROIDECTOMY N/A 01/23/2021   Procedure: TOTAL THYROIDECTOMY;  Surgeon: Virl Cagey, MD;  Location: [facility];  Service: General;  Laterality: N/A;   UMBILICAL HERNIA REPAIR     VARICOSE VEIN SURGERY      SOCIAL HISTORY:  Social History   Socioeconomic History   Marital status: Divorced    Spouse name: Not on file    Number of children: Not on file   Years of education: Not on file   Highest education level: Not on file  Occupational History   Not on file  Tobacco Use   Smoking status: Former    Packs/day: 0.50    Years: 2.00    Total pack years: 1.00    Types: Cigarettes    Quit date: 09/10/1963    Years since quitting: 58.4   Smokeless tobacco: Never   Tobacco comments:    quit in 1965  Vaping Use   Vaping Use: Former  Substance and Sexual Activity   Alcohol use: Not Currently   Drug use: No   Sexual activity: Not Currently  Other Topics Concern   Not on file  Social History Narrative   Not on file   Social Determinants of Health   Financial Resource Strain: Not on file  Food Insecurity: Not on file  Transportation Needs: No Transportation Needs (09/06/2020)   PRAPARE - Hydrologist (Medical): No    Lack of Transportation (Non-Medical): No  Physical Activity: Inactive (09/06/2020)   Exercise Vital Sign    Days of Exercise per Week: 0 days    Minutes of Exercise per Session: 0 min  Stress: Not on file  Social Connections: Not on file  Intimate Partner Violence: Not At Risk (09/06/2020)   Humiliation, Afraid, Rape, and Kick questionnaire    Fear of Current or Ex-Partner: No    Emotionally Abused: No    Physically Abused: No    Sexually Abused: No    FAMILY HISTORY:  Family History  Problem Relation Age of Onset   Prostate cancer Father 73       metastatic   Lung cancer Brother 75       heavy smoker   Prostate cancer Paternal Uncle        metastatic   Alzheimer's disease Maternal Grandmother    Prostate cancer Maternal Grandfather 82       metastatic   Prostate cancer Paternal Grandfather 76       metastatic   Ovarian cancer Paternal Aunt 38   Prostate cancer Paternal Uncle        metastatic   Cancer Cousin 42       unknown type, paternal first cousin   Liver cancer Cousin        dx 67s, paternal first cousin   Colon polyps Neg Hx    Colon  cancer Neg Hx     CURRENT MEDICATIONS:  Current Outpatient Medications  Medication Sig Dispense Refill   acetaminophen (TYLENOL) 500 MG tablet Take 500 mg by mouth every 8 (eight) hours as needed for moderate pain.     calcium-vitamin D (OSCAL WITH D) 500-5 MG-MCG tablet Take 1 tablet by mouth in the morning and at bedtime. Lunch and dinner 180 tablet 1   Cholecalciferol (VITAMIN D3) 25 MCG (1000 UT) CAPS Take 1 capsule by mouth 3 (three) times daily.  ferrous sulfate 324 MG TBEC Take 324 mg by mouth every other day.     finasteride (PROSCAR) 5 MG tablet TAKE 1 TABLET DAILY 90 tablet 3   levothyroxine (SYNTHROID) 100 MCG tablet Take 1 tablet (100 mcg total) by mouth daily before breakfast. 90 tablet 1   magnesium oxide (MAG-OX) 400 MG tablet Take 400 mg by mouth 2 (two) times daily.     Multiple Vitamins-Minerals (MULTIVITAMINS THER. W/MINERALS) TABS Take 1 tablet by mouth daily.     ondansetron (ZOFRAN-ODT) 4 MG disintegrating tablet Take 1 tablet (4 mg total) by mouth every 6 (six) hours as needed for nausea. 20 tablet 0   pantoprazole (PROTONIX) 40 MG tablet Take one tablet once daily before a meal, can take second tablet before evening if needed. 180 tablet 3   tamsulosin (FLOMAX) 0.4 MG CAPS capsule Take 1 capsule (0.4 mg total) by mouth daily. 90 capsule 3   traMADol (ULTRAM) 50 MG tablet Take 1 tablet (50 mg total) by mouth every 6 (six) hours as needed for moderate pain or severe pain. 30 tablet 0   No current facility-administered medications for this visit.    ALLERGIES:  Allergies  Allergen Reactions   Penicillins     Swelling of hands and feet - severe Did it involve swelling of the face/tongue/throat, SOB, or low BP? No Did it involve sudden or severe rash/hives, skin peeling, or any reaction on the inside of your mouth or nose? No Did you need to seek medical attention at a hospital or doctor's office? No When did it last happen?      1950s If all above answers are  "NO", may proceed with cephalosporin use.    Penicillin G Other (See Comments)    PHYSICAL EXAM:  Performance status (ECOG): 0 - Asymptomatic  There were no vitals filed for this visit. Wt Readings from Last 3 Encounters:  12/11/21 202 lb 12.8 oz (92 kg)  11/28/21 190 lb (86.2 kg)  11/13/21 190 lb (86.2 kg)   Physical Exam Vitals reviewed.  Constitutional:      Appearance: Normal appearance.  Cardiovascular:     Rate and Rhythm: Normal rate and regular rhythm.     Pulses: Normal pulses.     Heart sounds: Normal heart sounds.  Pulmonary:     Effort: Pulmonary effort is normal.     Breath sounds: Normal breath sounds.  Abdominal:     Palpations: Abdomen is soft. There is no hepatomegaly, splenomegaly or mass.     Tenderness: There is no abdominal tenderness.  Musculoskeletal:     Right lower leg: No edema.     Left lower leg: No edema.  Neurological:     General: No focal deficit present.     Mental Status: He is alert and oriented to person, place, and time.  Psychiatric:        Mood and Affect: Mood normal.        Behavior: Behavior normal.      LABORATORY DATA:  I have reviewed the labs as listed.     Latest Ref Rng & Units 02/12/2022    9:50 AM 11/06/2021   10:39 AM 07/21/2021    9:50 AM  CBC  WBC 4.0 - 10.5 K/uL 8.9  6.3  7.3   Hemoglobin 13.0 - 17.0 g/dL 14.3  14.1  14.9   Hematocrit 39.0 - 52.0 % 42.9  41.7  43.2   Platelets 150 - 400 K/uL 183  167  191  Latest Ref Rng & Units 02/12/2022    9:50 AM 11/06/2021   10:39 AM 07/21/2021    9:50 AM  CMP  Glucose 70 - 99 mg/dL 96  97  102   BUN 8 - 23 mg/dL _0 Creatinine 0.61 - 1.24 mg/dL 0.77  0.89  0.80   Sodium 135 - 145 mmol/L 136  139  137   Potassium 3.5 - 5.1 mmol/L 4.4  4.2  4.4   Chloride 98 - 111 mmol/L 103  104  100   CO2 22 - 32 mmol/L _1 Calcium 8.9 - 10.3 mg/dL 9.1  9.0  9.2   Total Protein 6.5 - 8.1 g/dL 7.1  7.1  7.2   Total Bilirubin 0.3 - 1.2 mg/dL 1.5  1.6  1.9    Alkaline Phos 38 - 126 U/L 57  56  61   AST 15 - 41 U/L _2 ALT 0 - 44 U/L _3 DIAGNOSTIC IMAGING:  I have independently reviewed the scans and discussed with the patient. CT Abdomen Pelvis W Contrast  Result Date: 02/12/2022 CLINICAL DATA:  Follow-up colon carcinoma. Surveillance. Suspected umbilical hernia. EXAM: CT ABDOMEN AND PELVIS WITH CONTRAST TECHNIQUE: Multidetector CT imaging of the abdomen and pelvis was performed using the standard protocol following bolus administration of intravenous contrast. RADIATION DOSE REDUCTION: This exam was performed according to the departmental dose-optimization program which includes automated exposure control, adjustment of the mA and/or kV according to patient size and/or use of iterative reconstruction technique. CONTRAST:  15m OMNIPAQUE IOHEXOL 300 MG/ML  SOLN COMPARISON:  07/21/2021 FINDINGS: Lower Chest: No acute findings. Bibasilar calcified pleural plaque again seen, consistent with asbestos related pleural disease. Hepatobiliary: Small left hepatic lobe cyst noted. No suspicious liver masses identified. Gallbladder is unremarkable. No evidence of biliary ductal dilatation. Pancreas:  No mass or inflammatory changes. Spleen: Within normal limits in size and appearance. Adrenals/Urinary Tract: No masses identified. No evidence of ureteral calculi or hydronephrosis. Stomach/Bowel: Small hiatal hernia again seen. Stable postop changes from right colectomy. Diffuse colonic diverticulosis again demonstrated, however there is no evidence of diverticulitis. No evidence of obstruction, inflammatory process or abnormal fluid collections. Vascular/Lymphatic: No pathologically enlarged lymph nodes. No acute vascular findings. Aortic atherosclerotic calcification incidentally noted. Reproductive: Stable moderately enlarged prostate gland, which indents the bladder base. Other: Stable moderate to large right inguinal hernia and small left  inguinal hernia, both containing only fat. Musculoskeletal:  No suspicious bone lesions identified. IMPRESSION: No evidence of recurrent or metastatic carcinoma within the abdomen or pelvis. Stable moderately enlarged prostate. Stable bilateral inguinal hernias, right side larger than left, both containing only fat. Colonic diverticulosis. No radiographic evidence of diverticulitis. Small hiatal hernia. Aortic Atherosclerosis (ICD10-I70.0). Electronically Signed   By: JMarlaine HindM.D.   On: 02/12/2022 17:04     ASSESSMENT:  1.  Cecal adenocarcinoma: -Colonoscopy by Dr. CAbbey Chatterson 08/16/2020 with fungating/infiltrative submucosal and ulcerated partially obstructing mass found in the cecum and at the ileocecal valve. -Biopsy consistent with adenocarcinoma, MMR preserved. -CT CAP on 08/22/2020 with cycle carcinoma with small ileocolic mesenteric lymph nodes with no evidence of distant metastatic spread.   2. Social/family history: -He is retired from [employer] He had asbestos exposure. -Maternal and paternal grandfathers died of prostate cancer. Father died of prostate cancer. 2 paternal uncles had prostate cancer.   PLAN:  1.  Stage II (PT3PN0) cecal adenocarcinoma, MMR preserved: - Denies bleeding per rectum or change in bowel habits. - Reviewed labs from 02/12/2022 with CEA 2.7.  LFTs are normal with slightly elevated total bilirubin which is stable. - Reviewed CT [facility] with contrast (02/12/2022): No evidence of recurrence or metastatic disease.  Other benign findings were discussed with the patient. - RTC 3 months for follow-up with repeat labs and tumor marker.   2. Prostate cancer: - Last PSA was 5.8 on 10/16/2021.  Continue follow-up with urology.   3. Family history: - Genetic testing was recommended due to extensive family history.  He declined.   4. Normocytic anemia: - Ferritin is 22 and hemoglobin 14.3. - Continue iron tablet Monday/Wednesday/Friday.   Orders placed this encounter:  No  orders of the defined types were placed in this encounter.    Derek Jack, MD [place] [phone]   I, Thana Ates, am acting as a scribe for Dr. Derek Jack.  I, Derek Jack MD, have reviewed the above documentation for accuracy and completeness, and I agree with the above.

## 2022-02-19 NOTE — Patient Instructions (Addendum)
[place] at [facility] Discharge Instructions  You were seen and examined today by Dr. Delton Coombes.  Dr. Delton Coombes discussed your most recent lab work and CT scan revealed that everything looks good. Continue taking Iron pill three times a week.   Follow-up as scheduled in 3 months.    Thank you for choosing [facility] to provide your oncology and hematology care.  To afford each patient quality time with our provider, please arrive at least 15 minutes before your scheduled appointment time.   If you have a lab appointment with the [facility] please come in thru the Main Entrance and check in at the main information desk.  You need to re-schedule your appointment should you arrive 10 or more minutes late.  We strive to give you quality time with our providers, and arriving late affects you and other patients whose appointments are after yours.  Also, if you no show three or more times for appointments you may be dismissed from the clinic at the providers discretion.     Again, thank you for choosing [facility].  Our hope is that these requests will decrease the amount of time that you wait before being seen by our physicians.       _____________________________________________________________  Should you have questions after your visit to [facility], please contact our office at [phone] and follow the prompts.  Our office hours are 8:00 a.m. and 4:30 p.m. Monday - Friday.  Please note that voicemails left after 4:00 p.m. may not be returned until the following business day.  We are closed weekends and major holidays.  You do have access to a nurse 24-7, just call the main number to the clinic [phone] and do not press any options, hold on the line and a nurse will answer the phone.    For prescription refill requests, have your pharmacy contact our office and allow 72 hours.    Due to  Covid, you will need to wear a mask upon entering the hospital. If you do not have a mask, a mask will be given to you at the Main Entrance upon arrival. For doctor visits, patients may have 1 support person age 51 or older with them. For treatment visits, patients can not have anyone with them due to social distancing guidelines and our immunocompromised population.

## 2022-03-12 DIAGNOSIS — R7301 Impaired fasting glucose: Secondary | ICD-10-CM | POA: Diagnosis not present

## 2022-03-12 DIAGNOSIS — D649 Anemia, unspecified: Secondary | ICD-10-CM | POA: Diagnosis not present

## 2022-03-12 DIAGNOSIS — E039 Hypothyroidism, unspecified: Secondary | ICD-10-CM | POA: Diagnosis not present

## 2022-03-19 DIAGNOSIS — D649 Anemia, unspecified: Secondary | ICD-10-CM | POA: Diagnosis not present

## 2022-03-19 DIAGNOSIS — R7301 Impaired fasting glucose: Secondary | ICD-10-CM | POA: Diagnosis not present

## 2022-03-19 DIAGNOSIS — E663 Overweight: Secondary | ICD-10-CM | POA: Diagnosis not present

## 2022-03-19 DIAGNOSIS — E039 Hypothyroidism, unspecified: Secondary | ICD-10-CM | POA: Diagnosis not present

## 2022-03-19 DIAGNOSIS — Z6828 Body mass index (BMI) 28.0-28.9, adult: Secondary | ICD-10-CM | POA: Diagnosis not present

## 2022-03-19 DIAGNOSIS — C61 Malignant neoplasm of prostate: Secondary | ICD-10-CM | POA: Diagnosis not present

## 2022-03-19 DIAGNOSIS — M25562 Pain in left knee: Secondary | ICD-10-CM | POA: Diagnosis not present

## 2022-04-23 ENCOUNTER — Other Ambulatory Visit: Payer: Medicare Other

## 2022-04-23 DIAGNOSIS — R972 Elevated prostate specific antigen [PSA]: Secondary | ICD-10-CM | POA: Diagnosis not present

## 2022-04-23 DIAGNOSIS — C61 Malignant neoplasm of prostate: Secondary | ICD-10-CM | POA: Diagnosis not present

## 2022-04-24 LAB — PSA: Prostate Specific Ag, Serum: 5.5 ng/mL — ABNORMAL HIGH (ref 0.0–4.0)

## 2022-04-26 ENCOUNTER — Ambulatory Visit (INDEPENDENT_AMBULATORY_CARE_PROVIDER_SITE_OTHER): Payer: Medicare Other | Admitting: Urology

## 2022-04-26 ENCOUNTER — Encounter: Payer: Self-pay | Admitting: Urology

## 2022-04-26 VITALS — BP 126/76 | HR 80

## 2022-04-26 DIAGNOSIS — C61 Malignant neoplasm of prostate: Secondary | ICD-10-CM

## 2022-04-26 DIAGNOSIS — N401 Enlarged prostate with lower urinary tract symptoms: Secondary | ICD-10-CM | POA: Diagnosis not present

## 2022-04-26 DIAGNOSIS — N138 Other obstructive and reflux uropathy: Secondary | ICD-10-CM

## 2022-04-26 DIAGNOSIS — R351 Nocturia: Secondary | ICD-10-CM | POA: Diagnosis not present

## 2022-04-26 DIAGNOSIS — R972 Elevated prostate specific antigen [PSA]: Secondary | ICD-10-CM

## 2022-04-26 NOTE — Progress Notes (Signed)
Subjective:  1. Prostate cancer ([place])   2. Elevated prostate specific antigen (PSA)   3. BPH with urinary obstruction   4. Nocturia      Jerry Macdonald is a 88 year-old male established patient who is here evaluation for treatment of prostate cancer.  8/24/23[name] returns today in f/u.  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  His PSA is down to 5.5. from 5.8 at his last visit.  He remains on finasteride and tamsulosin.  His IPSS is 4 with nocturia x3.   A CT on 02/12/22 for colon cancer f/u showed no GU issues.   10/26/21: Jerry Macdonald returns today in f/u.  His PSA is 5.8 which is up from 5.2 in 11/22 and 4.9 8/22.   He remains on finasteride and tamsulosin.  He has moderate LUTS with an IPSS of 17 with nocturia 2-3x.   He is content with the symptoms.   He has no weight loss.   He has some back pain but no bone pain.  A CT in November 2022 showed no findings to suggests a met.   His prostate cancer was diagnosed 10/04/2014. His PSA at his time of diagnosis was 6. His  PSA has been variable with and 4.4 in 5/22 and 4.9 on 04/06/21.  It was 3.5 in 7/21, 4.08 on 09/06/20 and 5.8 on 10/06/20 but he had a colon resection and a foley prior to that blood draw.  He had colon cancer with negative margins.  He had a thyroidectomy for goiter on 01/23/21.   He remains on finasteride and tamsulosin.  His IPSS is 12 with nocturia x 2.  His UA is clear.    03/11/20: Jerry Macdonald returns today in f/u. He is AS from low volume Gleason 7(3+4) disease found on surveillance biopsy in 8/17. He had a single core Gleason 6 on his initial biopsy in 2016. He has been seen by Dr. Tammi Klippel and he felt continued AS was reasonable. He remains on finasteride and his PSA is back down to 3.5 from 3.8 at his last visit. He remains on tamsulosin. He is voiding well. He has nocturia x 2. He has a good stream without hesitancy. He has less urgency except in the morning. He does have a sensation of incomplete emptying and  he can have a weak stream. His IPSS is 9. He has no associated signs or symptoms.   He was originally diagnosed in 2/16 with a single core of Gleason 6, 10% on the left mid lateral core. His PSA has risen very slowly and was 6.52 prior to a repeat biopsy in 8/17. The repeat biopsy showed a 137m prostate and there were 3 cores positive with 5% Gleason 6 in the left lateral base, 10% Gleason 7(3+4) in the left lateral apex and 20% Gleason 6 in the right medial apex.    IPSS     Row Name 04/26/22 1500         International Prostate Symptom Score   How often have you had the sensation of not emptying your bladder? Not at All     How often have you had to urinate less than every two hours? Less than 1 in 5 times     How often have you found you stopped and started again several times when you urinated? Not at All     How often have you found it difficult to postpone urination? Not at All  How often have you had a weak urinary stream? Not at All     How often have you had to strain to start urination? Not at All     How many times did you typically get up at night to urinate? 3 Times     Total IPSS Score 4       Quality of Life due to urinary symptoms   If you were to spend the rest of your life with your urinary condition just the way it is now how would you feel about that? Mixed                 ROS:  ROS:  A complete review of systems was performed.  All systems are negative except for pertinent findings as noted.   Review of Systems  Genitourinary:  Positive for frequency.  Neurological:  Positive for weakness.  All other systems reviewed and are negative.   Allergies  Allergen Reactions   Penicillins     Swelling of hands and feet - severe Did it involve swelling of the face/tongue/throat, SOB, or low BP? No Did it involve sudden or severe rash/hives, skin peeling, or any reaction on the inside of your mouth or nose? No Did you need to seek medical attention at a  hospital or doctor's office? No When did it last happen?      1950s If all above answers are "NO", may proceed with cephalosporin use.    Penicillin G Other (See Comments)    Outpatient Encounter Medications as of 04/26/2022  Medication Sig   acetaminophen (TYLENOL) 500 MG tablet Take 500 mg by mouth every 8 (eight) hours as needed for moderate pain.   calcium-vitamin D (OSCAL WITH D) 500-5 MG-MCG tablet Take 1 tablet by mouth in the morning and at bedtime. Lunch and dinner   Cholecalciferol (VITAMIN D3) 25 MCG (1000 UT) CAPS Take 1 capsule by mouth 3 (three) times daily.   ferrous sulfate 324 MG TBEC Take 324 mg by mouth every other day.   finasteride (PROSCAR) 5 MG tablet TAKE 1 TABLET DAILY   levothyroxine (SYNTHROID) 100 MCG tablet Take 1 tablet (100 mcg total) by mouth daily before breakfast.   magnesium oxide (MAG-OX) 400 MG tablet Take 400 mg by mouth 2 (two) times daily.   Multiple Vitamins-Minerals (MULTIVITAMINS THER. W/MINERALS) TABS Take 1 tablet by mouth daily.   ondansetron (ZOFRAN-ODT) 4 MG disintegrating tablet Take 1 tablet (4 mg total) by mouth every 6 (six) hours as needed for nausea.   pantoprazole (PROTONIX) 40 MG tablet Take one tablet once daily before a meal, can take second tablet before evening if needed.   tamsulosin (FLOMAX) 0.4 MG CAPS capsule Take 1 capsule (0.4 mg total) by mouth daily.   traMADol (ULTRAM) 50 MG tablet Take 1 tablet (50 mg total) by mouth every 6 (six) hours as needed for moderate pain or severe pain.   [DISCONTINUED] doxycycline (MONODOX) 100 MG capsule Take 1 capsule by mouth 2 (two) times daily. (Patient not taking: Reported on 04/26/2022)   [DISCONTINUED] ofloxacin (FLOXIN) 0.3 % OTIC solution INSTILL 10 DROPS INTO THEEAFFECTED EAR(S) ONCERDAILY FOR 7 DAYS. (Patient not taking: Reported on 04/26/2022)   No facility-administered encounter medications on file as of 04/26/2022.    Past Medical History:  Diagnosis Date   Abnormal prostate  biopsy    Back pain    with radiculopathy   Cataracts, bilateral    Constipation    Family history of lung cancer  Family history of ovarian cancer    Family history of prostate cancer    Goiter    HOH (hard of hearing)    Hyperlipidemia    Hypertrophy of prostate    Malaise and fatigue    Obesity    Pyogenic granuloma    left cheeck   S/P colonoscopy June 2011   rectal bleeding secondary to hemorrhoids   S/P colonoscopy 2007   [facility], diverticulosis per Dr. Juel Burrow note   S/P endoscopy June 2011   Barrett's, mild gastritis and duodenitis   Seborrheic keratosis    Sleep apnea     Past Surgical History:  Procedure Laterality Date   BIOPSY  03/27/2019   Procedure: BIOPSY;  Surgeon: Danie Binder, MD;  [facility];  Service: Endoscopy;;   BIOPSY  08/16/2020   Procedure: BIOPSY;  Surgeon: Eloise Harman, DO;  [facility];  Service: Endoscopy;;   COLONOSCOPY  JUN 2011   PAN-COLONIC DIVERTICULOSIS   COLONOSCOPY  2007   [place]   COLONOSCOPY WITH PROPOFOL N/A 08/16/2020   Procedure: COLONOSCOPY WITH PROPOFOL;  Surgeon: Eloise Harman, DO;  [facility];  Service: Endoscopy;  Laterality: N/A;  10:45am   ESOPHAGOGASTRODUODENOSCOPY  06/11/2011   Procedure: ESOPHAGOGASTRODUODENOSCOPY (EGD);  Surgeon: Dorothyann Peng, MD;  [facility];  Service: Endoscopy;  Laterality: N/A;  11:00   ESOPHAGOGASTRODUODENOSCOPY N/A 05/21/2014   Dr. Fields:Barrett's esophagus/ Medium sized hiatal hernia. path with indefinite dysplasia.    ESOPHAGOGASTRODUODENOSCOPY N/A 12/20/2014   Dr. Clayburn Pert gastric polyp/large HH/6 cm segment of Barrett's esophagus, path with hyperplastic gastric polyp, no dysplasia. Surveillance April 2017 due    ESOPHAGOGASTRODUODENOSCOPY N/A 01/20/2016   Procedure: ESOPHAGOGASTRODUODENOSCOPY (EGD);  Surgeon: Danie Binder, MD;  [facility];  Service: Endoscopy;  Laterality: N/A;  1230pm    ESOPHAGOGASTRODUODENOSCOPY N/A 03/27/2019   Procedure: ESOPHAGOGASTRODUODENOSCOPY (EGD);  Surgeon: Danie Binder, MD;  [facility];  Service: Endoscopy;  Laterality: N/A;  12:15pm   HERNIA REPAIR     LAPAROSCOPIC PARTIAL COLECTOMY Right 09/28/2020   Procedure: LAPAROSCOPIC RIGHT HEMI COLECTOMY;  Surgeon: Virl Cagey, MD;  Location: [facility];  Service: General;  Laterality: Right;   THYROIDECTOMY N/A 01/23/2021   Procedure: TOTAL THYROIDECTOMY;  Surgeon: Virl Cagey, MD;  Location: [facility];  Service: General;  Laterality: N/A;   UMBILICAL HERNIA REPAIR     VARICOSE VEIN SURGERY      Social History   Socioeconomic History   Marital status: Divorced    Spouse name: Not on file   Number of children: Not on file   Years of education: Not on file   Highest education level: Not on file  Occupational History   Not on file  Tobacco Use   Smoking status: Former    Packs/day: 0.50    Years: 2.00    Total pack years: 1.00    Types: Cigarettes    Quit date: 09/10/1963    Years since quitting: 58.6   Smokeless tobacco: Never   Tobacco comments:    quit in 1965  Vaping Use   Vaping Use: Former  Substance and Sexual Activity   Alcohol use: Not Currently   Drug use: No   Sexual activity: Not Currently  Other Topics Concern   Not on file  Social History Narrative   Not on file   Social Determinants of Health   Financial Resource Strain: Not on file  Food Insecurity:  Not on file  Transportation Needs: No Transportation Needs (09/06/2020)   PRAPARE - Hydrologist (Medical): No    Lack of Transportation (Non-Medical): No  Physical Activity: Inactive (09/06/2020)   Exercise Vital Sign    Days of Exercise per Week: 0 days    Minutes of Exercise per Session: 0 min  Stress: Not on file  Social Connections: Not on file  Intimate Partner Violence: Not At Risk (09/06/2020)   Humiliation, Afraid, Rape, and Kick questionnaire    Fear of Current  or Ex-Partner: No    Emotionally Abused: No    Physically Abused: No    Sexually Abused: No    Family History  Problem Relation Age of Onset   Prostate cancer Father 45       metastatic   Lung cancer Brother 17       heavy smoker   Prostate cancer Paternal Uncle        metastatic   Alzheimer's disease Maternal Grandmother    Prostate cancer Maternal Grandfather 82       metastatic   Prostate cancer Paternal Grandfather 47       metastatic   Ovarian cancer Paternal Aunt 59   Prostate cancer Paternal Uncle        metastatic   Cancer Cousin 25       unknown type, paternal first cousin   Liver cancer Cousin        dx 74s, paternal first cousin   Colon polyps Neg Hx    Colon cancer Neg Hx        Objective: Vitals:   04/26/22 1526  BP: 126/76  Pulse: 80     Physical Exam Vitals reviewed.  Constitutional:      Appearance: Normal appearance.  Lymphadenopathy:     Cervical:     Right cervical: No superficial cervical adenopathy.    Left cervical: No superficial cervical adenopathy.     Upper Body:     Right upper body: No supraclavicular or axillary adenopathy.     Left upper body: No supraclavicular or axillary adenopathy.  Neurological:     Mental Status: He is alert.     Lab Results:  No results found for this or any previous visit (from the past 24 hour(s)).    BMET No results for input(s): "NA", "K", "CL", "CO2", "GLUCOSE", "BUN", "CREATININE", "CALCIUM" in the last 72 hours. PSA Lab Results  Component Value Date   PSA1 5.5 (H) 04/23/2022   PSA1 5.8 (H) 10/16/2021   PSA1 5.2 (H) 07/17/2021     Lab Results  Component Value Date   PSA 3.5 03/04/2020   PSA Dr. [place] Pink of Urology follows 08/12/2008       Studies/Results: Narrative & Impression     CT Abdomen Pelvis W Contrast  Result Date: 02/12/2022 CLINICAL DATA:  Follow-up colon carcinoma. Surveillance. Suspected umbilical hernia. EXAM: CT ABDOMEN AND PELVIS WITH CONTRAST TECHNIQUE:  Multidetector CT imaging of the abdomen and pelvis was performed using the standard protocol following bolus administration of intravenous contrast. RADIATION DOSE REDUCTION: This exam was performed according to the departmental dose-optimization program which includes automated exposure control, adjustment of the mA and/or kV according to patient size and/or use of iterative reconstruction technique. CONTRAST:  139m OMNIPAQUE IOHEXOL 300 MG/ML  SOLN COMPARISON:  07/21/2021 FINDINGS: Lower Chest: No acute findings. Bibasilar calcified pleural plaque again seen, consistent with asbestos related pleural disease. Hepatobiliary: Small left hepatic lobe cyst noted. No suspicious  liver masses identified. Gallbladder is unremarkable. No evidence of biliary ductal dilatation. Pancreas:  No mass or inflammatory changes. Spleen: Within normal limits in size and appearance. Adrenals/Urinary Tract: No masses identified. No evidence of ureteral calculi or hydronephrosis. Stomach/Bowel: Small hiatal hernia again seen. Stable postop changes from right colectomy. Diffuse colonic diverticulosis again demonstrated, however there is no evidence of diverticulitis. No evidence of obstruction, inflammatory process or abnormal fluid collections. Vascular/Lymphatic: No pathologically enlarged lymph nodes. No acute vascular findings. Aortic atherosclerotic calcification incidentally noted. Reproductive: Stable moderately enlarged prostate gland, which indents the bladder base. Other: Stable moderate to large right inguinal hernia and small left inguinal hernia, both containing only fat. Musculoskeletal:  No suspicious bone lesions identified. IMPRESSION: No evidence of recurrent or metastatic carcinoma within the abdomen or pelvis. Stable moderately enlarged prostate. Stable bilateral inguinal hernias, right side larger than left, both containing only fat. Colonic diverticulosis. No radiographic evidence of diverticulitis. Small hiatal  hernia. Aortic Atherosclerosis (ICD10-I70.0). Electronically Signed   By: Marlaine Hind M.D.   On: 02/12/2022 17:04    Assessment & Plan: Prostate cancer on surveillance.   His PSA is back down after rising slowly on finasteride.  A CT in 6/23 showed no concerning findings.   At his advanced age, continue surveillance is most appropriate.  I will have him f/u with a PSA in 6 mo.    BPH with BOO.  He is voiding well on tamsulosin and finasteride.    No orders of the defined types were placed in this encounter.     Orders Placed This Encounter  Procedures   Urinalysis, Routine w reflex microscopic   PSA    Standing Status:   Future    Standing Expiration Date:   04/27/2023      Return in about 6 months (around 10/27/2022) for with PSA..   CC: [name], MD      Irine Seal 04/27/2022 Patient ID: Jerry Macdonald, male   DOB: 11/16/1930, 88 y.o.   MRN: [id number]

## 2022-04-27 LAB — URINALYSIS, ROUTINE W REFLEX MICROSCOPIC
Bilirubin, UA: NEGATIVE
Glucose, UA: NEGATIVE
Ketones, UA: NEGATIVE
Leukocytes,UA: NEGATIVE
Nitrite, UA: NEGATIVE
Protein,UA: NEGATIVE
RBC, UA: NEGATIVE
Specific Gravity, UA: 1.02 (ref 1.005–1.030)
Urobilinogen, Ur: 0.2 mg/dL (ref 0.2–1.0)
pH, UA: 6.5 (ref 5.0–7.5)

## 2022-05-02 ENCOUNTER — Other Ambulatory Visit: Payer: Self-pay | Admitting: Gastroenterology

## 2022-05-02 DIAGNOSIS — K2270 Barrett's esophagus without dysplasia: Secondary | ICD-10-CM

## 2022-05-02 DIAGNOSIS — Z0000 Encounter for general adult medical examination without abnormal findings: Secondary | ICD-10-CM

## 2022-05-23 ENCOUNTER — Other Ambulatory Visit: Payer: Medicare Other

## 2022-05-24 DIAGNOSIS — Z23 Encounter for immunization: Secondary | ICD-10-CM | POA: Diagnosis not present

## 2022-05-28 ENCOUNTER — Inpatient Hospital Stay: Payer: Medicare Other | Attending: Hematology

## 2022-05-28 DIAGNOSIS — Z8546 Personal history of malignant neoplasm of prostate: Secondary | ICD-10-CM | POA: Diagnosis not present

## 2022-05-28 DIAGNOSIS — D509 Iron deficiency anemia, unspecified: Secondary | ICD-10-CM | POA: Diagnosis not present

## 2022-05-28 DIAGNOSIS — Z85038 Personal history of other malignant neoplasm of large intestine: Secondary | ICD-10-CM | POA: Insufficient documentation

## 2022-05-28 DIAGNOSIS — C180 Malignant neoplasm of cecum: Secondary | ICD-10-CM

## 2022-05-28 LAB — COMPREHENSIVE METABOLIC PANEL
ALT: 21 U/L (ref 0–44)
AST: 26 U/L (ref 15–41)
Albumin: 3.9 g/dL (ref 3.5–5.0)
Alkaline Phosphatase: 59 U/L (ref 38–126)
Anion gap: 6 (ref 5–15)
BUN: 14 mg/dL (ref 8–23)
CO2: 27 mmol/L (ref 22–32)
Calcium: 8.8 mg/dL — ABNORMAL LOW (ref 8.9–10.3)
Chloride: 106 mmol/L (ref 98–111)
Creatinine, Ser: 0.81 mg/dL (ref 0.61–1.24)
GFR, Estimated: >60 mL/min (ref >60)
Glucose, Bld: 102 mg/dL — ABNORMAL HIGH (ref 70–99)
Potassium: 4.3 mmol/L (ref 3.5–5.1)
Sodium: 139 mmol/L (ref 135–145)
Total Bilirubin: 1.9 mg/dL — ABNORMAL HIGH (ref 0.3–1.2)
Total Protein: 6.7 g/dL (ref 6.5–8.1)

## 2022-05-28 LAB — CBC WITH DIFFERENTIAL/PLATELET
Abs Immature Granulocytes: 0.03 10*3/uL (ref 0.00–0.07)
Basophils Absolute: 0 10*3/uL (ref 0.0–0.1)
Basophils Relative: 1 %
Eosinophils Absolute: 0.1 10*3/uL (ref 0.0–0.5)
Eosinophils Relative: 2 %
HCT: 41.7 % (ref 39.0–52.0)
Hemoglobin: 14.3 g/dL (ref 13.0–17.0)
Immature Granulocytes: 1 %
Lymphocytes Relative: 26 %
Lymphs Abs: 1.6 10*3/uL (ref 0.7–4.0)
MCH: 34 pg (ref 26.0–34.0)
MCHC: 34.3 g/dL (ref 30.0–36.0)
MCV: 99.3 fL (ref 80.0–100.0)
Monocytes Absolute: 0.6 10*3/uL (ref 0.1–1.0)
Monocytes Relative: 10 %
Neutro Abs: 3.9 10*3/uL (ref 1.7–7.7)
Neutrophils Relative %: 60 %
Platelets: 173 10*3/uL (ref 150–400)
RBC: 4.2 MIL/uL — ABNORMAL LOW (ref 4.22–5.81)
RDW: 12.6 % (ref 11.5–15.5)
WBC: 6.3 10*3/uL (ref 4.0–10.5)
nRBC: 0 % (ref 0.0–0.2)

## 2022-05-28 LAB — IRON AND TIBC
Iron: 133 ug/dL (ref 45–182)
Saturation Ratios: 44 % — ABNORMAL HIGH (ref 17.9–39.5)
TIBC: 305 ug/dL (ref 250–450)
UIBC: 172 ug/dL

## 2022-05-28 LAB — FERRITIN: Ferritin: 30 ng/mL (ref 24–336)

## 2022-05-30 ENCOUNTER — Ambulatory Visit: Payer: Medicare Other | Admitting: Hematology

## 2022-05-30 LAB — CEA: CEA: 2.5 ng/mL (ref 0.0–4.7)

## 2022-05-31 ENCOUNTER — Encounter (INDEPENDENT_AMBULATORY_CARE_PROVIDER_SITE_OTHER): Payer: Self-pay

## 2022-05-31 DIAGNOSIS — H2181 Floppy iris syndrome: Secondary | ICD-10-CM | POA: Diagnosis not present

## 2022-05-31 DIAGNOSIS — Z23 Encounter for immunization: Secondary | ICD-10-CM | POA: Diagnosis not present

## 2022-05-31 DIAGNOSIS — H5703 Miosis: Secondary | ICD-10-CM | POA: Diagnosis not present

## 2022-05-31 DIAGNOSIS — H3581 Retinal edema: Secondary | ICD-10-CM | POA: Diagnosis not present

## 2022-05-31 DIAGNOSIS — H25813 Combined forms of age-related cataract, bilateral: Secondary | ICD-10-CM | POA: Diagnosis not present

## 2022-05-31 DIAGNOSIS — H43821 Vitreomacular adhesion, right eye: Secondary | ICD-10-CM | POA: Diagnosis not present

## 2022-05-31 DIAGNOSIS — H02834 Dermatochalasis of left upper eyelid: Secondary | ICD-10-CM | POA: Diagnosis not present

## 2022-05-31 DIAGNOSIS — H02831 Dermatochalasis of right upper eyelid: Secondary | ICD-10-CM | POA: Diagnosis not present

## 2022-05-31 DIAGNOSIS — H353111 Nonexudative age-related macular degeneration, right eye, early dry stage: Secondary | ICD-10-CM | POA: Diagnosis not present

## 2022-06-04 ENCOUNTER — Inpatient Hospital Stay: Payer: Medicare Other | Attending: Hematology | Admitting: Hematology

## 2022-06-04 ENCOUNTER — Ambulatory Visit (INDEPENDENT_AMBULATORY_CARE_PROVIDER_SITE_OTHER): Payer: Medicare Other | Admitting: Ophthalmology

## 2022-06-04 ENCOUNTER — Encounter (INDEPENDENT_AMBULATORY_CARE_PROVIDER_SITE_OTHER): Payer: Self-pay | Admitting: Ophthalmology

## 2022-06-04 ENCOUNTER — Encounter (INDEPENDENT_AMBULATORY_CARE_PROVIDER_SITE_OTHER): Payer: Medicare Other | Admitting: Ophthalmology

## 2022-06-04 VITALS — BP 131/74 | HR 68 | Temp 97.9°F | Resp 18 | Ht 71.0 in | Wt 205.9 lb

## 2022-06-04 DIAGNOSIS — Z801 Family history of malignant neoplasm of trachea, bronchus and lung: Secondary | ICD-10-CM | POA: Diagnosis not present

## 2022-06-04 DIAGNOSIS — H43821 Vitreomacular adhesion, right eye: Secondary | ICD-10-CM | POA: Diagnosis not present

## 2022-06-04 DIAGNOSIS — H353132 Nonexudative age-related macular degeneration, bilateral, intermediate dry stage: Secondary | ICD-10-CM | POA: Diagnosis not present

## 2022-06-04 DIAGNOSIS — Z8042 Family history of malignant neoplasm of prostate: Secondary | ICD-10-CM | POA: Diagnosis not present

## 2022-06-04 DIAGNOSIS — H2513 Age-related nuclear cataract, bilateral: Secondary | ICD-10-CM | POA: Diagnosis not present

## 2022-06-04 DIAGNOSIS — D509 Iron deficiency anemia, unspecified: Secondary | ICD-10-CM | POA: Insufficient documentation

## 2022-06-04 DIAGNOSIS — H353211 Exudative age-related macular degeneration, right eye, with active choroidal neovascularization: Secondary | ICD-10-CM

## 2022-06-04 DIAGNOSIS — Z8546 Personal history of malignant neoplasm of prostate: Secondary | ICD-10-CM | POA: Diagnosis not present

## 2022-06-04 DIAGNOSIS — H43822 Vitreomacular adhesion, left eye: Secondary | ICD-10-CM | POA: Diagnosis not present

## 2022-06-04 DIAGNOSIS — Z800 Family history of malignant neoplasm of digestive organs: Secondary | ICD-10-CM | POA: Insufficient documentation

## 2022-06-04 DIAGNOSIS — Z87891 Personal history of nicotine dependence: Secondary | ICD-10-CM | POA: Insufficient documentation

## 2022-06-04 DIAGNOSIS — C180 Malignant neoplasm of cecum: Secondary | ICD-10-CM | POA: Diagnosis not present

## 2022-06-04 DIAGNOSIS — E890 Postprocedural hypothyroidism: Secondary | ICD-10-CM | POA: Diagnosis not present

## 2022-06-04 DIAGNOSIS — Z85038 Personal history of other malignant neoplasm of large intestine: Secondary | ICD-10-CM | POA: Insufficient documentation

## 2022-06-04 DIAGNOSIS — Z8041 Family history of malignant neoplasm of ovary: Secondary | ICD-10-CM | POA: Insufficient documentation

## 2022-06-04 MED ORDER — BEVACIZUMAB CHEMO INJECTION 1.25MG/0.05ML SYRINGE FOR KALEIDOSCOPE
2.5000 mg | INTRAVITREAL | Status: AC | PRN
  Administered 2022-06-04: 2.5 mg via INTRAVITREAL

## 2022-06-04 NOTE — Patient Instructions (Addendum)
[place] at [facility] Discharge Instructions   You were seen and examined today by Dr. Delton Coombes.  He reviewed the results of your lab work which are normal/stable.   We will see you back in 3 months. We will repeat your lab work and a CT scan prior to your next visit.    Thank you for choosing [place] at [facility] to provide your oncology and hematology care.  To afford each patient quality time with our provider, please arrive at least 15 minutes before your scheduled appointment time.   If you have a lab appointment with the [facility] please come in thru the Main Entrance and check in at the main information desk.  You need to re-schedule your appointment should you arrive 10 or more minutes late.  We strive to give you quality time with our providers, and arriving late affects you and other patients whose appointments are after yours.  Also, if you no show three or more times for appointments you may be dismissed from the clinic at the providers discretion.     Again, thank you for choosing [facility].  Our hope is that these requests will decrease the amount of time that you wait before being seen by our physicians.       _____________________________________________________________  Should you have questions after your visit to [facility], please contact our office at [phone] and follow the prompts.  Our office hours are 8:00 a.m. and 4:30 p.m. Monday - Friday.  Please note that voicemails left after 4:00 p.m. may not be returned until the following business day.  We are closed weekends and major holidays.  You do have access to a nurse 24-7, just call the main number to the clinic [phone] and do not press any options, hold on the line and a nurse will answer the phone.    For prescription refill requests, have your pharmacy contact our office and allow 72 hours.    Due to Covid, you will  need to wear a mask upon entering the hospital. If you do not have a mask, a mask will be given to you at the Main Entrance upon arrival. For doctor visits, patients may have 1 support person age 46 or older with them. For treatment visits, patients can not have anyone with them due to social distancing guidelines and our immunocompromised population.

## 2022-06-04 NOTE — Assessment & Plan Note (Signed)
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

## 2022-06-04 NOTE — Assessment & Plan Note (Signed)
Wet AMD OD.  We will need to recommend treatment with antivegF.  Commence promptly.

## 2022-06-04 NOTE — Assessment & Plan Note (Signed)
OU with visually significant cataract.  It would hamper the ability to perform vitrectomy in the left eye should be required to release the foveal traction which is disturbing the photoreceptor of the left eye.  However the pressing need is for treatment of the wet macular degeneration of right eye

## 2022-06-04 NOTE — Progress Notes (Signed)
[place], [place] [address]   CLINIC:  Medical Oncology/Hematology  PCP:  [name], MD [address] [place] [address] [phone]   REASON FOR VISIT:  Follow-up for cecal adenocarcinoma  PRIOR THERAPY: Laparoscopic right hemicolectomy on 09/28/2020  NGS Results: not done  CURRENT THERAPY: Oral iron tablet M/W/F  BRIEF ONCOLOGIC HISTORY:  Oncology History   No history exists.    CANCER STAGING:  Cancer Staging  No matching staging information was found for the patient.  INTERVAL HISTORY:  [name], a 88 y.o. male, seen for follow-up of cecal adenocarcinoma and iron deficiency state.  He is taking iron tablet Monday, Wednesday and Friday.  Denies any bleeding per rectum or melena.  No change in bowel habits.  REVIEW OF SYSTEMS:  Review of Systems  Constitutional:  Negative for appetite change and fatigue.  Gastrointestinal:  Negative for blood in stool, constipation and diarrhea.  All other systems reviewed and are negative.   PAST MEDICAL/SURGICAL HISTORY:  Past Medical History:  Diagnosis Date   Abnormal prostate biopsy    Back pain    with radiculopathy   Cataracts, bilateral    Constipation    Family history of lung cancer    Family history of ovarian cancer    Family history of prostate cancer    Goiter    HOH (hard of hearing)    Hyperlipidemia    Hypertrophy of prostate    Malaise and fatigue    Obesity    Pyogenic granuloma    left cheeck   S/P colonoscopy June 2011   rectal bleeding secondary to hemorrhoids   S/P colonoscopy 2007   [facility], diverticulosis per Dr. Juel Burrow note   S/P endoscopy June 2011   Barrett's, mild gastritis and duodenitis   Seborrheic keratosis    Sleep apnea    Past Surgical History:  Procedure Laterality Date   BIOPSY  03/27/2019   Procedure: BIOPSY;  Surgeon: Danie Binder, MD;  [facility];  Service: Endoscopy;;   BIOPSY  08/16/2020    Procedure: BIOPSY;  Surgeon: Eloise Harman, DO;  [facility];  Service: Endoscopy;;   COLONOSCOPY  JUN 2011   PAN-COLONIC DIVERTICULOSIS   COL[place]OSCOPY  2007   [facility]   COLONOSCOPY WITH PROPOFOL N/A 08/16/2020   Procedure: COLONOSCOPY WITH PROPOFOL;  Surgeon: Eloise Harman, DO;  [facility];  Service: Endoscopy;  Laterality: N/A;  10:45am   ESOPHAGOGASTRODUODENOSCOPY  06/11/2011   Procedure: ESOPHAGOGASTRODUODENOSCOPY (EGD);  Surgeon: Dorothyann Peng, MD;  [facility];  Service: Endoscopy;  Laterality: N/A;  11:00   ESOPHAGOGASTRODUODENOSCOPY N/A 05/21/2014   Dr. Fields:Barrett's esophagus/ Medium sized hiatal hernia. path with indefinite dysplasia.    ESOPHAGOGASTRODUODENOSCOPY N/A 12/20/2014   Dr. [place] Pert gastric polyp/large HH/6 cm segment of Barrett's esophagus, path with hyperplastic gastric polyp, no dysplasia. Surveillance April 2017 due    ESOPHAGOGASTRODUODENOSCOPY N/A 01/20/2016   Procedure: ESOPHAGOGASTRODUODENOSCOPY (EGD);  Surgeon: Danie Binder, MD;  [facility];  Service: Endoscopy;  Laterality: N/A;  1230pm   ESOPHAGOGASTRODUODENOSCOPY N/A 03/27/2019   Procedure: ESOPHAGOGASTRODUODENOSCOPY (EGD);  Surgeon: Danie Binder, MD;  [facility];  Service: Endoscopy;  Laterality: N/A;  12:15pm   HERNIA REPAIR     LAPAROSCOPIC PARTIAL COLECTOMY Right 09/28/2020   Procedure: LAPAROSCOPIC RIGHT HEMI COLECTOMY;  Surgeon: Virl Cagey, MD;  Location: [facility];  Service: General;  Laterality: Right;   THYROIDECTOMY N/A 01/23/2021   Procedure: TOTAL THYROIDECTOMY;  Surgeon: Virl Cagey, MD;  Location: [facility];  Service: General;  Laterality: N/A;   UMBILICAL HERNIA REPAIR     VARICOSE VEIN SURGERY      SOCIAL HISTORY:  Social History   Socioeconomic History   Marital status: Divorced    Spouse name: Not on file   Number of children: Not on file   Years of education: Not on file   Highest education  level: Not on file  Occupational History   Not on file  Tobacco Use   Smoking status: Former    Packs/day: 0.50    Years: 2.00    Total pack years: 1.00    Types: Cigarettes    Quit date: 09/10/1963    Years since quitting: 58.7   Smokeless tobacco: Never   Tobacco comments:    quit in 1965  Vaping Use   Vaping Use: Former  Substance and Sexual Activity   Alcohol use: Not Currently   Drug use: No   Sexual activity: Not Currently  Other Topics Concern   Not on file  Social History Narrative   Not on file   Social Determinants of Health   Financial Resource Strain: Not on file  Food Insecurity: Not on file  Transportation Needs: No Transportation Needs (09/06/2020)   PRAPARE - Hydrologist (Medical): No    Lack of Transportation (Non-Medical): No  Physical Activity: Inactive (09/06/2020)   Exercise Vital Sign    Days of Exercise per Week: 0 days    Minutes of Exercise per Session: 0 min  Stress: Not on file  Social Connections: Not on file  Intimate Partner Violence: Not At Risk (09/06/2020)   Humiliation, Afraid, Rape, and Kick questionnaire    Fear of Current or Ex-Partner: No    Emotionally Abused: No    Physically Abused: No    Sexually Abused: No    FAMILY HISTORY:  Family History  Problem Relation Age of Onset   Prostate cancer Father 16       metastatic   Lung cancer Brother 59       heavy smoker   Prostate cancer Paternal Uncle        metastatic   Alzheimer's disease Maternal Grandmother    Prostate cancer Maternal Grandfather 82       metastatic   Prostate cancer Paternal Grandfather 68       metastatic   Ovarian cancer Paternal Aunt 19   Prostate cancer Paternal Uncle        metastatic   Cancer Cousin 19       unknown type, paternal first cousin   Liver cancer Cousin        dx 41s, paternal first cousin   Colon polyps Neg Hx    Colon cancer Neg Hx     CURRENT MEDICATIONS:  Current Outpatient Medications  Medication  Sig Dispense Refill   acetaminophen (TYLENOL) 500 MG tablet Take 500 mg by mouth every 8 (eight) hours as needed for moderate pain.     calcium-vitamin D (OSCAL WITH D) 500-5 MG-MCG tablet Take 1 tablet by mouth in the morning and at bedtime. Lunch and dinner 180 tablet 1   Cholecalciferol (VITAMIN D3) 25 MCG (1000 UT) CAPS Take 1 capsule by mouth 3 (three) times daily.     ferrous sulfate 324 MG TBEC Take 324 mg by mouth every other day.  finasteride (PROSCAR) 5 MG tablet TAKE 1 TABLET DAILY 90 tablet 3   levothyroxine (SYNTHROID) 100 MCG tablet Take 1 tablet (100 mcg total) by mouth daily before breakfast. 90 tablet 1   magnesium oxide (MAG-OX) 400 MG tablet Take 400 mg by mouth 2 (two) times daily.     Multiple Vitamins-Minerals (MULTIVITAMINS THER. W/MINERALS) TABS Take 1 tablet by mouth daily.     ondansetron (ZOFRAN-ODT) 4 MG disintegrating tablet Take 1 tablet (4 mg total) by mouth every 6 (six) hours as needed for nausea. 20 tablet 0   pantoprazole (PROTONIX) 40 MG tablet TAKE 1 TABLET ONCE A DAY BEFORE A MEAL, MAY TAKE SECOND TABLET BEFORE EVENING MEAL IF NEEDED 180 tablet 3   tamsulosin (FLOMAX) 0.4 MG CAPS capsule Take 1 capsule (0.4 mg total) by mouth daily. 90 capsule 3   traMADol (ULTRAM) 50 MG tablet Take 1 tablet (50 mg total) by mouth every 6 (six) hours as needed for moderate pain or severe pain. 30 tablet 0   No current facility-administered medications for this visit.    ALLERGIES:  Allergies  Allergen Reactions   Penicillins     Swelling of hands and feet - severe Did it involve swelling of the face/tongue/throat, SOB, or low BP? No Did it involve sudden or severe rash/hives, skin peeling, or any reaction on the inside of your mouth or nose? No Did you need to seek medical attention at a hospital or doctor's office? No When did it last happen?      1950s If all above answers are "NO", may proceed with cephalosporin use.    Penicillin G Other (See Comments)     PHYSICAL EXAM:  Performance status (ECOG): 0 - Asymptomatic  Vitals:   06/04/22 1000  BP: 131/74  Pulse: 68  Resp: 18  Temp: 97.9 F (36.6 C)  SpO2: 96%   Wt Readings from Last 3 Encounters:  06/04/22 205 lb 14.4 oz (93.4 kg)  12/11/21 202 lb 12.8 oz (92 kg)  11/28/21 190 lb (86.2 kg)   Physical Exam Vitals reviewed.  Constitutional:      Appearance: Normal appearance.  Cardiovascular:     Rate and Rhythm: Normal rate and regular rhythm.     Pulses: Normal pulses.     Heart sounds: Normal heart sounds.  Pulmonary:     Effort: Pulmonary effort is normal.     Breath sounds: Normal breath sounds.  Abdominal:     Palpations: Abdomen is soft. There is no hepatomegaly, splenomegaly or mass.     Tenderness: There is no abdominal tenderness.  Musculoskeletal:     Right lower leg: No edema.     Left lower leg: No edema.  Neurological:     General: No focal deficit present.     Mental Status: He is alert and oriented to person, place, and time.  Psychiatric:        Mood and Affect: Mood normal.        Behavior: Behavior normal.      LABORATORY DATA:  I have reviewed the labs as listed.     Latest Ref Rng & Units 05/28/2022   11:11 AM 02/12/2022    9:50 AM 11/06/2021   10:39 AM  CBC  WBC 4.0 - 10.5 K/uL 6.3  8.9  6.3   Hemoglobin 13.0 - 17.0 g/dL 14.3  14.3  14.1   Hematocrit 39.0 - 52.0 % 41.7  42.9  41.7   Platelets 150 - 400 K/uL 173  183  167       Latest Ref Rng & Units 05/28/2022   11:11 AM 02/12/2022    9:50 AM 11/06/2021   10:39 AM  CMP  Glucose 70 - 99 mg/dL 102  96  97   BUN 8 - 23 mg/dL 14  22  21    Creatinine 0.61 - 1.24 mg/dL 0.81  0.77  0.89   Sodium 135 - 145 mmol/L 139  136  139   Potassium 3.5 - 5.1 mmol/L 4.3  4.4  4.2   Chloride 98 - 111 mmol/L 106  103  104   CO2 22 - 32 mmol/L 27  29  29    Calcium 8.9 - 10.3 mg/dL 8.8  9.1  9.0   Total Protein 6.5 - 8.1 g/dL 6.7  7.1  7.1   Total Bilirubin 0.3 - 1.2 mg/dL 1.9  1.5  1.6   Alkaline Phos 38  - 126 U/L 59  57  56   AST 15 - 41 U/L 26  21  20    ALT 0 - 44 U/L 21  18  18      DIAGNOSTIC IMAGING:  I have independently reviewed the scans and discussed with the patient. Color Fundus Photography Optos - OU - Both Eyes  Result Date: 06/04/2022 Right Eye Progression has no prior data. Disc findings include normal observations. Vessels : normal observations. Periphery : normal observations. Left Eye Progression has no prior data. Disc findings include normal observations. Macula : drusen. Vessels : normal observations. Periphery : normal observations. Notes Some media opacity from vitreous debris from vitreous floaters and cataract  OCT, Retina - OU - Both Eyes  Result Date: 06/04/2022 Right Eye Quality was good. Scan locations included subfoveal. Central Foveal Thickness: 288. Progression has no prior data. Findings include subretinal fluid, vitreous traction, vitreomacular adhesion . Left Eye Quality was good. Scan locations included subfoveal. Central Foveal Thickness: 286. Progression has no prior data. Findings include abnormal foveal contour, vitreous traction, vitreomacular adhesion . Notes OD with inner foveal macular tractional change.  Distorting the other fovea.  Most concerning is an area of subretinal fluid nasal to the FAZ outside the region of vitreomacular adhesion nasal to FAZ. OS with also withModest amount of vitreomacular adhesion and traction in her fovea.  And centrally of drusenoid change in the fovea suggesting significant foveal macular adhesion and traction    ASSESSMENT:  1.  Cecal adenocarcinoma: -Colonoscopy by Dr. Abbey Chatters on 08/16/2020 with fungating/infiltrative submucosal and ulcerated partially obstructing mass found in the cecum and at the ileocecal valve. -Biopsy consistent with adenocarcinoma, MMR preserved. -CT CAP on 08/22/2020 with cycle carcinoma with small ileocolic mesenteric lymph nodes with no evidence of distant metastatic spread.   2. Social/family  history: -He is retired from [employer]. He had asbestos exposure. -Maternal and paternal grandfathers died of prostate cancer. Father died of prostate cancer. 2 paternal uncles had prostate cancer.   PLAN:  1.  Stage II (PT3PN0) cecal adenocarcinoma, MMR preserved: - Last CTAP on 02/12/2022 was negative for recurrence or metastatic disease. - Denies any change in bowel habits or bleeding per rectum. - Reviewed labs from 05/27/2022 which showed normal LFTs and CBC.  Total bilirubin was mildly elevated at 1.9 and stable.  CEA was 2.5. - Recommend follow-up in 3 months with repeat labs and CTAP.   2. Prostate cancer: - Last PSA was 5.5 on 04/23/2022.  Continue follow-up with Dr. Jeffie Pollock.   3. Family history: - Genetic testing was recommended due to  extensive family history which she declined.   4. Normocytic anemia: - Ferritin is 30, improved from 22.  Hemoglobin is stable at 14.3. - Continue iron tablet Monday, Wednesday and Friday.   Orders placed this encounter:  Orders Placed This Encounter  Procedures   CT Abdomen Pelvis W Contrast   CBC with Differential   Comprehensive metabolic panel   CEA   Iron and TIBC ([facility] ONLY)   Ferritin      Derek Jack, MD [place] [phone]   I, Thana Ates, am acting as a scribe for Dr. Derek Jack.  I, Derek Jack MD, have reviewed the above documentation for accuracy and completeness, and I agree with the above.

## 2022-06-04 NOTE — Assessment & Plan Note (Signed)

## 2022-06-04 NOTE — Assessment & Plan Note (Addendum)
Vitreal foveal traction but none in the region of fluid nasal to FAZ.  Likely minor at this time

## 2022-06-04 NOTE — Progress Notes (Signed)
06/04/2022     CHIEF COMPLAINT Patient presents for  Chief Complaint  Patient presents with   Retina Evaluation      HISTORY OF PRESENT ILLNESS: Jerry Macdonald is a 88 y.o. male who presents to the clinic today for:   HPI     Retina Evaluation           Laterality: right eye   Associated Symptoms: Negative for Flashes, Floaters, Distortion, Blind Spot, Pain, Redness, Photophobia, Glare, Trauma, Scalp Tenderness, Jaw Claudication, Shoulder/Hip pain, Fever, Weight Loss and Fatigue         Comments   NP- SUBRETINAL FLUID OD- REF BY R. GROAT. Pt sated, "I see fine, I don't have any concerns for my vision. I can see out of my right eye than left eye. I like to read and after I read I would get these headaches."       Last edited by Silvestre Moment on 06/04/2022  1:55 PM.      Referring physician: Clent Jacks, MD [address] [place],  [place] [address]  HISTORICAL INFORMATION:   Selected notes from the [place]: No current outpatient medications on file. (Ophthalmic Drugs)   No current facility-administered medications for this visit. (Ophthalmic Drugs)   Current Outpatient Medications (Other)  Medication Sig   acetaminophen (TYLENOL) 500 MG tablet Take 500 mg by mouth every 8 (eight) hours as needed for moderate pain.   calcium-vitamin D (OSCAL WITH D) 500-5 MG-MCG tablet Take 1 tablet by mouth in the morning and at bedtime. Lunch and dinner   Cholecalciferol (VITAMIN D3) 25 MCG (1000 UT) CAPS Take 1 capsule by mouth 3 (three) times daily.   ferrous sulfate 324 MG TBEC Take 324 mg by mouth every other day.   finasteride (PROSCAR) 5 MG tablet TAKE 1 TABLET DAILY   levothyroxine (SYNTHROID) 100 MCG tablet Take 1 tablet (100 mcg total) by mouth daily before breakfast.   magnesium oxide (MAG-OX) 400 MG tablet Take 400 mg by mouth 2 (two) times daily.   Multiple Vitamins-Minerals (MULTIVITAMINS THER. W/MINERALS) TABS Take 1  tablet by mouth daily.   ondansetron (ZOFRAN-ODT) 4 MG disintegrating tablet Take 1 tablet (4 mg total) by mouth every 6 (six) hours as needed for nausea.   pantoprazole (PROTONIX) 40 MG tablet TAKE 1 TABLET ONCE A DAY BEFORE A MEAL, MAY TAKE SECOND TABLET BEFORE EVENING MEAL IF NEEDED   tamsulosin (FLOMAX) 0.4 MG CAPS capsule Take 1 capsule (0.4 mg total) by mouth daily.   traMADol (ULTRAM) 50 MG tablet Take 1 tablet (50 mg total) by mouth every 6 (six) hours as needed for moderate pain or severe pain.   No current facility-administered medications for this visit. (Other)      REVIEW OF SYSTEMS: ROS   Negative for: Constitutional, Gastrointestinal, Neurological, Skin, Genitourinary, Musculoskeletal, HENT, Endocrine, Cardiovascular, Eyes, Respiratory, Psychiatric, Allergic/Imm, Heme/Lymph Last edited by Silvestre Moment on 06/04/2022  1:55 PM.       ALLERGIES Allergies  Allergen Reactions   Penicillins     Swelling of hands and feet - severe Did it involve swelling of the face/tongue/throat, SOB, or low BP? No Did it involve sudden or severe rash/hives, skin peeling, or any reaction on the inside of your mouth or nose? No Did you need to seek medical attention at a hospital or doctor's office? No When did it last happen?      1950s If  all above answers are "NO", may proceed with cephalosporin use.    Penicillin G Other (See Comments)    PAST MEDICAL HISTORY Past Medical History:  Diagnosis Date   Abnormal prostate biopsy    Back pain    with radiculopathy   Cataracts, bilateral    Constipation    Family history of lung cancer    Family history of ovarian cancer    Family history of prostate cancer    Goiter    HOH (hard of hearing)    Hyperlipidemia    Hypertrophy of prostate    Malaise and fatigue    Obesity    Pyogenic granuloma    left cheeck   S/P colonoscopy June 2011   rectal bleeding secondary to hemorrhoids   S/P colonoscopy 2007   [facility], diverticulosis per  Dr. Juel Burrow note   S/P endoscopy June 2011   Barrett's, mild gastritis and duodenitis   Seborrheic keratosis    Sleep apnea    Past Surgical History:  Procedure Laterality Date   BIOPSY  03/27/2019   Procedure: BIOPSY;  Surgeon: Danie Binder, MD;  [facility];  Service: Endoscopy;;   BIOPSY  08/16/2020   Procedure: BIOPSY;  Surgeon: Eloise Harman, DO;  [facility];  Service: Endoscopy;;   COLONOSCOPY  JUN 2011   PAN-COLONIC DIVERTICULOSIS   COL[facility]COPY  2007   [facility]   COLONOSCOPY WITH PROPOFOL N/A 08/16/2020   Procedure: COLONOSCOPY WITH PROPOFOL;  Surgeon: Eloise Harman, DO;  [facility];  Service: Endoscopy;  Laterality: N/A;  10:45am   ESOPHAGOGASTRODUODENOSCOPY  06/11/2011   Procedure: ESOPHAGOGASTRODUODENOSCOPY (EGD);  Surgeon: Dorothyann Peng, MD;  [facility];  Service: Endoscopy;  Laterality: N/A;  11:00   ESOPHAGOGASTRODUODENOSCOPY N/A 05/21/2014   Dr. Fields:Barrett's esophagus/ Medium sized hiatal hernia. path with indefinite dysplasia.    ESOPHAGOGASTRODUODENOSCOPY N/A 12/20/2014   Dr. Clayburn Pert gastric polyp/large HH/6 cm segment of Barrett's esophagus, path with hyperplastic gastric polyp, no dysplasia. Surveillance April 2017 due    ESOPHAGOGASTRODUODENOSCOPY N/A 01/20/2016   Procedure: ESOPHAGOGASTRODUODENOSCOPY (EGD);  Surgeon: Danie Binder, MD;  [facility];  Service: Endoscopy;  Laterality: N/A;  1230pm   ESOPHAGOGASTRODUODENOSCOPY N/A 03/27/2019   Procedure: ESOPHAGOGASTRODUODENOSCOPY (EGD);  Surgeon: Danie Binder, MD;  [facility];  Service: Endoscopy;  Laterality: N/A;  12:15pm   HERNIA REPAIR     LAPAROSCOPIC PARTIAL COLECTOMY Right 09/28/2020   Procedure: LAPAROSCOPIC RIGHT HEMI COLECTOMY;  Surgeon: Virl Cagey, MD;  Location: [facility];  Service: General;  Laterality: Right;   THYROIDECTOMY N/A 01/23/2021   Procedure: TOTAL THYROIDECTOMY;  Surgeon: Virl Cagey, MD;   Location: [facility];  Service: General;  Laterality: N/A;   UMBILICAL HERNIA REPAIR     VARICOSE VEIN SURGERY      FAMILY HISTORY Family History  Problem Relation Age of Onset   Prostate cancer Father 35       metastatic   Lung cancer Brother 25       heavy smoker   Prostate cancer Paternal Uncle        metastatic   Alzheimer's disease Maternal Grandmother    Prostate cancer Maternal Grandfather 82       metastatic   Prostate cancer Paternal Grandfather 71       metastatic   Ovarian cancer Paternal Aunt 49   Prostate cancer Paternal Uncle        metastatic   Cancer  Cousin 19       unknown type, paternal first cousin   Liver cancer Cousin        dx 61s, paternal first cousin   Colon polyps Neg Hx    Colon cancer Neg Hx     SOCIAL HISTORY Social History   Tobacco Use   Smoking status: Former    Packs/day: 0.50    Years: 2.00    Total pack years: 1.00    Types: Cigarettes    Quit date: 09/10/1963    Years since quitting: 58.7   Smokeless tobacco: Never   Tobacco comments:    quit in 1965  Vaping Use   Vaping Use: Former  Substance Use Topics   Alcohol use: Not Currently   Drug use: No         OPHTHALMIC EXAM:  Base Eye Exam     Visual Acuity (ETDRS)       Right Left   Dist cc 20/25 -2 20/30 -2   Dist ph cc  20/25 -2    Correction: Glasses         Tonometry (Tonopen, 2:01 PM)       Right Left   Pressure 20 22         Pupils       Pupils APD   Right PERRL None   Left PERRL None         Visual Fields       Left Right    Full Full         Extraocular Movement       Right Left    Full, Ortho Full, Ortho         Neuro/Psych     Oriented x3: Yes         Dilation     Both eyes: 1.0% Mydriacyl, 2.5% Phenylephrine @ 2:01 PM           Slit Lamp and Fundus Exam     Slit Lamp Exam       Right Left   Lids/Lashes Normal Normal   Conjunctiva/Sclera White and quiet White and quiet   Cornea Clear Clear   Anterior  Chamber Deep and quiet Deep and quiet   Iris Round and reactive Round and reactive   Lens 3+ Nuclear sclerosis 3+ Nuclear sclerosis   Anterior Vitreous Normal Normal         Fundus Exam       Right Left   Posterior Vitreous Normal Normal   Disc Normal Normal   C/D Ratio 0.1 0.1   Macula Soft drusen Soft drusen   Vessels Normal Normal   Periphery Normal Normal            IMAGING AND PROCEDURES  Imaging and Procedures for 06/04/22  OCT, Retina - OU - Both Eyes       Right Eye Quality was good. Scan locations included subfoveal. Central Foveal Thickness: 288. Progression has no prior data. Findings include subretinal fluid, vitreous traction, vitreomacular adhesion .   Left Eye Quality was good. Scan locations included subfoveal. Central Foveal Thickness: 286. Progression has no prior data. Findings include abnormal foveal contour, vitreous traction, vitreomacular adhesion .   Notes OD with inner foveal macular tractional change.  Distorting the other fovea.  Most concerning is an area of subretinal fluid nasal to the FAZ outside the region of vitreomacular adhesion nasal to FAZ.  OS with also withModest amount of vitreomacular adhesion and traction in her fovea.  And centrally of drusenoid change in the fovea suggesting significant foveal macular adhesion and traction     Color Fundus Photography Optos - OU - Both Eyes       Right Eye Progression has no prior data. Disc findings include normal observations. Vessels : normal observations. Periphery : normal observations.   Left Eye Progression has no prior data. Disc findings include normal observations. Macula : drusen. Vessels : normal observations. Periphery : normal observations.   Notes Some media opacity from vitreous debris from vitreous floaters and cataract     Intravitreal Injection, Pharmacologic Agent - OD - Right Eye       Time Out 06/04/2022. 3:03 PM. Confirmed correct patient, procedure, site,  and patient consented.   Anesthesia Anesthetic medications included Lidocaine 2%.   Procedure Preparation included 5% betadine to ocular surface.   Injection: 2.5 mg Bevacizumab 1.'25mg'$ /0.35m   Route: Intravitreal, Site: Right Eye   NDC: [id number] Lot:: [id number] Expiration date: 06/15/2022   Post-op Post injection exam found visual acuity of at least counting fingers. The patient tolerated the procedure well. There were no complications. The patient received written and verbal post procedure care education.              ASSESSMENT/PLAN:  Vitreomacular adhesion of right eye Vitreal foveal traction but none in the region of fluid nasal to FAZ.  Likely minor at this time  Exudative age-related macular degeneration of right eye with active choroidal neovascularization (HCC) Wet AMD OD.  We will need to recommend treatment with antivegF.  Commence promptly.  Vitreomacular adhesion of left eye Foveal macular adhesion with foveal photoreceptor changes, may need vitrectomy in the left eye and eventually however dense cataract present at this time  Intermediate stage nonexudative age-related macular degeneration of both eyes The nature of age--related macular degeneration was discussed with the patient as well as the distinction between dry and wet types. Checking an Amsler Grid daily with advice to return immediately should a distortion develop, was given to the patient. The patient 's smoking status now and in the past was determined and advice based on the AREDS study was provided regarding the consumption of antioxidant supplements. AREDS 2 vitamin formulation was recommended. Consumption of dark leafy vegetables and fresh fruits of various colors was recommended. Treatment modalities for wet macular degeneration particularly the use of intravitreal injections of anti-blood vessel growth factors was discussed with the patient. Avastin, Lucentis, and Eylea are the available options.  On occasion, therapy includes the use of photodynamic therapy and thermal laser. Stressed to the patient do not rub eyes.  Patient was advised to check Amsler Grid daily and return immediately if changes are noted. Instructions on using the grid were given to the patient. All patient questions were answered.  Nuclear sclerotic cataract of both eyes OU with visually significant cataract.  It would hamper the ability to perform vitrectomy in the left eye should be required to release the foveal traction which is disturbing the photoreceptor of the left eye.  However the pressing need is for treatment of the wet macular degeneration of right eye     ICD-10-CM   1. Exudative age-related macular degeneration of right eye with active choroidal neovascularization (HCC)  H35.3211 OCT, Retina - OU - Both Eyes    Color Fundus Photography Optos - OU - Both Eyes    Intravitreal Injection, Pharmacologic Agent - OD - Right Eye    Bevacizumab (AVASTIN) SOLN 2.5 mg    2. Vitreomacular adhesion  of right eye  H43.821 OCT, Retina - OU - Both Eyes    Color Fundus Photography Optos - OU - Both Eyes    3. Vitreomacular adhesion of left eye  H43.822 OCT, Retina - OU - Both Eyes    Color Fundus Photography Optos - OU - Both Eyes    4. Intermediate stage nonexudative age-related macular degeneration of both eyes  H35.3132     5. Nuclear sclerotic cataract of both eyes  H25.13       1.  2.  3.  Ophthalmic Meds Ordered this visit:  Meds ordered this encounter  Medications   Bevacizumab (AVASTIN) SOLN 2.5 mg       Return in about 6 weeks (around 07/16/2022) for dilate, OD, AVASTIN OCT.  There are no Patient Instructions on file for this visit.   Explained the diagnoses, plan, and follow up with the patient and they expressed understanding.  Patient expressed understanding of the importance of proper follow up care.   Clent Demark Sandee Bernath M.D. Diseases & Surgery of the Retina and Vitreous Retina &  Diabetic [place] 06/04/22     Abbreviations: M myopia (nearsighted); A astigmatism; H hyperopia (farsighted); P presbyopia; Mrx spectacle prescription;  CTL contact lenses; OD right eye; OS left eye; OU both eyes  XT exotropia; ET esotropia; PEK punctate epithelial keratitis; PEE punctate epithelial erosions; DES dry eye syndrome; MGD meibomian gland dysfunction; ATs artificial tears; PFAT's preservative free artificial tears; Raymond nuclear sclerotic cataract; PSC posterior subcapsular cataract; ERM epi-retinal membrane; PVD posterior vitreous detachment; RD retinal detachment; DM diabetes mellitus; DR diabetic retinopathy; NPDR non-proliferative diabetic retinopathy; PDR proliferative diabetic retinopathy; CSME clinically significant macular edema; DME diabetic macular edema; dbh dot blot hemorrhages; CWS cotton wool spot; POAG primary open angle glaucoma; C/D cup-to-disc ratio; HVF humphrey visual field; GVF goldmann visual field; OCT optical coherence tomography; IOP intraocular pressure; BRVO Branch retinal vein occlusion; CRVO central retinal vein occlusion; CRAO central retinal artery occlusion; BRAO branch retinal artery occlusion; RT retinal tear; SB scleral buckle; PPV pars plana vitrectomy; VH Vitreous hemorrhage; PRP panretinal laser photocoagulation; IVK intravitreal kenalog; VMT vitreomacular traction; MH Macular hole;  NVD neovascularization of the disc; NVE neovascularization elsewhere; AREDS age related eye disease study; ARMD age related macular degeneration; POAG primary open angle glaucoma; EBMD epithelial/anterior basement membrane dystrophy; ACIOL anterior chamber intraocular lens; IOL intraocular lens; PCIOL posterior chamber intraocular lens; Phaco/IOL phacoemulsification with intraocular lens placement; PRK photorefractive keratectomy; LASIK laser assisted in situ keratomileusis; HTN hypertension; DM diabetes mellitus; COPD chronic obstructive pulmonary disease   06/04/2022      CHIEF COMPLAINT Patient presents for  Chief Complaint  Patient presents with   Retina Evaluation      HISTORY OF PRESENT ILLNESS: Jerry Macdonald is a 88 y.o. male who presents to the clinic today for:   HPI     Retina Evaluation           Laterality: right eye   Associated Symptoms: Negative for Flashes, Floaters, Distortion, Blind Spot, Pain, Redness, Photophobia, Glare, Trauma, Scalp Tenderness, Jaw Claudication, Shoulder/Hip pain, Fever, Weight Loss and Fatigue         Comments   NP- SUBRETINAL FLUID OD- REF BY R. GROAT. Pt sated, "I see fine, I don't have any concerns for my vision. I can see out of my right eye than left eye. I like to read and after I read I would get these headaches."       Last edited by Gaylyn Cheers,  [place] on 06/04/2022  1:55 PM.      Referring physician: Clent Jacks, MD [address] [place],  [place] [address]  HISTORICAL INFORMATION:   Selected notes from the MEDICAL RECORD NUMBER       CURRENT MEDICATIONS: No current outpatient medications on file. (Ophthalmic Drugs)   No current facility-administered medications for this visit. (Ophthalmic Drugs)   Current Outpatient Medications (Other)  Medication Sig   acetaminophen (TYLENOL) 500 MG tablet Take 500 mg by mouth every 8 (eight) hours as needed for moderate pain.   calcium-vitamin D (OSCAL WITH D) 500-5 MG-MCG tablet Take 1 tablet by mouth in the morning and at bedtime. Lunch and dinner   Cholecalciferol (VITAMIN D3) 25 MCG (1000 UT) CAPS Take 1 capsule by mouth 3 (three) times daily.   ferrous sulfate 324 MG TBEC Take 324 mg by mouth every other day.   finasteride (PROSCAR) 5 MG tablet TAKE 1 TABLET DAILY   levothyroxine (SYNTHROID) 100 MCG tablet Take 1 tablet (100 mcg total) by mouth daily before breakfast.   magnesium oxide (MAG-OX) 400 MG tablet Take 400 mg by mouth 2 (two) times daily.   Multiple Vitamins-Minerals (MULTIVITAMINS THER. W/MINERALS) TABS Take 1 tablet by mouth daily.    ondansetron (ZOFRAN-ODT) 4 MG disintegrating tablet Take 1 tablet (4 mg total) by mouth every 6 (six) hours as needed for nausea.   pantoprazole (PROTONIX) 40 MG tablet TAKE 1 TABLET ONCE A DAY BEFORE A MEAL, MAY TAKE SECOND TABLET BEFORE EVENING MEAL IF NEEDED   tamsulosin (FLOMAX) 0.4 MG CAPS capsule Take 1 capsule (0.4 mg total) by mouth daily.   traMADol (ULTRAM) 50 MG tablet Take 1 tablet (50 mg total) by mouth every 6 (six) hours as needed for moderate pain or severe pain.   No current facility-administered medications for this visit. (Other)      REVIEW OF SYSTEMS: ROS   Negative for: Constitutional, Gastrointestinal, Neurological, Skin, Genitourinary, Musculoskeletal, HENT, Endocrine, Cardiovascular, Eyes, Respiratory, Psychiatric, Allergic/Imm, Heme/Lymph Last edited by Silvestre Moment on 06/04/2022  1:55 PM.       ALLERGIES Allergies  Allergen Reactions   Penicillins     Swelling of hands and feet - severe Did it involve swelling of the face/tongue/throat, SOB, or low BP? No Did it involve sudden or severe rash/hives, skin peeling, or any reaction on the inside of your mouth or nose? No Did you need to seek medical attention at a hospital or doctor's office? No When did it last happen?      1950s If all above answers are "NO", may proceed with cephalosporin use.    Penicillin G Other (See Comments)    PAST MEDICAL HISTORY Past Medical History:  Diagnosis Date   Abnormal prostate biopsy    Back pain    with radiculopathy   Cataracts, bilateral    Constipation    Family history of lung cancer    Family history of ovarian cancer    Family history of prostate cancer    Goiter    HOH (hard of hearing)    Hyperlipidemia    Hypertrophy of prostate    Malaise and fatigue    Obesity    Pyogenic granuloma    left cheeck   S/P colonoscopy June 2011   rectal bleeding secondary to hemorrhoids   S/P colonoscopy 2007   [facility], diverticulosis per Dr. Juel Burrow note   S/P  endoscopy June 2011   Barrett's, mild gastritis and duodenitis   Seborrheic  keratosis    Sleep apnea    Past Surgical History:  Procedure Laterality Date   BIOPSY  03/27/2019   Procedure: BIOPSY;  Surgeon: Danie Binder, MD;  [facility];  Service: Endoscopy;;   BIOPSY  08/16/2020   Procedure: BIOPSY;  Surgeon: Eloise Harman, DO;  [facility];  Service: Endoscopy;;   COLONOSCOPY  JUN 2011   PAN-COLONIC DIVERTICULOSIS   [facility]COPY  2007   [facility]   COLONOSCOPY WITH PROPOFOL N/A 08/16/2020   Procedure: COLONOSCOPY WITH PROPOFOL;  Surgeon: Eloise Harman, DO;  [facility];  Service: Endoscopy;  Laterality: N/A;  10:45am   ESOPHAGOGASTRODUODENOSCOPY  06/11/2011   Procedure: ESOPHAGOGASTRODUODENOSCOPY (EGD);  Surgeon: Dorothyann Peng, MD;  [facility];  Service: Endoscopy;  Laterality: N/A;  11:00   ESOPHAGOGASTRODUODENOSCOPY N/A 05/21/2014   Dr. Fields:Barrett's esophagus/ Medium sized hiatal hernia. path with indefinite dysplasia.    ESOPHAGOGASTRODUODENOSCOPY N/A 12/20/2014   Dr. Clayburn Pert gastric polyp/large HH/6 cm segment of Barrett's esophagus, path with hyperplastic gastric polyp, no dysplasia. Surveillance April 2017 due    ESOPHAGOGASTRODUODENOSCOPY N/A 01/20/2016   Procedure: ESOPHAGOGASTRODUODENOSCOPY (EGD);  Surgeon: Danie Binder, MD;  [facility];  Service: Endoscopy;  Laterality: N/A;  1230pm   ESOPHAGOGASTRODUODENOSCOPY N/A 03/27/2019   Procedure: ESOPHAGOGASTRODUODENOSCOPY (EGD);  Surgeon: Danie Binder, MD;  [facility];  Service: Endoscopy;  Laterality: N/A;  12:15pm   HERNIA REPAIR     LAPAROSCOPIC PARTIAL COLECTOMY Right 09/28/2020   Procedure: LAPAROSCOPIC RIGHT HEMI COLECTOMY;  Surgeon: Virl Cagey, MD;  Location: [facility];  Service: General;  Laterality: Right;   THYROIDECTOMY N/A 01/23/2021   Procedure: TOTAL THYROIDECTOMY;  Surgeon: Virl Cagey, MD;  Location: [facility];   Service: General;  Laterality: N/A;   UMBILICAL HERNIA REPAIR     VARICOSE VEIN SURGERY      FAMILY HISTORY Family History  Problem Relation Age of Onset   Prostate cancer Father 58       metastatic   Lung cancer Brother 31       heavy smoker   Prostate cancer Paternal Uncle        metastatic   Alzheimer's disease Maternal Grandmother    Prostate cancer Maternal Grandfather 82       metastatic   Prostate cancer Paternal Grandfather 44       metastatic   Ovarian cancer Paternal Aunt 49   Prostate cancer Paternal Uncle        metastatic   Cancer Cousin 84       unknown type, paternal first cousin   Liver cancer Cousin        dx 82s, paternal first cousin   Colon polyps Neg Hx    Colon cancer Neg Hx     SOCIAL HISTORY Social History   Tobacco Use   Smoking status: Former    Packs/day: 0.50    Years: 2.00    Total pack years: 1.00    Types: Cigarettes    Quit date: 09/10/1963    Years since quitting: 58.7   Smokeless tobacco: Never   Tobacco comments:    quit in 1965  Vaping Use   Vaping Use: Former  Substance Use Topics   Alcohol use: Not Currently   Drug use: No         OPHTHALMIC EXAM:  Base Eye Exam     Visual Acuity (ETDRS)       Right Left  Dist cc 20/25 -2 20/30 -2   Dist ph cc  20/25 -2    Correction: Glasses         Tonometry (Tonopen, 2:01 PM)       Right Left   Pressure 20 22         Pupils       Pupils APD   Right PERRL None   Left PERRL None         Visual Fields       Left Right    Full Full         Extraocular Movement       Right Left    Full, Ortho Full, Ortho         Neuro/Psych     Oriented x3: Yes         Dilation     Both eyes: 1.0% Mydriacyl, 2.5% Phenylephrine @ 2:01 PM           Slit Lamp and Fundus Exam     Slit Lamp Exam       Right Left   Lids/Lashes Normal Normal   Conjunctiva/Sclera White and quiet White and quiet   Cornea Clear Clear   Anterior Chamber Deep and quiet  Deep and quiet   Iris Round and reactive Round and reactive   Lens 3+ Nuclear sclerosis 3+ Nuclear sclerosis   Anterior Vitreous Normal Normal         Fundus Exam       Right Left   Posterior Vitreous Normal Normal   Disc Normal Normal   C/D Ratio 0.1 0.1   Macula Soft drusen Soft drusen   Vessels Normal Normal   Periphery Normal Normal            IMAGING AND PROCEDURES  Imaging and Procedures for 06/04/22  OCT, Retina - OU - Both Eyes       Right Eye Quality was good. Scan locations included subfoveal. Central Foveal Thickness: 288. Progression has no prior data. Findings include subretinal fluid, vitreous traction, vitreomacular adhesion .   Left Eye Quality was good. Scan locations included subfoveal. Central Foveal Thickness: 286. Progression has no prior data. Findings include abnormal foveal contour, vitreous traction, vitreomacular adhesion .   Notes OD with inner foveal macular tractional change.  Distorting the other fovea.  Most concerning is an area of subretinal fluid nasal to the FAZ outside the region of vitreomacular adhesion nasal to FAZ.  OS with also withModest amount of vitreomacular adhesion and traction in her fovea.  And centrally of drusenoid change in the fovea suggesting significant foveal macular adhesion and traction     Color Fundus Photography Optos - OU - Both Eyes       Right Eye Progression has no prior data. Disc findings include normal observations. Vessels : normal observations. Periphery : normal observations.   Left Eye Progression has no prior data. Disc findings include normal observations. Macula : drusen. Vessels : normal observations. Periphery : normal observations.   Notes Some media opacity from vitreous debris from vitreous floaters and cataract     Intravitreal Injection, Pharmacologic Agent - OD - Right Eye       Time Out 06/04/2022. 3:03 PM. Confirmed correct patient, procedure, site, and patient consented.    Anesthesia Anesthetic medications included Lidocaine 2%.   Procedure Preparation included 5% betadine to ocular surface.   Injection: 2.5 mg Bevacizumab 1.'25mg'$ /0.64m   Route: Intravitreal, Site: Right Eye   NDC: [id number] Lot:: [id number]  Expiration date: 06/15/2022   Post-op Post injection exam found visual acuity of at least counting fingers. The patient tolerated the procedure well. There were no complications. The patient received written and verbal post procedure care education.              ASSESSMENT/PLAN:  Vitreomacular adhesion of right eye Vitreal foveal traction but none in the region of fluid nasal to FAZ.  Likely minor at this time  Exudative age-related macular degeneration of right eye with active choroidal neovascularization (HCC) Wet AMD OD.  We will need to recommend treatment with antivegF.  Commence promptly.  Vitreomacular adhesion of left eye Foveal macular adhesion with foveal photoreceptor changes, may need vitrectomy in the left eye and eventually however dense cataract present at this time  Intermediate stage nonexudative age-related macular degeneration of both eyes The nature of age--related macular degeneration was discussed with the patient as well as the distinction between dry and wet types. Checking an Amsler Grid daily with advice to return immediately should a distortion develop, was given to the patient. The patient 's smoking status now and in the past was determined and advice based on the AREDS study was provided regarding the consumption of antioxidant supplements. AREDS 2 vitamin formulation was recommended. Consumption of dark leafy vegetables and fresh fruits of various colors was recommended. Treatment modalities for wet macular degeneration particularly the use of intravitreal injections of anti-blood vessel growth factors was discussed with the patient. Avastin, Lucentis, and Eylea are the available options. On occasion, therapy  includes the use of photodynamic therapy and thermal laser. Stressed to the patient do not rub eyes.  Patient was advised to check Amsler Grid daily and return immediately if changes are noted. Instructions on using the grid were given to the patient. All patient questions were answered.  Nuclear sclerotic cataract of both eyes OU with visually significant cataract.  It would hamper the ability to perform vitrectomy in the left eye should be required to release the foveal traction which is disturbing the photoreceptor of the left eye.  However the pressing need is for treatment of the wet macular degeneration of right eye     ICD-10-CM   1. Exudative age-related macular degeneration of right eye with active choroidal neovascularization (HCC)  H35.3211 OCT, Retina - OU - Both Eyes    Color Fundus Photography Optos - OU - Both Eyes    Intravitreal Injection, Pharmacologic Agent - OD - Right Eye    Bevacizumab (AVASTIN) SOLN 2.5 mg    2. Vitreomacular adhesion of right eye  H43.821 OCT, Retina - OU - Both Eyes    Color Fundus Photography Optos - OU - Both Eyes    3. Vitreomacular adhesion of left eye  H43.822 OCT, Retina - OU - Both Eyes    Color Fundus Photography Optos - OU - Both Eyes    4. Intermediate stage nonexudative age-related macular degeneration of both eyes  H35.3132     5. Nuclear sclerotic cataract of both eyes  H25.13       OD, Early subretinal fluid nasal to FAZ signifying CNVM.  Associated PED.  We will commence therapy today after risk benefits reviewed with the patient.  Follow-up next 6 to 7 weeks likely repeat injection to continue to control and keep visual acuity stable  2.  Intermediate ARMD OU.  Patient encouraged to use AREDS 2 vitamin supplements  3.  Very dense cataract OU.  I do recommend consideration of cataract traction with intraocular  lens placement while he is still ambulatory to maximize his visual acuity.  Either I can be first.  However the left eye  would be appropriate as well since there is significant vitreal macular traction inducing photoreceptor layer changes in the left eye fovea  4.  Vitreomacular traction and adhesion.  See comments pertaining to the left eye and #3 above  Ophthalmic Meds Ordered this visit:  Meds ordered this encounter  Medications   Bevacizumab (AVASTIN) SOLN 2.5 mg       Return in about 6 weeks (around 07/16/2022) for dilate, OD, AVASTIN OCT.  There are no Patient Instructions on file for this visit.   Explained the diagnoses, plan, and follow up with the patient and they expressed understanding.  Patient expressed understanding of the importance of proper follow up care.   Clent Demark Tobey Lippard M.D. Diseases & Surgery of the Retina and Vitreous [facility] [place] 06/04/22     Abbreviations: M myopia (nearsighted); A astigmatism; H hyperopia (farsighted); P presbyopia; Mrx spectacle prescription;  CTL contact lenses; OD right eye; OS left eye; OU both eyes  XT exotropia; ET esotropia; PEK punctate epithelial keratitis; PEE punctate epithelial erosions; DES dry eye syndrome; MGD meibomian gland dysfunction; ATs artificial tears; PFAT's preservative free artificial tears; Sod[place] nuclear sclerotic cataract; PSC posterior subcapsular cataract; ERM epi-retinal membrane; PVD posterior vitreous detachment; RD retinal detachment; DM diabetes mellitus; DR diabetic retinopathy; NPDR non-proliferative diabetic retinopathy; PDR proliferative diabetic retinopathy; CSME clinically significant macular edema; DME diabetic macular edema; dbh dot blot hemorrhages; CWS cotton wool spot; POAG primary open angle glaucoma; C/D cup-to-disc ratio; HVF humphrey visual field; GVF goldmann visual field; OCT optical coherence tomography; IOP intraocular pressure; BRVO Branch retinal vein occlusion; CRVO central retinal vein occlusion; CRAO central retinal artery occlusion; BRAO branch retinal artery occlusion; RT retinal tear; SB  scleral buckle; PPV pars plana vitrectomy; VH Vitreous hemorrhage; PRP panretinal laser photocoagulation; IVK intravitreal kenalog; VMT vitreomacular traction; MH Macular hole;  NVD neovascularization of the disc; NVE neovascularization elsewhere; AREDS age related eye disease study; ARMD age related macular degeneration; POAG primary open angle glaucoma; EBMD epithelial/anterior basement membrane dystrophy; ACIOL anterior chamber intraocular lens; IOL intraocular lens; PCIOL posterior chamber intraocular lens; Phaco/IOL phacoemulsification with intraocular lens placement; [place] photorefractive keratectomy; LASIK laser assisted in situ keratomileusis; HTN hypertension; DM diabetes mellitus; COPD chronic obstructive pulmonary disease

## 2022-06-05 LAB — COMPREHENSIVE METABOLIC PANEL
ALT: 16 IU/L (ref 0–44)
AST: 22 IU/L (ref 0–40)
Albumin/Globulin Ratio: 1.6 (ref 1.2–2.2)
Albumin: 4.1 g/dL (ref 3.7–4.7)
Alkaline Phosphatase: 71 IU/L (ref 44–121)
BUN/Creatinine Ratio: 22 (ref 10–24)
BUN: 17 mg/dL (ref 8–27)
Bilirubin Total: 1.3 mg/dL — ABNORMAL HIGH (ref 0.0–1.2)
CO2: 24 mmol/L (ref 20–29)
Calcium: 9.1 mg/dL (ref 8.6–10.2)
Chloride: 104 mmol/L (ref 96–106)
Creatinine, Ser: 0.78 mg/dL (ref 0.76–1.27)
Globulin, Total: 2.5 g/dL (ref 1.5–4.5)
Glucose: 96 mg/dL (ref 70–99)
Potassium: 4.7 mmol/L (ref 3.5–5.2)
Sodium: 143 mmol/L (ref 134–144)
Total Protein: 6.6 g/dL (ref 6.0–8.5)
eGFR: 86 mL/min/{1.73_m2} (ref >59)

## 2022-06-05 LAB — TSH: TSH: 0.653 u[IU]/mL (ref 0.450–4.500)

## 2022-06-05 LAB — T4, FREE: Free T4: 1.31 ng/dL (ref 0.82–1.77)

## 2022-06-06 ENCOUNTER — Encounter (INDEPENDENT_AMBULATORY_CARE_PROVIDER_SITE_OTHER): Payer: Medicare Other | Admitting: Ophthalmology

## 2022-06-07 IMAGING — CT CT ABD-PELV W/ CM
2 of 5 series · 16 of 46 positions shown, 18 images · IV contrast (Omnipaque or Isovue)
Comparison: CT abdomen and pelvis 12/29/2020; CT
chest-abdomen-pelvis 08/22/2020.

CLINICAL DATA: Colorectal cancer, surveillance; prostate cancer,
surveillance; cecal cancer surveillance.

EXAM:
CT ABDOMEN AND PELVIS WITH CONTRAST
TECHNIQUE: Multidetector CT imaging of the abdomen and pelvis was performed
using the standard protocol following bolus administration of
intravenous contrast.
CONTRAST:  85mL OMNIPAQUE IOHEXOL 300 MG/ML  SOLN

[Series 2: axial st · axial · 0.87mm/px · z∈[+740,+1165]mm · 13 of 97 slices shown, 15 images]
[im 6/97  soft-tissue]
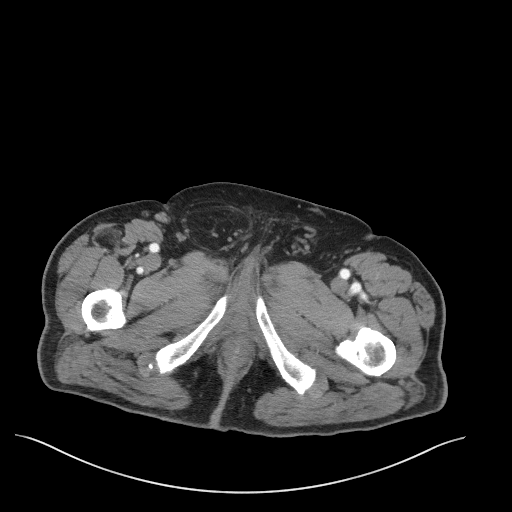
[im 6/97  bone]
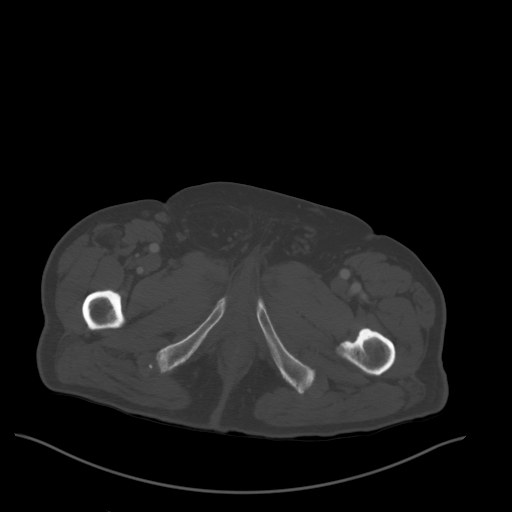
[im 12/97  soft-tissue]
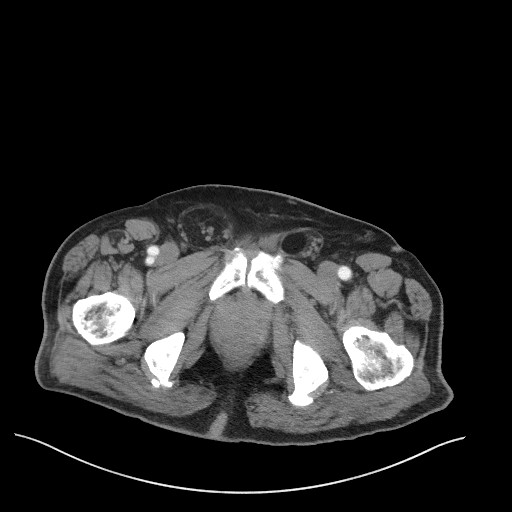
[im 23/97  soft-tissue]
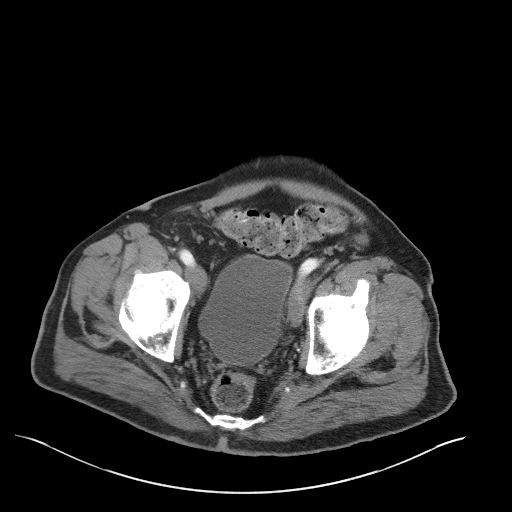
[im 29/97  soft-tissue]
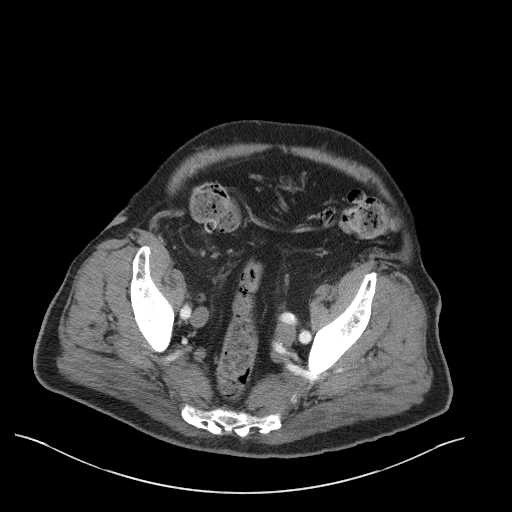
[im 34/97  soft-tissue]
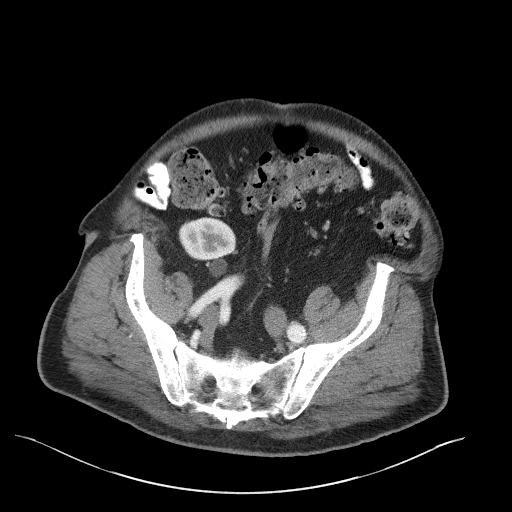
[im 40/97  soft-tissue]
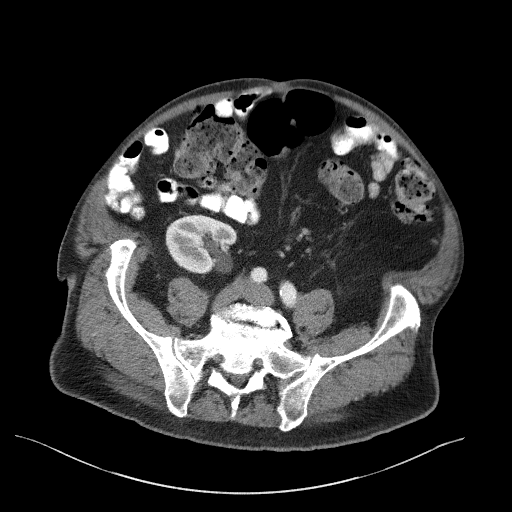
[im 51/97  soft-tissue]
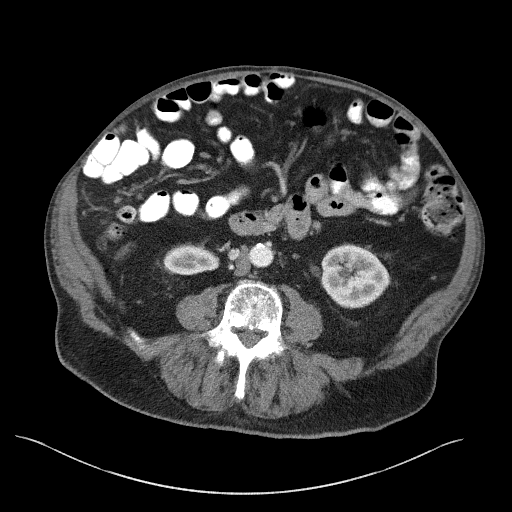
[im 57/97  soft-tissue]
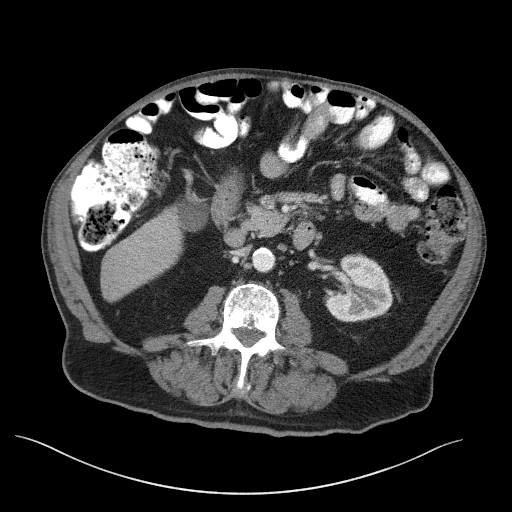
[im 63/97  soft-tissue]
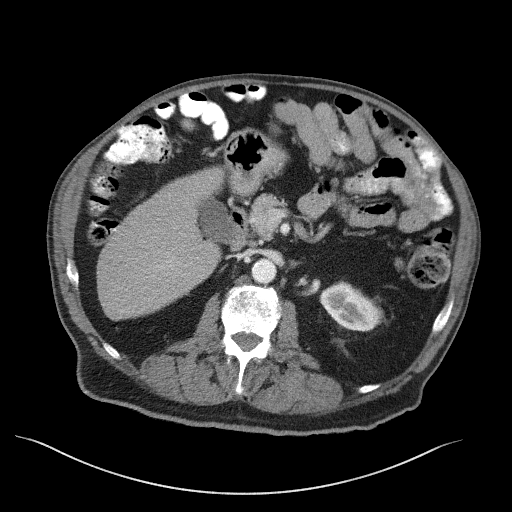
[im 63/97  bone]
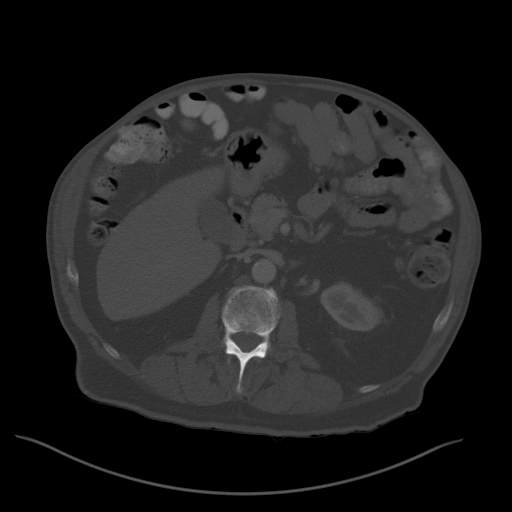
[im 68/97  soft-tissue]
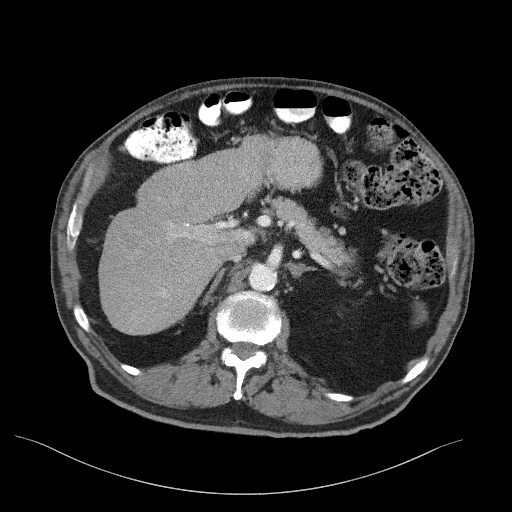
[im 74/97  soft-tissue]
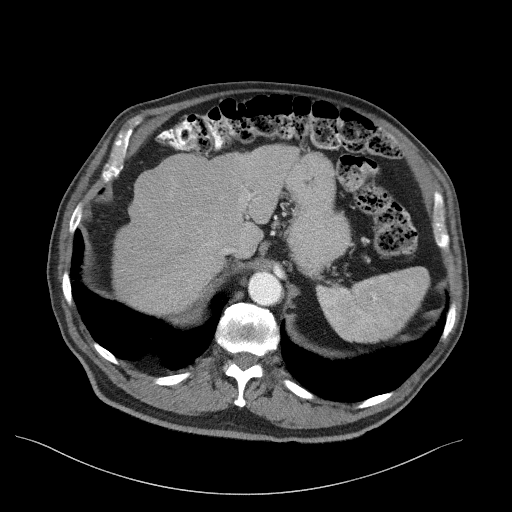
[im 85/97  soft-tissue]
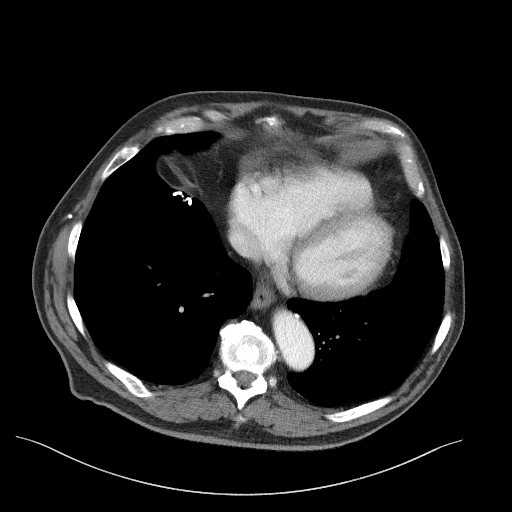
[im 91/97  soft-tissue]
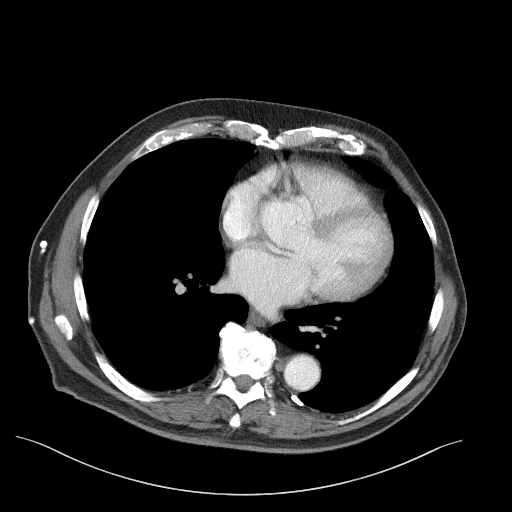

[Series 5: coronal st · coronal · 0.79mm/px · 3 of 117 slices shown]
[im 39/117  soft-tissue]
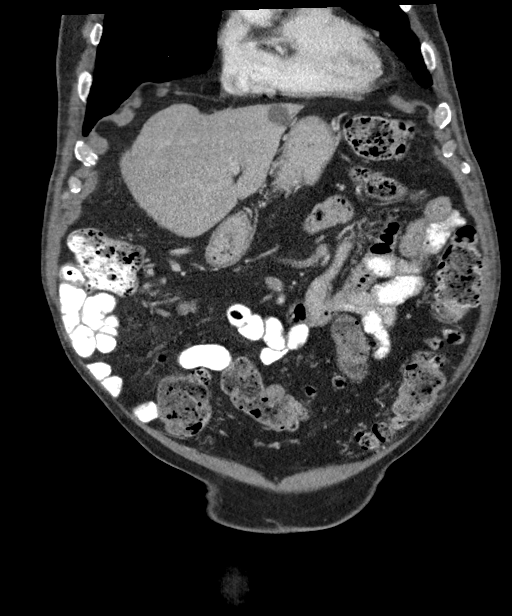
[im 52/117  soft-tissue]
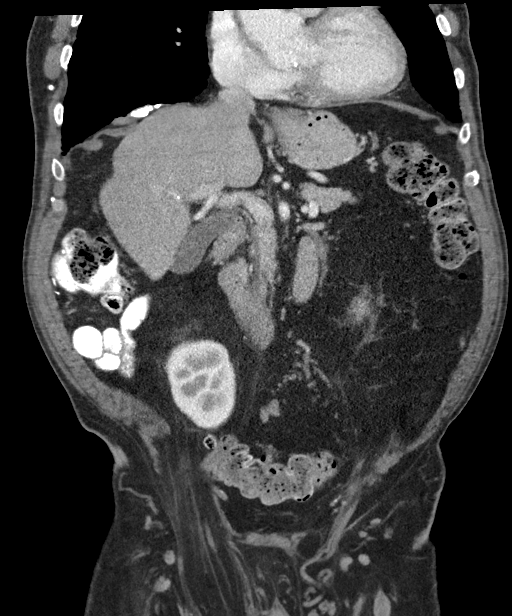
[im 65/117  soft-tissue]
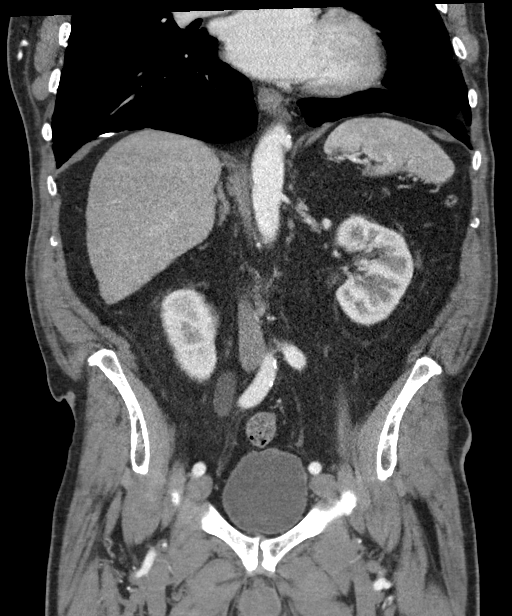

[16 of 46 positions shown; findings below may reference images not displayed]

FINDINGS: Lower chest: No acute abnormality. Bilateral calcified pleural
plaques are noted compatible with asbestos related pleural disease.

Hepatobiliary: Stable cyst within segment II of the left hepatic
lobe measuring 1.8 cm. No suspicious liver abnormality identified.
Gallbladder normal.

Pancreas: Unremarkable. No pancreatic ductal dilatation or
surrounding inflammatory changes.

Spleen: Normal in size without focal abnormality. Normal adrenal
glands.

Adrenals/Urinary Tract: No kidney mass or nephrolithiasis identified
bilaterally. Right-sided pelvocaliectasis. The right ureter is noted
extending into the patient's right inguinal hernia. This is similar
to previous exam. Bladder is unremarkable.

Stomach/Bowel: Small hiatal hernia. Status post right hemicolectomy.
Colonic diverticulosis identified without signs of acute
diverticulitis. No bowel wall thickening, inflammation, or
distension identified.

Vascular/Lymphatic: Aortic atherosclerosis. No signs of abdominal
aortic aneurysm. No abdominopelvic adenopathy.

Reproductive: Prostate gland enlargement.

Other: Bilateral inguinal hernias are identified which contain fat.
On the right, the right ureter extends into the right hernia but
does not appear dilated. No ascites or fluid collections within the
abdomen or pelvis.

Musculoskeletal: Degenerative disc disease is noted within the
lumbar spine, most advanced at L5-S1. There is 5 mm anterolisthesis
of L4 on L5 is noted. Bones appear osteopenic. No acute or
suspicious osseous findings. Remote healed right posterior rib
fractures.
IMPRESSION: 1. No acute findings identified within the abdomen or pelvis.
2. Status post right hemicolectomy. No findings of recurrent or
metastatic disease.
3. Bilateral inguinal hernias which contain fat. The right ureter
extends into the patient's right inguinal hernia but does not appear
dilated.
4. Bilateral calcified pleural plaques compatible with asbestos
related pleural disease.
5. Aortic atherosclerosis ([id number]).

## 2022-06-12 ENCOUNTER — Ambulatory Visit: Payer: Medicare Other | Admitting: "Endocrinology

## 2022-06-14 ENCOUNTER — Encounter: Payer: Self-pay | Admitting: "Endocrinology

## 2022-06-14 ENCOUNTER — Ambulatory Visit (INDEPENDENT_AMBULATORY_CARE_PROVIDER_SITE_OTHER): Payer: Medicare Other | Admitting: "Endocrinology

## 2022-06-14 DIAGNOSIS — E890 Postprocedural hypothyroidism: Secondary | ICD-10-CM

## 2022-06-14 MED ORDER — LEVOTHYROXINE SODIUM 100 MCG PO TABS: 100.0000 ug | ORAL_TABLET | Freq: Every day | ORAL | 3 refills | Status: DC

## 2022-06-14 NOTE — Progress Notes (Signed)
06/14/2022, 6:06 PM         Endocrinology follow-up note   Subjective:    Patient ID: Jerry Macdonald, male    DOB: 1931-02-01, PCP [name], MD   Past Medical History:  Diagnosis Date   Abnormal prostate biopsy    Back pain    with radiculopathy   Cataracts, bilateral    Constipation    Family history of lung cancer    Family history of ovarian cancer    Family history of prostate cancer    Goiter    HOH (hard of hearing)    Hyperlipidemia    Hypertrophy of prostate    Malaise and fatigue    Obesity    Pyogenic granuloma    left cheeck   S/P colonoscopy June 2011   rectal bleeding secondary to hemorrhoids   S/P colonoscopy 2007   [facility], diverticulosis per Dr. Juel Burrow note   S/P endoscopy June 2011   Barrett's, mild gastritis and duodenitis   Seborrheic keratosis    Sleep apnea    Past Surgical History:  Procedure Laterality Date   BIOPSY  03/27/2019   Procedure: BIOPSY;  Surgeon: Danie Binder, MD;  [facility];  Service: Endoscopy;;   BIOPSY  08/16/2020   Procedure: BIOPSY;  Surgeon: Eloise Harman, DO;  [facility];  Service: Endoscopy;;   COLONOSCOPY  JUN 2011   PAN-COLONIC DIVERTICULOSIS   [place]  2007   [place]   COLONOSCOPY WITH PROPOFOL N/A 08/16/2020   Procedure: COLONOSCOPY WITH PROPOFOL;  Surgeon: Eloise Harman, DO;  [facility];  Service: Endoscopy;  Laterality: N/A;  10:45am   ESOPHAGOGASTRODUODENOSCOPY  06/11/2011   Procedure: ESOPHAGOGASTRODUODENOSCOPY (EGD);  Surgeon: Dorothyann Peng, MD;  [facility];  Service: Endoscopy;  Laterality: N/A;  11:00   ESOPHAGOGASTRODUODENOSCOPY N/A 05/21/2014   Dr. Fields:Barrett's esophagus/ Medium sized hiatal hernia. path with indefinite dysplasia.    ESOPHAGOGASTRODUODENOSCOPY N/A 12/20/2014   Dr. Clayburn Pert gastric polyp/large HH/6 cm segment of Barrett's esophagus, path with  hyperplastic gastric polyp, no dysplasia. Surveillance April 2017 due    ESOPHAGOGASTRODUODENOSCOPY N/A 01/20/2016   Procedure: ESOPHAGOGASTRODUODENOSCOPY (EGD);  Surgeon: Danie Binder, MD;  [facility];  Service: Endoscopy;  Laterality: N/A;  1230pm   ESOPHAGOGASTRODUODENOSCOPY N/A 03/27/2019   Procedure: ESOPHAGOGASTRODUODENOSCOPY (EGD);  Surgeon: Danie Binder, MD;  [facility];  Service: Endoscopy;  Laterality: N/A;  12:15pm   HERNIA REPAIR     LAPAROSCOPIC PARTIAL COLECTOMY Right 09/28/2020   Procedure: LAPAROSCOPIC RIGHT HEMI COLECTOMY;  Surgeon: Virl Cagey, MD;  Location: [facility];  Service: General;  Laterality: Right;   THYROIDECTOMY N/A 01/23/2021   Procedure: TOTAL THYROIDECTOMY;  Surgeon: Virl Cagey, MD;  Location: [facility];  Service: General;  Laterality: N/A;   UMBILICAL HERNIA REPAIR     VARICOSE VEIN SURGERY     Social History   Socioeconomic History   Marital status: Divorced    Spouse name: Not on file   Number of children: Not on file   Years of education: Not on file   Highest education level: Not on file  Occupational History   Not on  file  Tobacco Use   Smoking status: Former    Packs/day: 0.50    Years: 2.00    Total pack years: 1.00    Types: Cigarettes    Quit date: 09/10/1963    Years since quitting: 58.8   Smokeless tobacco: Never   Tobacco comments:    quit in 1965  Vaping Use   Vaping Use: Former  Substance and Sexual Activity   Alcohol use: Not Currently   Drug use: No   Sexual activity: Not Currently  Other Topics Concern   Not on file  Social History Narrative   Not on file   Social Determinants of Health   Financial Resource Strain: Not on file  Food Insecurity: Not on file  Transportation Needs: No Transportation Needs (09/06/2020)   PRAPARE - Hydrologist (Medical): No    Lack of Transportation (Non-Medical): No  Physical Activity: Inactive (09/06/2020)   Exercise Vital  Sign    Days of Exercise per Week: 0 days    Minutes of Exercise per Session: 0 min  Stress: Not on file  Social Connections: Not on file   Family History  Problem Relation Age of Onset   Prostate cancer Father 67       metastatic   Lung cancer Brother 22       heavy smoker   Prostate cancer Paternal Uncle        metastatic   Alzheimer's disease Maternal Grandmother    Prostate cancer Maternal Grandfather 82       metastatic   Prostate cancer Paternal Grandfather 12       metastatic   Ovarian cancer Paternal Aunt 8   Prostate cancer Paternal Uncle        metastatic   Cancer Cousin 78       unknown type, paternal first cousin   Liver cancer Cousin        dx 37s, paternal first cousin   Colon polyps Neg Hx    Colon cancer Neg Hx    Outpatient Encounter Medications as of 06/14/2022  Medication Sig   acetaminophen (TYLENOL) 500 MG tablet Take 500 mg by mouth every 8 (eight) hours as needed for moderate pain.   calcium-vitamin D (OSCAL WITH D) 500-5 MG-MCG tablet Take 1 tablet by mouth in the morning and at bedtime. Lunch and dinner   Cholecalciferol (VITAMIN D3) 25 MCG (1000 UT) CAPS Take 1 capsule by mouth 3 (three) times daily.   ferrous sulfate 324 MG TBEC Take 324 mg by mouth every other day.   finasteride (PROSCAR) 5 MG tablet TAKE 1 TABLET DAILY   levothyroxine (SYNTHROID) 100 MCG tablet Take 1 tablet (100 mcg total) by mouth daily before breakfast.   magnesium oxide (MAG-OX) 400 MG tablet Take 400 mg by mouth 2 (two) times daily.   Multiple Vitamins-Minerals (MULTIVITAMINS THER. W/MINERALS) TABS Take 1 tablet by mouth daily.   ondansetron (ZOFRAN-ODT) 4 MG disintegrating tablet Take 1 tablet (4 mg total) by mouth every 6 (six) hours as needed for nausea.   pantoprazole (PROTONIX) 40 MG tablet TAKE 1 TABLET ONCE A DAY BEFORE A MEAL, MAY TAKE SECOND TABLET BEFORE EVENING MEAL IF NEEDED   tamsulosin (FLOMAX) 0.4 MG CAPS capsule Take 1 capsule (0.4 mg total) by mouth daily.    traMADol (ULTRAM) 50 MG tablet Take 1 tablet (50 mg total) by mouth every 6 (six) hours as needed for moderate pain or severe pain.   [DISCONTINUED] levothyroxine (SYNTHROID)  100 MCG tablet Take 1 tablet (100 mcg total) by mouth daily before breakfast.   No facility-administered encounter medications on file as of 06/14/2022.   ALLERGIES: Allergies  Allergen Reactions   Penicillins     Swelling of hands and feet - severe Did it involve swelling of the face/tongue/throat, SOB, or low BP? No Did it involve sudden or severe rash/hives, skin peeling, or any reaction on the inside of your mouth or nose? No Did you need to seek medical attention at a hospital or doctor's office? No When did it last happen?      1950s If all above answers are "NO", may proceed with cephalosporin use.    Penicillin G Other (See Comments)    VACCINATION STATUS: Immunization History  Administered Date(s) Administered   DTaP 06/03/2013   H1N1 08/12/2008   Influenza Whole 07/07/2007, 05/20/2008   Influenza-Unspecified 06/03/2013, 06/03/2018   Pneumococcal-Unspecified 06/03/2013, 06/03/2014   Varicella 06/03/2013    HPI Nemesio LEALON VANPUTTEN is 88 y.o. male who is seen in follow-up for history of compressive multinodular goiter.    He underwent total thyroidectomy on Jan 23, 2021 by Dr. Blake Divine.   His surgical sample is negative for malignancy.    - He reports feeling better, previous compressive symptoms have resolved.  He is currently on thyroxine 100 mcg p.o. daily before breakfast.    He continues to tolerate.  His previsit labs are consistent with appropriate replacement.     He is accompanied by  Kathee Polite his friend and power of attorney during this telephone visit.  PMD:  [name], MD.   He is not an optimal historian.  History is obtained mainly from chart review. he has been dealing with symptoms of fatigue, positional shortness of breath, sleep apnea for several years. He reports  that he breathes better, swallows without difficulty. Marland Kitchen  He is a former smoker, denies any exposure to neck radiations. His other medical problems include BPH/prostate cancer, Barrett's esophagus, sleep apnea, and recently diagnosed cecal/colon cancer. He is cared for by an elderly friend Dola Argyle is also his power of attorney. He remains on low-dose calcium, vitamin D supplements.   Review of Systems  Limited as above.  Objective:       06/14/2022    3:12 PM 06/04/2022   10:00 AM 04/26/2022    3:26 PM  Vitals with BMI  Height 5' 11" 5' 11"   Weight 206 lbs 3 oz 205 lbs 14 oz   BMI 11.21 62.44   Systolic 695 072 257  Diastolic 62 74 76  Pulse 64 68 80    BP 100/62   Pulse 64   Ht 5' 11" (1.803 m)   Wt 206 lb 3.2 oz (93.5 kg)   BMI 28.76 kg/m   Wt Readings from Last 3 Encounters:  06/14/22 206 lb 3.2 oz (93.5 kg)  06/04/22 205 lb 14.4 oz (93.4 kg)  12/11/21 202 lb 12.8 oz (92 kg)    Physical Exam    CMP ( most recent) CMP     Component Value Date/Time   NA 143 06/04/2022 1122   K 4.7 06/04/2022 1122   CL 104 06/04/2022 1122   CO2 24 06/04/2022 1122   GLUCOSE 96 06/04/2022 1122   GLUCOSE 102 (H) 05/28/2022 1111   BUN 17 06/04/2022 1122   CREATININE 0.78 06/04/2022 1122   CALCIUM 9.1 06/04/2022 1122   CALCIUM 8.6 01/23/2021 1552   PROT 6.6 06/04/2022 1122  ALBUMIN 4.1 06/04/2022 1122   AST 22 06/04/2022 1122   ALT 16 06/04/2022 1122   ALKPHOS 71 06/04/2022 1122   BILITOT 1.3 (H) 06/04/2022 1122   GFRNONAA >60 05/28/2022 1111   GFRAA  02/03/2010 1249    >60        The eGFR has been calculated using the MDRD equation. This calculation has not been validated in all clinical situations. eGFR's persistently <60 mL/min signify possible Chronic Kidney Disease.     Lipid Panel ( most recent) Lipid Panel     Component Value Date/Time   CHOL 139 08/12/2008 2244   TRIG 88 08/12/2008 2244   HDL 39 (L) 08/12/2008 2244   CHOLHDL 3.6 Ratio  08/12/2008 2244   VLDL 18 08/12/2008 2244   LDLCALC 82 08/12/2008 2244      Lab Results  Component Value Date   TSH 0.653 06/04/2022   TSH 0.778 12/04/2021   TSH 7.190 (H) 07/28/2021   TSH 0.727 02/02/2021   TSH 0.103 (L) 01/19/2021   TSH 0.198 (L) 10/21/2020   TSH 0.075 (L) 09/06/2020   TSH 0.772 08/09/2007   FREET4 1.31 06/04/2022   FREET4 1.35 12/04/2021   FREET4 1.12 07/28/2021   FREET4 1.02 02/02/2021   FREET4 1.06 10/21/2020    September 14, 2020 thyroid ultrasound: Right lobe measures 13.8 x 6.7 x 6.4 cm with large 5.3 cm nodule which was biopsied and findings were benign. Left lobe measured 9.9 x 4.9 x 3.5 cm with 2 nodules measuring 4.8 cm Pseudonodule not biopsied and 1.5 cm-suspicious biopsied.  Fine-needle aspiration biopsy results of only thyroid nodule is available today showing benign follicular nodule.  Total thyroidectomy Jan 23, 2021. Surgical sample was significant for nodular hyperplasia bilaterally.  Assessment & Plan:   1.  Postsurgical hypothyroidism 2. Multinodular goiter 3.  Hypocalcemia  His compressive symptoms have resolved.  His previsit thyroid function tests are consistent with appropriate replacement.  He is advised to continue levothyroxine 100 mcg p.o. daily before breakfast.    - We discussed about the correct intake of his thyroid hormone, on empty stomach at fasting, with water, separated by at least 30 minutes from breakfast and other medications,  and separated by more than 4 hours from calcium, iron, multivitamins, acid reflux medications (PPIs). -Patient is made aware of the fact that thyroid hormone replacement is needed for life, dose to be adjusted by periodic monitoring of thyroid function tests.   For his postsurgical mild hypocalcemia, he is advised to continue calcium/vitamin D  D 500- 200 1 tablet 3 times daily with lunch and supper.     - he is advised to maintain close follow up with [name], MD for primary care needs.     I spent 21 minutes in the care of the patient today including review of labs from Thyroid Function, CMP, and other relevant labs ; imaging/biopsy records (current and previous including abstractions from other facilities); face-to-face time discussing  his lab results and symptoms, medications doses, his options of short and long term treatment based on the latest standards of care / guidelines;   and documenting the encounter.  Jerry Macdonald  participated in the discussions, expressed understanding, and voiced agreement with the above plans.  All questions were answered to his satisfaction. he is encouraged to contact clinic should he have any questions or concerns prior to his return visit.     Follow up plan: Return in about 1 year (around 06/15/2023) for F/U with  Pre-visit Labs.   Glade Lloyd, MD [facility] [address] [place], [place] [address] Phone: [phone]  Fax: [phone]     06/14/2022, 6:06 PM  This note was partially dictated with voice recognition software. Similar sounding words can be transcribed inadequately or may not  be corrected upon review.

## 2022-07-16 ENCOUNTER — Encounter (INDEPENDENT_AMBULATORY_CARE_PROVIDER_SITE_OTHER): Payer: Medicare Other | Admitting: Ophthalmology

## 2022-07-16 DIAGNOSIS — H353132 Nonexudative age-related macular degeneration, bilateral, intermediate dry stage: Secondary | ICD-10-CM | POA: Diagnosis not present

## 2022-07-16 DIAGNOSIS — H353211 Exudative age-related macular degeneration, right eye, with active choroidal neovascularization: Secondary | ICD-10-CM | POA: Diagnosis not present

## 2022-07-16 DIAGNOSIS — H2513 Age-related nuclear cataract, bilateral: Secondary | ICD-10-CM | POA: Diagnosis not present

## 2022-07-16 DIAGNOSIS — H43823 Vitreomacular adhesion, bilateral: Secondary | ICD-10-CM | POA: Diagnosis not present

## 2022-07-19 ENCOUNTER — Encounter: Payer: Self-pay | Admitting: General Surgery

## 2022-07-19 ENCOUNTER — Ambulatory Visit (INDEPENDENT_AMBULATORY_CARE_PROVIDER_SITE_OTHER): Payer: Medicare Other | Admitting: General Surgery

## 2022-07-19 VITALS — BP 112/70 | HR 78 | Temp 97.9°F | Resp 14 | Ht 70.5 in | Wt 207.0 lb

## 2022-07-19 DIAGNOSIS — K432 Incisional hernia without obstruction or gangrene: Secondary | ICD-10-CM | POA: Diagnosis not present

## 2022-07-19 DIAGNOSIS — R140 Abdominal distension (gaseous): Secondary | ICD-10-CM

## 2022-07-19 NOTE — Progress Notes (Signed)
[facility] Note   HPI:  88 y.o. Male presents to clinic for his incisional hernias. He has two sites one superior and one inferior on the incision but these are  difficult to see on CT due to his very thin subcutaneous/ fascia layers. On physical exam it is more obvious. He thinks the lower hernia is getting worse. He is otherwise well. No pain.    Review of Systems:  Hernia on the incision  All other review of systems: otherwise negative   Vital Signs:  BP 112/70   Pulse 78   Temp 97.9 F (36.6 C) (Oral)   Resp 14   Ht 5' 10.5" (1.791 m)   Wt 207 lb (93.9 kg)   SpO2 94%   BMI 29.28 kg/m    Physical Exam:  Physical Exam Cardiovascular:     Rate and Rhythm: Normal rate.  Pulmonary:     Effort: Pulmonary effort is normal.  Abdominal:     General: There is distension.     Palpations: Abdomen is soft.     Tenderness: There is no abdominal tenderness.     Hernia: A hernia is present.     Comments: Superior midline 1cm defect in fascia, inferior midline incision 2 distinct hernias with fat that are easily reduced, non tender     Reviewed prior imaging 02/2022 and can appreciate hernia on the sagittal films only   Assessment:  88 y.o. yo Male with he wants to get these repaired. Will plan for January as Izora Gala his friend is having shoulder shoulder in December and he needs to be able to help [facility].  Will have CT the day prior to seeing me too which will be good. May be able to do open versus robotic  repair pending the size and his overall distention which is chronic.   Plan:  Call with changes.  Will see you in January to get you set up for surgery.   Future Appointments  Date Time Provider [place]  09/10/2022  1:00 PM AP-ACAPA LAB [facility] None  09/10/2022  2:00 PM AP-CT 1 AP-CT [place] [facility]  09/11/2022  9:00 AM Virl Cagey, MD RS-RS None  09/17/2022  2:15 PM Derek Jack, MD [facility] None  10/29/2022 10:30 AM AUR-LAB AUR-AUR None   11/01/2022  3:30 PM Irine Seal, MD AUR-AUR None  06/17/2023  1:00 PM Nida, Marella Chimes, MD REA-REA None     Curlene Labrum, MD [facility] [address] [place], [place] [address] [phone] (office)

## 2022-07-19 NOTE — Patient Instructions (Signed)
Call with changes.  Will see you in January to get you set up for surgery.

## 2022-08-16 DIAGNOSIS — H353132 Nonexudative age-related macular degeneration, bilateral, intermediate dry stage: Secondary | ICD-10-CM | POA: Diagnosis not present

## 2022-08-16 DIAGNOSIS — H2513 Age-related nuclear cataract, bilateral: Secondary | ICD-10-CM | POA: Diagnosis not present

## 2022-08-16 DIAGNOSIS — H43823 Vitreomacular adhesion, bilateral: Secondary | ICD-10-CM | POA: Diagnosis not present

## 2022-08-16 DIAGNOSIS — H353211 Exudative age-related macular degeneration, right eye, with active choroidal neovascularization: Secondary | ICD-10-CM | POA: Diagnosis not present

## 2022-09-10 ENCOUNTER — Inpatient Hospital Stay: Payer: Medicare Other | Attending: Hematology

## 2022-09-10 ENCOUNTER — Other Ambulatory Visit: Payer: Medicare Other

## 2022-09-10 ENCOUNTER — Ambulatory Visit (HOSPITAL_COMMUNITY)
Admission: RE | Admit: 2022-09-10 | Discharge: 2022-09-10 | Disposition: A | Discharge status: Home / Self Care | Payer: Medicare Other | Source: Ambulatory Visit | Attending: Hematology | Admitting: Hematology

## 2022-09-10 DIAGNOSIS — Z87891 Personal history of nicotine dependence: Secondary | ICD-10-CM | POA: Diagnosis not present

## 2022-09-10 DIAGNOSIS — Z800 Family history of malignant neoplasm of digestive organs: Secondary | ICD-10-CM | POA: Diagnosis not present

## 2022-09-10 DIAGNOSIS — D649 Anemia, unspecified: Secondary | ICD-10-CM | POA: Insufficient documentation

## 2022-09-10 DIAGNOSIS — C180 Malignant neoplasm of cecum: Secondary | ICD-10-CM

## 2022-09-10 DIAGNOSIS — E611 Iron deficiency: Secondary | ICD-10-CM | POA: Insufficient documentation

## 2022-09-10 DIAGNOSIS — D509 Iron deficiency anemia, unspecified: Secondary | ICD-10-CM

## 2022-09-10 DIAGNOSIS — K5730 Diverticulosis of large intestine without perforation or abscess without bleeding: Secondary | ICD-10-CM | POA: Diagnosis not present

## 2022-09-10 DIAGNOSIS — Z8041 Family history of malignant neoplasm of ovary: Secondary | ICD-10-CM | POA: Insufficient documentation

## 2022-09-10 DIAGNOSIS — Z801 Family history of malignant neoplasm of trachea, bronchus and lung: Secondary | ICD-10-CM | POA: Insufficient documentation

## 2022-09-10 DIAGNOSIS — Z8042 Family history of malignant neoplasm of prostate: Secondary | ICD-10-CM | POA: Diagnosis not present

## 2022-09-10 LAB — CBC WITH DIFFERENTIAL/PLATELET
Abs Immature Granulocytes: 0.02 10*3/uL (ref 0.00–0.07)
Basophils Absolute: 0 10*3/uL (ref 0.0–0.1)
Basophils Relative: 0 %
Eosinophils Absolute: 0.1 10*3/uL (ref 0.0–0.5)
Eosinophils Relative: 2 %
HCT: 40.9 % (ref 39.0–52.0)
Hemoglobin: 13.8 g/dL (ref 13.0–17.0)
Immature Granulocytes: 0 %
Lymphocytes Relative: 22 %
Lymphs Abs: 1.6 10*3/uL (ref 0.7–4.0)
MCH: 34.2 pg — ABNORMAL HIGH (ref 26.0–34.0)
MCHC: 33.7 g/dL (ref 30.0–36.0)
MCV: 101.5 fL — ABNORMAL HIGH (ref 80.0–100.0)
Monocytes Absolute: 0.7 10*3/uL (ref 0.1–1.0)
Monocytes Relative: 10 %
Neutro Abs: 4.6 10*3/uL (ref 1.7–7.7)
Neutrophils Relative %: 66 %
Platelets: 202 10*3/uL (ref 150–400)
RBC: 4.03 MIL/uL — ABNORMAL LOW (ref 4.22–5.81)
RDW: 12.7 % (ref 11.5–15.5)
WBC: 7 10*3/uL (ref 4.0–10.5)
nRBC: 0 % (ref 0.0–0.2)

## 2022-09-10 LAB — COMPREHENSIVE METABOLIC PANEL
ALT: 20 U/L (ref 0–44)
AST: 24 U/L (ref 15–41)
Albumin: 3.8 g/dL (ref 3.5–5.0)
Alkaline Phosphatase: 50 U/L (ref 38–126)
Anion gap: 6 (ref 5–15)
BUN: 14 mg/dL (ref 8–23)
CO2: 27 mmol/L (ref 22–32)
Calcium: 8.6 mg/dL — ABNORMAL LOW (ref 8.9–10.3)
Chloride: 104 mmol/L (ref 98–111)
Creatinine, Ser: 0.81 mg/dL (ref 0.61–1.24)
GFR, Estimated: >60 mL/min (ref >60)
Glucose, Bld: 88 mg/dL (ref 70–99)
Potassium: 4.1 mmol/L (ref 3.5–5.1)
Sodium: 137 mmol/L (ref 135–145)
Total Bilirubin: 1.4 mg/dL — ABNORMAL HIGH (ref 0.3–1.2)
Total Protein: 6.6 g/dL (ref 6.5–8.1)

## 2022-09-10 LAB — FERRITIN: Ferritin: 37 ng/mL (ref 24–336)

## 2022-09-10 LAB — IRON AND TIBC
Iron: 118 ug/dL (ref 45–182)
Saturation Ratios: 44 % — ABNORMAL HIGH (ref 17.9–39.5)
TIBC: 268 ug/dL (ref 250–450)
UIBC: 150 ug/dL

## 2022-09-10 MED ORDER — IOHEXOL 300 MG/ML  SOLN
100.0000 mL | Freq: Once | INTRAMUSCULAR | Status: AC | PRN
  Administered 2022-09-10: 100 mL via INTRAVENOUS

## 2022-09-10 MED ORDER — IOHEXOL 9 MG/ML PO SOLN
ORAL | Status: AC
  Filled 2022-09-10: qty 1000

## 2022-09-11 ENCOUNTER — Encounter: Payer: Self-pay | Admitting: General Surgery

## 2022-09-11 ENCOUNTER — Ambulatory Visit (INDEPENDENT_AMBULATORY_CARE_PROVIDER_SITE_OTHER): Payer: Medicare Other | Admitting: General Surgery

## 2022-09-11 VITALS — BP 120/73 | HR 81 | Temp 97.5°F | Resp 16 | Ht 70.5 in | Wt 207.0 lb

## 2022-09-11 DIAGNOSIS — K432 Incisional hernia without obstruction or gangrene: Secondary | ICD-10-CM | POA: Diagnosis not present

## 2022-09-11 NOTE — Progress Notes (Unsigned)
[facility] History and Physical  Reason for Referral:*** Referring Physician: ***  Chief Complaint   Follow-up     Jerry Macdonald is a 89 y.o. male.  HPI: ***.  The *** started *** and has had a duration of ***.  It is associated with ***.  The *** is improved with ***, and is made worse with ***.    Quality*** Context***  Past Medical History:  Diagnosis Date   Abnormal prostate biopsy    Back pain    with radiculopathy   Cataracts, bilateral    Constipation    Family history of lung cancer    Family history of ovarian cancer    Family history of prostate cancer    Goiter    HOH (hard of hearing)    Hyperlipidemia    Hypertrophy of prostate    Malaise and fatigue    Obesity    Pyogenic granuloma    left cheeck   S/P colonoscopy June 2011   rectal bleeding secondary to hemorrhoids   S/P colonoscopy 2007   [facility], diverticulosis per Dr. Juel Burrow note   S/P endoscopy June 2011   Barrett's, mild gastritis and duodenitis   Seborrheic keratosis    Sleep apnea     Past Surgical History:  Procedure Laterality Date   BIOPSY  03/27/2019   Procedure: BIOPSY;  Surgeon: Danie Binder, MD;  [facility];  Service: Endoscopy;;   BIOPSY  08/16/2020   Procedure: BIOPSY;  Surgeon: Eloise Harman, DO;  [facility];  Service: Endoscopy;;   COLONOSCOPY  JUN 2011   PAN-COLONIC DIVERTICULOSIS   [place]  [place]   [place] [facility]   COLONOSCOPY WITH PROPOFOL N/A 08/16/2020   Procedure: COLONOSCOPY WITH PROPOFOL;  Surgeon: Eloise Harman, DO;  [facility];  Service: Endoscopy;  Laterality: N/A;  10:45am   ESOPHAGOGASTRODUODENOSCOPY  06/11/2011   Procedure: ESOPHAGOGASTRODUODENOSCOPY (EGD);  Surgeon: Dorothyann Peng, MD;  [facility];  Service: Endoscopy;  Laterality: N/A;  11:00   ESOPHAGOGASTRODUODENOSCOPY N/A 05/21/2014   Dr. Fields:Barrett's esophagus/ Medium sized hiatal hernia. path with indefinite dysplasia.     ESOPHAGOGASTRODUODENOSCOPY N/A 12/20/2014   Dr. Clayburn Pert gastric polyp/large HH/6 cm segment of Barrett's esophagus, path with hyperplastic gastric polyp, no dysplasia. Surveillance April 2017 due    ESOPHAGOGASTRODUODENOSCOPY N/A 01/20/2016   Procedure: ESOPHAGOGASTRODUODENOSCOPY (EGD);  Surgeon: Danie Binder, MD;  [facility];  Service: Endoscopy;  Laterality: N/A;  1230pm   ESOPHAGOGASTRODUODENOSCOPY N/A 03/27/2019   Procedure: ESOPHAGOGASTRODUODENOSCOPY (EGD);  Surgeon: Danie Binder, MD;  [facility];  Service: Endoscopy;  Laterality: N/A;  12:15pm   HERNIA REPAIR     LAPAROSCOPIC PARTIAL COLECTOMY Right 09/28/2020   Procedure: LAPAROSCOPIC RIGHT HEMI COLECTOMY;  Surgeon: Virl Cagey, MD;  Location: [facility];  Service: General;  Laterality: Right;   THYROIDECTOMY N/A 01/23/2021   Procedure: TOTAL THYROIDECTOMY;  Surgeon: Virl Cagey, MD;  Location: [facility];  Service: General;  Laterality: N/A;   UMBILICAL HERNIA REPAIR     VARICOSE VEIN SURGERY      Family History  Problem Relation Age of Onset   Prostate cancer Father 2       metastatic   Lung cancer Brother 78       heavy smoker   Prostate cancer Paternal Uncle        metastatic   Alzheimer's disease Maternal Grandmother    Prostate cancer Maternal Grandfather 42  metastatic   Prostate cancer Paternal Grandfather 48       metastatic   Ovarian cancer Paternal Aunt 14   Prostate cancer Paternal Uncle        metastatic   Cancer Cousin 67       unknown type, paternal first cousin   Liver cancer Cousin        dx 82s, paternal first cousin   Colon polyps Neg Hx    Colon cancer Neg Hx     Social History   Tobacco Use   Smoking status: Former    Packs/day: 0.50    Years: 2.00    Total pack years: 1.00    Types: Cigarettes    Quit date: 09/10/1963    Years since quitting: 59.0   Smokeless tobacco: Never   Tobacco comments:    quit in 1965  Vaping Use   Vaping Use: Former   Substance Use Topics   Alcohol use: Not Currently   Drug use: No    Medications: {medication reviewed/display:3041432} Allergies as of 09/11/2022       Reactions   Penicillins    Swelling of hands and feet - severe Did it involve swelling of the face/tongue/throat, SOB, or low BP? No Did it involve sudden or severe rash/hives, skin peeling, or any reaction on the inside of your mouth or nose? No Did you need to seek medical attention at a hospital or doctor's office? No When did it last happen?      1950s If all above answers are "NO", may proceed with cephalosporin use.   Penicillin G Other (See Comments)        Medication List        Accurate as of September 11, 2022  9:28 AM. If you have any questions, ask your nurse or doctor.          acetaminophen 500 MG tablet Commonly known as: TYLENOL Take 500 mg by mouth every 8 (eight) hours as needed for moderate pain.   calcium-vitamin D 500-5 MG-MCG tablet Commonly known as: OSCAL WITH D Take 1 tablet by mouth in the morning and at bedtime. Lunch and dinner   ferrous sulfate 324 MG Tbec Take 324 mg by mouth every other day.   finasteride 5 MG tablet Commonly known as: PROSCAR TAKE 1 TABLET DAILY   levothyroxine 100 MCG tablet Commonly known as: SYNTHROID Take 1 tablet (100 mcg total) by mouth daily before breakfast.   magnesium oxide 400 MG tablet Commonly known as: MAG-OX Take 400 mg by mouth 2 (two) times daily.   multivitamins ther. w/minerals Tabs tablet Take 1 tablet by mouth daily.   ondansetron 4 MG disintegrating tablet Commonly known as: ZOFRAN-ODT Take 1 tablet (4 mg total) by mouth every 6 (six) hours as needed for nausea.   pantoprazole 40 MG tablet Commonly known as: PROTONIX TAKE 1 TABLET ONCE A DAY BEFORE A MEAL, MAY TAKE SECOND TABLET BEFORE EVENING MEAL IF NEEDED   tamsulosin 0.4 MG Caps capsule Commonly known as: FLOMAX Take 1 capsule (0.4 mg total) by mouth daily.   traMADol 50 MG  tablet Commonly known as: ULTRAM Take 1 tablet (50 mg total) by mouth every 6 (six) hours as needed for moderate pain or severe pain.   Vitamin D3 25 MCG (1000 UT) Caps Take 1 capsule by mouth 3 (three) times daily.         ROS:  {Review of Systems:30496}  Blood pressure 120/73, pulse 81, temperature (!) 97.5 F (  36.4 C), temperature source Oral, resp. rate 16, height 5' 10.5" (1.791 m), weight 207 lb (93.9 kg), SpO2 97 %. Physical Exam  Results: Results for orders placed or performed in visit on 09/10/22 (from the past 48 hour(s))  CBC with Differential     Status: Abnormal   Collection Time: 09/10/22 12:59 PM  Result Value Ref Range   WBC 7.0 4.0 - 10.5 K/uL   RBC 4.03 (L) 4.22 - 5.81 MIL/uL   Hemoglobin 13.8 13.0 - 17.0 g/dL   HCT 40.9 39.0 - 52.0 %   MCV 101.5 (H) 80.0 - 100.0 fL   MCH 34.2 (H) 26.0 - 34.0 pg   MCHC 33.7 30.0 - 36.0 g/dL   RDW 12.7 11.5 - 15.5 %   Platelets 202 150 - 400 K/uL   nRBC 0.0 0.0 - 0.2 %   Neutrophils Relative % 66 %   Neutro Abs 4.6 1.7 - 7.7 K/uL   Lymphocytes Relative 22 %   Lymphs Abs 1.6 0.7 - 4.0 K/uL   Monocytes Relative 10 %   Monocytes Absolute 0.7 0.1 - 1.0 K/uL   Eosinophils Relative 2 %   Eosinophils Absolute 0.1 0.0 - 0.5 K/uL   Basophils Relative 0 %   Basophils Absolute 0.0 0.0 - 0.1 K/uL   Immature Granulocytes 0 %   Abs Immature Granulocytes 0.02 0.00 - 0.07 K/uL    Comment: Performed at [facility], [address] [place], [place] [address]  Comprehensive metabolic panel     Status: Abnormal   Collection Time: 09/10/22 12:59 PM  Result Value Ref Range   Sodium 137 135 - 145 mmol/L   Potassium 4.1 3.5 - 5.1 mmol/L   Chloride 104 98 - 111 mmol/L   CO2 27 22 - 32 mmol/L   Glucose, Bld 88 70 - 99 mg/dL    Comment: Glucose reference range applies only to samples taken after fasting for at least 8 hours.   BUN 14 8 - 23 mg/dL   Creatinine, Ser 0.81 0.61 - 1.24 mg/dL   Calcium 8.6 (L) 8.9 - 10.3 mg/dL   Total  Protein 6.6 6.5 - 8.1 g/dL   Albumin 3.8 3.5 - 5.0 g/dL   AST 24 15 - 41 U/L   ALT 20 0 - 44 U/L   Alkaline Phosphatase 50 38 - 126 U/L   Total Bilirubin 1.4 (H) 0.3 - 1.2 mg/dL   GFR, Estimated >60 >60 mL/min    Comment: (NOTE) Calculated using the CKD-EPI Creatinine Equation (2021)    Anion gap 6 5 - 15    Comment: Performed at [facility], [address], [place], [place] [address]  Iron and TIBC [facility] ONLY)     Status: Abnormal   Collection Time: 09/10/22 12:59 PM  Result Value Ref Range   Iron 118 45 - 182 ug/dL   TIBC 268 250 - 450 ug/dL   Saturation Ratios 44 (H) 17.9 - 39.5 %   UIBC 150 ug/dL    Comment: Performed at [facility], [address] [place], [place] [address]  Ferritin     Status: None   Collection Time: 09/10/22 12:59 PM  Result Value Ref Range   Ferritin 37 24 - 336 ng/mL    Comment: Performed at [facility], [address], [place], [place] [address]    CT Abdomen Pelvis W Contrast  Result Date: 09/11/2022 CLINICAL DATA:  Cecal adenocarcinoma. Previous right colectomy. * Tracking Code: BO * EXAM: CT ABDOMEN AND PELVIS  WITH CONTRAST TECHNIQUE: Multidetector CT imaging of the abdomen and pelvis was performed using the standard protocol following bolus administration of intravenous contrast. RADIATION DOSE REDUCTION: This exam was performed according to the departmental dose-optimization program which includes automated exposure control, adjustment of the mA and/or kV according to patient size and/or use of iterative reconstruction technique. CONTRAST:  130m OMNIPAQUE IOHEXOL 300 MG/ML  SOLN COMPARISON:  02/12/2022 FINDINGS: Lower Chest: No acute findings. Hepatobiliary: No hepatic masses identified. Stable small left hepatic lobe cyst noted. Gallbladder is unremarkable. No evidence of biliary ductal dilatation. Pancreas:  No mass or inflammatory changes. Spleen: Within normal limits in size and appearance. Adrenals/Urinary Tract: No suspicious  masses identified. No evidence of ureteral calculi or hydronephrosis. Stomach/Bowel: Tiny hiatal hernia noted which is decreased in size. Postop changes seen from previous right colectomy. No mass identified. Diffuse colonic diverticulosis is again seen, however there is no evidence of diverticulitis. No evidence of obstruction, inflammatory process or abnormal fluid collections. Vascular/Lymphatic: No pathologically enlarged lymph nodes. No acute vascular findings. Aortic atherosclerotic calcification incidentally noted. Reproductive:  Stable moderately enlarged prostate gland. Other: Moderate to large right inguinal hernia and small left inguinal hernia show no significant change, both containing only fat. Musculoskeletal: No suspicious bone lesions identified. Old right posterior rib fracture deformities again noted. IMPRESSION: No evidence of recurrent or metastatic carcinoma within the abdomen or pelvis. No acute findings. Colonic diverticulosis, without radiographic evidence of diverticulitis. Stable moderately enlarged prostate. Stable bilateral inguinal hernias, right greater than left, both containing only fat. Aortic Atherosclerosis (ICD10-I70.0). Electronically Signed   By: JMarlaine HindM.D.   On: 09/11/2022 09:05     Assessment & Plan:  DMAHAD NEWSTROMis a 89y.o. male with *** -*** -*** -Follow up ***  All questions were answered to the satisfaction of the patient and family***.  The risk and benefits of *** were discussed including but not limited to ***.  After careful consideration, DJAGGAR BENKOhas decided to ***.    LVirl Cagey1/05/2023, 9:28 AM

## 2022-09-11 NOTE — Patient Instructions (Signed)
Open Hernia Repair, Adult Open hernia repair is a surgical procedure to fix a hernia. A hernia occurs when an internal organ or tissue pushes through a weak spot in the muscles along the wall of the abdomen. Hernias commonly occur in the groin and around the belly button. Most hernias tend to get worse over time. Often, surgery is done to prevent the hernia from becoming bigger, uncomfortable, or an emergency. Emergency surgery may be needed if contents of the abdomen get stuck in the opening (incarcerated hernia) or if the blood supply gets cut off (strangulated hernia). In an open repair, an incision is made in the abdomen to perform the surgery. Tell a health care provider about: Any allergies you have. All medicines you are taking, including vitamins, herbs, eye drops, creams, and over-the-counter medicines. Any problems you or family members have had with anesthetic medicines. Any blood or bone disorders you have. Any surgeries you have had. Any medical conditions you have, including any recent cold or flu (influenza)symptoms. Whether you are pregnant or may be pregnant. What are the risks? Generally, this is a safe procedure. However, problems may occur, including: Long-lasting (chronic) pain. Bleeding. Infection. Damage to the testicles. This can cause shrinking or swelling. Damage to nearby structures or organs, including the bladder, blood vessels, intestines, or nerves near the hernia. Blood clots. Trouble passing urine. Return of the hernia. What happens before the procedure? Staying hydrated Follow instructions from your health care provider about hydration, which may include: Up to 2 hours before the procedure - you may continue to drink clear liquids, such as water, clear fruit juice, black coffee, and plain tea.  Medicines Ask your health care provider about: Changing or stopping your regular medicines. This is especially important if you are taking diabetes medicines or  blood thinners. Taking medicines such as aspirin and ibuprofen. These medicines can thin your blood. Do not take these medicines unless your health care provider tells you to take them. Taking over-the-counter medicines, vitamins, herbs, and supplements. Surgery safety Ask your health care provider: How your surgery site will be marked. What steps will be taken to help prevent infection. These steps may include: Removing hair at the surgery site. Washing skin with a germ-killing soap. Receiving antibiotic medicine. General instructions You may have an exam or testing, such as blood tests or imaging studies. Do not use any products that contain nicotine or tobacco for at least 4 weeks before the procedure. These products include cigarettes, chewing tobacco, and vaping devices, such as e-cigarettes. If you need help quitting, ask your health care provider. Let your health care provider know if you develop a cold or any infection before your surgery. If you get an infection before surgery, you may receive antibiotics to treat it. Plan to have a responsible adult take you home from the hospital or clinic. If you will be going home right after the procedure, plan to have a responsible adult care for you for the time you are told. This is important. What happens during the procedure?  An IV will be inserted into one of your veins. You will be given one or more of the following: A medicine to help you relax (sedative). A medicine to numb the area (local anesthetic). A medicine to make you fall asleep (general anesthetic). Your surgeon will make an incision over the hernia. The tissues of the hernia will be moved back into place. The edges of the hernia may be stitched (sutured) together. The opening in the  abdominal muscles will be closed with stitches (sutures). Or, your surgeon will place a mesh patch made of artificial (synthetic) material over the opening. The incision will be closed with  sutures, skin glue, or adhesive strips. A bandage (dressing) may be placed over the incision. The procedure may vary among health care providers and hospitals. What happens after the procedure? Your blood pressure, heart rate, breathing rate, and blood oxygen level will be monitored until you leave the hospital or clinic. You may be given medicine for pain. If you were given a sedative during the procedure, it can affect you for several hours. Do not drive or operate machinery until your health care provider says that it is safe. Summary Open hernia repair is a surgical procedure to fix a hernia. Hernias commonly occur in the groin and around the belly button. Emergency surgery may be needed if contents of the abdomen get stuck in the opening (incarcerated hernia) or if the blood supply gets cut off (strangulated hernia). In this procedure, an incision is made in the abdomen to perform the surgery. After the procedure, you may be given medicine for pain. This information is not intended to replace advice given to you by your health care provider. Make sure you discuss any questions you have with your health care provider. Document Revised: 04/04/2020 Document Reviewed: 04/04/2020 Elsevier Patient Education  [employer].

## 2022-09-12 LAB — CEA: CEA: 2.6 ng/mL (ref 0.0–4.7)

## 2022-09-13 NOTE — H&P (Signed)
[facility] History and Physical   Chief Complaint   Follow-up     Jerry Macdonald is a 89 y.o. male.  HPI: Mr. Ranieri is known to me with a hernia at this prior incision site after a laparoscopic right hemicolectomy. He says he notices the bulge and that it does not cause him any discomfort but he is worried about it getting worse. He stays active walking and just had a CT that demonstrated no recurrence of any cancer. He is doing well and says he wants to get this repaired.   Past Medical History:  Diagnosis Date   Abnormal prostate biopsy    Back pain    with radiculopathy   Cataracts, bilateral    Constipation    Family history of lung cancer    Family history of ovarian cancer    Family history of prostate cancer    Goiter    HOH (hard of hearing)    Hyperlipidemia    Hypertrophy of prostate    Malaise and fatigue    Obesity    Pyogenic granuloma    left cheeck   S/P colonoscopy June 2011   rectal bleeding secondary to hemorrhoids   S/P colonoscopy 2007   [facility], diverticulosis per Dr. Juel Burrow note   S/P endoscopy June 2011   Barrett's, mild gastritis and duodenitis   Seborrheic keratosis    Sleep apnea     Past Surgical History:  Procedure Laterality Date   BIOPSY  03/27/2019   Procedure: BIOPSY;  Surgeon: Danie Binder, MD;  [facility];  Service: Endoscopy;;   BIOPSY  08/16/2020   Procedure: BIOPSY;  Surgeon: Eloise Harman, DO;  [facility];  Service: Endoscopy;;   COLONOSCOPY  JUN 2011   PAN-COLONIC DIVERTICULOSIS   [place]  2007   [place]   COLONOSCOPY WITH PROPOFOL N/A 08/16/2020   Procedure: COLONOSCOPY WITH PROPOFOL;  Surgeon: Eloise Harman, DO;  [facility];  Service: Endoscopy;  Laterality: N/A;  10:45am   ESOPHAGOGASTRODUODENOSCOPY  06/11/2011   Procedure: ESOPHAGOGASTRODUODENOSCOPY (EGD);  Surgeon: Dorothyann Peng, MD;  [facility];  Service: Endoscopy;  Laterality:  N/A;  11:00   ESOPHAGOGASTRODUODENOSCOPY N/A 05/21/2014   Dr. Fields:Barrett's esophagus/ Medium sized hiatal hernia. path with indefinite dysplasia.    ESOPHAGOGASTRODUODENOSCOPY N/A 12/20/2014   Dr. Clayburn Pert gastric polyp/large HH/6 cm segment of Barrett's esophagus, path with hyperplastic gastric polyp, no dysplasia. Surveillance April 2017 due    ESOPHAGOGASTRODUODENOSCOPY N/A 01/20/2016   Procedure: ESOPHAGOGASTRODUODENOSCOPY (EGD);  Surgeon: Danie Binder, MD;  [facility];  Service: Endoscopy;  Laterality: N/A;  1230pm   ESOPHAGOGASTRODUODENOSCOPY N/A 03/27/2019   Procedure: ESOPHAGOGASTRODUODENOSCOPY (EGD);  Surgeon: Danie Binder, MD;  [facility];  Service: Endoscopy;  Laterality: N/A;  12:15pm   HERNIA REPAIR     LAPAROSCOPIC PARTIAL COLECTOMY Right 09/28/2020   Procedure: LAPAROSCOPIC RIGHT HEMI COLECTOMY;  Surgeon: Virl Cagey, MD;  Location: [facility];  Service: General;  Laterality: Right;   THYROIDECTOMY N/A 01/23/2021   Procedure: TOTAL THYROIDECTOMY;  Surgeon: Virl Cagey, MD;  Location: [facility];  Service: General;  Laterality: N/A;   UMBILICAL HERNIA REPAIR     VARICOSE VEIN SURGERY      Family History  Problem Relation Age of Onset   Prostate cancer Father 20       metastatic   Lung cancer Brother 25       heavy smoker  Prostate cancer Paternal Uncle        metastatic   Alzheimer's disease Maternal Grandmother    Prostate cancer Maternal Grandfather 82       metastatic   Prostate cancer Paternal Grandfather 9       metastatic   Ovarian cancer Paternal Aunt 12   Prostate cancer Paternal Uncle        metastatic   Cancer Cousin 29       unknown type, paternal first cousin   Liver cancer Cousin        dx 1s, paternal first cousin   Colon polyps Neg Hx    Colon cancer Neg Hx     Social History   Tobacco Use   Smoking status: Former    Packs/day: 0.50    Years: 2.00    Total pack years: 1.00    Types: Cigarettes     Quit date: 09/10/1963    Years since quitting: 59.0   Smokeless tobacco: Never   Tobacco comments:    quit in 1965  Vaping Use   Vaping Use: Former  Substance Use Topics   Alcohol use: Not Currently   Drug use: No    Medications: I have reviewed the patient's current medications. Allergies as of 09/11/2022       Reactions   Penicillins    Swelling of hands and feet - severe Did it involve swelling of the face/tongue/throat, SOB, or low BP? No Did it involve sudden or severe rash/hives, skin peeling, or any reaction on the inside of your mouth or nose? No Did you need to seek medical attention at a hospital or doctor's office? No When did it last happen?      1950s If all above answers are "NO", may proceed with cephalosporin use.   Penicillin G Other (See Comments)        Medication List        Accurate as of September 11, 2022  9:28 AM. If you have any questions, ask your nurse or doctor.          acetaminophen 500 MG tablet Commonly known as: TYLENOL Take 500 mg by mouth every 8 (eight) hours as needed for moderate pain.   calcium-vitamin D 500-5 MG-MCG tablet Commonly known as: OSCAL WITH D Take 1 tablet by mouth in the morning and at bedtime. Lunch and dinner   ferrous sulfate 324 MG Tbec Take 324 mg by mouth every other day.   finasteride 5 MG tablet Commonly known as: PROSCAR TAKE 1 TABLET DAILY   levothyroxine 100 MCG tablet Commonly known as: SYNTHROID Take 1 tablet (100 mcg total) by mouth daily before breakfast.   magnesium oxide 400 MG tablet Commonly known as: MAG-OX Take 400 mg by mouth 2 (two) times daily.   multivitamins ther. w/minerals Tabs tablet Take 1 tablet by mouth daily.   ondansetron 4 MG disintegrating tablet Commonly known as: ZOFRAN-ODT Take 1 tablet (4 mg total) by mouth every 6 (six) hours as needed for nausea.   pantoprazole 40 MG tablet Commonly known as: PROTONIX TAKE 1 TABLET ONCE A DAY BEFORE A MEAL, MAY TAKE SECOND  TABLET BEFORE EVENING MEAL IF NEEDED   tamsulosin 0.4 MG Caps capsule Commonly known as: FLOMAX Take 1 capsule (0.4 mg total) by mouth daily.   traMADol 50 MG tablet Commonly known as: ULTRAM Take 1 tablet (50 mg total) by mouth every 6 (six) hours as needed for moderate pain or severe pain.   Vitamin D3  25 MCG (1000 UT) Caps Take 1 capsule by mouth 3 (three) times daily.         ROS:  A comprehensive review of systems was negative except for: Gastrointestinal: positive for hernia defect  Blood pressure 120/73, pulse 81, temperature (!) 97.5 F (36.4 C), temperature source Oral, resp. rate 16, height 5' 10.5" (1.791 m), weight 207 lb (93.9 kg), SpO2 97 %. Physical Exam Vitals reviewed.  HENT:     Head: Normocephalic.     Mouth/Throat:     Mouth: Mucous membranes are moist.  Eyes:     Extraocular Movements: Extraocular movements intact.  Cardiovascular:     Rate and Rhythm: Normal rate.  Pulmonary:     Effort: Pulmonary effort is normal.  Abdominal:     Palpations: Abdomen is soft.     Comments: Rotund abdomen with hernia mid incision about 2cm in size, reducible  Musculoskeletal:        General: No swelling.     Cervical back: Normal range of motion.  Skin:    General: Skin is warm.  Neurological:     General: No focal deficit present.     Mental Status: He is alert and oriented to person, place, and time.  Psychiatric:        Mood and Affect: Mood normal.        Behavior: Behavior normal.     Results: Results for orders placed or performed in visit on 09/10/22 (from the past 48 hour(s))  CBC with Differential     Status: Abnormal   Collection Time: 09/10/22 12:59 PM  Result Value Ref Range   WBC 7.0 4.0 - 10.5 K/uL   RBC 4.03 (L) 4.22 - 5.81 MIL/uL   Hemoglobin 13.8 13.0 - 17.0 g/dL   HCT 40.9 39.0 - 52.0 %   MCV 101.5 (H) 80.0 - 100.0 fL   MCH 34.2 (H) 26.0 - 34.0 pg   MCHC 33.7 30.0 - 36.0 g/dL   RDW 12.7 11.5 - 15.5 %   Platelets 202 150 - 400 K/uL    nRBC 0.0 0.0 - 0.2 %   Neutrophils Relative % 66 %   Neutro Abs 4.6 1.7 - 7.7 K/uL   Lymphocytes Relative 22 %   Lymphs Abs 1.6 0.7 - 4.0 K/uL   Monocytes Relative 10 %   Monocytes Absolute 0.7 0.1 - 1.0 K/uL   Eosinophils Relative 2 %   Eosinophils Absolute 0.1 0.0 - 0.5 K/uL   Basophils Relative 0 %   Basophils Absolute 0.0 0.0 - 0.1 K/uL   Immature Granulocytes 0 %   Abs Immature Granulocytes 0.02 0.00 - 0.07 K/uL    Comment: Performed at [facility], [address], [place], [place] [address]  Comprehensive metabolic panel     Status: Abnormal   Collection Time: 09/10/22 12:59 PM  Result Value Ref Range   Sodium 137 135 - 145 mmol/L   Potassium 4.1 3.5 - 5.1 mmol/L   Chloride 104 98 - 111 mmol/L   CO2 27 22 - 32 mmol/L   Glucose, Bld 88 70 - 99 mg/dL    Comment: Glucose reference range applies only to samples taken after fasting for at least 8 hours.   BUN 14 8 - 23 mg/dL   Creatinine, Ser 0.81 0.61 - 1.24 mg/dL   Calcium 8.6 (L) 8.9 - 10.3 mg/dL   Total Protein 6.6 6.5 - 8.1 g/dL   Albumin 3.8 3.5 - 5.0 g/dL   AST 24 15 -  41 U/L   ALT 20 0 - 44 U/L   Alkaline Phosphatase 50 38 - 126 U/L   Total Bilirubin 1.4 (H) 0.3 - 1.2 mg/dL   GFR, Estimated >60 >60 mL/min    Comment: (NOTE) Calculated using the CKD-EPI Creatinine Equation (2021)    Anion gap 6 5 - 15    Comment: Performed at [facility], [address], [place], [place] [address]  Iron and TIBC [facility] ONLY)     Status: Abnormal   Collection Time: 09/10/22 12:59 PM  Result Value Ref Range   Iron 118 45 - 182 ug/dL   TIBC 268 250 - 450 ug/dL   Saturation Ratios 44 (H) 17.9 - 39.5 %   UIBC 150 ug/dL    Comment: Performed at [facility], [address], [place], [place] [address]  Ferritin     Status: None   Collection Time: 09/10/22 12:59 PM  Result Value Ref Range   Ferritin 37 24 - 336 ng/mL    Comment: Performed at [facility], [address] [place], [place] [address]    Personally reviewed- small hernia with bridge of fascia between total size about 2cm CT Abdomen Pelvis W Contrast  Result Date: 09/11/2022 CLINICAL DATA:  Cecal adenocarcinoma. Previous right colectomy. * Tracking Code: BO * EXAM: CT ABDOMEN AND PELVIS WITH CONTRAST TECHNIQUE: Multidetector CT imaging of the abdomen and pelvis was performed using the standard protocol following bolus administration of intravenous contrast. RADIATION DOSE REDUCTION: This exam was performed according to the departmental dose-optimization program which includes automated exposure control, adjustment of the mA and/or kV according to patient size and/or use of iterative reconstruction technique. CONTRAST:  180m OMNIPAQUE IOHEXOL 300 MG/ML  SOLN COMPARISON:  02/12/2022 FINDINGS: Lower Chest: No acute findings. Hepatobiliary: No hepatic masses identified. Stable small left hepatic lobe cyst noted. Gallbladder is unremarkable. No evidence of biliary ductal dilatation. Pancreas:  No mass or inflammatory changes. Spleen: Within normal limits in size and appearance. Adrenals/Urinary Tract: No suspicious masses identified. No evidence of ureteral calculi or hydronephrosis. Stomach/Bowel: Tiny hiatal hernia noted which is decreased in size. Postop changes seen from previous right colectomy. No mass identified. Diffuse colonic diverticulosis is again seen, however there is no evidence of diverticulitis. No evidence of obstruction, inflammatory process or abnormal fluid collections. Vascular/Lymphatic: No pathologically enlarged lymph nodes. No acute vascular findings. Aortic atherosclerotic calcification incidentally noted. Reproductive:  Stable moderately enlarged prostate gland. Other: Moderate to large right inguinal hernia and small left inguinal hernia show no significant change, both containing only fat. Musculoskeletal: No suspicious bone lesions identified. Old right posterior rib fracture deformities again noted. IMPRESSION: No  evidence of recurrent or metastatic carcinoma within the abdomen or pelvis. No acute findings. Colonic diverticulosis, without radiographic evidence of diverticulitis. Stable moderately enlarged prostate. Stable bilateral inguinal hernias, right greater than left, both containing only fat. Aortic Atherosclerosis (ICD10-I70.0). Electronically Signed   By: JMarlaine HindM.D.   On: 09/11/2022 09:05     Assessment & Plan:  DJAHNI NAZARis a 89y.o. male with a very rotund abdomen at baseline and a small hernia. His skin and subcutaneous tissue are very thin. Discussed open repair with mesh and risk of bleeding, infection, injury to bowel, recurrence. I think given his abdomen, his prior surgeries and his age an open repair will be the quickest and safest option.   All questions were answered to the satisfaction of the patient.   LVirl Cagey1/05/2023, 9:28 AM

## 2022-09-17 ENCOUNTER — Inpatient Hospital Stay (HOSPITAL_BASED_OUTPATIENT_CLINIC_OR_DEPARTMENT_OTHER): Payer: Medicare Other | Admitting: Hematology

## 2022-09-17 VITALS — BP 111/62 | HR 66 | Temp 97.5°F | Resp 18 | Ht 71.0 in | Wt 203.5 lb

## 2022-09-17 DIAGNOSIS — D509 Iron deficiency anemia, unspecified: Secondary | ICD-10-CM

## 2022-09-17 DIAGNOSIS — Z8042 Family history of malignant neoplasm of prostate: Secondary | ICD-10-CM | POA: Diagnosis not present

## 2022-09-17 DIAGNOSIS — C61 Malignant neoplasm of prostate: Secondary | ICD-10-CM | POA: Diagnosis not present

## 2022-09-17 DIAGNOSIS — D649 Anemia, unspecified: Secondary | ICD-10-CM | POA: Diagnosis not present

## 2022-09-17 DIAGNOSIS — C180 Malignant neoplasm of cecum: Secondary | ICD-10-CM

## 2022-09-17 DIAGNOSIS — E611 Iron deficiency: Secondary | ICD-10-CM | POA: Diagnosis not present

## 2022-09-17 DIAGNOSIS — Z801 Family history of malignant neoplasm of trachea, bronchus and lung: Secondary | ICD-10-CM | POA: Diagnosis not present

## 2022-09-17 DIAGNOSIS — Z8041 Family history of malignant neoplasm of ovary: Secondary | ICD-10-CM | POA: Diagnosis not present

## 2022-09-17 NOTE — Progress Notes (Signed)
[place], [place] [address]   CLINIC:  Medical Oncology/Hematology  PCP:  [name], MD [address] [place] [address] [phone]   REASON FOR VISIT:  Follow-up for cecal adenocarcinoma  PRIOR THERAPY: Laparoscopic right hemicolectomy on 09/28/2020  NGS Results: not done  CURRENT THERAPY: Oral iron tablet M/W/F  BRIEF ONCOLOGIC HISTORY:  Oncology History   No history exists.    CANCER STAGING:  Cancer Staging  No matching staging information was found for the patient.  INTERVAL HISTORY:  Mr. SAMER DUTTON, a 89 y.o. male, seen for follow-up of cecal adenocarcinoma and iron deficiency state.  He is taking iron tablet Monday, Wednesday and Friday.  Denies any bleeding per rectum or melena.  No change in bowel habits.  REVIEW OF SYSTEMS:  Review of Systems  Constitutional:  Negative for appetite change and fatigue.  Gastrointestinal:  Negative for blood in stool, constipation and diarrhea.  All other systems reviewed and are negative.   PAST MEDICAL/SURGICAL HISTORY:  Past Medical History:  Diagnosis Date   Abnormal prostate biopsy    Back pain    with radiculopathy   Cataracts, bilateral    Constipation    Family history of lung cancer    Family history of ovarian cancer    Family history of prostate cancer    Goiter    HOH (hard of hearing)    Hyperlipidemia    Hypertrophy of prostate    Malaise and fatigue    Obesity    Pyogenic granuloma    left cheeck   S/P colonoscopy June 2011   rectal bleeding secondary to hemorrhoids   S/P colonoscopy 2007   [facility], diverticulosis per Dr. Juel Burrow note   S/P endoscopy June 2011   Barrett's, mild gastritis and duodenitis   Seborrheic keratosis    Sleep apnea    Past Surgical History:  Procedure Laterality Date   BIOPSY  03/27/2019   Procedure: BIOPSY;  Surgeon: Danie Binder, MD;  [facility];  Service: Endoscopy;;   BIOPSY  08/16/2020    Procedure: BIOPSY;  Surgeon: Eloise Harman, DO;  [facility];  Service: Endoscopy;;   COLONOSCOPY  JUN 2011   PAN-COLONIC DIVERTICULOSIS   COLONOSCOPY  2007   [facility]   COLONOSCOPY WITH PROPOFOL N/A 08/16/2020   Procedure: COLONOSCOPY WITH PROPOFOL;  Surgeon: Eloise Harman, DO;  [facility];  Service: Endoscopy;  Laterality: N/A;  10:45am   ESOPHAGOGASTRODUODENOSCOPY  06/11/2011   Procedure: ESOPHAGOGASTRODUODENOSCOPY (EGD);  Surgeon: Dorothyann Peng, MD;  [facility];  Service: Endoscopy;  Laterality: N/A;  11:00   ESOPHAGOGASTRODUODENOSCOPY N/A 05/21/2014   Dr. Fields:Barrett's esophagus/ Medium sized hiatal hernia. path with indefinite dysplasia.    ESOPHAGOGASTRODUODENOSCOPY N/A 12/20/2014   Dr. Clayburn Pert gastric polyp/large HH/6 cm segment of Barrett's esophagus, path with hyperplastic gastric polyp, no dysplasia. Surveillance April 2017 due    ESOPHAGOGASTRODUODENOSCOPY N/A 01/20/2016   Procedure: ESOPHAGOGASTRODUODENOSCOPY (EGD);  Surgeon: Danie Binder, MD;  [facility];  Service: Endoscopy;  Laterality: N/A;  1230pm   ESOPHAGOGASTRODUODENOSCOPY N/A 03/27/2019   Procedure: ESOPHAGOGASTRODUODENOSCOPY (EGD);  Surgeon: Danie Binder, MD;  [facility];  Service: Endoscopy;  Laterality: N/A;  12:15pm   HERNIA REPAIR     LAPAROSCOPIC PARTIAL COLECTOMY Right 09/28/2020   Procedure: LAPAROSCOPIC RIGHT HEMI COLECTOMY;  Surgeon: Virl Cagey, MD;  Location: [facility];  Service: General;  Laterality: Right;   THYROIDECTOMY N/A 01/23/2021   Procedure: TOTAL THYROIDECTOMY;  Surgeon: Virl Cagey, MD;  Location: [facility];  Service: General;  Laterality: N/A;   UMBILICAL HERNIA REPAIR     VARICOSE VEIN SURGERY      SOCIAL HISTORY:  Social History   Socioeconomic History   Marital status: Divorced    Spouse name: Not on file   Number of children: Not on file   Years of education: Not on file   Highest education  level: Not on file  Occupational History   Not on file  Tobacco Use   Smoking status: Former    Packs/day: 0.50    Years: 2.00    Total pack years: 1.00    Types: Cigarettes    Quit date: 09/10/1963    Years since quitting: 59.0   Smokeless tobacco: Never   Tobacco comments:    quit in 1965  Vaping Use   Vaping Use: Former  Substance and Sexual Activity   Alcohol use: Not Currently   Drug use: No   Sexual activity: Not Currently  Other Topics Concern   Not on file  Social History Narrative   Not on file   Social Determinants of Health   Financial Resource Strain: Not on file  Food Insecurity: Not on file  Transportation Needs: No Transportation Needs (09/06/2020)   PRAPARE - Hydrologist (Medical): No    Lack of Transportation (Non-Medical): No  Physical Activity: Inactive (09/06/2020)   Exercise Vital Sign    Days of Exercise per Week: 0 days    Minutes of Exercise per Session: 0 min  Stress: Not on file  Social Connections: Not on file  Intimate Partner Violence: Not At Risk (09/06/2020)   Humiliation, Afraid, Rape, and Kick questionnaire    Fear of Current or Ex-Partner: No    Emotionally Abused: No    Physically Abused: No    Sexually Abused: No    FAMILY HISTORY:  Family History  Problem Relation Age of Onset   Prostate cancer Father 41       metastatic   Lung cancer Brother 51       heavy smoker   Prostate cancer Paternal Uncle        metastatic   Alzheimer's disease Maternal Grandmother    Prostate cancer Maternal Grandfather 82       metastatic   Prostate cancer Paternal Grandfather 30       metastatic   Ovarian cancer Paternal Aunt 31   Prostate cancer Paternal Uncle        metastatic   Cancer Cousin 81       unknown type, paternal first cousin   Liver cancer Cousin        dx 25s, paternal first cousin   Colon polyps Neg Hx    Colon cancer Neg Hx     CURRENT MEDICATIONS:  Current Outpatient Medications  Medication  Sig Dispense Refill   acetaminophen (TYLENOL) 500 MG tablet Take 500 mg by mouth every 8 (eight) hours as needed for moderate pain.     calcium-vitamin D (OSCAL WITH D) 500-5 MG-MCG tablet Take 1 tablet by mouth in the morning and at bedtime. Lunch and dinner 180 tablet 1   Cholecalciferol (VITAMIN D3) 25 MCG (1000 UT) CAPS Take 1 capsule by mouth 3 (three) times daily.     ferrous sulfate 324 MG TBEC Take 324 mg by mouth every other day.  finasteride (PROSCAR) 5 MG tablet TAKE 1 TABLET DAILY 90 tablet 3   levothyroxine (SYNTHROID) 100 MCG tablet Take 1 tablet (100 mcg total) by mouth daily before breakfast. 90 tablet 3   magnesium oxide (MAG-OX) 400 MG tablet Take 400 mg by mouth 2 (two) times daily.     Multiple Vitamins-Minerals (MULTIVITAMINS THER. W/MINERALS) TABS Take 1 tablet by mouth daily.     ondansetron (ZOFRAN-ODT) 4 MG disintegrating tablet Take 1 tablet (4 mg total) by mouth every 6 (six) hours as needed for nausea. 20 tablet 0   pantoprazole (PROTONIX) 40 MG tablet TAKE 1 TABLET ONCE A DAY BEFORE A MEAL, MAY TAKE SECOND TABLET BEFORE EVENING MEAL IF NEEDED 180 tablet 3   tamsulosin (FLOMAX) 0.4 MG CAPS capsule Take 1 capsule (0.4 mg total) by mouth daily. 90 capsule 3   traMADol (ULTRAM) 50 MG tablet Take 1 tablet (50 mg total) by mouth every 6 (six) hours as needed for moderate pain or severe pain. 30 tablet 0   No current facility-administered medications for this visit.    ALLERGIES:  Allergies  Allergen Reactions   Penicillins     Swelling of hands and feet - severe Did it involve swelling of the face/tongue/throat, SOB, or low BP? No Did it involve sudden or severe rash/hives, skin peeling, or any reaction on the inside of your mouth or nose? No Did you need to seek medical attention at a hospital or doctor's office? No When did it last happen?      1950s If all above answers are "NO", may proceed with cephalosporin use.    Penicillin G Other (See Comments)     PHYSICAL EXAM:  Performance status (ECOG): 0 - Asymptomatic  Vitals:   09/17/22 1420  BP: 111/62  Pulse: 66  Resp: 18  Temp: (!) 97.5 F (36.4 C)  SpO2: 98%   Wt Readings from Last 3 Encounters:  09/17/22 203 lb 7.8 oz (92.3 kg)  09/11/22 207 lb (93.9 kg)  07/19/22 207 lb (93.9 kg)   Physical Exam Vitals reviewed.  Constitutional:      Appearance: Normal appearance.  Cardiovascular:     Rate and Rhythm: Normal rate and regular rhythm.     Pulses: Normal pulses.     Heart sounds: Normal heart sounds.  Pulmonary:     Effort: Pulmonary effort is normal.     Breath sounds: Normal breath sounds.  Abdominal:     Palpations: Abdomen is soft. There is no hepatomegaly, splenomegaly or mass.     Tenderness: There is no abdominal tenderness.  Musculoskeletal:     Right lower leg: No edema.     Left lower leg: No edema.  Neurological:     General: No focal deficit present.     Mental Status: He is alert and oriented to person, place, and time.  Psychiatric:        Mood and Affect: Mood normal.        Behavior: Behavior normal.      LABORATORY DATA:  I have reviewed the labs as listed.     Latest Ref Rng & Units 09/10/2022   12:59 PM 05/28/2022   11:11 AM 02/12/2022    9:50 AM  CBC  WBC 4.0 - 10.5 K/uL 7.0  6.3  8.9   Hemoglobin 13.0 - 17.0 g/dL 13.8  14.3  14.3   Hematocrit 39.0 - 52.0 % 40.9  41.7  42.9   Platelets 150 - 400 K/uL 202  173  183       Latest Ref Rng & Units 09/10/2022   12:59 PM 06/04/2022   11:22 AM 05/28/2022   11:11 AM  CMP  Glucose 70 - 99 mg/dL 88  96  102   BUN 8 - 23 mg/dL '14  17  14   '$ Creatinine 0.61 - 1.24 mg/dL 0.81  0.78  0.81   Sodium 135 - 145 mmol/L 137  143  139   Potassium 3.5 - 5.1 mmol/L 4.1  4.7  4.3   Chloride 98 - 111 mmol/L 104  104  106   CO2 22 - 32 mmol/L '27  24  27   '$ Calcium 8.9 - 10.3 mg/dL 8.6  9.1  8.8   Total Protein 6.5 - 8.1 g/dL 6.6  6.6  6.7   Total Bilirubin 0.3 - 1.2 mg/dL 1.4  1.3  1.9   Alkaline Phos 38 -  126 U/L 50  71  59   AST 15 - 41 U/L '24  22  26   '$ ALT 0 - 44 U/L '20  16  21     '$ DIAGNOSTIC IMAGING:  I have independently reviewed the scans and discussed with the patient. CT Abdomen Pelvis W Contrast  Result Date: 09/11/2022 CLINICAL DATA:  Cecal adenocarcinoma. Previous right colectomy. * Tracking Code: BO * EXAM: CT ABDOMEN AND PELVIS WITH CONTRAST TECHNIQUE: Multidetector CT imaging of the abdomen and pelvis was performed using the standard protocol following bolus administration of intravenous contrast. RADIATION DOSE REDUCTION: This exam was performed according to the departmental dose-optimization program which includes automated exposure control, adjustment of the mA and/or kV according to patient size and/or use of iterative reconstruction technique. CONTRAST:  162m OMNIPAQUE IOHEXOL 300 MG/ML  SOLN COMPARISON:  02/12/2022 FINDINGS: Lower Chest: No acute findings. Hepatobiliary: No hepatic masses identified. Stable small left hepatic lobe cyst noted. Gallbladder is unremarkable. No evidence of biliary ductal dilatation. Pancreas:  No mass or inflammatory changes. Spleen: Within normal limits in size and appearance. Adrenals/Urinary Tract: No suspicious masses identified. No evidence of ureteral calculi or hydronephrosis. Stomach/Bowel: Tiny hiatal hernia noted which is decreased in size. Postop changes seen from previous right colectomy. No mass identified. Diffuse colonic diverticulosis is again seen, however there is no evidence of diverticulitis. No evidence of obstruction, inflammatory process or abnormal fluid collections. Vascular/Lymphatic: No pathologically enlarged lymph nodes. No acute vascular findings. Aortic atherosclerotic calcification incidentally noted. Reproductive:  Stable moderately enlarged prostate gland. Other: Moderate to large right inguinal hernia and small left inguinal hernia show no significant change, both containing only fat. Musculoskeletal: No suspicious bone  lesions identified. Old right posterior rib fracture deformities again noted. IMPRESSION: No evidence of recurrent or metastatic carcinoma within the abdomen or pelvis. No acute findings. Colonic diverticulosis, without radiographic evidence of diverticulitis. Stable moderately enlarged prostate. Stable bilateral inguinal hernias, right greater than left, both containing only fat. Aortic Atherosclerosis (ICD10-I70.0). Electronically Signed   By: JMarlaine HindM.D.   On: 09/11/2022 09:05     ASSESSMENT:  1.  Cecal adenocarcinoma: -Colonoscopy by Dr. CAbbey Chatterson 08/16/2020 with fungating/infiltrative submucosal and ulcerated partially obstructing mass found in the cecum and at the ileocecal valve. -Biopsy consistent with adenocarcinoma, MMR preserved. -CT CAP on 08/22/2020 with cycle carcinoma with small ileocolic mesenteric lymph nodes with no evidence of distant metastatic spread.   2. Social/family history: -He is retired from [employer] He had asbestos exposure. -Maternal and paternal grandfathers died of prostate cancer. Father died of prostate cancer. 2  paternal uncles had prostate cancer.   PLAN:  1.  Stage II (PT3PN0) cecal adenocarcinoma, MMR preserved: - He denies any change in bowel habits or bleeding per rectum. - Reviewed labs from 09/10/2022 which showed normal LFTs.  Total bilirubin 1.4 and stable.  CBC was grossly normal.  CEA was 2.6. - CTAP on 09/10/2022 with no evidence of recurrence or metastatic cancer.  Other benign findings discussed. - RTC 6 months for follow-up with repeat labs.  2. Prostate cancer: - Last PSA is 2.6.  Continue follow-up with Dr. Roni Bread.   3. Family history: - Genetic testing was recommended due to extensive family history of which she declined.   4. Normocytic anemia: - Continue iron tablet Monday, Wednesday and Friday.  Ferritin is 37.   Orders placed this encounter:  Orders Placed This Encounter  Procedures   CBC with Differential/Platelet   Comprehensive  metabolic panel   CEA   Ferritin   Iron and TIBC      Derek Jack, MD [place] [phone]

## 2022-09-17 NOTE — Patient Instructions (Signed)
[facility]  Discharge Instructions  You were seen and examined today by Dr. Katragadda.  Dr. Katragadda discussed your most recent lab work which revealed that everything looks good.  Follow-up as scheduled in 6 months.    Thank you for choosing [facility] [place] to provide your oncology and hematology care.   To afford each patient quality time with our provider, please arrive at least 15 minutes before your scheduled appointment time. You may need to reschedule your appointment if you arrive late (10 or more minutes). Arriving late affects you and other patients whose appointments are after yours.  Also, if you miss three or more appointments without notifying the office, you may be dismissed from the clinic at the provider's discretion.    Again, thank you for choosing [facility].  Our hope is that these requests will decrease the amount of time that you wait before being seen by our physicians.   If you have a lab appointment with the Cancer Center please come in thru the Main Entrance and check in at the main information desk.           _____________________________________________________________  Should you have questions after your visit to [facility], please contact our office at [phone] and follow the prompts.  Our office hours are 8:00 a.m. to 4:30 p.m. Monday - Thursday and 8:00 a.m. to 2:30 p.m. Friday.  Please note that voicemails left after 4:00 p.m. may not be returned until the following business day.  We are closed weekends and all major holidays.  You do have access to a nurse 24-7, just call the main number to the clinic [phone] and do not press any options, hold on the line and a nurse will answer the phone.    For prescription refill requests, have your pharmacy contact our office and allow 72 hours.    Masks are optional in the cancer centers. If you would like for your care team  to wear a mask while they are taking care of you, please let them know. You may have one support person who is at least 87 years old accompany you for your appointments.  

## 2022-09-18 ENCOUNTER — Encounter (HOSPITAL_COMMUNITY): Payer: Self-pay

## 2022-09-18 ENCOUNTER — Encounter (HOSPITAL_COMMUNITY)
Admission: RE | Admit: 2022-09-18 | Discharge: 2022-09-18 | Disposition: A | Discharge status: Home / Self Care | Payer: Medicare Other | Source: Ambulatory Visit | Attending: General Surgery | Admitting: General Surgery

## 2022-09-18 DIAGNOSIS — Z01818 Encounter for other preprocedural examination: Secondary | ICD-10-CM | POA: Diagnosis not present

## 2022-09-18 NOTE — Patient Instructions (Signed)
Jerry Macdonald  09/18/2022     '@[email or web address]'$ @   Your procedure is scheduled on 09/20/2022.  Report to [facility] at 7:00 A.M.  Call this number if you have problems the morning of surgery:  [phone]  If you experience any cold or flu symptoms such as cough, fever, chills, shortness of breath, etc. between now and your scheduled surgery, please notify us at the above number.   Remember:  Do not eat or drink after midnight.     Take these medicines the morning of surgery with A SIP OF WATER : Levothyroxine, Pantoprazole,   Flomax.Marland KitchenMarland KitchenMarland KitchenTramadol and Zofran if needed    Do not wear jewelry, make-up or nail polish.  Do not wear lotions, powders, or perfumes, or deodorant.  Do not shave 48 hours prior to surgery.  Men may shave face and neck.  Do not bring valuables to the hospital.  [facility] is not responsible for any belongings or valuables.  Contacts, dentures or bridgework may not be worn into surgery.  Leave your suitcase in the car.  After surgery it may be brought to your room.  For patients admitted to the hospital, discharge time will be determined by your treatment team.  Patients discharged the day of surgery will not be allowed to drive home.   Name and phone number of your driver:   Family Special instructions:  N/A  Please read over the following fact sheets that you were given. Care and Recovery After Surgery  Laparoscopic Ventral Hernia Repair Laparoscopic ventral hernia repairis a procedure to fix a bulge of tissue that pushes through a weak area of muscle in the abdomen (ventral hernia). A ventral hernia may be: Above the belly button. This is called an epigastric hernia. At the belly button. This is called an umbilical hernia. At the incision site from previous abdominal surgery. This is called an incisional hernia. You may have this procedure as emergency surgery if part of your intestine gets trapped inside the hernia and starts to lose its blood  supply (strangulation). Laparoscopic surgery is done through small incisions using a thin surgical telescope with a light and camera (laparoscope). During surgery, your surgeon will use images from the laparoscope to guide the procedure. A mesh screen will be placed in the hernia to close the opening and strengthen the abdominal wall. Tell a health care provider about: Any allergies you have. All medicines you are taking, including vitamins, herbs, eye drops, creams, and over-the-counter medicines. Any problems you or family members have had with anesthetic medicines. Any blood disorders you have. Any surgeries you have had. Any medical conditions you have. Whether you are pregnant or may be pregnant. What are the risks? Generally, this is a safe procedure. However, problems may occur, including: Infection. Bleeding. Damage to nearby structures or organs in the abdomen. Trouble urinating or having a bowel movement after surgery. Blood clots. The hernia coming back after surgery. Fluid buildup in the area of the hernia. In some cases, your health care provider may need to switch from a laparoscopic procedure to a procedure that is done through a single, larger incision in the abdomen (open procedure). You may need an open procedure if: You have a hernia that is difficult to repair. Your organs are hard to see with the laparoscope. You have bleeding problems during the laparoscopic procedure. What happens before the procedure? Staying hydrated Follow instructions from your health care provider about hydration, which may include: Up to 2 hours before  the procedure - you may continue to drink clear liquids, such as water, clear fruit juice, black coffee, and plain tea.  Eating and drinking restrictions Follow instructions from your health care provider about eating and drinking, which may include: 8 hours before the procedure - stop eating heavy meals or foods, such as meat, fried foods, or  fatty foods. 6 hours before the procedure - stop eating light meals or foods, such as toast or cereal. 6 hours before the procedure - stop drinking milk or drinks that contain milk. 2 hours before the procedure - stop drinking clear liquids. Medicines Ask your health care provider about: Changing or stopping your regular medicines. This is especially important if you are taking diabetes medicines or blood thinners. Taking medicines such as aspirin and ibuprofen. These medicines can thin your blood. Do not take these medicines unless your health care provider tells you to take them. Taking over-the-counter medicines, vitamins, herbs, and supplements. Tests You may need to have tests before the procedure, such as: Blood tests. Urine tests. Abdominal ultrasound. Chest X-ray. Electrocardiogram (ECG). General instructions You may be asked to take a laxative or do an enema to empty your bowel before surgery (bowel prep). Do not use any products that contain nicotine or tobacco for at least 4 weeks before the procedure. These products include cigarettes, chewing tobacco, and vaping devices, such as e-cigarettes. If you need help quitting, ask your health care provider. Ask your health care provider: How your surgery site will be marked. What steps will be taken to help prevent infection. These steps may include: Removing hair at the surgery site. Washing skin with a germ-killing soap. Receiving antibiotic medicine. Plan to have a responsible adult take you home from the hospital or clinic. If you will be going home right after the procedure, plan to have a responsible adult care for you for the time you are told. This is important. What happens during the procedure?  An IV will be inserted into one of your veins. You will be given one or more of the following: A medicine to help you relax (sedative). A medicine to numb the area (local anesthetic). A medicine to make you fall asleep (general  anesthetic). A small incision will be made in your abdomen. A hollow metal tube (trocar) will be placed through the incision. A tube will be placed through the trocar to inflate your abdomen with carbon dioxide. This makes it easier for your surgeon to see inside your abdomen during the repair. A laparoscope will be inserted into your abdomen through the trocar. The laparoscope will send images to a monitor in the operating room. Other trocars will be put through other small incisions in your abdomen. The surgical instruments needed for the procedure will be placed through these trocars. The tissue or intestines that make up the hernia will be moved back into place. The edges of the hernia may be stitched (sutured) together. A piece of mesh will be used to close the hernia. Sutures, clips, or staples will be used to keep the mesh in place. A bandage (dressing) or skin glue will be put over the incisions. The procedure may vary among health care providers and hospitals. What happens after the procedure? Your blood pressure, heart rate, breathing rate, and blood oxygen level will be monitored until you leave the hospital or clinic. You will continue to receive fluids and medicines through an IV. Your IV will be removed when you can drink clear fluids. You will be  given pain medicine as needed. You will be encouraged to get up and walk around as soon as possible. You will be shown how to do deep breathing exercises to help prevent a lung infection. If you were given a sedative during the procedure, it can affect you for several hours. Do not drive or operate machinery until your health care provider says that it is safe. Summary Laparoscopic ventral hernia is an operation to fix a hernia using small incisions. Tell your health care provider about other medical conditions that you have and about all the medicines that you are taking. Follow instructions from your health care provider about eating and  drinking before the procedure. Plan to have a responsible adult take you home from the hospital or clinic. After the procedure, you will be encouraged to walk as soon as possible. You will also be taught how to do deep breathing exercises. This information is not intended to replace advice given to you by your health care provider. Make sure you discuss any questions you have with your health care provider. Document Revised: 04/08/2020 Document Reviewed: 04/08/2020 Elsevier Patient Education  [place] Anesthesia, Adult General anesthesia is the use of medicine to make you fall asleep (unconscious) for a medical procedure. General anesthesia must be used for certain procedures. It is often recommended for surgery or procedures that: Last a long time. Require you to be still or in an unusual position. Are major and can cause blood loss. Affect your breathing. The medicines used for general anesthesia are called general anesthetics. During general anesthesia, these medicines are given along with medicines that: Prevent pain. Control your blood pressure. Relax your muscles. Prevent nausea and vomiting after the procedure. Tell a health care provider about: Any allergies you have. All medicines you are taking, including vitamins, herbs, eye drops, creams, and over-the-counter medicines. Your history of any: Medical conditions you have, including: High blood pressure. Bleeding problems. Diabetes. Heart or lung conditions, such as: Heart failure. Sleep apnea. Asthma. Chronic obstructive pulmonary disease (COPD). Current or recent illnesses, such as: Upper respiratory, chest, or ear infections. Cough or fever. Tobacco or drug use, including marijuana or alcohol use. Depression or anxiety. Surgeries and types of anesthetics you have had. Problems you or family members have had with anesthetic medicines. Whether you are pregnant or may be pregnant. Whether you have  any chipped or loose teeth, dentures, caps, bridgework, or issues with your mouth, swallowing, or choking. What are the risks? Your health care provider will talk with you about risks. These may include: Allergic reaction to the medicines. Lung and heart problems. Inhaling food or liquid from the stomach into the lungs (aspiration). Nerve injury. Injury to the lips, mouth, teeth, or gums. Stroke. Waking up during your procedure and being unable to move. This is rare. These problems are more likely to develop if you are having a major surgery or if you have an advanced or serious medical condition. You can prevent some of these complications by answering all of your health care provider's questions thoroughly and by following all instructions before your procedure. General anesthesia can cause side effects, including: Nausea or vomiting. A sore throat or hoarseness from the breathing tube. Wheezing or coughing. Shaking chills or feeling cold. Body aches. Sleepiness. Confusion, agitation (delirium), or anxiety. What happens before the procedure? When to stop eating and drinking Follow instructions from your health care provider about what you may eat and drink before your procedure. If you do  not follow your health care provider's instructions, your procedure may be delayed or canceled. Medicines Ask your health care provider about: Changing or stopping your regular medicines. These include any diabetes medicines or blood thinners you take. Taking medicines such as aspirin and ibuprofen. These medicines can thin your blood. Do not take them unless your health care provider tells you to. Taking over-the-counter medicines, vitamins, herbs, and supplements. General instructions Do not use any products that contain nicotine or tobacco for at least 4 weeks before the procedure. These products include cigarettes, chewing tobacco, and vaping devices, such as e-cigarettes. If you need help  quitting, ask your health care provider. If you brush your teeth on the morning of the procedure, make sure to spit out all of the water and toothpaste. If told by your health care provider, bring your sleep apnea device with you to surgery (if applicable). If you will be going home right after the procedure, plan to have a responsible adult: Take you home from the hospital or clinic. You will not be allowed to drive. Care for you for the time you are told. What happens during the procedure?  An IV will be inserted into one of your veins. You will be given one or more of the following through a face mask or IV: A sedative. This helps you relax. Anesthesia. This will: Numb certain areas of your body. Make you fall asleep for surgery. After you are unconscious, a breathing tube may be inserted down your throat to help you breathe. This will be removed before you wake up. An anesthesia provider, such as an anesthesiologist, will stay with you throughout your procedure. The anesthesia provider will: Keep you comfortable and safe by continuing to give you medicines and adjusting the amount of medicine that you get. Monitor your blood pressure, heart rate, and oxygen levels to make sure that the anesthetics do not cause any problems. The procedure may vary among health care providers and hospitals. What happens after the procedure? Your blood pressure, temperature, heart rate, breathing rate, and blood oxygen level will be monitored until you leave the hospital or clinic. You will wake up in a recovery area. You may wake up slowly. You may be given medicine to help you with pain, nausea, or any other side effects from the anesthesia. Summary General anesthesia is the use of medicine to make you fall asleep (unconscious) for a medical procedure. Follow your health care provider's instructions about when to stop eating, drinking, or taking certain medicines before your procedure. Plan to have a  responsible adult take you home from the hospital or clinic. This information is not intended to replace advice given to you by your health care provider. Make sure you discuss any questions you have with your health care provider. Document Revised: 11/16/2021 Document Reviewed: 11/16/2021 Elsevier Patient Education  [employer].  How to Use Chlorhexidine Before Surgery Chlorhexidine gluconate (CHG) is a germ-killing (antiseptic) solution that is used to clean the skin. It can get rid of the bacteria that normally live on the skin and can keep them away for about 24 hours. To clean your skin with CHG, you may be given: A CHG solution to use in the shower or as part of a sponge bath. A prepackaged cloth that contains CHG. Cleaning your skin with CHG may help lower the risk for infection: While you are staying in the intensive care unit of the hospital. If you have a vascular access, such as a central  line, to provide short-term or long-term access to your veins. If you have a catheter to drain urine from your bladder. If you are on a ventilator. A ventilator is a machine that helps you breathe by moving air in and out of your lungs. After surgery. What are the risks? Risks of using CHG include: A skin reaction. Hearing loss, if CHG gets in your ears and you have a perforated eardrum. Eye injury, if CHG gets in your eyes and is not rinsed out. The CHG product catching fire. Make sure that you avoid smoking and flames after applying CHG to your skin. Do not use CHG: If you have a chlorhexidine allergy or have previously reacted to chlorhexidine. On babies younger than 39 months of age. How to use CHG solution Use CHG only as told by your health care provider, and follow the instructions on the label. Use the full amount of CHG as directed. Usually, this is one bottle. During a shower Follow these steps when using CHG solution during a shower (unless your health care provider gives you  different instructions): Start the shower. Use your normal soap and shampoo to wash your face and hair. Turn off the shower or move out of the shower stream. Pour the CHG onto a clean washcloth. Do not use any type of brush or rough-edged sponge. Starting at your neck, lather your body down to your toes. Make sure you follow these instructions: If you will be having surgery, pay special attention to the part of your body where you will be having surgery. Scrub this area for at least 1 minute. Do not use CHG on your head or face. If the solution gets into your ears or eyes, rinse them well with water. Avoid your genital area. Avoid any areas of skin that have broken skin, cuts, or scrapes. Scrub your back and under your arms. Make sure to wash skin folds. Let the lather sit on your skin for 1-2 minutes or as long as told by your health care provider. Thoroughly rinse your entire body in the shower. Make sure that all body creases and crevices are rinsed well. Dry off with a clean towel. Do not put any substances on your body afterward--such as powder, lotion, or perfume--unless you are told to do so by your health care provider. Only use lotions that are recommended by the manufacturer. Put on clean clothes or pajamas. If it is the night before your surgery, sleep in clean sheets.  During a sponge bath Follow these steps when using CHG solution during a sponge bath (unless your health care provider gives you different instructions): Use your normal soap and shampoo to wash your face and hair. Pour the CHG onto a clean washcloth. Starting at your neck, lather your body down to your toes. Make sure you follow these instructions: If you will be having surgery, pay special attention to the part of your body where you will be having surgery. Scrub this area for at least 1 minute. Do not use CHG on your head or face. If the solution gets into your ears or eyes, rinse them well with water. Avoid your  genital area. Avoid any areas of skin that have broken skin, cuts, or scrapes. Scrub your back and under your arms. Make sure to wash skin folds. Let the lather sit on your skin for 1-2 minutes or as long as told by your health care provider. Using a different clean, wet washcloth, thoroughly rinse your entire body. Make  sure that all body creases and crevices are rinsed well. Dry off with a clean towel. Do not put any substances on your body afterward--such as powder, lotion, or perfume--unless you are told to do so by your health care provider. Only use lotions that are recommended by the manufacturer. Put on clean clothes or pajamas. If it is the night before your surgery, sleep in clean sheets. How to use CHG prepackaged cloths Only use CHG cloths as told by your health care provider, and follow the instructions on the label. Use the CHG cloth on clean, dry skin. Do not use the CHG cloth on your head or face unless your health care provider tells you to. When washing with the CHG cloth: Avoid your genital area. Avoid any areas of skin that have broken skin, cuts, or scrapes. Before surgery Follow these steps when using a CHG cloth to clean before surgery (unless your health care provider gives you different instructions): Using the CHG cloth, vigorously scrub the part of your body where you will be having surgery. Scrub using a back-and-forth motion for 3 minutes. The area on your body should be completely wet with CHG when you are done scrubbing. Do not rinse. Discard the cloth and let the area air-dry. Do not put any substances on the area afterward, such as powder, lotion, or perfume. Put on clean clothes or pajamas. If it is the night before your surgery, sleep in clean sheets.  For general bathing Follow these steps when using CHG cloths for general bathing (unless your health care provider gives you different instructions). Use a separate CHG cloth for each area of your body. Make  sure you wash between any folds of skin and between your fingers and toes. Wash your body in the following order, switching to a new cloth after each step: The front of your neck, shoulders, and chest. Both of your arms, under your arms, and your hands. Your stomach and groin area, avoiding the genitals. Your right leg and foot. Your left leg and foot. The back of your neck, your back, and your buttocks. Do not rinse. Discard the cloth and let the area air-dry. Do not put any substances on your body afterward--such as powder, lotion, or perfume--unless you are told to do so by your health care provider. Only use lotions that are recommended by the manufacturer. Put on clean clothes or pajamas. Contact a health care provider if: Your skin gets irritated after scrubbing. You have questions about using your solution or cloth. You swallow any chlorhexidine. Call your local poison control center (1-(239) 108-5370 in the [place] Get help right away if: Your eyes itch badly, or they become very red or swollen. Your skin itches badly and is red or swollen. Your hearing changes. You have trouble seeing. You have swelling or tingling in your mouth or throat. You have trouble breathing. These symptoms may represent a serious problem that is an emergency. Do not wait to see if the symptoms will go away. Get medical help right away. Call your local emergency services (911 in the [place] Do not drive yourself to the hospital. Summary Chlorhexidine gluconate (CHG) is a germ-killing (antiseptic) solution that is used to clean the skin. Cleaning your skin with CHG may help to lower your risk for infection. You may be given CHG to use for bathing. It may be in a bottle or in a prepackaged cloth to use on your skin. Carefully follow your health care provider's instructions and the instructions on  the product label. Do not use CHG if you have a chlorhexidine allergy. Contact your health care provider if your skin  gets irritated after scrubbing. This information is not intended to replace advice given to you by your health care provider. Make sure you discuss any questions you have with your health care provider. Document Revised: 12/18/2021 Document Reviewed: 10/31/2020 Elsevier Patient Education  [place].

## 2022-09-20 ENCOUNTER — Other Ambulatory Visit: Payer: Self-pay

## 2022-09-20 ENCOUNTER — Ambulatory Visit (HOSPITAL_COMMUNITY)
Admission: RE | Admit: 2022-09-20 | Discharge: 2022-09-20 | Disposition: A | Discharge status: Home / Self Care | Payer: Medicare Other | Attending: General Surgery | Admitting: General Surgery

## 2022-09-20 ENCOUNTER — Ambulatory Visit (HOSPITAL_COMMUNITY): Payer: Medicare Other | Admitting: Anesthesiology

## 2022-09-20 ENCOUNTER — Encounter (HOSPITAL_COMMUNITY): Payer: Self-pay | Admitting: General Surgery

## 2022-09-20 ENCOUNTER — Ambulatory Visit (HOSPITAL_BASED_OUTPATIENT_CLINIC_OR_DEPARTMENT_OTHER): Payer: Medicare Other | Admitting: Anesthesiology

## 2022-09-20 ENCOUNTER — Encounter (HOSPITAL_COMMUNITY)
Admission: RE | Disposition: A | Discharge status: Home / Self Care | Payer: Self-pay | Source: Home / Self Care | Attending: General Surgery

## 2022-09-20 DIAGNOSIS — Z9049 Acquired absence of other specified parts of digestive tract: Secondary | ICD-10-CM | POA: Diagnosis not present

## 2022-09-20 DIAGNOSIS — C180 Malignant neoplasm of cecum: Secondary | ICD-10-CM | POA: Insufficient documentation

## 2022-09-20 DIAGNOSIS — G4730 Sleep apnea, unspecified: Secondary | ICD-10-CM | POA: Insufficient documentation

## 2022-09-20 DIAGNOSIS — K439 Ventral hernia without obstruction or gangrene: Secondary | ICD-10-CM | POA: Diagnosis not present

## 2022-09-20 DIAGNOSIS — Z87891 Personal history of nicotine dependence: Secondary | ICD-10-CM | POA: Insufficient documentation

## 2022-09-20 DIAGNOSIS — E039 Hypothyroidism, unspecified: Secondary | ICD-10-CM | POA: Insufficient documentation

## 2022-09-20 DIAGNOSIS — K436 Other and unspecified ventral hernia with obstruction, without gangrene: Secondary | ICD-10-CM | POA: Diagnosis not present

## 2022-09-20 DIAGNOSIS — K432 Incisional hernia without obstruction or gangrene: Secondary | ICD-10-CM | POA: Diagnosis not present

## 2022-09-20 DIAGNOSIS — K219 Gastro-esophageal reflux disease without esophagitis: Secondary | ICD-10-CM | POA: Insufficient documentation

## 2022-09-20 HISTORY — PX: VENTRAL HERNIA REPAIR: SHX424

## 2022-09-20 SURGERY — REPAIR, HERNIA, VENTRAL
Anesthesia: General | Site: Abdomen

## 2022-09-20 MED ORDER — MIDAZOLAM HCL 2 MG/2ML IJ SOLN
INTRAMUSCULAR | Status: AC
  Filled 2022-09-20: qty 2

## 2022-09-20 MED ORDER — PHENYLEPHRINE 80 MCG/ML (10ML) SYRINGE FOR IV PUSH (FOR BLOOD PRESSURE SUPPORT)
PREFILLED_SYRINGE | INTRAVENOUS | Status: DC | PRN
  Administered 2022-09-20 (×7): 80 ug via INTRAVENOUS

## 2022-09-20 MED ORDER — ONDANSETRON HCL 4 MG/2ML IJ SOLN: 4.0000 mg | Freq: Once | INTRAMUSCULAR | Status: DC | PRN

## 2022-09-20 MED ORDER — CHLORHEXIDINE GLUCONATE 0.12 % MT SOLN
OROMUCOSAL | Status: AC
  Filled 2022-09-20: qty 15

## 2022-09-20 MED ORDER — ROCURONIUM BROMIDE 100 MG/10ML IV SOLN
INTRAVENOUS | Status: DC | PRN
  Administered 2022-09-20: 60 mg via INTRAVENOUS

## 2022-09-20 MED ORDER — CHLORHEXIDINE GLUCONATE CLOTH 2 % EX PADS: 6.0000 | MEDICATED_PAD | Freq: Once | CUTANEOUS | Status: DC

## 2022-09-20 MED ORDER — BUPIVACAINE LIPOSOME 1.3 % IJ SUSP
INTRAMUSCULAR | Status: DC | PRN
  Administered 2022-09-20: 20 mL

## 2022-09-20 MED ORDER — CEFAZOLIN SODIUM-DEXTROSE 2-4 GM/100ML-% IV SOLN
INTRAVENOUS | Status: AC
  Filled 2022-09-20: qty 100

## 2022-09-20 MED ORDER — 0.9 % SODIUM CHLORIDE (POUR BTL) OPTIME
TOPICAL | Status: DC | PRN
  Administered 2022-09-20: 1000 mL

## 2022-09-20 MED ORDER — FENTANYL CITRATE (PF) 250 MCG/5ML IJ SOLN
INTRAMUSCULAR | Status: AC
  Filled 2022-09-20: qty 5

## 2022-09-20 MED ORDER — SUGAMMADEX SODIUM 200 MG/2ML IV SOLN
INTRAVENOUS | Status: DC | PRN
  Administered 2022-09-20: 200 mg via INTRAVENOUS

## 2022-09-20 MED ORDER — TRAMADOL HCL 50 MG PO TABS: 50.0000 mg | ORAL_TABLET | Freq: Four times a day (QID) | ORAL | 0 refills | Status: AC | PRN

## 2022-09-20 MED ORDER — DEXAMETHASONE SODIUM PHOSPHATE 10 MG/ML IJ SOLN
INTRAMUSCULAR | Status: DC | PRN
  Administered 2022-09-20: 10 mg via INTRAVENOUS

## 2022-09-20 MED ORDER — MIDAZOLAM HCL 5 MG/5ML IJ SOLN
INTRAMUSCULAR | Status: DC | PRN
  Administered 2022-09-20: 1 mg via INTRAVENOUS

## 2022-09-20 MED ORDER — BUPIVACAINE LIPOSOME 1.3 % IJ SUSP
INTRAMUSCULAR | Status: AC
  Filled 2022-09-20: qty 20

## 2022-09-20 MED ORDER — HYDROCODONE-ACETAMINOPHEN 7.5-325 MG PO TABS: 1.0000 | ORAL_TABLET | Freq: Once | ORAL | Status: DC | PRN

## 2022-09-20 MED ORDER — ORAL CARE MOUTH RINSE: 15.0000 mL | Freq: Once | OROMUCOSAL | Status: AC

## 2022-09-20 MED ORDER — HYDROMORPHONE HCL 1 MG/ML IJ SOLN: 0.2500 mg | INTRAMUSCULAR | Status: DC | PRN

## 2022-09-20 MED ORDER — CEFAZOLIN SODIUM-DEXTROSE 2-3 GM-%(50ML) IV SOLR
INTRAVENOUS | Status: DC | PRN
  Administered 2022-09-20: 2 g via INTRAVENOUS

## 2022-09-20 MED ORDER — FENTANYL CITRATE (PF) 100 MCG/2ML IJ SOLN
INTRAMUSCULAR | Status: DC | PRN
  Administered 2022-09-20: 25 ug via INTRAVENOUS
  Administered 2022-09-20 (×2): 50 ug via INTRAVENOUS

## 2022-09-20 MED ORDER — LACTATED RINGERS IV SOLN: INTRAVENOUS | Status: DC

## 2022-09-20 MED ORDER — ONDANSETRON HCL 4 MG/2ML IJ SOLN
INTRAMUSCULAR | Status: DC | PRN
  Administered 2022-09-20: 4 mg via INTRAVENOUS

## 2022-09-20 MED ORDER — PHENYLEPHRINE 80 MCG/ML (10ML) SYRINGE FOR IV PUSH (FOR BLOOD PRESSURE SUPPORT)
PREFILLED_SYRINGE | INTRAVENOUS | Status: AC
  Filled 2022-09-20: qty 10

## 2022-09-20 MED ORDER — CHLORHEXIDINE GLUCONATE 0.12 % MT SOLN
15.0000 mL | Freq: Once | OROMUCOSAL | Status: AC
  Administered 2022-09-20: 15 mL via OROMUCOSAL

## 2022-09-20 MED ORDER — PROPOFOL 10 MG/ML IV BOLUS
INTRAVENOUS | Status: DC | PRN
  Administered 2022-09-20: 150 mg via INTRAVENOUS

## 2022-09-20 SURGICAL SUPPLY — 31 items
ADH SKN CLS APL DERMABOND .7 (GAUZE/BANDAGES/DRESSINGS) ×1
APL PRP STRL LF DISP 70% ISPRP (MISCELLANEOUS) ×1
CHLORAPREP W/TINT 26 (MISCELLANEOUS) ×1 IMPLANT
CLOTH BEACON ORANGE TIMEOUT ST (SAFETY) ×1 IMPLANT
COVER LIGHT HANDLE STERIS (MISCELLANEOUS) ×2 IMPLANT
DERMABOND ADVANCED .7 DNX12 (GAUZE/BANDAGES/DRESSINGS) IMPLANT
ELECT REM PT RETURN 9FT ADLT (ELECTROSURGICAL) ×1
ELECTRODE REM PT RTRN 9FT ADLT (ELECTROSURGICAL) ×1 IMPLANT
GLOVE BIO SURGEON STRL SZ 6 (GLOVE) IMPLANT
GLOVE BIO SURGEON STRL SZ 6.5 (GLOVE) ×1 IMPLANT
GLOVE BIO SURGEON STRL SZ7 (GLOVE) IMPLANT
GLOVE BIOGEL PI IND STRL 6.5 (GLOVE) ×1 IMPLANT
GLOVE BIOGEL PI IND STRL 7.0 (GLOVE) ×2 IMPLANT
GLOVE ECLIPSE 6.5 STRL STRAW (GLOVE) IMPLANT
GLOVE SURG SS PI 7.0 STRL IVOR (GLOVE) IMPLANT
GOWN STRL REUS W/TWL LRG LVL3 (GOWN DISPOSABLE) ×2 IMPLANT
INST SET MAJOR GENERAL (KITS) ×1 IMPLANT
KIT TURNOVER KIT A (KITS) ×1 IMPLANT
MANIFOLD NEPTUNE II (INSTRUMENTS) ×1 IMPLANT
MESH VENTRALEX ST 8CM LRG (Mesh General) IMPLANT
NDL HYPO 21X1.5 SAFETY (NEEDLE) ×1 IMPLANT
NEEDLE HYPO 21X1.5 SAFETY (NEEDLE) ×1 IMPLANT
NS IRRIG 1000ML POUR BTL (IV SOLUTION) ×1 IMPLANT
PACK MAJOR ABDOMINAL (CUSTOM PROCEDURE TRAY) ×1 IMPLANT
PAD ARMBOARD 7.5X6 YLW CONV (MISCELLANEOUS) ×1 IMPLANT
SET BASIN LINEN APH (SET/KITS/TRAYS/PACK) ×1 IMPLANT
SUT ETHIBOND NAB MO 7 #0 18IN (SUTURE) IMPLANT
SUT MNCRL AB 4-0 PS2 18 (SUTURE) IMPLANT
SUT VIC AB 3-0 SH 27 (SUTURE) ×2
SUT VIC AB 3-0 SH 27X BRD (SUTURE) IMPLANT
SYR 20ML LL LF (SYRINGE) ×2 IMPLANT

## 2022-09-20 NOTE — Op Note (Signed)
[facility] Operative Note  09/20/22  Preoperative Diagnosis: Ventral hernia    Postoperative Diagnosis: Same   Procedure(s) Performed: Ventral hernia repair with mesh (3cm defect)    Surgeon: Lanell Matar. Constance Haw, MD   Assistants: No qualified resident was available    Anesthesia: General endotracheal   Anesthesiologist: Dr. Delma Freeze    Specimens: None    Estimated Blood Loss: Minimal   Blood Replacement: None    Complications: None   Wound Class: Clean    Operative Indications: Mr. Tiegs is a 89 yo with two ventral hernia defects on CT scan from his midline incision from his hemicolectomy. We discussed repair with mesh and risk of bleeding, infection, injury to the bowel, recurrence, and he opted to proceed.    Findings:  2 defects, total size 3cm    Procedure: The patient was taken to the operating room and placed supine. General endotracheal anesthesia was induced. Intravenous antibiotics were  administered per protocol.  The abdomen was prepared and draped in the usual sterile fashion.   The ventral hernia was noted to be reducible and was just right of midline and a second defect was just superior to that. A midline incision was made at the level of these defects, and carried down through the subcutaneous tissue with electrocautery and great care given the thinness of the patient's abdominal wall.  Dissection was performed down to the level of the fascia, exposing the hernia sac.  The hernia sac was opened with care, and excess hernia sac was resected with electrocautery.  A finger was ran on the underlying peritoneum and the second defect was noted just superior as it had been on the CT. The thin fascia Jovaun Levene was opened and the remaining hernia sac was dissected off with cautery. The final defect size was 3cm.  A 8 cm Ventralex St Hernia Patch was placed and secured with 0 Ethibond around the entire circumference of the mesh ensuring that it was against the  peritoneal cavity.  The hernia defect was then closed with 0 Ethibond suture in an interrupted fashion over the patch.  The umbilical stalk had not be disrupted as this was right of midline. Hemostasis was confirmed. The subcutaneous tissue was closed with interrupted 3-0 Vicryl to given another layer between the skin and the mesh given the thinness of his tissue. The skin was closed with a running 4-0 Monocryl suture and dermabond.     All counts were correct at the end of the case. The patient was awakened from anesthesia and extubated without complication.  The patient went to the PACU in stable condition.  Curlene Labrum, MD [facility] [address] [place], [place] [address] [phone] (office)

## 2022-09-20 NOTE — Progress Notes (Signed)
[facility]  Updated friend Barbaraann Share. Binder. Will see in 4 weeks. Rx to [facility].   Curlene Labrum, MD [facility] [address] [place], [place] [address] [phone] (office)

## 2022-09-20 NOTE — Anesthesia Procedure Notes (Signed)
Procedure Name: Intubation Date/Time: 09/20/2022 9:45 AM  Performed by: Hewitt Blade, CRNAPre-anesthesia Checklist: Patient identified, Emergency Drugs available, Suction available and Patient being monitored Patient Re-evaluated:Patient Re-evaluated prior to induction Oxygen Delivery Method: Circle system utilized Preoxygenation: Pre-oxygenation with 100% oxygen Induction Type: IV induction Ventilation: Mask ventilation without difficulty Laryngoscope Size: Mac and 3 Grade View: Grade I Tube type: Oral Tube size: 7.5 mm Number of attempts: 1 Airway Equipment and Method: Stylet and Oral airway Placement Confirmation: ETT inserted through vocal cords under direct vision, positive ETCO2 and breath sounds checked- equal and bilateral Secured at: 23 cm Tube secured with: Tape Dental Injury: Teeth and Oropharynx as per pre-operative assessment

## 2022-09-20 NOTE — Interval H&P Note (Signed)
History and Physical Interval Note:  09/20/2022 9:01 AM  Jerry Macdonald  has presented today for surgery, with the diagnosis of VENTRAL HERNIA, 2 CM.  The various methods of treatment have been discussed with the patient and family. After consideration of risks, benefits and other options for treatment, the patient has consented to  Procedure(s): HERNIA REPAIR VENTRAL ADULT W/ MESH (N/A) as a surgical intervention.  The patient's history has been reviewed, patient examined, no change in status, stable for surgery.  I have reviewed the patient's chart and labs.  Questions were answered to the patient's satisfaction.    1950s hand itching and swelling with pcn g; no SOB, HIVES. Will give the ancef.   Virl Cagey

## 2022-09-20 NOTE — Anesthesia Postprocedure Evaluation (Signed)
Anesthesia Post Note  Patient: Jerry Macdonald  Procedure(s) Performed: HERNIA REPAIR VENTRAL ADULT W/ MESH (Abdomen)  Patient location during evaluation: PACU Anesthesia Type: General Level of consciousness: awake and alert Pain management: pain level controlled Vital Signs Assessment: post-procedure vital signs reviewed and stable Respiratory status: spontaneous breathing, nonlabored ventilation, respiratory function stable and patient connected to nasal cannula oxygen Cardiovascular status: blood pressure returned to baseline and stable Postop Assessment: no apparent nausea or vomiting Anesthetic complications: no   There were no known notable events for this encounter.   Last Vitals:  Vitals:   09/20/22 1130 09/20/22 1206  BP: 132/86 134/86  Pulse: 94 100  Resp: 18 20  Temp:  36.5 C  SpO2: 93% 98%    Last Pain:  Vitals:   09/20/22 1206  TempSrc: Oral  PainSc: 1                  Trixie Rude

## 2022-09-20 NOTE — Transfer of Care (Signed)
Immediate Anesthesia Transfer of Care Note  Patient: Jerry Macdonald  Procedure(s) Performed: HERNIA REPAIR VENTRAL ADULT W/ MESH (Abdomen)  Patient Location: PACU  Anesthesia Type:General  Level of Consciousness: awake, alert , and oriented  Airway & Oxygen Therapy: Patient Spontanous Breathing  Post-op Assessment: Report given to RN and Post -op Vital signs reviewed and stable  Post vital signs: Reviewed and stable  Last Vitals:  Vitals Value Taken Time  BP 131/108 09/20/22 1104  Temp 36.7 C 09/20/22 1104  Pulse 94 09/20/22 1106  Resp 19 09/20/22 1106  SpO2 88 % 09/20/22 1106  Vitals shown include unvalidated device data.  Last Pain:  Vitals:   09/20/22 0807  TempSrc: Oral  PainSc:       Patients Stated Pain Goal: 7 (16/61/96 [id number])  Complications: No notable events documented.

## 2022-09-20 NOTE — Discharge Instructions (Signed)
Discharge Instructions Hernia:  You can start back walking slowly when you feel up to it. You should start back with 1/2 mile and gradually increase from there.  No heavy lifting > 10 lbs, excessive bending, pushing, pulling, or squatting for 8 weeks after surgery.  Wear your binder with activity.    Common Complaints: Pain at the incision site is common. This will improve with time. Take your pain medications as described below. Some nausea is common and poor appetite. The main goal is to stay hydrated the first few days after surgery.   Diet/ Activity: Diet as tolerated. You may not have an appetite, but it is important to stay hydrated. Drink 64 ounces of water a day. Your appetite will return with time.  Shower per your regular routine daily.  Do not take hot showers. Take warm showers that are less than 10 minutes. Rest and listen to your body, but do not remain in bed all day.Walk everyday for at least 15-20 minutes.  Deep cough and move around every 1-2 hours in the first few days after surgery. Do not pick at the dermabond glue on your incision sites.  This glue film will remain in place for 1-2 weeks and will start to peel off. Do not place lotions or balms on your incision unless instructed to specifically by Dr. Constance Haw. Do not lift > 10 lbs, perform excessive bending, pushing, pulling, squatting for 8 weeks after surgery. Where your abdominal binder with activity as much as possible. The activity restrictions and the abdominal binder are to prevent hernia formation at your incision while you are healing.   Pain Expectations and Narcotics: -After surgery you will have pain associated with your incisions and this is normal. The pain is muscular and nerve pain, and will get better with time. -You are encouraged and expected to take non narcotic medications like tylenol and ibuprofen (when able) to treat pain as multiple modalities can aid with pain treatment. -Narcotics are only  used when pain is severe or there is breakthrough pain. -You are not expected to have a pain score of 0 after surgery, as we cannot prevent pain. A pain score of 3-4 that allows you to be functional, move, walk, and tolerate some activity is the goal. The pain will continue to improve over the days after surgery and is dependent on your surgery. -Due to [place] law, we are only able to give a certain amount of pain medication to treat post operative pain, and we only give additional narcotics on a patient by patient basis.  -For most laparoscopic surgery, studies have shown that the majority of patients only need 10-15 narcotic pills, and for open surgeries most patients only need 15-20.   -Having appropriate expectations of pain and knowledge of pain management with non narcotics is important as we do not want anyone to become addicted to narcotic pain medication.  -Using ice packs in the first 48 hours and heating pads after 48 hours, wearing an abdominal binder (when recommended), and using over the counter medications are all ways to help with pain management.   -Simple acts like meditation and mindfulness practices after surgery can also help with pain control and research has proven the benefit of these practices.  Medication: Take tylenol or ibuprofen as needed for pain control, alternating every 4-6 hours.  Example:  Tylenol '1000mg'$  @ 6am, 12noon, 6pm, 52mdnight (Do not exceed '4000mg'$  of tylenol a day). Ibuprofen '800mg'$  @ 9am, 3pm, 9pm, 3am (Do not exceed  $'3600mg'o$  of ibuprofen a day).  Take Tramadol for breakthrough pain every 4 hours.  Take Colace for constipation related to narcotic pain medication. If you do not have a bowel movement in 2 days, take Miralax over the counter.  Drink plenty of water to also prevent constipation.   Contact Information: If you have questions or concerns, please call our office, [phone], Monday- Thursday 8AM-5PM and Friday 8AM-12Noon.  If it is after hours or on  the weekend, please call [facility] Main Number, [phone], [phone], and ask to speak to the surgeon on call for Dr. Constance Haw at [facility].

## 2022-09-20 NOTE — Anesthesia Preprocedure Evaluation (Signed)
Anesthesia Evaluation  Patient identified by MRN, date of birth, ID band Patient awake    Reviewed: Allergy & Precautions, NPO status , Patient's Chart, lab work & pertinent test results  History of Anesthesia Complications Negative for: history of anesthetic complications  Airway Mallampati: III  TM Distance: >3 FB Neck ROM: Full    Dental  (+) Dental Advisory Given, Missing   Pulmonary sleep apnea and Continuous Positive Airway Pressure Ventilation , former smoker   Pulmonary exam normal breath sounds clear to auscultation       Cardiovascular Exercise Tolerance: Good Normal cardiovascular exam Rhythm:Regular Rate:Normal  12-Aug-2020 11:03:49 [facility] ROUTINE RECORD Sinus rhythm Left axis deviation Right bundle branch block Abnormal ECG Confirmed by Asencion Noble [phone]) on 08/14/2020 10:20:22 AM   Neuro/Psych negative neurological ROS  negative psych ROS   GI/Hepatic Neg liver ROS,GERD  Medicated and Controlled,,Colon cancer    Endo/Other  Hypothyroidism    Renal/GU negative Renal ROS     Musculoskeletal negative musculoskeletal ROS (+)    Abdominal   Peds  Hematology negative hematology ROS (+)   Anesthesia Other Findings   Reproductive/Obstetrics negative OB ROS                             Anesthesia Physical Anesthesia Plan  ASA: 2  Anesthesia Plan: General   Post-op Pain Management:    Induction: Intravenous  PONV Risk Score and Plan: Ondansetron, Dexamethasone and Treatment may vary due to age or medical condition  Airway Management Planned: Oral ETT  Additional Equipment:   Intra-op Plan:   Post-operative Plan: Extubation in OR  Informed Consent: I have reviewed the patients History and Physical, chart, labs and discussed the procedure including the risks, benefits and alternatives for the proposed anesthesia with the patient or authorized  representative who has indicated his/her understanding and acceptance.       Plan Discussed with: CRNA  Anesthesia Plan Comments:         Anesthesia Quick Evaluation

## 2022-09-24 ENCOUNTER — Other Ambulatory Visit: Payer: Self-pay | Admitting: Urology

## 2022-09-24 DIAGNOSIS — N401 Enlarged prostate with lower urinary tract symptoms: Secondary | ICD-10-CM

## 2022-09-25 ENCOUNTER — Encounter (HOSPITAL_COMMUNITY): Payer: Self-pay | Admitting: General Surgery

## 2022-09-27 DIAGNOSIS — H353211 Exudative age-related macular degeneration, right eye, with active choroidal neovascularization: Secondary | ICD-10-CM | POA: Diagnosis not present

## 2022-09-27 DIAGNOSIS — H43822 Vitreomacular adhesion, left eye: Secondary | ICD-10-CM | POA: Diagnosis not present

## 2022-09-27 DIAGNOSIS — H43821 Vitreomacular adhesion, right eye: Secondary | ICD-10-CM | POA: Diagnosis not present

## 2022-10-01 DIAGNOSIS — E039 Hypothyroidism, unspecified: Secondary | ICD-10-CM | POA: Diagnosis not present

## 2022-10-01 DIAGNOSIS — D649 Anemia, unspecified: Secondary | ICD-10-CM | POA: Diagnosis not present

## 2022-10-01 DIAGNOSIS — R7301 Impaired fasting glucose: Secondary | ICD-10-CM | POA: Diagnosis not present

## 2022-10-08 DIAGNOSIS — Z6829 Body mass index (BMI) 29.0-29.9, adult: Secondary | ICD-10-CM | POA: Diagnosis not present

## 2022-10-08 DIAGNOSIS — Z0000 Encounter for general adult medical examination without abnormal findings: Secondary | ICD-10-CM | POA: Diagnosis not present

## 2022-10-08 DIAGNOSIS — M25562 Pain in left knee: Secondary | ICD-10-CM | POA: Diagnosis not present

## 2022-10-08 DIAGNOSIS — E039 Hypothyroidism, unspecified: Secondary | ICD-10-CM | POA: Diagnosis not present

## 2022-10-08 DIAGNOSIS — C61 Malignant neoplasm of prostate: Secondary | ICD-10-CM | POA: Diagnosis not present

## 2022-10-08 DIAGNOSIS — D649 Anemia, unspecified: Secondary | ICD-10-CM | POA: Diagnosis not present

## 2022-10-08 DIAGNOSIS — R7301 Impaired fasting glucose: Secondary | ICD-10-CM | POA: Diagnosis not present

## 2022-10-17 ENCOUNTER — Encounter: Payer: Medicare Other | Admitting: General Surgery

## 2022-10-25 ENCOUNTER — Encounter: Payer: Self-pay | Admitting: General Surgery

## 2022-10-25 ENCOUNTER — Ambulatory Visit (INDEPENDENT_AMBULATORY_CARE_PROVIDER_SITE_OTHER): Payer: Medicare Other | Admitting: General Surgery

## 2022-10-25 VITALS — BP 130/73 | HR 75 | Temp 98.2°F | Resp 16 | Ht 70.5 in | Wt 206.0 lb

## 2022-10-25 DIAGNOSIS — K432 Incisional hernia without obstruction or gangrene: Secondary | ICD-10-CM

## 2022-10-25 NOTE — Patient Instructions (Addendum)
Diet and activity as tolerated. Slowly restart walking. Can start lifting in 2  weeks, you had surgery 09/20/2022. So wait until you are 6 weeks out.

## 2022-10-25 NOTE — Progress Notes (Signed)
[facility]  Doing well, no issues. Wanting to walk.  BP 130/73   Pulse 75   Temp 98.2 F (36.8 C) (Oral)   Resp 16   Ht 5' 10.5" (1.791 m)   Wt 206 lb (93.4 kg)   SpO2 96%   BMI 29.14 kg/m  Incision healed, no signs of hernia Wearing abdominal binder  Patient s/p ventral hernia repair with mesh. Doing well.  Diet and activity as tolerated. Slowly restart walking. Can start lifting in 2  weeks, you had surgery 09/20/2022. So wait until you are 6 weeks out.   Curlene Labrum, MD [facility] [address] [place], [place] [address] [phone] (office)

## 2022-10-29 ENCOUNTER — Other Ambulatory Visit: Payer: Medicare Other

## 2022-10-29 DIAGNOSIS — C61 Malignant neoplasm of prostate: Secondary | ICD-10-CM

## 2022-10-30 LAB — PSA: Prostate Specific Ag, Serum: 10.9 ng/mL — ABNORMAL HIGH (ref 0.0–4.0)

## 2022-11-01 ENCOUNTER — Ambulatory Visit (INDEPENDENT_AMBULATORY_CARE_PROVIDER_SITE_OTHER): Payer: Medicare Other | Admitting: Urology

## 2022-11-01 ENCOUNTER — Encounter: Payer: Self-pay | Admitting: Urology

## 2022-11-01 VITALS — BP 129/81 | HR 87 | Ht 70.5 in | Wt 206.0 lb

## 2022-11-01 DIAGNOSIS — R351 Nocturia: Secondary | ICD-10-CM | POA: Diagnosis not present

## 2022-11-01 DIAGNOSIS — R972 Elevated prostate specific antigen [PSA]: Secondary | ICD-10-CM

## 2022-11-01 DIAGNOSIS — C61 Malignant neoplasm of prostate: Secondary | ICD-10-CM

## 2022-11-01 DIAGNOSIS — N401 Enlarged prostate with lower urinary tract symptoms: Secondary | ICD-10-CM

## 2022-11-01 DIAGNOSIS — N138 Other obstructive and reflux uropathy: Secondary | ICD-10-CM

## 2022-11-01 NOTE — Progress Notes (Signed)
Subjective:  1. Prostate cancer ([place])   2. Elevated prostate specific antigen (PSA)   3. BPH with urinary obstruction   4. Nocturia      Jerry Macdonald is a 89 year-old male established patient who is here evaluation for treatment of prostate cancer.    2/29/24[name] returns today in f/u.  He remains on AS for low volume GG2 prostate cancer.  His PSA is up to 10.9 from 5.5 at his visit in 8/23.  He has BPH with BOO and remains on finasteride and tamsulosin. He had a CT on 09/10/22 for f/u of his colon cancer and the prostate was moderately enlarged.   No adenopathy was noted.  He has some daytime frequency q1-2x.  He has nocturia 2x.  He has a good stream.  He has had no bone pain or weight loss.   8/24/23[name] returns today in f/u.  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  His PSA is down to 5.5. from 5.8 at his last visit.  He remains on finasteride and tamsulosin.  His IPSS is 4 with nocturia x3.   A CT on 02/12/22 for colon cancer f/u showed no GU issues.   10/26/21: Jerry Macdonald returns today in f/u.  His PSA is 5.8 which is up from 5.2 in 11/22 and 4.9 8/22.   He remains on finasteride and tamsulosin.  He has moderate LUTS with an IPSS of 17 with nocturia 2-3x.   He is content with the symptoms.   He has no weight loss.   He has some back pain but no bone pain.  A CT in November 2022 showed no findings to suggests a met.   His prostate cancer was diagnosed 10/04/2014. His PSA at his time of diagnosis was 6. His  PSA has been variable with and 4.4 in 5/22 and 4.9 on 04/06/21.  It was 3.5 in 7/21, 4.08 on 09/06/20 and 5.8 on 10/06/20 but he had a colon resection and a foley prior to that blood draw.  He had colon cancer with negative margins.  He had a thyroidectomy for goiter on 01/23/21.   He remains on finasteride and tamsulosin.  His IPSS is 12 with nocturia x 2.  His UA is clear.    03/11/20: Jerry Macdonald returns today in f/u. He is AS from low volume Gleason 7(3+4) disease  found on surveillance biopsy in 8/17. He had a single core Gleason 6 on his initial biopsy in 2016. He has been seen by Dr. Tammi Klippel and he felt continued AS was reasonable. He remains on finasteride and his PSA is back down to 3.5 from 3.8 at his last visit. He remains on tamsulosin. He is voiding well. He has nocturia x 2. He has a good stream without hesitancy. He has less urgency except in the morning. He does have a sensation of incomplete emptying and he can have a weak stream. His IPSS is 9. He has no associated signs or symptoms.   He was originally diagnosed in 2/16 with a single core of Gleason 6, 10% on the left mid lateral core. His PSA has risen very slowly and was 6.52 prior to a repeat biopsy in 8/17. The repeat biopsy showed a 174m prostate and there were 3 cores positive with 5% Gleason 6 in the left lateral base, 10% Gleason 7(3+4) in the left lateral apex and 20% Gleason 6 in the right medial apex.  ROS:  ROS:  A complete review of systems was performed.  All systems are negative except for pertinent findings as noted.   Review of Systems  Genitourinary:  Positive for frequency.  All other systems reviewed and are negative.   Allergies  Allergen Reactions   Penicillins     Swelling of hands and feet - severe Did it involve swelling of the face/tongue/throat, SOB, or low BP? No Did it involve sudden or severe rash/hives, skin peeling, or any reaction on the inside of your mouth or nose? No Did you need to seek medical attention at a hospital or doctor's office? No When did it last happen?      1950s If all above answers are "NO", may proceed with cephalosporin use.    Penicillin G Other (See Comments)    Outpatient Encounter Medications as of 11/01/2022  Medication Sig   acetaminophen (TYLENOL) 500 MG tablet Take 500 mg by mouth every 8 (eight) hours as needed for moderate pain.   calcium-vitamin D (OSCAL WITH D) 500-5 MG-MCG tablet Take 1 tablet by mouth  in the morning and at bedtime. Lunch and dinner   Cholecalciferol (VITAMIN D3) 25 MCG (1000 UT) CAPS Take 1 capsule by mouth 3 (three) times daily.   ferrous sulfate 324 MG TBEC Take 324 mg by mouth every other day.   finasteride (PROSCAR) 5 MG tablet TAKE 1 TABLET DAILY   levothyroxine (SYNTHROID) 100 MCG tablet Take 1 tablet (100 mcg total) by mouth daily before breakfast.   magnesium oxide (MAG-OX) 400 MG tablet Take 400 mg by mouth 2 (two) times daily.   Multiple Vitamins-Minerals (MULTIVITAMINS THER. W/MINERALS) TABS Take 1 tablet by mouth daily.   ondansetron (ZOFRAN-ODT) 4 MG disintegrating tablet Take 1 tablet (4 mg total) by mouth every 6 (six) hours as needed for nausea.   pantoprazole (PROTONIX) 40 MG tablet TAKE 1 TABLET ONCE A DAY BEFORE A MEAL, MAY TAKE SECOND TABLET BEFORE EVENING MEAL IF NEEDED   tamsulosin (FLOMAX) 0.4 MG CAPS capsule TAKE 1 CAPSULE DAILY   traMADol (ULTRAM) 50 MG tablet Take 1 tablet (50 mg total) by mouth every 6 (six) hours as needed for moderate pain or severe pain.   No facility-administered encounter medications on file as of 11/01/2022.    Past Medical History:  Diagnosis Date   Abnormal prostate biopsy    Back pain    with radiculopathy   Cataracts, bilateral    Constipation    Family history of lung cancer    Family history of ovarian cancer    Family history of prostate cancer    Goiter    HOH (hard of hearing)    Hyperlipidemia    Hypertrophy of prostate    Malaise and fatigue    Obesity    Pyogenic granuloma    left cheeck   S/P colonoscopy June 2011   rectal bleeding secondary to hemorrhoids   S/P colonoscopy 2007   [facility], diverticulosis per Dr. Juel Burrow note   S/P endoscopy June 2011   Barrett's, mild gastritis and duodenitis   Seborrheic keratosis    Sleep apnea     Past Surgical History:  Procedure Laterality Date   BIOPSY  03/27/2019   Procedure: BIOPSY;  Surgeon: Danie Binder, MD;  [facility];  Service:  Endoscopy;;   BIOPSY  08/16/2020   Procedure: BIOPSY;  Surgeon: Eloise Harman, DO;  [facility]: [facility];  Service: Endoscopy;;   COLONOSCOPY  JUN 2011  PAN-COLONIC DIVERTICULOSIS   COLONOSCOPY  2007   [facility]   COLONOSCOPY WITH PROPOFOL N/A 08/16/2020   Procedure: COLONOSCOPY WITH PROPOFOL;  Surgeon: Eloise Harman, DO;  [facility]: [facility];  Service: Endoscopy;  Laterality: N/A;  10:45am   ESOPHAGOGASTRODUODENOSCOPY  06/11/2011   Procedure: ESOPHAGOGASTRODUODENOSCOPY (EGD);  Surgeon: Dorothyann Peng, MD;  [facility];  Service: Endoscopy;  Laterality: N/A;  11:00   ESOPHAGOGASTRODUODENOSCOPY N/A 05/21/2014   Dr. Fields:Barrett's esophagus/ Medium sized hiatal hernia. path with indefinite dysplasia.    ESOPHAGOGASTRODUODENOSCOPY N/A 12/20/2014   Dr. Clayburn Pert gastric polyp/large HH/6 cm segment of Barrett's esophagus, path with hyperplastic gastric polyp, no dysplasia. Surveillance April 2017 due    ESOPHAGOGASTRODUODENOSCOPY N/A 01/20/2016   Procedure: ESOPHAGOGASTRODUODENOSCOPY (EGD);  Surgeon: Danie Binder, MD;  [facility];  Service: Endoscopy;  Laterality: N/A;  1230pm   ESOPHAGOGASTRODUODENOSCOPY N/A 03/27/2019   Procedure: ESOPHAGOGASTRODUODENOSCOPY (EGD);  Surgeon: Danie Binder, MD;  [facility];  Service: Endoscopy;  Laterality: N/A;  12:15pm   HERNIA REPAIR     LAPAROSCOPIC PARTIAL COLECTOMY Right 09/28/2020   Procedure: LAPAROSCOPIC RIGHT HEMI COLECTOMY;  Surgeon: Virl Cagey, MD;  Location: [facility];  Service: General;  Laterality: Right;   THYROIDECTOMY N/A 01/23/2021   Procedure: TOTAL THYROIDECTOMY;  Surgeon: Virl Cagey, MD;  Location: [facility];  Service: General;  Laterality: N/A;   UMBILICAL HERNIA REPAIR     VARICOSE VEIN SURGERY     VENTRAL HERNIA REPAIR N/A 09/20/2022   Procedure: HERNIA REPAIR VENTRAL ADULT W/ MESH;  Surgeon: Virl Cagey, MD;  Location: [facility];  Service: General;  Laterality: N/A;     Social History   Socioeconomic History   Marital status: Divorced    Spouse name: Not on file   Number of children: Not on file   Years of education: Not on file   Highest education level: Not on file  Occupational History   Not on file  Tobacco Use   Smoking status: Former    Packs/day: 0.50    Years: 2.00    Total pack years: 1.00    Types: Cigarettes    Quit date: 09/10/1963    Years since quitting: 59.1   Smokeless tobacco: Never   Tobacco comments:    quit in 1965  Vaping Use   Vaping Use: Former  Substance and Sexual Activity   Alcohol use: Not Currently   Drug use: No   Sexual activity: Not Currently  Other Topics Concern   Not on file  Social History Narrative   Not on file   Social Determinants of Health   Financial Resource Strain: Not on file  Food Insecurity: Not on file  Transportation Needs: No Transportation Needs (09/06/2020)   PRAPARE - Hydrologist (Medical): No    Lack of Transportation (Non-Medical): No  Physical Activity: Inactive (09/06/2020)   Exercise Vital Sign    Days of Exercise per Week: 0 days    Minutes of Exercise per Session: 0 min  Stress: Not on file  Social Connections: Not on file  Intimate Partner Violence: Not At Risk (09/06/2020)   Humiliation, Afraid, Rape, and Kick questionnaire    Fear of Current or Ex-Partner: No    Emotionally Abused: No    Physically Abused: No    Sexually Abused: No    Family History  Problem Relation Age of Onset   Prostate cancer Father 82  metastatic   Lung cancer Brother 23       heavy smoker   Prostate cancer Paternal Uncle        metastatic   Alzheimer's disease Maternal Grandmother    Prostate cancer Maternal Grandfather 82       metastatic   Prostate cancer Paternal Grandfather 8       metastatic   Ovarian cancer Paternal Aunt 6   Prostate cancer Paternal Uncle        metastatic   Cancer Cousin 12       unknown type, paternal first cousin    Liver cancer Cousin        dx 34s, paternal first cousin   Colon polyps Neg Hx    Colon cancer Neg Hx        Objective: Vitals:   11/01/22 1525  BP: 129/81  Pulse: 87      Physical Exam Vitals reviewed.  Constitutional:      Appearance: Normal appearance.  Genitourinary:    Comments: AP NST without mass. Prostate is 2.5+ without nodule but with subtle left apical induration.  SV non-palpable. Lymphadenopathy:     Upper Body:     Left upper body: No supraclavicular adenopathy.  Neurological:     Mental Status: He is alert.     Lab Results:  Results for orders placed or performed in visit on 11/01/22 (from the past 24 hour(s))  Urinalysis, Routine w reflex microscopic     Status: Abnormal   Collection Time: 11/01/22  3:50 PM  Result Value Ref Range   Specific Gravity, UA 1.025 1.005 - 1.030   pH, UA 6.0 5.0 - 7.5   Color, UA Yellow Yellow   Appearance Ur Clear Clear   Leukocytes,UA Trace (A) Negative   Protein,UA Negative Negative/Trace   Glucose, UA Negative Negative   Ketones, UA Trace (A) Negative   RBC, UA Negative Negative   Bilirubin, UA Negative Negative   Urobilinogen, Ur 0.2 0.2 - 1.0 mg/dL   Nitrite, UA Negative Negative   Microscopic Examination See below:    Narrative   Performed at:  [facility] [address], [place], [place]  [id number] Lab Director: Mina Marble MT, Phone:  [phone]  Microscopic Examination     Status: Abnormal   Collection Time: 11/01/22  3:50 PM   Urine  Result Value Ref Range   WBC, UA 6-10 (A) 0 - 5 /hpf   RBC, Urine 0-2 0 - 2 /hpf   Epithelial Cells (non renal) 0-10 0 - 10 /hpf   Bacteria, UA Moderate (A) None seen/Few   Narrative   Performed at:  [facility] [address], [place], [place]  [id number] Lab Director: [place], Phone:  [phone]      BMET No results for input(s): "NA", "K", "CL", "CO2", "GLUCOSE", "BUN", "CREATININE", "CALCIUM" in the last 72  hours. PSA Lab Results  Component Value Date   PSA1 10.9 (H) 10/29/2022   PSA1 5.5 (H) 04/23/2022   PSA1 5.8 (H) 10/16/2021     Lab Results  Component Value Date   PSA 3.5 03/04/2020   PSA Dr. [place] Pink of Urology follows 08/12/2008       Studies/Results: Narrative & Impression     CT Abdomen Pelvis W Contrast  Result Date: 09/11/2022 CLINICAL DATA:  Cecal adenocarcinoma. Previous right colectomy. * Tracking Code: BO * EXAM: CT ABDOMEN AND PELVIS WITH CONTRAST TECHNIQUE: Multidetector CT imaging of the abdomen and pelvis  was performed using the standard protocol following bolus administration of intravenous contrast. RADIATION DOSE REDUCTION: This exam was performed according to the departmental dose-optimization program which includes automated exposure control, adjustment of the mA and/or kV according to patient size and/or use of iterative reconstruction technique. CONTRAST:  132m OMNIPAQUE IOHEXOL 300 MG/ML  SOLN COMPARISON:  02/12/2022 FINDINGS: Lower Chest: No acute findings. Hepatobiliary: No hepatic masses identified. Stable small left hepatic lobe cyst noted. Gallbladder is unremarkable. No evidence of biliary ductal dilatation. Pancreas:  No mass or inflammatory changes. Spleen: Within normal limits in size and appearance. Adrenals/Urinary Tract: No suspicious masses identified. No evidence of ureteral calculi or hydronephrosis. Stomach/Bowel: Tiny hiatal hernia noted which is decreased in size. Postop changes seen from previous right colectomy. No mass identified. Diffuse colonic diverticulosis is again seen, however there is no evidence of diverticulitis. No evidence of obstruction, inflammatory process or abnormal fluid collections. Vascular/Lymphatic: No pathologically enlarged lymph nodes. No acute vascular findings. Aortic atherosclerotic calcification incidentally noted. Reproductive:  Stable moderately enlarged prostate gland. Other: Moderate to large right inguinal hernia  and small left inguinal hernia show no significant change, both containing only fat. Musculoskeletal: No suspicious bone lesions identified. Old right posterior rib fracture deformities again noted. IMPRESSION: No evidence of recurrent or metastatic carcinoma within the abdomen or pelvis. No acute findings. Colonic diverticulosis, without radiographic evidence of diverticulitis. Stable moderately enlarged prostate. Stable bilateral inguinal hernias, right greater than left, both containing only fat. Aortic Atherosclerosis (ICD10-I70.0). Electronically Signed   By: JMarlaine HindM.D.   On: 09/11/2022 09:05    Assessment & Plan: Prostate cancer on surveillance.   His PSA has doubled in 6 months and he has some left prostate induration.  I am going to get a repeat PSA in 2 weeks to confirm the level and if it is still up, I will get a PSMA PET.   If it is down, I will repeat in a month or so.     BPH with BOO.  He is voiding well on tamsulosin and finasteride.    No orders of the defined types were placed in this encounter.     Orders Placed This Encounter  Procedures   Microscopic Examination   Urinalysis, Routine w reflex microscopic   PSA    Standing Status:   Future    Standing Expiration Date:   01/30/2023   PSA    Standing Status:   Future    Standing Expiration Date:   05/02/2023      Return in about 3 months (around 01/30/2023).   CC: HCelene Squibb MD      JIrine Seal3/09/2022 Patient ID: DSharon Seller male   DOB: 1August 21, 1932 89y.o.   MRN: [id number]

## 2022-11-02 LAB — URINALYSIS, ROUTINE W REFLEX MICROSCOPIC
Bilirubin, UA: NEGATIVE
Glucose, UA: NEGATIVE
Nitrite, UA: NEGATIVE
Protein,UA: NEGATIVE
RBC, UA: NEGATIVE
Specific Gravity, UA: 1.025 (ref 1.005–1.030)
Urobilinogen, Ur: 0.2 mg/dL (ref 0.2–1.0)
pH, UA: 6 (ref 5.0–7.5)

## 2022-11-02 LAB — MICROSCOPIC EXAMINATION

## 2022-11-08 DIAGNOSIS — H353211 Exudative age-related macular degeneration, right eye, with active choroidal neovascularization: Secondary | ICD-10-CM | POA: Diagnosis not present

## 2022-11-08 DIAGNOSIS — H43823 Vitreomacular adhesion, bilateral: Secondary | ICD-10-CM | POA: Diagnosis not present

## 2022-11-08 DIAGNOSIS — H2513 Age-related nuclear cataract, bilateral: Secondary | ICD-10-CM | POA: Diagnosis not present

## 2022-11-12 ENCOUNTER — Other Ambulatory Visit: Payer: Self-pay

## 2022-11-12 ENCOUNTER — Other Ambulatory Visit: Payer: Medicare Other

## 2022-11-12 DIAGNOSIS — C61 Malignant neoplasm of prostate: Secondary | ICD-10-CM

## 2022-11-12 DIAGNOSIS — R972 Elevated prostate specific antigen [PSA]: Secondary | ICD-10-CM

## 2022-11-13 ENCOUNTER — Telehealth: Payer: Self-pay

## 2022-11-13 LAB — PSA: Prostate Specific Ag, Serum: 5.6 ng/mL — ABNORMAL HIGH (ref 0.0–4.0)

## 2022-11-13 NOTE — Telephone Encounter (Signed)
I called pt to inform him of MD response to PSA labs.  Unable to reach patient by phone, left a voicemail to return call for results.  Lab and f/u apt pushed out 6 months, apt reminders and letter of results sent to patient via mail.

## 2022-11-13 NOTE — Telephone Encounter (Signed)
-----   Message from Irine Seal, MD sent at 11/13/2022 11:12 AM EDT ----- His PSA is back down to baseline.   F/u in 6 months with PSA. ----- Message ----- From: Audie Box, CMA Sent: 11/13/2022   8:47 AM EDT To: Irine Seal, MD  Please review.

## 2022-12-06 DIAGNOSIS — G4733 Obstructive sleep apnea (adult) (pediatric): Secondary | ICD-10-CM | POA: Diagnosis not present

## 2022-12-20 DIAGNOSIS — H353132 Nonexudative age-related macular degeneration, bilateral, intermediate dry stage: Secondary | ICD-10-CM | POA: Diagnosis not present

## 2022-12-20 DIAGNOSIS — H43823 Vitreomacular adhesion, bilateral: Secondary | ICD-10-CM | POA: Diagnosis not present

## 2022-12-20 DIAGNOSIS — H2513 Age-related nuclear cataract, bilateral: Secondary | ICD-10-CM | POA: Diagnosis not present

## 2022-12-20 DIAGNOSIS — H353211 Exudative age-related macular degeneration, right eye, with active choroidal neovascularization: Secondary | ICD-10-CM | POA: Diagnosis not present

## 2023-01-24 ENCOUNTER — Other Ambulatory Visit: Payer: Medicare Other

## 2023-01-30 ENCOUNTER — Telehealth: Payer: Self-pay

## 2023-01-30 NOTE — Telephone Encounter (Signed)
Left a message with pt's caregiver Leonides Grills) requesting a return call to the office.

## 2023-01-31 ENCOUNTER — Ambulatory Visit: Payer: Medicare Other | Admitting: Urology

## 2023-01-31 DIAGNOSIS — H43823 Vitreomacular adhesion, bilateral: Secondary | ICD-10-CM | POA: Diagnosis not present

## 2023-01-31 DIAGNOSIS — H353211 Exudative age-related macular degeneration, right eye, with active choroidal neovascularization: Secondary | ICD-10-CM | POA: Diagnosis not present

## 2023-01-31 DIAGNOSIS — H353132 Nonexudative age-related macular degeneration, bilateral, intermediate dry stage: Secondary | ICD-10-CM | POA: Diagnosis not present

## 2023-01-31 DIAGNOSIS — H2513 Age-related nuclear cataract, bilateral: Secondary | ICD-10-CM | POA: Diagnosis not present

## 2023-02-04 ENCOUNTER — Other Ambulatory Visit: Payer: Self-pay

## 2023-02-04 ENCOUNTER — Telehealth: Payer: Self-pay

## 2023-02-04 DIAGNOSIS — N138 Other obstructive and reflux uropathy: Secondary | ICD-10-CM

## 2023-02-04 MED ORDER — FINASTERIDE 5 MG PO TABS: 5.0000 mg | ORAL_TABLET | Freq: Every day | ORAL | 0 refills | Status: DC

## 2023-02-04 NOTE — Telephone Encounter (Signed)
Pt's caregiver called stating pt usually takes the generic levothyroxine. States he received his refill from mail order that label reads that it is brand name synthroid. She wanted to make sure there is no contraindication for pt taking the brand.

## 2023-02-04 NOTE — Telephone Encounter (Signed)
Patient needing refill(s) as soon as possible.    Disp Refills Start End   finasteride (PROSCAR) 5 MG tablet         Pharmacy:  [facility] - [place], [place] - [address] Phone: [phone]  Fax: [phone]

## 2023-02-04 NOTE — Telephone Encounter (Signed)
Left message requesting pt's caregiver to return call to the office.

## 2023-02-04 NOTE — Telephone Encounter (Signed)
90 rx sent to pharmacy to get patient to 05/2023 OV with Dr. Annabell Howells

## 2023-02-05 NOTE — Telephone Encounter (Signed)
Left a message requesting pt's caregiver to return call to the office.

## 2023-02-06 NOTE — Telephone Encounter (Signed)
Discussed with pt's caregiver that there is no contraindication to take brand synthroid per Dr.Nida. Understanding voiced.

## 2023-03-06 ENCOUNTER — Other Ambulatory Visit: Payer: Self-pay | Admitting: Internal Medicine

## 2023-03-06 DIAGNOSIS — K2270 Barrett's esophagus without dysplasia: Secondary | ICD-10-CM

## 2023-03-06 DIAGNOSIS — Z0000 Encounter for general adult medical examination without abnormal findings: Secondary | ICD-10-CM

## 2023-03-18 ENCOUNTER — Inpatient Hospital Stay: Payer: Medicare Other | Attending: Hematology

## 2023-03-18 DIAGNOSIS — Z801 Family history of malignant neoplasm of trachea, bronchus and lung: Secondary | ICD-10-CM | POA: Insufficient documentation

## 2023-03-18 DIAGNOSIS — D649 Anemia, unspecified: Secondary | ICD-10-CM | POA: Insufficient documentation

## 2023-03-18 DIAGNOSIS — C61 Malignant neoplasm of prostate: Secondary | ICD-10-CM | POA: Insufficient documentation

## 2023-03-18 DIAGNOSIS — Z87891 Personal history of nicotine dependence: Secondary | ICD-10-CM | POA: Insufficient documentation

## 2023-03-18 DIAGNOSIS — Z85038 Personal history of other malignant neoplasm of large intestine: Secondary | ICD-10-CM | POA: Diagnosis not present

## 2023-03-18 DIAGNOSIS — Z8041 Family history of malignant neoplasm of ovary: Secondary | ICD-10-CM | POA: Diagnosis not present

## 2023-03-18 DIAGNOSIS — D509 Iron deficiency anemia, unspecified: Secondary | ICD-10-CM

## 2023-03-18 DIAGNOSIS — Z800 Family history of malignant neoplasm of digestive organs: Secondary | ICD-10-CM | POA: Diagnosis not present

## 2023-03-18 DIAGNOSIS — Z8042 Family history of malignant neoplasm of prostate: Secondary | ICD-10-CM | POA: Diagnosis not present

## 2023-03-18 DIAGNOSIS — C180 Malignant neoplasm of cecum: Secondary | ICD-10-CM

## 2023-03-18 LAB — CBC WITH DIFFERENTIAL/PLATELET
Abs Immature Granulocytes: 0.03 10*3/uL (ref 0.00–0.07)
Basophils Absolute: 0 10*3/uL (ref 0.0–0.1)
Basophils Relative: 0 %
Eosinophils Absolute: 0.1 10*3/uL (ref 0.0–0.5)
Eosinophils Relative: 2 %
HCT: 42.6 % (ref 39.0–52.0)
Hemoglobin: 14.4 g/dL (ref 13.0–17.0)
Immature Granulocytes: 0 %
Lymphocytes Relative: 25 %
Lymphs Abs: 1.8 10*3/uL (ref 0.7–4.0)
MCH: 33.9 pg (ref 26.0–34.0)
MCHC: 33.8 g/dL (ref 30.0–36.0)
MCV: 100.2 fL — ABNORMAL HIGH (ref 80.0–100.0)
Monocytes Absolute: 0.9 10*3/uL (ref 0.1–1.0)
Monocytes Relative: 13 %
Neutro Abs: 4.4 10*3/uL (ref 1.7–7.7)
Neutrophils Relative %: 60 %
Platelets: 178 10*3/uL (ref 150–400)
RBC: 4.25 MIL/uL (ref 4.22–5.81)
RDW: 12.7 % (ref 11.5–15.5)
WBC: 7.2 10*3/uL (ref 4.0–10.5)
nRBC: 0 % (ref 0.0–0.2)

## 2023-03-18 LAB — COMPREHENSIVE METABOLIC PANEL
ALT: 20 U/L (ref 0–44)
AST: 27 U/L (ref 15–41)
Albumin: 3.8 g/dL (ref 3.5–5.0)
Alkaline Phosphatase: 48 U/L (ref 38–126)
Anion gap: 5 (ref 5–15)
BUN: 17 mg/dL (ref 8–23)
CO2: 26 mmol/L (ref 22–32)
Calcium: 8.8 mg/dL — ABNORMAL LOW (ref 8.9–10.3)
Chloride: 107 mmol/L (ref 98–111)
Creatinine, Ser: 0.86 mg/dL (ref 0.61–1.24)
GFR, Estimated: >60 mL/min (ref >60)
Glucose, Bld: 107 mg/dL — ABNORMAL HIGH (ref 70–99)
Potassium: 4.3 mmol/L (ref 3.5–5.1)
Sodium: 138 mmol/L (ref 135–145)
Total Bilirubin: 2.1 mg/dL — ABNORMAL HIGH (ref 0.3–1.2)
Total Protein: 6.6 g/dL (ref 6.5–8.1)

## 2023-03-18 LAB — IRON AND TIBC
Iron: 142 ug/dL (ref 45–182)
Saturation Ratios: 49 % — ABNORMAL HIGH (ref 17.9–39.5)
TIBC: 290 ug/dL (ref 250–450)
UIBC: 148 ug/dL

## 2023-03-18 LAB — FERRITIN: Ferritin: 36 ng/mL (ref 24–336)

## 2023-03-19 LAB — CEA: CEA: 3.3 ng/mL (ref 0.0–4.7)

## 2023-03-24 NOTE — Progress Notes (Signed)
[facility] [address], [place] [address]    Clinic Day:  03/24/2023  Referring physician: Benita Stabile, MD  Patient Care Team: Benita Stabile, MD as PCP - General (Internal Medicine) West Bali, MD (Inactive) (Gastroenterology) Lanelle Bal, DO as Consulting Physician (Internal Medicine)   ASSESSMENT & PLAN:   Assessment: 1.  Cecal adenocarcinoma: -Colonoscopy by Dr. Marletta Lor on 08/16/2020 with fungating/infiltrative submucosal and ulcerated partially obstructing mass found in the cecum and at the ileocecal valve. -Biopsy consistent with adenocarcinoma, MMR preserved. -CT CAP on 08/22/2020 with cycle carcinoma with small ileocolic mesenteric lymph nodes with no evidence of distant metastatic spread.   2. Social/family history: -He is retired from [employer]. He had asbestos exposure. -Maternal and paternal grandfathers died of prostate cancer. Father died of prostate cancer. 2 paternal uncles had prostate cancer.  Plan: 1.  Stage II (PT3PN0) cecal adenocarcinoma, MMR preserved: - He denies any change in bowel habits or bleeding per rectum. - Reviewed labs from 09/10/2022 which showed normal LFTs.  Total bilirubin 1.4 and stable.  CBC was grossly normal.  CEA was 2.6. - CTAP on 09/10/2022 with no evidence of recurrence or metastatic cancer.  Other benign findings discussed. - RTC 6 months for follow-up with repeat labs.   2. Prostate cancer: - Last PSA is 2.6.  Continue follow-up with Dr. Wilson Singer.   3. Family history: - Genetic testing was recommended due to extensive family history of which she declined.   4. Normocytic anemia: - Continue iron tablet Monday, Wednesday and Friday.  Ferritin is 37.  No orders of the defined types were placed in this encounter.     Alben Deeds Teague,acting as a Neurosurgeon for Jerry Massed, MD.,have documented all relevant documentation on the behalf of Jerry Massed, MD,as directed by  Jerry Massed, MD  while in the presence of Jerry Massed, MD.   ***  [place] R Teague   7/21/20243:50 PM  CHIEF COMPLAINT:   Diagnosis: cecal cancer, iron deficiency anemia, and prostate cancer  Cancer Staging  No matching staging information was found for the patient.    Prior Therapy:  Laparoscopic right hemicolectomy on 09/28/2020   NGS Results: not done  Current Therapy:  Oral iron tablet M/W/F    HISTORY OF PRESENT ILLNESS:   Oncology History   No history exists.     INTERVAL HISTORY:   Jerry Macdonald is a 89 y.o. male presenting to clinic today for follow up of cecal carcinoma. He was last seen by me on 09/17/22.  Since his last visit, he underwent a ventral hernia repair with mesh on 1/18 with Dr. Algis Greenhouse.   Today, he states that he is doing well overall. His appetite level is at ***%. His energy level is at ***%.  PAST MEDICAL HISTORY:   Past Medical History: Past Medical History:  Diagnosis Date   Abnormal prostate biopsy    Back pain    with radiculopathy   Cataracts, bilateral    Constipation    Family history of lung cancer    Family history of ovarian cancer    Family history of prostate cancer    Goiter    HOH (hard of hearing)    Hyperlipidemia    Hypertrophy of prostate    Malaise and fatigue    Obesity    Pyogenic granuloma    left cheeck   S/P colonoscopy June 2011   rectal bleeding secondary to hemorrhoids   S/P colonoscopy 2007  [facility], diverticulosis per Dr. Scharlene Gloss note   S/P endoscopy June 2011   Barrett's, mild gastritis and duodenitis   Seborrheic keratosis    Sleep apnea     Surgical History: Past Surgical History:  Procedure Laterality Date   BIOPSY  03/27/2019   Procedure: BIOPSY;  Surgeon: West Bali, MD;  [facility];  Service: Endoscopy;;   BIOPSY  08/16/2020   Procedure: BIOPSY;  Surgeon: Lanelle Bal, DO;  [facility];  Service: Endoscopy;;   COLONOSCOPY  JUN 2011   PAN-COLONIC  DIVERTICULOSIS   COLONOSCOPY  2007   [facility]   COLONOSCOPY WITH PROPOFOL N/A 08/16/2020   Procedure: COLONOSCOPY WITH PROPOFOL;  Surgeon: Lanelle Bal, DO;  [facility];  Service: Endoscopy;  Laterality: N/A;  10:45am   ESOPHAGOGASTRODUODENOSCOPY  06/11/2011   Procedure: ESOPHAGOGASTRODUODENOSCOPY (EGD);  Surgeon: Arlyce Harman, MD;  [facility];  Service: Endoscopy;  Laterality: N/A;  11:00   ESOPHAGOGASTRODUODENOSCOPY N/A 05/21/2014   Dr. Fields:Barrett's esophagus/ Medium sized hiatal hernia. path with indefinite dysplasia.    ESOPHAGOGASTRODUODENOSCOPY N/A 12/20/2014   Dr. Jennell Corner gastric polyp/large HH/6 cm segment of Barrett's esophagus, path with hyperplastic gastric polyp, no dysplasia. Surveillance April 2017 due    ESOPHAGOGASTRODUODENOSCOPY N/A 01/20/2016   Procedure: ESOPHAGOGASTRODUODENOSCOPY (EGD);  Surgeon: West Bali, MD;  [facility];  Service: Endoscopy;  Laterality: N/A;  1230pm   ESOPHAGOGASTRODUODENOSCOPY N/A 03/27/2019   Procedure: ESOPHAGOGASTRODUODENOSCOPY (EGD);  Surgeon: West Bali, MD;  [facility];  Service: Endoscopy;  Laterality: N/A;  12:15pm   HERNIA REPAIR     LAPAROSCOPIC PARTIAL COLECTOMY Right 09/28/2020   Procedure: LAPAROSCOPIC RIGHT HEMI COLECTOMY;  Surgeon: Lucretia Roers, MD;  Location: [facility];  Service: General;  Laterality: Right;   THYROIDECTOMY N/A 01/23/2021   Procedure: TOTAL THYROIDECTOMY;  Surgeon: Lucretia Roers, MD;  Location: [facility];  Service: General;  Laterality: N/A;   UMBILICAL HERNIA REPAIR     VARICOSE VEIN SURGERY     VENTRAL HERNIA REPAIR N/A 09/20/2022   Procedure: HERNIA REPAIR VENTRAL ADULT W/ MESH;  Surgeon: Lucretia Roers, MD;  Location: [facility];  Service: General;  Laterality: N/A;    Social History: Social History   Socioeconomic History   Marital status: Divorced    Spouse name: Not on file   Number of children: Not on file   Years of education:  Not on file   Highest education level: Not on file  Occupational History   Not on file  Tobacco Use   Smoking status: Former    Current packs/day: 0.00    Average packs/day: 0.5 packs/day for 2.0 years (1.0 ttl pk-yrs)    Types: Cigarettes    Start date: 09/09/1961    Quit date: 09/10/1963    Years since quitting: 59.5   Smokeless tobacco: Never   Tobacco comments:    quit in 1965  Vaping Use   Vaping status: Former  Substance and Sexual Activity   Alcohol use: Not Currently   Drug use: No   Sexual activity: Not Currently  Other Topics Concern   Not on file  Social History Narrative   Not on file   Social Determinants of Health   Financial Resource Strain: Not on file  Food Insecurity: Not on file  Transportation Needs: No Transportation Needs (09/06/2020)   PRAPARE - Administrator, Civil Service (Medical): No    Lack of Transportation (  Non-Medical): No  Physical Activity: Inactive (09/06/2020)   Exercise Vital Sign    Days of Exercise per Week: 0 days    Minutes of Exercise per Session: 0 min  Stress: Not on file  Social Connections: Not on file  Intimate Partner Violence: Not At Risk (09/06/2020)   Humiliation, Afraid, Rape, and Kick questionnaire    Fear of Current or Ex-Partner: No    Emotionally Abused: No    Physically Abused: No    Sexually Abused: No    Family History: Family History  Problem Relation Age of Onset   Prostate cancer Father 60       metastatic   Lung cancer Brother 32       heavy smoker   Prostate cancer Paternal Uncle        metastatic   Alzheimer's disease Maternal Grandmother    Prostate cancer Maternal Grandfather 82       metastatic   Prostate cancer Paternal Grandfather 21       metastatic   Ovarian cancer Paternal Aunt 54   Prostate cancer Paternal Uncle        metastatic   Cancer Cousin 35       unknown type, paternal first cousin   Liver cancer Cousin        dx 30s, paternal first cousin   Colon polyps Neg Hx     Colon cancer Neg Hx     Current Medications:  Current Outpatient Medications:    acetaminophen (TYLENOL) 500 MG tablet, Take 500 mg by mouth every 8 (eight) hours as needed for moderate pain., Disp: , Rfl:    calcium-vitamin D (OSCAL WITH D) 500-5 MG-MCG tablet, Take 1 tablet by mouth in the morning and at bedtime. Lunch and dinner, Disp: 180 tablet, Rfl: 1   Cholecalciferol (VITAMIN D3) 25 MCG (1000 UT) CAPS, Take 1 capsule by mouth 3 (three) times daily., Disp: , Rfl:    ferrous sulfate 324 MG TBEC, Take 324 mg by mouth every other day., Disp: , Rfl:    finasteride (PROSCAR) 5 MG tablet, Take 1 tablet (5 mg total) by mouth daily., Disp: 90 tablet, Rfl: 0   levothyroxine (SYNTHROID) 100 MCG tablet, Take 1 tablet (100 mcg total) by mouth daily before breakfast., Disp: 90 tablet, Rfl: 3   magnesium oxide (MAG-OX) 400 MG tablet, Take 400 mg by mouth 2 (two) times daily., Disp: , Rfl:    Multiple Vitamins-Minerals (MULTIVITAMINS THER. W/MINERALS) TABS, Take 1 tablet by mouth daily., Disp: , Rfl:    ondansetron (ZOFRAN-ODT) 4 MG disintegrating tablet, Take 1 tablet (4 mg total) by mouth every 6 (six) hours as needed for nausea., Disp: 20 tablet, Rfl: 0   pantoprazole (PROTONIX) 40 MG tablet, TAKE 1 TABLET ONCE A DAY BEFORE A MEAL, MAY TAKE SECOND TABLET BEFORE EVENING MEAL IF NEEDED, Disp: 180 tablet, Rfl: 3   tamsulosin (FLOMAX) 0.4 MG CAPS capsule, TAKE 1 CAPSULE DAILY, Disp: 90 capsule, Rfl: 3   traMADol (ULTRAM) 50 MG tablet, Take 1 tablet (50 mg total) by mouth every 6 (six) hours as needed for moderate pain or severe pain., Disp: 15 tablet, Rfl: 0   Allergies: Allergies  Allergen Reactions   Penicillins     Swelling of hands and feet - severe Did it involve swelling of the face/tongue/throat, SOB, or low BP? No Did it involve sudden or severe rash/hives, skin peeling, or any reaction on the inside of your mouth or nose? No Did you need to seek  medical attention at a hospital or doctor's  office? No When did it last happen?      1950s If all above answers are "NO", may proceed with cephalosporin use.    Penicillin G Other (See Comments)    REVIEW OF SYSTEMS:   Review of Systems  Constitutional:  Negative for chills, fatigue and fever.  HENT:   Negative for lump/mass, mouth sores, nosebleeds, sore throat and trouble swallowing.   Eyes:  Negative for eye problems.  Respiratory:  Negative for cough and shortness of breath.   Cardiovascular:  Negative for chest pain, leg swelling and palpitations.  Gastrointestinal:  Negative for abdominal pain, constipation, diarrhea, nausea and vomiting.  Genitourinary:  Negative for bladder incontinence, difficulty urinating, dysuria, frequency, hematuria and nocturia.   Musculoskeletal:  Negative for arthralgias, back pain, flank pain, myalgias and neck pain.  Skin:  Negative for itching and rash.  Neurological:  Negative for dizziness, headaches and numbness.  Hematological:  Does not bruise/bleed easily.  Psychiatric/Behavioral:  Negative for depression, sleep disturbance and suicidal ideas. The patient is not nervous/anxious.   All other systems reviewed and are negative.    VITALS:   There were no vitals taken for this visit.  Wt Readings from Last 3 Encounters:  11/01/22 206 lb (93.4 kg)  10/25/22 206 lb (93.4 kg)  09/18/22 203 lb 7.8 oz (92.3 kg)    There is no height or weight on file to calculate BMI.  Performance status (ECOG): {CHL ONC [id number]  PHYSICAL EXAM:   Physical Exam Vitals and nursing note reviewed. Exam conducted with a chaperone present.  Constitutional:      Appearance: Normal appearance.  Cardiovascular:     Rate and Rhythm: Normal rate and regular rhythm.     Pulses: Normal pulses.     Heart sounds: Normal heart sounds.  Pulmonary:     Effort: Pulmonary effort is normal.     Breath sounds: Normal breath sounds.  Abdominal:     Palpations: Abdomen is soft. There is no hepatomegaly,  splenomegaly or mass.     Tenderness: There is no abdominal tenderness.  Musculoskeletal:     Right lower leg: No edema.     Left lower leg: No edema.  Lymphadenopathy:     Cervical: No cervical adenopathy.     Right cervical: No superficial, deep or posterior cervical adenopathy.    Left cervical: No superficial, deep or posterior cervical adenopathy.     Upper Body:     Right upper body: No supraclavicular or axillary adenopathy.     Left upper body: No supraclavicular or axillary adenopathy.  Neurological:     General: No focal deficit present.     Mental Status: He is alert and oriented to person, place, and time.  Psychiatric:        Mood and Affect: Mood normal.        Behavior: Behavior normal.     LABS:      Latest Ref Rng & Units 03/18/2023    1:07 PM 09/10/2022   12:59 PM 05/28/2022   11:11 AM  CBC  WBC 4.0 - 10.5 K/uL 7.2  7.0  6.3   Hemoglobin 13.0 - 17.0 g/dL 11.9  14.7  82.9   Hematocrit 39.0 - 52.0 % 42.6  40.9  41.7   Platelets 150 - 400 K/uL 178  202  173       Latest Ref Rng & Units 03/18/2023    1:07 PM 09/10/2022   12:59  PM 06/04/2022   11:22 AM  CMP  Glucose 70 - 99 mg/dL 161  88  96   BUN 8 - 23 mg/dL 17  14  17    Creatinine 0.61 - 1.24 mg/dL 0.96  0.45  4.09   Sodium 135 - 145 mmol/L 138  137  143   Potassium 3.5 - 5.1 mmol/L 4.3  4.1  4.7   Chloride 98 - 111 mmol/L 107  104  104   CO2 22 - 32 mmol/L 26  27  24    Calcium 8.9 - 10.3 mg/dL 8.8  8.6  9.1   Total Protein 6.5 - 8.1 g/dL 6.6  6.6  6.6   Total Bilirubin 0.3 - 1.2 mg/dL 2.1  1.4  1.3   Alkaline Phos 38 - 126 U/L 48  50  71   AST 15 - 41 U/L 27  24  22    ALT 0 - 44 U/L 20  20  16       Lab Results  Component Value Date   CEA1 3.3 03/18/2023   /  CEA  Date Value Ref Range Status  03/18/2023 3.3 0.0 - 4.7 ng/mL Final    Comment:    (NOTE)                             Nonsmokers          <3.9                             Smokers             <5.6 [employer]  Electrochemiluminescence Immunoassay (ECLIA) Values obtained with different assay methods or kits cannot be used interchangeably.  Results cannot be interpreted as absolute evidence of the presence or absence of malignant disease. Performed At: [facility] [address] [place], [place] [id number] Jolene Schimke MD NF:[id number]    Lab Results  Component Value Date   PSA1 5.6 (H) 11/12/2022   No results found for: "CAN199" No results found for: "CAN125"  No results found for: "TOTALPROTELP", "ALBUMINELP", "A1GS", "A2GS", "BETS", "BETA2SER", "GAMS", "MSPIKE", "SPEI" Lab Results  Component Value Date   TIBC 290 03/18/2023   TIBC 268 09/10/2022   TIBC 305 05/28/2022   FERRITIN 36 03/18/2023   FERRITIN 37 09/10/2022   FERRITIN 30 05/28/2022   IRONPCTSAT 49 (H) 03/18/2023   IRONPCTSAT 44 (H) 09/10/2022   IRONPCTSAT 44 (H) 05/28/2022   No results found for: "LDH"   STUDIES:   No results found.

## 2023-03-25 ENCOUNTER — Inpatient Hospital Stay (HOSPITAL_BASED_OUTPATIENT_CLINIC_OR_DEPARTMENT_OTHER): Payer: Medicare Other | Admitting: Hematology

## 2023-03-25 VITALS — BP 101/66 | HR 84 | Temp 97.5°F | Resp 18 | Wt 209.8 lb

## 2023-03-25 DIAGNOSIS — D649 Anemia, unspecified: Secondary | ICD-10-CM | POA: Diagnosis not present

## 2023-03-25 DIAGNOSIS — C61 Malignant neoplasm of prostate: Secondary | ICD-10-CM | POA: Diagnosis not present

## 2023-03-25 DIAGNOSIS — C180 Malignant neoplasm of cecum: Secondary | ICD-10-CM | POA: Diagnosis not present

## 2023-03-25 DIAGNOSIS — Z85038 Personal history of other malignant neoplasm of large intestine: Secondary | ICD-10-CM | POA: Diagnosis not present

## 2023-03-25 DIAGNOSIS — Z8042 Family history of malignant neoplasm of prostate: Secondary | ICD-10-CM | POA: Diagnosis not present

## 2023-03-25 DIAGNOSIS — D509 Iron deficiency anemia, unspecified: Secondary | ICD-10-CM | POA: Diagnosis not present

## 2023-03-25 DIAGNOSIS — Z801 Family history of malignant neoplasm of trachea, bronchus and lung: Secondary | ICD-10-CM | POA: Diagnosis not present

## 2023-03-25 DIAGNOSIS — Z87891 Personal history of nicotine dependence: Secondary | ICD-10-CM | POA: Diagnosis not present

## 2023-03-25 NOTE — Patient Instructions (Signed)
[facility]  Discharge Instructions  You were seen and examined today by Dr. Ellin Saba.  Dr. Ellin Saba discussed your most recent lab work which revealed that everything looks good and stable.  Continue taking the Iron pill Monday, Wednesday, and Friday.  Follow-up as scheduled in 6 months.    Thank you for choosing [facility] to provide your oncology and hematology care.   To afford each patient quality time with our provider, please arrive at least 15 minutes before your scheduled appointment time. You may need to reschedule your appointment if you arrive late (10 or more minutes). Arriving late affects you and other patients whose appointments are after yours.  Also, if you miss three or more appointments without notifying the office, you may be dismissed from the clinic at the provider's discretion.    Again, thank you for choosing [facility].  Our hope is that these requests will decrease the amount of time that you wait before being seen by our physicians.   If you have a lab appointment with the Cancer Center - please note that after April 8th, all labs will be drawn in the cancer center.  You do not have to check in or register with the main entrance as you have in the past but will complete your check-in at the cancer center.            _____________________________________________________________  Should you have questions after your visit to [facility], please contact our office at [phone] and follow the prompts.  Our office hours are 8:00 a.m. to 4:30 p.m. Monday - Thursday and 8:00 a.m. to 2:30 p.m. Friday.  Please note that voicemails left after 4:00 p.m. may not be returned until the following business day.  We are closed weekends and all major holidays.  You do have access to a nurse 24-7, just call the main number to the clinic [phone] and do not press any options, hold on the  line and a nurse will answer the phone.    For prescription refill requests, have your pharmacy contact our office and allow 72 hours.    Masks are no longer required in the cancer centers. If you would like for your care team to wear a mask while they are taking care of you, please let them know. You may have one support person who is at least 87 years old accompany you for your appointments.

## 2023-03-28 DIAGNOSIS — H353211 Exudative age-related macular degeneration, right eye, with active choroidal neovascularization: Secondary | ICD-10-CM | POA: Diagnosis not present

## 2023-03-28 DIAGNOSIS — H353132 Nonexudative age-related macular degeneration, bilateral, intermediate dry stage: Secondary | ICD-10-CM | POA: Diagnosis not present

## 2023-03-28 DIAGNOSIS — H2513 Age-related nuclear cataract, bilateral: Secondary | ICD-10-CM | POA: Diagnosis not present

## 2023-03-28 DIAGNOSIS — H43823 Vitreomacular adhesion, bilateral: Secondary | ICD-10-CM | POA: Diagnosis not present

## 2023-04-01 DIAGNOSIS — D649 Anemia, unspecified: Secondary | ICD-10-CM | POA: Diagnosis not present

## 2023-04-01 DIAGNOSIS — R7301 Impaired fasting glucose: Secondary | ICD-10-CM | POA: Diagnosis not present

## 2023-04-01 DIAGNOSIS — E039 Hypothyroidism, unspecified: Secondary | ICD-10-CM | POA: Diagnosis not present

## 2023-04-08 DIAGNOSIS — Z7182 Exercise counseling: Secondary | ICD-10-CM | POA: Diagnosis not present

## 2023-04-08 DIAGNOSIS — R7301 Impaired fasting glucose: Secondary | ICD-10-CM | POA: Diagnosis not present

## 2023-04-08 DIAGNOSIS — M545 Low back pain, unspecified: Secondary | ICD-10-CM | POA: Diagnosis not present

## 2023-04-08 DIAGNOSIS — G4733 Obstructive sleep apnea (adult) (pediatric): Secondary | ICD-10-CM | POA: Diagnosis not present

## 2023-04-08 DIAGNOSIS — Z9989 Dependence on other enabling machines and devices: Secondary | ICD-10-CM | POA: Diagnosis not present

## 2023-04-08 DIAGNOSIS — M25562 Pain in left knee: Secondary | ICD-10-CM | POA: Diagnosis not present

## 2023-04-08 DIAGNOSIS — G8929 Other chronic pain: Secondary | ICD-10-CM | POA: Diagnosis not present

## 2023-04-08 DIAGNOSIS — E039 Hypothyroidism, unspecified: Secondary | ICD-10-CM | POA: Diagnosis not present

## 2023-04-08 DIAGNOSIS — D649 Anemia, unspecified: Secondary | ICD-10-CM | POA: Diagnosis not present

## 2023-04-08 DIAGNOSIS — Z79890 Hormone replacement therapy (postmenopausal): Secondary | ICD-10-CM | POA: Diagnosis not present

## 2023-04-08 DIAGNOSIS — C61 Malignant neoplasm of prostate: Secondary | ICD-10-CM | POA: Diagnosis not present

## 2023-05-05 DIAGNOSIS — Z23 Encounter for immunization: Secondary | ICD-10-CM | POA: Diagnosis not present

## 2023-05-09 ENCOUNTER — Other Ambulatory Visit: Payer: Medicare Other

## 2023-05-09 DIAGNOSIS — C61 Malignant neoplasm of prostate: Secondary | ICD-10-CM

## 2023-05-10 LAB — PSA: Prostate Specific Ag, Serum: 5.7 ng/mL — ABNORMAL HIGH (ref 0.0–4.0)

## 2023-05-16 ENCOUNTER — Ambulatory Visit (INDEPENDENT_AMBULATORY_CARE_PROVIDER_SITE_OTHER): Payer: Medicare Other | Admitting: Urology

## 2023-05-16 ENCOUNTER — Encounter: Payer: Self-pay | Admitting: Urology

## 2023-05-16 ENCOUNTER — Ambulatory Visit: Payer: Medicare Other | Admitting: Urology

## 2023-05-16 VITALS — BP 125/73 | HR 88

## 2023-05-16 DIAGNOSIS — N403 Nodular prostate with lower urinary tract symptoms: Secondary | ICD-10-CM | POA: Diagnosis not present

## 2023-05-16 DIAGNOSIS — R972 Elevated prostate specific antigen [PSA]: Secondary | ICD-10-CM | POA: Diagnosis not present

## 2023-05-16 DIAGNOSIS — N138 Other obstructive and reflux uropathy: Secondary | ICD-10-CM | POA: Diagnosis not present

## 2023-05-16 DIAGNOSIS — R351 Nocturia: Secondary | ICD-10-CM

## 2023-05-16 DIAGNOSIS — C61 Malignant neoplasm of prostate: Secondary | ICD-10-CM | POA: Diagnosis not present

## 2023-05-16 LAB — URINALYSIS, ROUTINE W REFLEX MICROSCOPIC
Bilirubin, UA: NEGATIVE
Glucose, UA: NEGATIVE
Ketones, UA: NEGATIVE
Leukocytes,UA: NEGATIVE
Nitrite, UA: NEGATIVE
Protein,UA: NEGATIVE
RBC, UA: NEGATIVE
Specific Gravity, UA: 1.02 (ref 1.005–1.030)
Urobilinogen, Ur: 0.2 mg/dL (ref 0.2–1.0)
pH, UA: 6 (ref 5.0–7.5)

## 2023-05-16 MED ORDER — FINASTERIDE 5 MG PO TABS
5.0000 mg | ORAL_TABLET | Freq: Every day | ORAL | 3 refills | Status: DC
Start: 2023-05-16 — End: 2024-05-29

## 2023-05-16 MED ORDER — TAMSULOSIN HCL 0.4 MG PO CAPS
0.4000 mg | ORAL_CAPSULE | Freq: Every day | ORAL | 3 refills | Status: DC
Start: 2023-05-16 — End: 2024-05-29

## 2023-05-16 NOTE — Progress Notes (Signed)
Subjective:  1. Prostate cancer (HCC)   2. Elevated prostate specific antigen (PSA)   3. BPH with urinary obstruction   4. Nocturia      Jerry Macdonald is a 89 y.o.  male established patient who is here evaluation for treatment of prostate cancer.     05/16/23: Jerry Macdonald returns today in f/u for his history below.   His PSA is stable since March at 5.7.   He remains on tamsulosin and finasteride. He is voiding well with an IPSS of 11 and nocturia x 3.  He has no weight loss or bone pain.    2/29/24[name] returns today in f/u.  He remains on AS for low volume GG2 prostate cancer.  His PSA is up to 10.9 from 5.5 at his visit in 8/23.  He has BPH with BOO and remains on finasteride and tamsulosin. He had a CT on 09/10/22 for f/u of his colon cancer and the prostate was moderately enlarged.   No adenopathy was noted.  He has some daytime frequency q1-2x.  He has nocturia 2x.  He has a good stream.  He has had no bone pain or weight loss.   8/24/23[name] returns today in f/u.  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  His PSA is down to 5.5. from 5.8 at his last visit.  He remains on finasteride and tamsulosin.  His IPSS is 4 with nocturia x3.   A CT on 02/12/22 for colon cancer f/u showed no GU issues.   10/26/21: Jerry Macdonald returns today in f/u.  His PSA is 5.8 which is up from 5.2 in 11/22 and 4.9 8/22.   He remains on finasteride and tamsulosin.  He has moderate LUTS with an IPSS of 17 with nocturia 2-3x.   He is content with the symptoms.   He has no weight loss.   He has some back pain but no bone pain.  A CT in November 2022 showed no findings to suggests a met.   His prostate cancer was diagnosed 10/04/2014. His PSA at his time of diagnosis was 6. His  PSA has been variable with and 4.4 in 5/22 and 4.9 on 04/06/21.  It was 3.5 in 7/21, 4.08 on 09/06/20 and 5.8 on 10/06/20 but he had a colon resection and a foley prior to that blood draw.  He had colon cancer with negative margins.   He had a thyroidectomy for goiter on 01/23/21.   He remains on finasteride and tamsulosin.  His IPSS is 12 with nocturia x 2.  His UA is clear.    03/11/20: Jerry Macdonald returns today in f/u. He is AS from low volume Gleason 7(3+4) disease found on surveillance biopsy in 8/17. He had a single core Gleason 6 on his initial biopsy in 2016. He has been seen by Dr. Kathrynn Running and he felt continued AS was reasonable. He remains on finasteride and his PSA is back down to 3.5 from 3.8 at his last visit. He remains on tamsulosin. He is voiding well. He has nocturia x 2. He has a good stream without hesitancy. He has less urgency except in the morning. He does have a sensation of incomplete emptying and he can have a weak stream. His IPSS is 9. He has no associated signs or symptoms.   He was originally diagnosed in 2/16 with a single core of Gleason 6, 10% on the left mid lateral core. His PSA has risen very slowly  and was 6.52 prior to a repeat biopsy in 8/17. The repeat biopsy showed a prostate and there were 3 cores positive with 5% Gleason 6 in the left lateral base, 10% Gleason 7(3+4) in the left lateral apex and 20% Gleason 6 in the right medial apex.         ROS:  ROS:  A complete review of systems was performed.  All systems are negative except for pertinent findings as noted.   Review of Systems  Genitourinary:  Positive for frequency.  All other systems reviewed and are negative.   Allergies  Allergen Reactions   Penicillins     Swelling of hands and feet - severe Did it involve swelling of the face/tongue/throat, SOB, or low BP? No Did it involve sudden or severe rash/hives, skin peeling, or any reaction on the inside of your mouth or nose? No Did you need to seek medical attention at a hospital or doctor's office? No When did it last happen?      1950s If all above answers are "NO", may proceed with cephalosporin use.    Penicillin G Other (See Comments)    Outpatient Encounter  Medications as of 05/16/2023  Medication Sig   acetaminophen (TYLENOL) 500 MG tablet Take 500 mg by mouth every 8 (eight) hours as needed for moderate pain.   calcium-vitamin D (OSCAL WITH D) 500-5 MG-MCG tablet Take 1 tablet by mouth in the morning and at bedtime. Lunch and dinner   Cholecalciferol (VITAMIN D3) 25 MCG (1000 UT) CAPS Take 1 capsule by mouth 3 (three) times daily.   ferrous sulfate 324 MG TBEC Take 324 mg by mouth every other day.   levothyroxine (SYNTHROID) 100 MCG tablet Take 1 tablet (100 mcg total) by mouth daily before breakfast.   magnesium oxide (MAG-OX) 400 MG tablet Take 400 mg by mouth 2 (two) times daily.   Multiple Vitamins-Minerals (MULTIVITAMINS THER. W/MINERALS) TABS Take 1 tablet by mouth daily.   ondansetron (ZOFRAN-ODT) 4 MG disintegrating tablet Take 1 tablet (4 mg total) by mouth every 6 (six) hours as needed for nausea.   pantoprazole (PROTONIX) 40 MG tablet TAKE 1 TABLET ONCE A DAY BEFORE A MEAL, MAY TAKE SECOND TABLET BEFORE EVENING MEAL IF NEEDED   traMADol (ULTRAM) 50 MG tablet Take 1 tablet (50 mg total) by mouth every 6 (six) hours as needed for moderate pain or severe pain.   [DISCONTINUED] finasteride (PROSCAR) 5 MG tablet Take 1 tablet (5 mg total) by mouth daily.   [DISCONTINUED] tamsulosin (FLOMAX) 0.4 MG CAPS capsule TAKE 1 CAPSULE DAILY   finasteride (PROSCAR) 5 MG tablet Take 1 tablet (5 mg total) by mouth daily.   tamsulosin (FLOMAX) 0.4 MG CAPS capsule Take 1 capsule (0.4 mg total) by mouth daily.   No facility-administered encounter medications on file as of 05/16/2023.    Past Medical History:  Diagnosis Date   Abnormal prostate biopsy    Back pain    with radiculopathy   Cataracts, bilateral    Constipation    Family history of lung cancer    Family history of ovarian cancer    Family history of prostate cancer    Goiter    HOH (hard of hearing)    Hyperlipidemia    Hypertrophy of prostate    Malaise and fatigue    Obesity     Pyogenic granuloma    left cheeck   S/P colonoscopy June 2011   rectal bleeding secondary to hemorrhoids   S/P  colonoscopy 2007   [facility], diverticulosis per Dr. Scharlene Gloss note   S/P endoscopy June 2011   Barrett's, mild gastritis and duodenitis   Seborrheic keratosis    Sleep apnea     Past Surgical History:  Procedure Laterality Date   BIOPSY  03/27/2019   Procedure: BIOPSY;  Surgeon: West Bali, MD;  [facility]: [facility];  Service: Endoscopy;;   BIOPSY  08/16/2020   Procedure: BIOPSY;  Surgeon: Lanelle Bal, DO;  [facility]: [facility];  Service: Endoscopy;;   COLONOSCOPY  JUN 2011   PAN-COLONIC DIVERTICULOSIS   COLONOSCOPY  2007   [place] [facility]   COLONOSCOPY WITH PROPOFOL N/A 08/16/2020   Procedure: COLONOSCOPY WITH PROPOFOL;  Surgeon: Lanelle Bal, DO;  [facility]: [facility];  Service: Endoscopy;  Laterality: N/A;  10:45am   ESOPHAGOGASTRODUODENOSCOPY  06/11/2011   Procedure: ESOPHAGOGASTRODUODENOSCOPY (EGD);  Surgeon: Arlyce Harman, MD;  [facility]: [facility];  Service: Endoscopy;  Laterality: N/A;  11:00   ESOPHAGOGASTRODUODENOSCOPY N/A 05/21/2014   Dr. Fields:Barrett's esophagus/ Medium sized hiatal hernia. path with indefinite dysplasia.    ESOPHAGOGASTRODUODENOSCOPY N/A 12/20/2014   Dr. Jennell Corner gastric polyp/large HH/6 cm segment of Barrett's esophagus, path with hyperplastic gastric polyp, no dysplasia. Surveillance April 2017 due    ESOPHAGOGASTRODUODENOSCOPY N/A 01/20/2016   Procedure: ESOPHAGOGASTRODUODENOSCOPY (EGD);  Surgeon: West Bali, MD;  [facility];  Service: Endoscopy;  Laterality: N/A;  1230pm   ESOPHAGOGASTRODUODENOSCOPY N/A 03/27/2019   Procedure: ESOPHAGOGASTRODUODENOSCOPY (EGD);  Surgeon: West Bali, MD;  [facility];  Service: Endoscopy;  Laterality: N/A;  12:15pm   HERNIA REPAIR     LAPAROSCOPIC PARTIAL COLECTOMY Right 09/28/2020   Procedure: LAPAROSCOPIC RIGHT HEMI COLECTOMY;  Surgeon: Lucretia Roers, MD;  Location: [facility];  Service: General;  Laterality: Right;   THYROIDECTOMY N/A 01/23/2021   Procedure: TOTAL THYROIDECTOMY;  Surgeon: Lucretia Roers, MD;  Location: [facility];  Service: General;  Laterality: N/A;   UMBILICAL HERNIA REPAIR     VARICOSE VEIN SURGERY     VENTRAL HERNIA REPAIR N/A 09/20/2022   Procedure: HERNIA REPAIR VENTRAL ADULT W/ MESH;  Surgeon: Lucretia Roers, MD;  Location: [facility];  Service: General;  Laterality: N/A;    Social History   Socioeconomic History   Marital status: Divorced    Spouse name: Not on file   Number of children: Not on file   Years of education: Not on file   Highest education level: Not on file  Occupational History   Not on file  Tobacco Use   Smoking status: Former    Current packs/day: 0.00    Average packs/day: 0.5 packs/day for 2.0 years (1.0 ttl pk-yrs)    Types: Cigarettes    Start date: 09/09/1961    Quit date: 09/10/1963    Years since quitting: 59.7   Smokeless tobacco: Never   Tobacco comments:    quit in 1965  Vaping Use   Vaping status: Former  Substance and Sexual Activity   Alcohol use: Not Currently   Drug use: No   Sexual activity: Not Currently  Other Topics Concern   Not on file  Social History Narrative   Not on file   Social Determinants of Health   Financial Resource Strain: Not on file  Food Insecurity: Not on file  Transportation Needs: No Transportation Needs (09/06/2020)   PRAPARE - Administrator, Civil Service (Medical): No    Lack of Transportation (Non-Medical):  No  Physical Activity: Inactive (09/06/2020)   Exercise Vital Sign    Days of Exercise per Week: 0 days    Minutes of Exercise per Session: 0 min  Stress: Not on file  Social Connections: Not on file  Intimate Partner Violence: Not At Risk (09/06/2020)   Humiliation, Afraid, Rape, and Kick questionnaire    Fear of Current or Ex-Partner: No    Emotionally Abused: No    Physically Abused: No    Sexually Abused:  No    Family History  Problem Relation Age of Onset   Prostate cancer Father 62       metastatic   Lung cancer Brother 34       heavy smoker   Prostate cancer Paternal Uncle        metastatic   Alzheimer's disease Maternal Grandmother    Prostate cancer Maternal Grandfather 82       metastatic   Prostate cancer Paternal Grandfather 45       metastatic   Ovarian cancer Paternal Aunt 70   Prostate cancer Paternal Uncle        metastatic   Cancer Cousin 53       unknown type, paternal first cousin   Liver cancer Cousin        dx 30s, paternal first cousin   Colon polyps Neg Hx    Colon cancer Neg Hx        Objective: Vitals:   05/16/23 1030  BP: 125/73  Pulse: 88      Physical Exam Vitals reviewed.  Constitutional:      Appearance: Normal appearance.  Neurological:     Mental Status: He is alert.     Lab Results:  Results for orders placed or performed in visit on 05/16/23 (from the past 24 hour(s))  Urinalysis, Routine w reflex microscopic     Status: None   Collection Time: 05/16/23 10:36 AM  Result Value Ref Range   Specific Gravity, UA 1.020 1.005 - 1.030   pH, UA 6.0 5.0 - 7.5   Color, UA Yellow Yellow   Appearance Ur Clear Clear   Leukocytes,UA Negative Negative   Protein,UA Negative Negative/Trace   Glucose, UA Negative Negative   Ketones, UA Negative Negative   RBC, UA Negative Negative   Bilirubin, UA Negative Negative   Urobilinogen, Ur 0.2 0.2 - 1.0 mg/dL   Nitrite, UA Negative Negative   Microscopic Examination Comment    Narrative   Performed at:  [address] [facility] [address], [place], [place]  [id number] Lab Director: Chinita Pester MT, Phone:  [phone]       BMET No results for input(s): "NA", "K", "CL", "CO2", "GLUCOSE", "BUN", "CREATININE", "CALCIUM" in the last 72 hours. PSA Lab Results  Component Value Date   PSA1 5.7 (H) 05/09/2023   PSA1 5.6 (H) 11/12/2022   PSA1 10.9 (H) 10/29/2022     Lab Results   Component Value Date   PSA 3.5 03/04/2020   PSA Dr. Rito Ehrlich of Urology follows 08/12/2008    UA is clear   Studies/Results: Narrative & Impression     No results found.  Assessment & Plan: Prostate cancer on surveillance.   His PSA is back down to prior levels.  I will have him return in 6 months with a PSA. Marland Kitchen     Nodular prostate with BOO.  He is voiding well on tamsulosin and finasteride.    Meds ordered this encounter  Medications   finasteride (  PROSCAR) 5 MG tablet    Sig: Take 1 tablet (5 mg total) by mouth daily.    Dispense:  90 tablet    Refill:  3   tamsulosin (FLOMAX) 0.4 MG CAPS capsule    Sig: Take 1 capsule (0.4 mg total) by mouth daily.    Dispense:  90 capsule    Refill:  3      Orders Placed This Encounter  Procedures   Urinalysis, Routine w reflex microscopic   PSA    Standing Status:   Future    Standing Expiration Date:   05/15/2024      Return in about 6 months (around 11/13/2023) for with PSA.   CC: Benita Stabile, MD      Bjorn Pippin 05/17/2023 Patient ID: Junious Dresser, male   DOB: Dec 23, 1930, 89 y.o.   MRN: [id number]

## 2023-05-30 DIAGNOSIS — H43823 Vitreomacular adhesion, bilateral: Secondary | ICD-10-CM | POA: Diagnosis not present

## 2023-05-30 DIAGNOSIS — H353211 Exudative age-related macular degeneration, right eye, with active choroidal neovascularization: Secondary | ICD-10-CM | POA: Diagnosis not present

## 2023-05-30 DIAGNOSIS — H353132 Nonexudative age-related macular degeneration, bilateral, intermediate dry stage: Secondary | ICD-10-CM | POA: Diagnosis not present

## 2023-05-30 DIAGNOSIS — H2513 Age-related nuclear cataract, bilateral: Secondary | ICD-10-CM | POA: Diagnosis not present

## 2023-06-05 ENCOUNTER — Telehealth: Payer: Self-pay | Admitting: "Endocrinology

## 2023-06-05 DIAGNOSIS — E890 Postprocedural hypothyroidism: Secondary | ICD-10-CM

## 2023-06-05 NOTE — Telephone Encounter (Signed)
Order updated

## 2023-06-05 NOTE — Telephone Encounter (Signed)
Please update labs for pt

## 2023-06-06 DIAGNOSIS — H5703 Miosis: Secondary | ICD-10-CM | POA: Diagnosis not present

## 2023-06-06 DIAGNOSIS — H02831 Dermatochalasis of right upper eyelid: Secondary | ICD-10-CM | POA: Diagnosis not present

## 2023-06-06 DIAGNOSIS — H25813 Combined forms of age-related cataract, bilateral: Secondary | ICD-10-CM | POA: Diagnosis not present

## 2023-06-06 DIAGNOSIS — H02834 Dermatochalasis of left upper eyelid: Secondary | ICD-10-CM | POA: Diagnosis not present

## 2023-06-06 DIAGNOSIS — H353211 Exudative age-related macular degeneration, right eye, with active choroidal neovascularization: Secondary | ICD-10-CM | POA: Diagnosis not present

## 2023-06-06 DIAGNOSIS — H2181 Floppy iris syndrome: Secondary | ICD-10-CM | POA: Diagnosis not present

## 2023-06-06 DIAGNOSIS — H353121 Nonexudative age-related macular degeneration, left eye, early dry stage: Secondary | ICD-10-CM | POA: Diagnosis not present

## 2023-06-06 DIAGNOSIS — H43823 Vitreomacular adhesion, bilateral: Secondary | ICD-10-CM | POA: Diagnosis not present

## 2023-06-10 DIAGNOSIS — E890 Postprocedural hypothyroidism: Secondary | ICD-10-CM | POA: Diagnosis not present

## 2023-06-11 LAB — T4, FREE: Free T4: 1.33 ng/dL (ref 0.82–1.77)

## 2023-06-11 LAB — TSH: TSH: 0.582 u[IU]/mL (ref 0.450–4.500)

## 2023-06-17 ENCOUNTER — Encounter: Payer: Self-pay | Admitting: "Endocrinology

## 2023-06-17 ENCOUNTER — Ambulatory Visit (INDEPENDENT_AMBULATORY_CARE_PROVIDER_SITE_OTHER): Payer: Medicare Other | Admitting: "Endocrinology

## 2023-06-17 VITALS — BP 108/74 | HR 80 | Ht 70.5 in | Wt 211.0 lb

## 2023-06-17 DIAGNOSIS — E890 Postprocedural hypothyroidism: Secondary | ICD-10-CM | POA: Diagnosis not present

## 2023-06-17 NOTE — Progress Notes (Signed)
06/17/2023, 1:42 PM         Endocrinology follow-up note   Subjective:    Patient ID: Jerry Macdonald, male    DOB: 27-Nov-1930, PCP Benita Stabile, MD   Past Medical History:  Diagnosis Date   Abnormal prostate biopsy    Back pain    with radiculopathy   Cataracts, bilateral    Constipation    Family history of lung cancer    Family history of ovarian cancer    Family history of prostate cancer    Goiter    HOH (hard of hearing)    Hyperlipidemia    Hypertrophy of prostate    Malaise and fatigue    Obesity    Pyogenic granuloma    left cheeck   S/P colonoscopy June 2011   rectal bleeding secondary to hemorrhoids   S/P colonoscopy 2007   [facility], diverticulosis per Dr. Scharlene Gloss note   S/P endoscopy June 2011   Barrett's, mild gastritis and duodenitis   Seborrheic keratosis    Sleep apnea    Past Surgical History:  Procedure Laterality Date   BIOPSY  03/27/2019   Procedure: BIOPSY;  Surgeon: West Bali, MD;  [facility];  Service: Endoscopy;;   BIOPSY  08/16/2020   Procedure: BIOPSY;  Surgeon: Lanelle Bal, DO;  [facility];  Service: Endoscopy;;   COLONOSCOPY  JUN 2011   PAN-COLONIC DIVERTICULOSIS   [place]  [place]   [place] [facility]   COLONOSCOPY WITH PROPOFOL N/A 08/16/2020   Procedure: COLONOSCOPY WITH PROPOFOL;  Surgeon: Lanelle Bal, DO;  [facility];  Service: Endoscopy;  Laterality: N/A;  10:45am   ESOPHAGOGASTRODUODENOSCOPY  06/11/2011   Procedure: ESOPHAGOGASTRODUODENOSCOPY (EGD);  Surgeon: Arlyce Harman, MD;  [facility];  Service: Endoscopy;  Laterality: N/A;  11:00   ESOPHAGOGASTRODUODENOSCOPY N/A 05/21/2014   Dr. Fields:Barrett's esophagus/ Medium sized hiatal hernia. path with indefinite dysplasia.    ESOPHAGOGASTRODUODENOSCOPY N/A 12/20/2014   Dr. Jennell Corner gastric polyp/large HH/6 cm segment of Barrett's esophagus, path with  hyperplastic gastric polyp, no dysplasia. Surveillance April 2017 due    ESOPHAGOGASTRODUODENOSCOPY N/A 01/20/2016   Procedure: ESOPHAGOGASTRODUODENOSCOPY (EGD);  Surgeon: West Bali, MD;  [facility];  Service: Endoscopy;  Laterality: N/A;  1230pm   ESOPHAGOGASTRODUODENOSCOPY N/A 03/27/2019   Procedure: ESOPHAGOGASTRODUODENOSCOPY (EGD);  Surgeon: West Bali, MD;  [facility];  Service: Endoscopy;  Laterality: N/A;  12:15pm   HERNIA REPAIR     LAPAROSCOPIC PARTIAL COLECTOMY Right 09/28/2020   Procedure: LAPAROSCOPIC RIGHT HEMI COLECTOMY;  Surgeon: Lucretia Roers, MD;  Location: [facility];  Service: General;  Laterality: Right;   THYROIDECTOMY N/A 01/23/2021   Procedure: TOTAL THYROIDECTOMY;  Surgeon: Lucretia Roers, MD;  Location: [facility];  Service: General;  Laterality: N/A;   UMBILICAL HERNIA REPAIR     VARICOSE VEIN SURGERY     VENTRAL HERNIA REPAIR N/A 09/20/2022   Procedure: HERNIA REPAIR VENTRAL ADULT W/ MESH;  Surgeon: Lucretia Roers, MD;  Location: [facility];  Service: General;  Laterality: N/A;   Social History   Socioeconomic History   Marital status: Divorced    Spouse name: Not on  file   Number of children: Not on file   Years of education: Not on file   Highest education level: Not on file  Occupational History   Not on file  Tobacco Use   Smoking status: Former    Current packs/day: 0.00    Average packs/day: 0.5 packs/day for 2.0 years (1.0 ttl pk-yrs)    Types: Cigarettes    Start date: 09/09/1961    Quit date: 09/10/1963    Years since quitting: 59.8   Smokeless tobacco: Never   Tobacco comments:    quit in 1965  Vaping Use   Vaping status: Former  Substance and Sexual Activity   Alcohol use: Not Currently   Drug use: No   Sexual activity: Not Currently  Other Topics Concern   Not on file  Social History Narrative   Not on file   Social Determinants of Health   Financial Resource Strain: Not on file  Food Insecurity: Not  on file  Transportation Needs: No Transportation Needs (09/06/2020)   PRAPARE - Administrator, Civil Service (Medical): No    Lack of Transportation (Non-Medical): No  Physical Activity: Inactive (09/06/2020)   Exercise Vital Sign    Days of Exercise per Week: 0 days    Minutes of Exercise per Session: 0 min  Stress: Not on file  Social Connections: Not on file   Family History  Problem Relation Age of Onset   Prostate cancer Father 64       metastatic   Lung cancer Brother 67       heavy smoker   Prostate cancer Paternal Uncle        metastatic   Alzheimer's disease Maternal Grandmother    Prostate cancer Maternal Grandfather 82       metastatic   Prostate cancer Paternal Grandfather 78       metastatic   Ovarian cancer Paternal Aunt 43   Prostate cancer Paternal Uncle        metastatic   Cancer Cousin 66       unknown type, paternal first cousin   Liver cancer Cousin        dx 30s, paternal first cousin   Colon polyps Neg Hx    Colon cancer Neg Hx    Outpatient Encounter Medications as of 06/17/2023  Medication Sig   acetaminophen (TYLENOL) 500 MG tablet Take 500 mg by mouth every 8 (eight) hours as needed for moderate pain.   calcium-vitamin D (OSCAL WITH D) 500-5 MG-MCG tablet Take 1 tablet by mouth in the morning and at bedtime. Lunch and dinner   Cholecalciferol (VITAMIN D3) 25 MCG (1000 UT) CAPS Take 1 capsule by mouth 3 (three) times daily.   ferrous sulfate 324 MG TBEC Take 324 mg by mouth every other day.   finasteride (PROSCAR) 5 MG tablet Take 1 tablet (5 mg total) by mouth daily.   levothyroxine (SYNTHROID) 100 MCG tablet Take 1 tablet (100 mcg total) by mouth daily before breakfast.   magnesium oxide (MAG-OX) 400 MG tablet Take 400 mg by mouth 2 (two) times daily.   Multiple Vitamins-Minerals (MULTIVITAMINS THER. W/MINERALS) TABS Take 1 tablet by mouth daily.   ondansetron (ZOFRAN-ODT) 4 MG disintegrating tablet Take 1 tablet (4 mg total) by mouth  every 6 (six) hours as needed for nausea.   pantoprazole (PROTONIX) 40 MG tablet TAKE 1 TABLET ONCE A DAY BEFORE A MEAL, MAY TAKE SECOND TABLET BEFORE EVENING MEAL IF NEEDED   tamsulosin (FLOMAX)  0.4 MG CAPS capsule Take 1 capsule (0.4 mg total) by mouth daily.   traMADol (ULTRAM) 50 MG tablet Take 1 tablet (50 mg total) by mouth every 6 (six) hours as needed for moderate pain or severe pain.   No facility-administered encounter medications on file as of 06/17/2023.   ALLERGIES: Allergies  Allergen Reactions   Penicillins     Swelling of hands and feet - severe Did it involve swelling of the face/tongue/throat, SOB, or low BP? No Did it involve sudden or severe rash/hives, skin peeling, or any reaction on the inside of your mouth or nose? No Did you need to seek medical attention at a hospital or doctor's office? No When did it last happen?      1950s If all above answers are "NO", may proceed with cephalosporin use.    Penicillin G Other (See Comments)    VACCINATION STATUS: Immunization History  Administered Date(s) Administered   DTaP 06/03/2013   H1N1 08/12/2008   Influenza Whole 07/07/2007, 05/20/2008   Influenza-Unspecified 06/03/2013, 06/03/2018   Pneumococcal-Unspecified 06/03/2013, 06/03/2014   Varicella 06/03/2013    HPI Kshawn STEPHANO CAY is 89 y.o. male who is seen in follow-up for history of compressive multinodular goiter.    He underwent total thyroidectomy on Jan 23, 2021 by Dr. Larae Grooms.   His surgical sample is negative for malignancy.    - He reports feeling better, previous compressive symptoms have resolved.  He is currently on levothyroxine 100 mcg p.o. daily before breakfast.  He continues to tolerate this medication, reports reasonable consistency and compliance.     His previsit labs are consistent with appropriate replacement.     He is accompanied by  Leonides Grills his friend and power of attorney during this in person visit.   For his  postsurgical hypocalcemia, he remains on low-dose calcium/vitamin D supplements.   PMD:  Benita Stabile, MD.   He is not an optimal historian.  History is obtained mainly from chart review. he has been dealing with symptoms of fatigue, positional shortness of breath, sleep apnea for several years. He reports that he breathes better, swallows without difficulty. Marland Kitchen  He is a former smoker, denies any exposure to neck radiations. His other medical problems include BPH/prostate cancer, Barrett's esophagus, sleep apnea, and recently diagnosed cecal/colon cancer. He is cared for by an elderly friend [place] is also his power of attorney.   Review of Systems  Limited as above.  Objective:       06/17/2023    1:03 PM 05/16/2023   10:30 AM 03/25/2023    2:49 PM  Vitals with BMI  Height 5' 10.5"    Weight 211 lbs  209 lbs 13 oz  BMI 29.84    Systolic 108 125 160  Diastolic 74 73 66  Pulse 80 88 84    BP 108/74   Pulse 80   Ht 5' 10.5" (1.791 m)   Wt 211 lb (95.7 kg)   BMI 29.85 kg/m   Wt Readings from Last 3 Encounters:  06/17/23 211 lb (95.7 kg)  03/25/23 209 lb 12.8 oz (95.2 kg)  11/01/22 206 lb (93.4 kg)    Physical Exam    CMP ( most recent) CMP     Component Value Date/Time   NA 138 03/18/2023 1307   NA 143 06/04/2022 1122   K 4.3 03/18/2023 1307   CL 107 03/18/2023 1307   CO2 26 03/18/2023 1307   GLUCOSE 107 (H) 03/18/2023 1307  BUN 17 03/18/2023 1307   BUN 17 06/04/2022 1122   CREATININE 0.86 03/18/2023 1307   CALCIUM 8.8 (L) 03/18/2023 1307   CALCIUM 8.6 01/23/2021 1552   PROT 6.6 03/18/2023 1307   PROT 6.6 06/04/2022 1122   ALBUMIN 3.8 03/18/2023 1307   ALBUMIN 4.1 06/04/2022 1122   AST 27 03/18/2023 1307   ALT 20 03/18/2023 1307   ALKPHOS 48 03/18/2023 1307   BILITOT 2.1 (H) 03/18/2023 1307   BILITOT 1.3 (H) 06/04/2022 1122   GFRNONAA >60 03/18/2023 1307   GFRAA  02/03/2010 1249    >60        The eGFR has been calculated using the MDRD  equation. This calculation has not been validated in all clinical situations. eGFR's persistently <60 mL/min signify possible Chronic Kidney Disease.     Lipid Panel ( most recent) Lipid Panel     Component Value Date/Time   CHOL 139 08/12/2008 2244   TRIG 88 08/12/2008 2244   HDL 39 (L) 08/12/2008 2244   CHOLHDL 3.6 Ratio 08/12/2008 2244   VLDL 18 08/12/2008 2244   LDLCALC 82 08/12/2008 2244      Lab Results  Component Value Date   TSH 0.582 06/10/2023   TSH 0.653 06/04/2022   TSH 0.778 12/04/2021   TSH 7.190 (H) 07/28/2021   TSH 0.727 02/02/2021   TSH 0.103 (L) 01/19/2021   TSH 0.198 (L) 10/21/2020   TSH 0.075 (L) 09/06/2020   TSH 0.772 08/09/2007   FREET4 1.33 06/10/2023   FREET4 1.31 06/04/2022   FREET4 1.35 12/04/2021   FREET4 1.12 07/28/2021   FREET4 1.02 02/02/2021   FREET4 1.06 10/21/2020    September 14, 2020 thyroid ultrasound: Right lobe measures 13.8 x 6.7 x 6.4 cm with large 5.3 cm nodule which was biopsied and findings were benign. Left lobe measured 9.9 x 4.9 x 3.5 cm with 2 nodules measuring 4.8 cm Pseudonodule not biopsied and 1.5 cm-suspicious biopsied.  Fine-needle aspiration biopsy results of only thyroid nodule is available today showing benign follicular nodule.  Total thyroidectomy Jan 23, 2021. Surgical sample was significant for nodular hyperplasia bilaterally.  Assessment & Plan:   1.  Postsurgical hypothyroidism 2. Multinodular goiter 3.  Hypocalcemia  His compressive symptoms have resolved.  His previsit thyroid function tests are consistent with appropriate replacement.  He is advised to continue levothyroxine 100 mcg p.o. daily before breakfast.    - We discussed about the correct intake of his thyroid hormone, on empty stomach at fasting, with water, separated by at least 30 minutes from breakfast and other medications,  and separated by more than 4 hours from calcium, iron, multivitamins, acid reflux medications (PPIs). -Patient  is made aware of the fact that thyroid hormone replacement is needed for life, dose to be adjusted by periodic monitoring of thyroid function tests.    For his postsurgical mild hypocalcemia, he is advised to continue calcium/vitamin D  D 500- 200 1 tablet 3 times daily with lunch and supper.     - he is advised to maintain close follow up with Benita Stabile, MD for primary care needs.   I spent  22  minutes in the care of the patient today including review of labs from Thyroid Function, CMP, and other relevant labs ; imaging/biopsy records (current and previous including abstractions from other facilities); face-to-face time discussing  his lab results and symptoms, medications doses, his options of short and long term treatment based on the latest standards of care /  guidelines;   and documenting the encounter.  Jerry Macdonald  participated in the discussions, expressed understanding, and voiced agreement with the above plans.  All questions were answered to his satisfaction. he is encouraged to contact clinic should he have any questions or concerns prior to his return visit.   Follow up plan: Return in about 6 months (around 12/16/2023) for F/U with Pre-visit Labs.   Marquis Lunch, MD [facility] [address] [place], [place] [address] Phone: [phone]  Fax: [phone]     06/17/2023, 1:42 PM  This note was partially dictated with voice recognition software. Similar sounding words can be transcribed inadequately or may not  be corrected upon review.

## 2023-06-26 ENCOUNTER — Other Ambulatory Visit: Payer: Self-pay | Admitting: "Endocrinology

## 2023-06-26 DIAGNOSIS — E890 Postprocedural hypothyroidism: Secondary | ICD-10-CM

## 2023-07-03 DIAGNOSIS — H25812 Combined forms of age-related cataract, left eye: Secondary | ICD-10-CM | POA: Diagnosis not present

## 2023-07-03 DIAGNOSIS — H5703 Miosis: Secondary | ICD-10-CM | POA: Diagnosis not present

## 2023-07-03 DIAGNOSIS — H2181 Floppy iris syndrome: Secondary | ICD-10-CM | POA: Diagnosis not present

## 2023-07-03 DIAGNOSIS — H02831 Dermatochalasis of right upper eyelid: Secondary | ICD-10-CM | POA: Diagnosis not present

## 2023-07-03 DIAGNOSIS — H02834 Dermatochalasis of left upper eyelid: Secondary | ICD-10-CM | POA: Diagnosis not present

## 2023-07-11 ENCOUNTER — Other Ambulatory Visit: Payer: Medicare Other

## 2023-08-08 DIAGNOSIS — H353132 Nonexudative age-related macular degeneration, bilateral, intermediate dry stage: Secondary | ICD-10-CM | POA: Diagnosis not present

## 2023-08-08 DIAGNOSIS — H2513 Age-related nuclear cataract, bilateral: Secondary | ICD-10-CM | POA: Diagnosis not present

## 2023-08-08 DIAGNOSIS — H43822 Vitreomacular adhesion, left eye: Secondary | ICD-10-CM | POA: Diagnosis not present

## 2023-08-08 DIAGNOSIS — H353211 Exudative age-related macular degeneration, right eye, with active choroidal neovascularization: Secondary | ICD-10-CM | POA: Diagnosis not present

## 2023-08-08 DIAGNOSIS — H43821 Vitreomacular adhesion, right eye: Secondary | ICD-10-CM | POA: Diagnosis not present

## 2023-08-12 ENCOUNTER — Telehealth: Payer: Self-pay

## 2023-08-12 NOTE — Telephone Encounter (Signed)
Patient needing refills on:  finasteride (PROSCAR) 5 MG tablet   Pharmacy:  [facility] - [place], [place] - [address] Phone: [phone]  Fax: [phone]

## 2023-08-12 NOTE — Telephone Encounter (Signed)
Pt was called but was unable to leave a voicemail.

## 2023-08-13 NOTE — Telephone Encounter (Signed)
Ms Charm Barges made aware the patient had refills on his prescription. Ms. Charm Barges voiced understanding and will reach out to pharmacy.

## 2023-08-22 DIAGNOSIS — H25812 Combined forms of age-related cataract, left eye: Secondary | ICD-10-CM | POA: Diagnosis not present

## 2023-08-22 DIAGNOSIS — H2512 Age-related nuclear cataract, left eye: Secondary | ICD-10-CM | POA: Diagnosis not present

## 2023-08-22 DIAGNOSIS — H2589 Other age-related cataract: Secondary | ICD-10-CM | POA: Diagnosis not present

## 2023-09-30 ENCOUNTER — Ambulatory Visit (HOSPITAL_COMMUNITY)
Admission: RE | Admit: 2023-09-30 | Discharge: 2023-09-30 | Disposition: A | Discharge status: Home / Self Care | Payer: Medicare Other | Source: Ambulatory Visit | Attending: Hematology | Admitting: Hematology

## 2023-09-30 ENCOUNTER — Inpatient Hospital Stay: Payer: Medicare Other | Attending: Hematology

## 2023-09-30 DIAGNOSIS — D509 Iron deficiency anemia, unspecified: Secondary | ICD-10-CM

## 2023-09-30 DIAGNOSIS — K5730 Diverticulosis of large intestine without perforation or abscess without bleeding: Secondary | ICD-10-CM | POA: Diagnosis not present

## 2023-09-30 DIAGNOSIS — Z85038 Personal history of other malignant neoplasm of large intestine: Secondary | ICD-10-CM | POA: Insufficient documentation

## 2023-09-30 DIAGNOSIS — C180 Malignant neoplasm of cecum: Secondary | ICD-10-CM

## 2023-09-30 DIAGNOSIS — K4020 Bilateral inguinal hernia, without obstruction or gangrene, not specified as recurrent: Secondary | ICD-10-CM | POA: Diagnosis not present

## 2023-09-30 DIAGNOSIS — D649 Anemia, unspecified: Secondary | ICD-10-CM | POA: Diagnosis not present

## 2023-09-30 DIAGNOSIS — C61 Malignant neoplasm of prostate: Secondary | ICD-10-CM | POA: Insufficient documentation

## 2023-09-30 DIAGNOSIS — K8020 Calculus of gallbladder without cholecystitis without obstruction: Secondary | ICD-10-CM | POA: Diagnosis not present

## 2023-09-30 DIAGNOSIS — K449 Diaphragmatic hernia without obstruction or gangrene: Secondary | ICD-10-CM | POA: Diagnosis not present

## 2023-09-30 LAB — CBC WITH DIFFERENTIAL/PLATELET
Abs Immature Granulocytes: 0.04 10*3/uL (ref 0.00–0.07)
Basophils Absolute: 0 10*3/uL (ref 0.0–0.1)
Basophils Relative: 0 %
Eosinophils Absolute: 0.1 10*3/uL (ref 0.0–0.5)
Eosinophils Relative: 2 %
HCT: 45.5 % (ref 39.0–52.0)
Hemoglobin: 15.1 g/dL (ref 13.0–17.0)
Immature Granulocytes: 1 %
Lymphocytes Relative: 22 %
Lymphs Abs: 1.4 10*3/uL (ref 0.7–4.0)
MCH: 33.3 pg (ref 26.0–34.0)
MCHC: 33.2 g/dL (ref 30.0–36.0)
MCV: 100.4 fL — ABNORMAL HIGH (ref 80.0–100.0)
Monocytes Absolute: 0.8 10*3/uL (ref 0.1–1.0)
Monocytes Relative: 12 %
Neutro Abs: 4.1 10*3/uL (ref 1.7–7.7)
Neutrophils Relative %: 63 %
Platelets: 188 10*3/uL (ref 150–400)
RBC: 4.53 MIL/uL (ref 4.22–5.81)
RDW: 12.5 % (ref 11.5–15.5)
WBC: 6.5 10*3/uL (ref 4.0–10.5)
nRBC: 0 % (ref 0.0–0.2)

## 2023-09-30 LAB — COMPREHENSIVE METABOLIC PANEL
ALT: 18 U/L (ref 0–44)
AST: 25 U/L (ref 15–41)
Albumin: 4 g/dL (ref 3.5–5.0)
Alkaline Phosphatase: 59 U/L (ref 38–126)
Anion gap: 6 (ref 5–15)
BUN: 17 mg/dL (ref 8–23)
CO2: 29 mmol/L (ref 22–32)
Calcium: 9.2 mg/dL (ref 8.9–10.3)
Chloride: 101 mmol/L (ref 98–111)
Creatinine, Ser: 0.81 mg/dL (ref 0.61–1.24)
GFR, Estimated: >60 mL/min (ref >60)
Glucose, Bld: 104 mg/dL — ABNORMAL HIGH (ref 70–99)
Potassium: 4.2 mmol/L (ref 3.5–5.1)
Sodium: 136 mmol/L (ref 135–145)
Total Bilirubin: 1.9 mg/dL — ABNORMAL HIGH (ref 0.0–1.2)
Total Protein: 7.2 g/dL (ref 6.5–8.1)

## 2023-09-30 LAB — IRON AND TIBC
Iron: 127 ug/dL (ref 45–182)
Saturation Ratios: 42 % — ABNORMAL HIGH (ref 17.9–39.5)
TIBC: 305 ug/dL (ref 250–450)
UIBC: 178 ug/dL

## 2023-09-30 LAB — FERRITIN: Ferritin: 44 ng/mL (ref 24–336)

## 2023-09-30 MED ORDER — IOHEXOL 300 MG/ML  SOLN
100.0000 mL | Freq: Once | INTRAMUSCULAR | Status: AC | PRN
  Administered 2023-09-30: 100 mL via INTRAVENOUS

## 2023-10-01 LAB — CEA: CEA: 2.8 ng/mL (ref 0.0–4.7)

## 2023-10-06 NOTE — Progress Notes (Signed)
[facility] [address], [place] [address]    Clinic Day:  10/07/2023  Referring physician: Benita Stabile, MD  Patient Care Team: Benita Stabile, MD as PCP - General (Internal Medicine) West Bali, MD (Inactive) (Gastroenterology) Lanelle Bal, DO as Consulting Physician (Internal Medicine)   ASSESSMENT & PLAN:   Assessment: 1.  Cecal adenocarcinoma, MMR preserved: -Colonoscopy by Dr. Marletta Lor on 08/16/2020 with fungating/infiltrative submucosal and ulcerated partially obstructing mass found in the cecum and at the ileocecal valve. -Biopsy consistent with adenocarcinoma, MMR preserved. -CT CAP on 08/22/2020 with cycle carcinoma with small ileocolic mesenteric lymph nodes with no evidence of distant metastatic spread. - 09/18/2020: Right colectomy, margins negative, 0/20 lymph nodes involved, pT3 pN0   2. Social/family history: -He is retired from [employer]. He had asbestos exposure. -Maternal and paternal grandfathers died of prostate cancer. Father died of prostate cancer. 2 paternal uncles had prostate cancer.    Plan: 1.  Stage II (PT3PN0) cecal adenocarcinoma, MMR preserved: - Denies any change in bowel habits.  He noticed bleeding per rectum last night and this morning it has gotten better.  I reviewed his previous colonoscopy which showed internal hemorrhoids.  He does not have any signs or symptoms of diverticulitis. - If he continues to have bleeding, recommend that he follow-up with Dr. Marletta Lor for repeat colonoscopy. - Reviewed labs from 09/30/2023: Normal LFTs.  CBC grossly normal.  CEA is 2.8. - CTAP on 09/30/2023: No evidence of recurrence.  Other benign findings were discussed. - Recommend follow-up in 6 months with repeat labs including CEA.  Imaging will be done in 1 year.   2. Prostate cancer: - Continue to follow-up with Dr. Annabell Howells.  Last PSA was 5.7.   3. Family history: - Previously discussed genetic testing which was refused.   4.  Normocytic anemia: - Continue iron tablet Monday, Wednesday and Friday.  Ferritin is 44.  Hemoglobin is normal.    Orders Placed This Encounter  Procedures   CBC with Differential    Standing Status:   Future    Expected Date:   03/30/2024    Expiration Date:   10/06/2024   Comprehensive metabolic panel    Standing Status:   Future    Expected Date:   03/30/2024    Expiration Date:   10/06/2024   CEA    Standing Status:   Future    Expected Date:   03/30/2024    Expiration Date:   10/06/2024   Iron and TIBC ([facility] ONLY)    Standing Status:   Future    Expected Date:   03/30/2024    Expiration Date:   10/06/2024   Ferritin    Standing Status:   Future    Expected Date:   03/30/2024    Expiration Date:   10/06/2024      I,Katie Daubenspeck,acting as a scribe for [name], MD.,have documented all relevant documentation on the behalf of [name], MD,as directed by  [name], MD while in the presence of [name], MD.   I, [name] MD, have reviewed the above documentation for accuracy and completeness, and I agree with the above.   [name], MD   2/3/202511:37 AM  CHIEF COMPLAINT:   Diagnosis: cecal cancer, iron deficiency anemia   Cancer Staging  No matching staging information was found for the patient.    Prior Therapy: Laparoscopic right hemicolectomy on 09/28/2020   Current Therapy:  Oral iron  tablet M/W/F    HISTORY OF PRESENT ILLNESS:   Oncology History   No history exists.     INTERVAL HISTORY:   Jerry Macdonald is a 89 y.o. male presenting to clinic today for follow up of cecal cancer, iron deficiency anemia. He was last seen by me on 03/25/23.  Since his last visit, he underwent surveillance CT A/P on 09/30/23 showing no evidence of local recurrence or metastatic disease.  Today, he states that he is doing well overall. His appetite level is at 85%. His energy level is at 70%.  PAST  MEDICAL HISTORY:   Past Medical History: Past Medical History:  Diagnosis Date   Abnormal prostate biopsy    Back pain    with radiculopathy   Cataracts, bilateral    Constipation    Family history of lung cancer    Family history of ovarian cancer    Family history of prostate cancer    Goiter    HOH (hard of hearing)    Hyperlipidemia    Hypertrophy of prostate    Malaise and fatigue    Obesity    Pyogenic granuloma    left cheeck   S/P colonoscopy June 2011   rectal bleeding secondary to hemorrhoids   S/P colonoscopy 2007   [facility], diverticulosis per Dr. Scharlene Gloss note   S/P endoscopy June 2011   Barrett's, mild gastritis and duodenitis   Seborrheic keratosis    Sleep apnea     Surgical History: Past Surgical History:  Procedure Laterality Date   BIOPSY  03/27/2019   Procedure: BIOPSY;  Surgeon: West Bali, MD;  [facility];  Service: Endoscopy;;   BIOPSY  08/16/2020   Procedure: BIOPSY;  Surgeon: Lanelle Bal, DO;  [facility];  Service: Endoscopy;;   COLONOSCOPY  JUN 2011   PAN-COLONIC DIVERTICULOSIS   COLONOSCOPY  2007   [facility]   COLONOSCOPY WITH PROPOFOL N/A 08/16/2020   Procedure: COLONOSCOPY WITH PROPOFOL;  Surgeon: Lanelle Bal, DO;  [facility];  Service: Endoscopy;  Laterality: N/A;  10:45am   ESOPHAGOGASTRODUODENOSCOPY  06/11/2011   Procedure: ESOPHAGOGASTRODUODENOSCOPY (EGD);  Surgeon: Arlyce Harman, MD;  [facility];  Service: Endoscopy;  Laterality: N/A;  11:00   ESOPHAGOGASTRODUODENOSCOPY N/A 05/21/2014   Dr. Fields:Barrett's esophagus/ Medium sized hiatal hernia. path with indefinite dysplasia.    ESOPHAGOGASTRODUODENOSCOPY N/A 12/20/2014   Dr. Jennell Corner gastric polyp/large HH/6 cm segment of Barrett's esophagus, path with hyperplastic gastric polyp, no dysplasia. Surveillance April 2017 due    ESOPHAGOGASTRODUODENOSCOPY N/A 01/20/2016   Procedure: ESOPHAGOGASTRODUODENOSCOPY (EGD);   Surgeon: West Bali, MD;  [facility];  Service: Endoscopy;  Laterality: N/A;  1230pm   ESOPHAGOGASTRODUODENOSCOPY N/A 03/27/2019   Procedure: ESOPHAGOGASTRODUODENOSCOPY (EGD);  Surgeon: West Bali, MD;  [facility]: [facility];  Service: Endoscopy;  Laterality: N/A;  12:15pm   HERNIA REPAIR     LAPAROSCOPIC PARTIAL COLECTOMY Right 09/28/2020   Procedure: LAPAROSCOPIC RIGHT HEMI COLECTOMY;  Surgeon: Lucretia Roers, MD;  Location: [facility];  Service: General;  Laterality: Right;   THYROIDECTOMY N/A 01/23/2021   Procedure: TOTAL THYROIDECTOMY;  Surgeon: Lucretia Roers, MD;  Location: [facility];  Service: General;  Laterality: N/A;   UMBILICAL HERNIA REPAIR     VARICOSE VEIN SURGERY     VENTRAL HERNIA REPAIR N/A 09/20/2022   Procedure: HERNIA REPAIR VENTRAL ADULT W/ MESH;  Surgeon: Lucretia Roers, MD;  Location: [facility];  Service: General;  Laterality: N/A;    Social History: Social History   Socioeconomic History   Marital status: Divorced    Spouse name: Not on file   Number of children: Not on file   Years of education: Not on file   Highest education level: Not on file  Occupational History   Not on file  Tobacco Use   Smoking status: Former    Current packs/day: 0.00    Average packs/day: 0.5 packs/day for 2.0 years (1.0 ttl pk-yrs)    Types: Cigarettes    Start date: 09/09/1961    Quit date: 09/10/1963    Years since quitting: 60.1   Smokeless tobacco: Never   Tobacco comments:    quit in 1965  Vaping Use   Vaping status: Former  Substance and Sexual Activity   Alcohol use: Not Currently   Drug use: No   Sexual activity: Not Currently  Other Topics Concern   Not on file  Social History Narrative   Not on file   Social Drivers of Health   Financial Resource Strain: Not on file  Food Insecurity: Not on file  Transportation Needs: No Transportation Needs (09/06/2020)   PRAPARE - Administrator, Civil Service (Medical): No    Lack of  Transportation (Non-Medical): No  Physical Activity: Inactive (09/06/2020)   Exercise Vital Sign    Days of Exercise per Week: 0 days    Minutes of Exercise per Session: 0 min  Stress: Not on file  Social Connections: Not on file  Intimate Partner Violence: Not At Risk (09/06/2020)   Humiliation, Afraid, Rape, and Kick questionnaire    Fear of Current or Ex-Partner: No    Emotionally Abused: No    Physically Abused: No    Sexually Abused: No    Family History: Family History  Problem Relation Age of Onset   Prostate cancer Father 67       metastatic   Lung cancer Brother 45       heavy smoker   Prostate cancer Paternal Uncle        metastatic   Alzheimer's disease Maternal Grandmother    Prostate cancer Maternal Grandfather 82       metastatic   Prostate cancer Paternal Grandfather 71       metastatic   Ovarian cancer Paternal Aunt 53   Prostate cancer Paternal Uncle        metastatic   Cancer Cousin 39       unknown type, paternal first cousin   Liver cancer Cousin        dx 30s, paternal first cousin   Colon polyps Neg Hx    Colon cancer Neg Hx     Current Medications:  Current Outpatient Medications:    acetaminophen (TYLENOL) 500 MG tablet, Take 500 mg by mouth every 8 (eight) hours as needed for moderate pain., Disp: , Rfl:    calcium-vitamin D (OSCAL WITH D) 500-5 MG-MCG tablet, Take 1 tablet by mouth in the morning and at bedtime. Lunch and dinner, Disp: 180 tablet, Rfl: 1   Cholecalciferol (VITAMIN D3) 25 MCG (1000 UT) CAPS, Take 1 capsule by mouth 3 (three) times daily., Disp: , Rfl:    ferrous sulfate 324 MG TBEC, Take 324 mg by mouth every other day., Disp: , Rfl:    finasteride (PROSCAR) 5 MG tablet, Take 1 tablet (5 mg total) by mouth daily., Disp: 90 tablet, Rfl: 3   levothyroxine (SYNTHROID) 100 MCG tablet, TAKE 1 TABLET DAILY  BEFORE BREAKFAST, Disp: 90 tablet, Rfl: 3   magnesium oxide (MAG-OX) 400 MG tablet, Take 400 mg by mouth 2 (two) times daily., Disp:  , Rfl:    Multiple Vitamins-Minerals (MULTIVITAMINS THER. W/MINERALS) TABS, Take 1 tablet by mouth daily., Disp: , Rfl:    ondansetron (ZOFRAN-ODT) 4 MG disintegrating tablet, Take 1 tablet (4 mg total) by mouth every 6 (six) hours as needed for nausea., Disp: 20 tablet, Rfl: 0   pantoprazole (PROTONIX) 40 MG tablet, TAKE 1 TABLET ONCE A DAY BEFORE A MEAL, MAY TAKE SECOND TABLET BEFORE EVENING MEAL IF NEEDED, Disp: 180 tablet, Rfl: 3   tamsulosin (FLOMAX) 0.4 MG CAPS capsule, Take 1 capsule (0.4 mg total) by mouth daily., Disp: 90 capsule, Rfl: 3   traMADol (ULTRAM) 50 MG tablet, Take 1 tablet (50 mg total) by mouth every 6 (six) hours as needed for moderate pain or severe pain., Disp: 15 tablet, Rfl: 0   Allergies: Allergies  Allergen Reactions   Penicillins     Swelling of hands and feet - severe Did it involve swelling of the face/tongue/throat, SOB, or low BP? No Did it involve sudden or severe rash/hives, skin peeling, or any reaction on the inside of your mouth or nose? No Did you need to seek medical attention at a hospital or doctor's office? No When did it last happen?      1950s If all above answers are "NO", may proceed with cephalosporin use.    Penicillin G Other (See Comments)    REVIEW OF SYSTEMS:   Review of Systems  Constitutional:  Negative for chills, fatigue and fever.  HENT:   Negative for lump/mass, mouth sores, nosebleeds, sore throat and trouble swallowing.   Eyes:  Negative for eye problems.  Respiratory:  Negative for cough and shortness of breath.   Cardiovascular:  Negative for chest pain, leg swelling and palpitations.  Gastrointestinal:  Positive for blood in stool. Negative for abdominal pain, constipation, diarrhea, nausea and vomiting.  Genitourinary:  Negative for bladder incontinence, difficulty urinating, dysuria, frequency, hematuria and nocturia.   Musculoskeletal:  Negative for arthralgias, back pain, flank pain, myalgias and neck pain.  Skin:   Negative for itching and rash.  Neurological:  Negative for dizziness, headaches and numbness.  Hematological:  Does not bruise/bleed easily.  Psychiatric/Behavioral:  Negative for depression, sleep disturbance and suicidal ideas. The patient is not nervous/anxious.   All other systems reviewed and are negative.    VITALS:   Blood pressure 127/80, pulse 99, temperature (!) 96.9 F (36.1 C), temperature source Tympanic, resp. rate 18, weight 202 lb 13.2 oz (92 kg), SpO2 98%.  Wt Readings from Last 3 Encounters:  10/07/23 202 lb 13.2 oz (92 kg)  06/17/23 211 lb (95.7 kg)  03/25/23 209 lb 12.8 oz (95.2 kg)    Body mass index is 28.69 kg/m.  Performance status (ECOG): 1 - Symptomatic but completely ambulatory  PHYSICAL EXAM:   Physical Exam Vitals and nursing note reviewed. Exam conducted with a chaperone present.  Constitutional:      Appearance: Normal appearance.  Cardiovascular:     Rate and Rhythm: Normal rate and regular rhythm.     Pulses: Normal pulses.     Heart sounds: Normal heart sounds.  Pulmonary:     Effort: Pulmonary effort is normal.     Breath sounds: Normal breath sounds.  Abdominal:     Palpations: Abdomen is soft. There is no hepatomegaly, splenomegaly or mass.     Tenderness: There  is no abdominal tenderness.  Musculoskeletal:     Right lower leg: No edema.     Left lower leg: No edema.  Lymphadenopathy:     Cervical: No cervical adenopathy.     Right cervical: No superficial, deep or posterior cervical adenopathy.    Left cervical: No superficial, deep or posterior cervical adenopathy.     Upper Body:     Right upper body: No supraclavicular or axillary adenopathy.     Left upper body: No supraclavicular or axillary adenopathy.  Neurological:     General: No focal deficit present.     Mental Status: He is alert and oriented to person, place, and time.  Psychiatric:        Mood and Affect: Mood normal.        Behavior: Behavior normal.      LABS:   CBC     Component Value Date/Time   WBC 6.5 09/30/2023 0859   RBC 4.53 09/30/2023 0859   HGB 15.1 09/30/2023 0859   HCT 45.5 09/30/2023 0859   PLT 188 09/30/2023 0859   MCV 100.4 (H) 09/30/2023 0859   MCH 33.3 09/30/2023 0859   MCHC 33.2 09/30/2023 0859   RDW 12.5 09/30/2023 0859   LYMPHSABS 1.4 09/30/2023 0859   MONOABS 0.8 09/30/2023 0859   EOSABS 0.1 09/30/2023 0859   BASOSABS 0.0 09/30/2023 0859    CMP      Component Value Date/Time   NA 136 09/30/2023 0859   NA 143 06/04/2022 1122   K 4.2 09/30/2023 0859   CL 101 09/30/2023 0859   CO2 29 09/30/2023 0859   GLUCOSE 104 (H) 09/30/2023 0859   BUN 17 09/30/2023 0859   BUN 17 06/04/2022 1122   CREATININE 0.81 09/30/2023 0859   CALCIUM 9.2 09/30/2023 0859   CALCIUM 8.6 01/23/2021 1552   PROT 7.2 09/30/2023 0859   PROT 6.6 06/04/2022 1122   ALBUMIN 4.0 09/30/2023 0859   ALBUMIN 4.1 06/04/2022 1122   AST 25 09/30/2023 0859   ALT 18 09/30/2023 0859   ALKPHOS 59 09/30/2023 0859   BILITOT 1.9 (H) 09/30/2023 0859   BILITOT 1.3 (H) 06/04/2022 1122   GFRNONAA >60 09/30/2023 0859   GFRAA  02/03/2010 1249    >60        The eGFR has been calculated using the MDRD equation. This calculation has not been validated in all clinical situations. eGFR's persistently <60 mL/min signify possible Chronic Kidney Disease.     Lab Results  Component Value Date   CEA1 2.8 09/30/2023   /  CEA  Date Value Ref Range Status  09/30/2023 2.8 0.0 - 4.7 ng/mL Final    Comment:    (NOTE)                             Nonsmokers          <3.9                             Smokers             <5.6 Roche [employer] Electrochemiluminescence Immunoassay (ECLIA) Values obtained with different assay methods or kits cannot be used interchangeably.  Results cannot be interpreted as absolute evidence of the presence or absence of malignant disease. Performed At: [facility] [address] [place], [place]  [id number] Jerry Schimke MD UV:[id number]    Lab Results  Component  Value Date   PSA1 5.7 (H) 05/09/2023   No results found for: "ZOX096" No results found for: "CAN125"  No results found for: "TOTALPROTELP", "ALBUMINELP", "A1GS", "A2GS", "BETS", "BETA2SER", "GAMS", "MSPIKE", "SPEI" Lab Results  Component Value Date   TIBC 305 09/30/2023   TIBC 290 03/18/2023   TIBC 268 09/10/2022   FERRITIN 44 09/30/2023   FERRITIN 36 03/18/2023   FERRITIN 37 09/10/2022   IRONPCTSAT 42 (H) 09/30/2023   IRONPCTSAT 49 (H) 03/18/2023   IRONPCTSAT 44 (H) 09/10/2022   No results found for: "LDH"   STUDIES:   CT ABDOMEN PELVIS W CONTRAST Result Date: 10/06/2023 CLINICAL DATA:  History of cecal adenocarcinoma, monitor. * Tracking Code: BO * EXAM: CT ABDOMEN AND PELVIS WITH CONTRAST TECHNIQUE: Multidetector CT imaging of the abdomen and pelvis was performed using the standard protocol following bolus administration of intravenous contrast. RADIATION DOSE REDUCTION: This exam was performed according to the departmental dose-optimization program which includes automated exposure control, adjustment of the mA and/or kV according to patient size and/or use of iterative reconstruction technique. CONTRAST:  OMNIPAQUE IOHEXOL 300 MG/ML  SOLN COMPARISON:  Multiple priors including most recent CT September 10, 2022 FINDINGS: Lower chest: Calcified pleural plaques compatible with prior asbestos exposure. Hepatobiliary: Stable 19 mm cyst in the left lobe of the liver. No new suspicious hepatic lesion. Cholelithiasis without findings of acute cholecystitis. No biliary ductal dilation. Pancreas: No pancreatic ductal dilation or evidence of acute inflammation. Spleen: No splenomegaly. Adrenals/Urinary Tract: Bilateral adrenal glands appear normal. Pelvic ectopic right kidney. No left hydronephrosis. Prominence of the left ureter a portion of which is contained within a right inguinal hernia. Kidneys demonstrate symmetric  enhancement. Urinary bladder is unremarkable for degree of distension. Stomach/Bowel: Radiopaque enteric contrast material reaches the splenic flexure. Moderate-sized hiatal hernia. No pathologic dilation of small or large bowel. Colonic diverticulosis without findings of acute diverticulitis. Prior right hemicolectomy without evidence of local recurrence. Vascular/Lymphatic: Aortic atherosclerosis. Normal caliber abdominal aorta. Decompressed IVC. No pathologically enlarged abdominal or pelvic lymph nodes. Reproductive: Enlarged prostate gland. Other: Large right inguinal hernia contains fat and dilated portion of right ureter. Fat containing left inguinal hernia. Musculoskeletal: Multilevel degenerative change of the spine with multifocal Schmorl's node formation. No aggressive lytic or blastic lesion of bone. Diffuse demineralization of bone. IMPRESSION: 1. Prior right hemicolectomy without evidence of local recurrence or metastatic disease in the abdomen or pelvis. 2. Large right inguinal hernia contains fat and dilated portion of right ureter. 3. Cholelithiasis without findings of acute cholecystitis. 4. Colonic diverticulosis without findings of acute diverticulitis. 5. Moderate-sized hiatal hernia. 6.  Aortic Atherosclerosis (ICD10-I70.0). Electronically Signed   By: Maudry Mayhew M.D.   On: 10/06/2023 08:55

## 2023-10-07 ENCOUNTER — Telehealth: Payer: Self-pay

## 2023-10-07 ENCOUNTER — Inpatient Hospital Stay: Payer: Medicare Other | Attending: Hematology | Admitting: Hematology

## 2023-10-07 VITALS — BP 127/80 | HR 99 | Temp 96.9°F | Resp 18 | Wt 202.8 lb

## 2023-10-07 DIAGNOSIS — Z800 Family history of malignant neoplasm of digestive organs: Secondary | ICD-10-CM | POA: Insufficient documentation

## 2023-10-07 DIAGNOSIS — Z801 Family history of malignant neoplasm of trachea, bronchus and lung: Secondary | ICD-10-CM | POA: Insufficient documentation

## 2023-10-07 DIAGNOSIS — C180 Malignant neoplasm of cecum: Secondary | ICD-10-CM | POA: Diagnosis not present

## 2023-10-07 DIAGNOSIS — D509 Iron deficiency anemia, unspecified: Secondary | ICD-10-CM | POA: Diagnosis not present

## 2023-10-07 DIAGNOSIS — Z77090 Contact with and (suspected) exposure to asbestos: Secondary | ICD-10-CM | POA: Diagnosis not present

## 2023-10-07 DIAGNOSIS — D649 Anemia, unspecified: Secondary | ICD-10-CM | POA: Diagnosis not present

## 2023-10-07 DIAGNOSIS — R7301 Impaired fasting glucose: Secondary | ICD-10-CM | POA: Diagnosis not present

## 2023-10-07 DIAGNOSIS — Z9049 Acquired absence of other specified parts of digestive tract: Secondary | ICD-10-CM | POA: Diagnosis not present

## 2023-10-07 DIAGNOSIS — Z87891 Personal history of nicotine dependence: Secondary | ICD-10-CM | POA: Diagnosis not present

## 2023-10-07 DIAGNOSIS — E039 Hypothyroidism, unspecified: Secondary | ICD-10-CM | POA: Diagnosis not present

## 2023-10-07 DIAGNOSIS — Z8041 Family history of malignant neoplasm of ovary: Secondary | ICD-10-CM | POA: Diagnosis not present

## 2023-10-07 DIAGNOSIS — Z85038 Personal history of other malignant neoplasm of large intestine: Secondary | ICD-10-CM | POA: Insufficient documentation

## 2023-10-07 DIAGNOSIS — Z8042 Family history of malignant neoplasm of prostate: Secondary | ICD-10-CM | POA: Insufficient documentation

## 2023-10-07 DIAGNOSIS — C61 Malignant neoplasm of prostate: Secondary | ICD-10-CM | POA: Insufficient documentation

## 2023-10-07 NOTE — Telephone Encounter (Signed)
Pt needs an appt to be seen per Dr Ellin Saba. Pt has not been seen in over 3 years and pt asks that we dont call him but call Leonides Grills @ [phone] because he doesn't hear well

## 2023-10-07 NOTE — Patient Instructions (Signed)
[facility] at [facility] Discharge Instructions   You were seen and examined today by Dr. Ellin Saba.  He reviewed the results of your lab work which are normal/stable.   He reviewed the results of your CT scan which did not show any evidence of cancer.   We will see you back in 6 months. We will repeat lab work prior to this visit.    Return as scheduled.    Thank you for choosing [facility] at  [facility] to provide your oncology and hematology care.  To afford each patient quality time with our provider, please arrive at least 15 minutes before your scheduled appointment time.   If you have a lab appointment with the Cancer Center please come in thru the Main Entrance and check in at the main information desk.  You need to re-schedule your appointment should you arrive 10 or more minutes late.  We strive to give you quality time with our providers, and arriving late affects you and other patients whose appointments are after yours.  Also, if you no show three or more times for appointments you may be dismissed from the clinic at the providers discretion.     Again, thank you for choosing [facility].  Our hope is that these requests will decrease the amount of time that you wait before being seen by our physicians.       _____________________________________________________________  Should you have questions after your visit to [facility], please contact our office at [phone] and follow the prompts.  Our office hours are 8:00 a.m. and 4:30 p.m. Monday - Friday.  Please note that voicemails left after 4:00 p.m. may not be returned until the following business day.  We are closed weekends and major holidays.  You do have access to a nurse 24-7, just call the main number to the clinic [phone] and do not press any options, hold on the line and a nurse will answer the phone.    For prescription refill  requests, have your pharmacy contact our office and allow 72 hours.    Due to Covid, you will need to wear a mask upon entering the hospital. If you do not have a mask, a mask will be given to you at the Main Entrance upon arrival. For doctor visits, patients may have 1 support person age 19 or older with them. For treatment visits, patients can not have anyone with them due to social distancing guidelines and our immunocompromised population.

## 2023-10-10 DIAGNOSIS — H353211 Exudative age-related macular degeneration, right eye, with active choroidal neovascularization: Secondary | ICD-10-CM | POA: Diagnosis not present

## 2023-10-10 DIAGNOSIS — H43823 Vitreomacular adhesion, bilateral: Secondary | ICD-10-CM | POA: Diagnosis not present

## 2023-10-10 DIAGNOSIS — H353132 Nonexudative age-related macular degeneration, bilateral, intermediate dry stage: Secondary | ICD-10-CM | POA: Diagnosis not present

## 2023-10-10 DIAGNOSIS — H2513 Age-related nuclear cataract, bilateral: Secondary | ICD-10-CM | POA: Diagnosis not present

## 2023-10-14 DIAGNOSIS — E039 Hypothyroidism, unspecified: Secondary | ICD-10-CM | POA: Diagnosis not present

## 2023-10-14 DIAGNOSIS — H6123 Impacted cerumen, bilateral: Secondary | ICD-10-CM | POA: Diagnosis not present

## 2023-10-14 DIAGNOSIS — E663 Overweight: Secondary | ICD-10-CM | POA: Diagnosis not present

## 2023-10-14 DIAGNOSIS — D649 Anemia, unspecified: Secondary | ICD-10-CM | POA: Diagnosis not present

## 2023-10-14 DIAGNOSIS — C61 Malignant neoplasm of prostate: Secondary | ICD-10-CM | POA: Diagnosis not present

## 2023-10-14 DIAGNOSIS — R7301 Impaired fasting glucose: Secondary | ICD-10-CM | POA: Diagnosis not present

## 2023-10-14 DIAGNOSIS — M545 Low back pain, unspecified: Secondary | ICD-10-CM | POA: Diagnosis not present

## 2023-10-14 DIAGNOSIS — G4733 Obstructive sleep apnea (adult) (pediatric): Secondary | ICD-10-CM | POA: Diagnosis not present

## 2023-10-14 DIAGNOSIS — Z6829 Body mass index (BMI) 29.0-29.9, adult: Secondary | ICD-10-CM | POA: Diagnosis not present

## 2023-10-14 DIAGNOSIS — M25562 Pain in left knee: Secondary | ICD-10-CM | POA: Diagnosis not present

## 2023-10-24 DIAGNOSIS — H2511 Age-related nuclear cataract, right eye: Secondary | ICD-10-CM | POA: Diagnosis not present

## 2023-10-29 DIAGNOSIS — H2511 Age-related nuclear cataract, right eye: Secondary | ICD-10-CM | POA: Diagnosis not present

## 2023-11-11 ENCOUNTER — Other Ambulatory Visit: Payer: Medicare Other

## 2023-11-11 DIAGNOSIS — C61 Malignant neoplasm of prostate: Secondary | ICD-10-CM | POA: Diagnosis not present

## 2023-11-12 LAB — PSA: Prostate Specific Ag, Serum: 7.1 ng/mL — ABNORMAL HIGH (ref 0.0–4.0)

## 2023-11-13 ENCOUNTER — Other Ambulatory Visit: Payer: Medicare Other

## 2023-11-14 DIAGNOSIS — H43822 Vitreomacular adhesion, left eye: Secondary | ICD-10-CM | POA: Diagnosis not present

## 2023-11-14 DIAGNOSIS — H353132 Nonexudative age-related macular degeneration, bilateral, intermediate dry stage: Secondary | ICD-10-CM | POA: Diagnosis not present

## 2023-11-14 DIAGNOSIS — H353211 Exudative age-related macular degeneration, right eye, with active choroidal neovascularization: Secondary | ICD-10-CM | POA: Diagnosis not present

## 2023-11-14 DIAGNOSIS — Z961 Presence of intraocular lens: Secondary | ICD-10-CM | POA: Diagnosis not present

## 2023-11-14 DIAGNOSIS — H43821 Vitreomacular adhesion, right eye: Secondary | ICD-10-CM | POA: Diagnosis not present

## 2023-11-21 ENCOUNTER — Ambulatory Visit: Payer: Medicare Other | Admitting: Urology

## 2023-11-21 VITALS — BP 106/71 | HR 111

## 2023-11-21 DIAGNOSIS — C61 Malignant neoplasm of prostate: Secondary | ICD-10-CM | POA: Diagnosis not present

## 2023-11-21 DIAGNOSIS — R972 Elevated prostate specific antigen [PSA]: Secondary | ICD-10-CM

## 2023-11-21 DIAGNOSIS — N401 Enlarged prostate with lower urinary tract symptoms: Secondary | ICD-10-CM

## 2023-11-21 DIAGNOSIS — N138 Other obstructive and reflux uropathy: Secondary | ICD-10-CM

## 2023-11-21 DIAGNOSIS — R351 Nocturia: Secondary | ICD-10-CM

## 2023-11-21 DIAGNOSIS — K4090 Unilateral inguinal hernia, without obstruction or gangrene, not specified as recurrent: Secondary | ICD-10-CM

## 2023-11-21 LAB — URINALYSIS, ROUTINE W REFLEX MICROSCOPIC
Bilirubin, UA: NEGATIVE
Glucose, UA: NEGATIVE
Leukocytes,UA: NEGATIVE
Nitrite, UA: NEGATIVE
Protein,UA: NEGATIVE
RBC, UA: NEGATIVE
Specific Gravity, UA: 1.02 (ref 1.005–1.030)
Urobilinogen, Ur: 0.2 mg/dL (ref 0.2–1.0)
pH, UA: 6 (ref 5.0–7.5)

## 2023-11-21 NOTE — Progress Notes (Unsigned)
 Marland Kitchen

## 2023-11-21 NOTE — Progress Notes (Unsigned)
 Subjective:  1. Elevated prostate specific antigen (PSA)   2. Prostate cancer (HCC)   3. BPH with urinary obstruction   4. Nocturia   5. Unilateral inguinal hernia without obstruction or gangrene, recurrence not specified      Jerry Macdonald is a 89 y.o.  male established patient who is here evaluation for treatment of prostate cancer.     3/20/25[name] returns today in f/u for his history below. He remains on AS for low volume GG2 prostate cancer. His PSA is 7.1 which is in his usual range on finasteride and tamsulosin.  He has stable LUTS with an IPSS of 11 and nocturia  x 3.   He has no associated signs or symptoms.  He had a CT in 1/25 and was noted to have a significant loop of ureter in the right inguinal hernia.  He has no obstruction.  No adenopathy or bone mets were noted.    05/16/23: Jerry Macdonald returns today in f/u for his history below.   His PSA is stable since March at 5.7.   He remains on tamsulosin and finasteride. He is voiding well with an IPSS of 11 and nocturia x 3.  He has no weight loss or bone pain.    2/29/24[name] returns today in f/u.  He remains on AS for low volume GG2 prostate cancer.  His PSA is up to 10.9 from 5.5 at his visit in 8/23.  He has BPH with BOO and remains on finasteride and tamsulosin. He had a CT on 09/10/22 for f/u of his colon cancer and the prostate was moderately enlarged.   No adenopathy was noted.  He has some daytime frequency q1-2x.  He has nocturia 2x.  He has a good stream.  He has had no bone pain or weight loss.   8/24/23[name] returns today in f/u.  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  His PSA is down to 5.5. from 5.8 at his last visit.  He remains on finasteride and tamsulosin.  His IPSS is 4 with nocturia x3.   A CT on 02/12/22 for colon cancer f/u showed no GU issues.   10/26/21: Jerry Macdonald returns today in f/u.  His PSA is 5.8 which is up from 5.2 in 11/22 and 4.9 8/22.   He remains on finasteride and  tamsulosin.  He has moderate LUTS with an IPSS of 17 with nocturia 2-3x.   He is content with the symptoms.   He has no weight loss.   He has some back pain but no bone pain.  A CT in November 2022 showed no findings to suggests a met.   His prostate cancer was diagnosed 10/04/2014. His PSA at his time of diagnosis was 6. His  PSA has been variable with and 4.4 in 5/22 and 4.9 on 04/06/21.  It was 3.5 in 7/21, 4.08 on 09/06/20 and 5.8 on 10/06/20 but he had a colon resection and a foley prior to that blood draw.  He had colon cancer with negative margins.  He had a thyroidectomy for goiter on 01/23/21.   He remains on finasteride and tamsulosin.  His IPSS is 12 with nocturia x 2.  His UA is clear.    03/11/20: Jerry Macdonald returns today in f/u. He is AS from low volume Gleason 7(3+4) disease found on surveillance biopsy in 8/17. He had a single core Gleason 6 on his initial biopsy in 2016. He has been seen by  Dr. Kathrynn Running and he felt continued AS was reasonable. He remains on finasteride and his PSA is back down to 3.5 from 3.8 at his last visit. He remains on tamsulosin. He is voiding well. He has nocturia x 2. He has a good stream without hesitancy. He has less urgency except in the morning. He does have a sensation of incomplete emptying and he can have a weak stream. His IPSS is 9. He has no associated signs or symptoms.   He was originally diagnosed in 2/16 with a single core of Gleason 6, 10% on the left mid lateral core. His PSA has risen very slowly and was 6.52 prior to a repeat biopsy in 8/17. The repeat biopsy showed a prostate and there were 3 cores positive with 5% Gleason 6 in the left lateral base, 10% Gleason 7(3+4) in the left lateral apex and 20% Gleason 6 in the right medial apex.    IPSS     Row Name 11/21/23 1300         International Prostate Symptom Score   How often have you had the sensation of not emptying your bladder? Less than 1 in 5     How often have you had to urinate less  than every two hours? About half the time     How often have you found you stopped and started again several times when you urinated? About half the time     How often have you found it difficult to postpone urination? Not at All     How often have you had a weak urinary stream? Less than 1 in 5 times     How often have you had to strain to start urination? Not at All     How many times did you typically get up at night to urinate? 3 Times     Total IPSS Score 11       Quality of Life due to urinary symptoms   If you were to spend the rest of your life with your urinary condition just the way it is now how would you feel about that? Mostly Satisfied                  ROS:  ROS:  A complete review of systems was performed.  All systems are negative except for pertinent findings as noted.   Review of Systems  Genitourinary:  Positive for frequency.  All other systems reviewed and are negative.   Allergies  Allergen Reactions   Penicillins     Swelling of hands and feet - severe Did it involve swelling of the face/tongue/throat, SOB, or low BP? No Did it involve sudden or severe rash/hives, skin peeling, or any reaction on the inside of your mouth or nose? No Did you need to seek medical attention at a hospital or doctor's office? No When did it last happen?      1950s If all above answers are "NO", may proceed with cephalosporin use.    Penicillin G Other (See Comments)    Outpatient Encounter Medications as of 11/21/2023  Medication Sig   acetaminophen (TYLENOL) 500 MG tablet Take 500 mg by mouth every 8 (eight) hours as needed for moderate pain.   calcium-vitamin D (OSCAL WITH D) 500-5 MG-MCG tablet Take 1 tablet by mouth in the morning and at bedtime. Lunch and dinner   Cholecalciferol (VITAMIN D3) 25 MCG (1000 UT) CAPS Take 1 capsule by mouth 3 (three) times daily.  ferrous sulfate 324 MG TBEC Take 324 mg by mouth every other day.   finasteride (PROSCAR) 5 MG tablet  Take 1 tablet (5 mg total) by mouth daily.   levothyroxine (SYNTHROID) 100 MCG tablet TAKE 1 TABLET DAILY BEFORE BREAKFAST   magnesium oxide (MAG-OX) 400 MG tablet Take 400 mg by mouth 2 (two) times daily.   Multiple Vitamins-Minerals (MULTIVITAMINS THER. W/MINERALS) TABS Take 1 tablet by mouth daily.   ondansetron (ZOFRAN-ODT) 4 MG disintegrating tablet Take 1 tablet (4 mg total) by mouth every 6 (six) hours as needed for nausea.   pantoprazole (PROTONIX) 40 MG tablet TAKE 1 TABLET ONCE A DAY BEFORE A MEAL, MAY TAKE SECOND TABLET BEFORE EVENING MEAL IF NEEDED   tamsulosin (FLOMAX) 0.4 MG CAPS capsule Take 1 capsule (0.4 mg total) by mouth daily.   traMADol (ULTRAM) 50 MG tablet Take 1 tablet (50 mg total) by mouth every 6 (six) hours as needed for moderate pain or severe pain.   No facility-administered encounter medications on file as of 11/21/2023.    Past Medical History:  Diagnosis Date   Abnormal prostate biopsy    Back pain    with radiculopathy   Cataracts, bilateral    Constipation    Family history of lung cancer    Family history of ovarian cancer    Family history of prostate cancer    Goiter    HOH (hard of hearing)    Hyperlipidemia    Hypertrophy of prostate    Malaise and fatigue    Obesity    Pyogenic granuloma    left cheeck   S/P colonoscopy June 2011   rectal bleeding secondary to hemorrhoids   S/P colonoscopy 2007   [facility], diverticulosis per Dr. Scharlene Gloss note   S/P endoscopy June 2011   Barrett's, mild gastritis and duodenitis   Seborrheic keratosis    Sleep apnea     Past Surgical History:  Procedure Laterality Date   BIOPSY  03/27/2019   Procedure: BIOPSY;  Surgeon: West Bali, MD;  [facility];  Service: Endoscopy;;   BIOPSY  08/16/2020   Procedure: BIOPSY;  Surgeon: Lanelle Bal, DO;  [facility]: [facility];  Service: Endoscopy;;   COLONOSCOPY  JUN 2011   PAN-COLONIC DIVERTICULOSIS   COLONOSCOPY  2007   [facility]    COLONOSCOPY WITH PROPOFOL N/A 08/16/2020   Procedure: COLONOSCOPY WITH PROPOFOL;  Surgeon: Lanelle Bal, DO;  [facility]: [facility];  Service: Endoscopy;  Laterality: N/A;  10:45am   ESOPHAGOGASTRODUODENOSCOPY  06/11/2011   Procedure: ESOPHAGOGASTRODUODENOSCOPY (EGD);  Surgeon: Arlyce Harman, MD;  [facility]: [facility];  Service: Endoscopy;  Laterality: N/A;  11:00   ESOPHAGOGASTRODUODENOSCOPY N/A 05/21/2014   Dr. Fields:Barrett's esophagus/ Medium sized hiatal hernia. path with indefinite dysplasia.    ESOPHAGOGASTRODUODENOSCOPY N/A 12/20/2014   Dr. Jennell Corner gastric polyp/large HH/6 cm segment of Barrett's esophagus, path with hyperplastic gastric polyp, no dysplasia. Surveillance April 2017 due    ESOPHAGOGASTRODUODENOSCOPY N/A 01/20/2016   Procedure: ESOPHAGOGASTRODUODENOSCOPY (EGD);  Surgeon: West Bali, MD;  [facility];  Service: Endoscopy;  Laterality: N/A;  1230pm   ESOPHAGOGASTRODUODENOSCOPY N/A 03/27/2019   Procedure: ESOPHAGOGASTRODUODENOSCOPY (EGD);  Surgeon: West Bali, MD;  [facility];  Service: Endoscopy;  Laterality: N/A;  12:15pm   HERNIA REPAIR     LAPAROSCOPIC PARTIAL COLECTOMY Right 09/28/2020   Procedure: LAPAROSCOPIC RIGHT HEMI COLECTOMY;  Surgeon: Lucretia Roers, MD;  Location: [facility];  Service:  General;  Laterality: Right;   THYROIDECTOMY N/A 01/23/2021   Procedure: TOTAL THYROIDECTOMY;  Surgeon: Lucretia Roers, MD;  Location: [facility];  Service: General;  Laterality: N/A;   UMBILICAL HERNIA REPAIR     VARICOSE VEIN SURGERY     VENTRAL HERNIA REPAIR N/A 09/20/2022   Procedure: HERNIA REPAIR VENTRAL ADULT W/ MESH;  Surgeon: Lucretia Roers, MD;  Location: [facility];  Service: General;  Laterality: N/A;    Social History   Socioeconomic History   Marital status: Divorced    Spouse name: Not on file   Number of children: Not on file   Years of education: Not on file   Highest education level: Not on file  Occupational  History   Not on file  Tobacco Use   Smoking status: Former    Current packs/day: 0.00    Average packs/day: 0.5 packs/day for 2.0 years (1.0 ttl pk-yrs)    Types: Cigarettes    Start date: 09/09/1961    Quit date: 09/10/1963    Years since quitting: 60.2   Smokeless tobacco: Never   Tobacco comments:    quit in 1965  Vaping Use   Vaping status: Former  Substance and Sexual Activity   Alcohol use: Not Currently   Drug use: No   Sexual activity: Not Currently  Other Topics Concern   Not on file  Social History Narrative   Not on file   Social Drivers of Health   Financial Resource Strain: Not on file  Food Insecurity: Not on file  Transportation Needs: No Transportation Needs (09/06/2020)   PRAPARE - Administrator, Civil Service (Medical): No    Lack of Transportation (Non-Medical): No  Physical Activity: Inactive (09/06/2020)   Exercise Vital Sign    Days of Exercise per Week: 0 days    Minutes of Exercise per Session: 0 min  Stress: Not on file  Social Connections: Not on file  Intimate Partner Violence: Not At Risk (09/06/2020)   Humiliation, Afraid, Rape, and Kick questionnaire    Fear of Current or Ex-Partner: No    Emotionally Abused: No    Physically Abused: No    Sexually Abused: No    Family History  Problem Relation Age of Onset   Prostate cancer Father 15       metastatic   Lung cancer Brother 41       heavy smoker   Prostate cancer Paternal Uncle        metastatic   Alzheimer's disease Maternal Grandmother    Prostate cancer Maternal Grandfather 82       metastatic   Prostate cancer Paternal Grandfather 56       metastatic   Ovarian cancer Paternal Aunt 50   Prostate cancer Paternal Uncle        metastatic   Cancer Cousin 59       unknown type, paternal first cousin   Liver cancer Cousin        dx 30s, paternal first cousin   Colon polyps Neg Hx    Colon cancer Neg Hx        Objective: Vitals:   11/21/23 1341  BP: 106/71   Pulse: (!) 111      Physical Exam Vitals reviewed.  Constitutional:      Appearance: Normal appearance.  Neurological:     Mental Status: He is alert.     Lab Results:  Results for orders placed or performed in visit on 11/21/23 (from  the past 24 hours)  Urinalysis, Routine w reflex microscopic     Status: Abnormal   Collection Time: 11/21/23  1:52 PM  Result Value Ref Range   Specific Gravity, UA 1.020 1.005 - 1.030   pH, UA 6.0 5.0 - 7.5   Color, UA Yellow Yellow   Appearance Ur Clear Clear   Leukocytes,UA Negative Negative   Protein,UA Negative Negative/Trace   Glucose, UA Negative Negative   Ketones, UA Trace (A) Negative   RBC, UA Negative Negative   Bilirubin, UA Negative Negative   Urobilinogen, Ur 0.2 0.2 - 1.0 mg/dL   Nitrite, UA Negative Negative   Microscopic Examination Comment    Narrative   Performed at:  [address] [facility] [address] [place], [place]  [id number] Lab Director: Chinita Pester MT, Phone:  [phone]        BMET No results for input(s): "NA", "K", "CL", "CO2", "GLUCOSE", "BUN", "CREATININE", "CALCIUM" in the last 72 hours. PSA Lab Results  Component Value Date   PSA1 7.1 (H) 11/11/2023   PSA1 5.7 (H) 05/09/2023   PSA1 5.6 (H) 11/12/2022     Lab Results  Component Value Date   PSA 3.5 03/04/2020   PSA Dr. Rito Ehrlich of Urology follows 08/12/2008    UA is clear   Studies/Results: Narrative & Impression     CT ABDOMEN PELVIS W CONTRAST Result Date: 10/06/2023 CLINICAL DATA:  History of cecal adenocarcinoma, monitor. * Tracking Code: BO * EXAM: CT ABDOMEN AND PELVIS WITH CONTRAST TECHNIQUE: Multidetector CT imaging of the abdomen and pelvis was performed using the standard protocol following bolus administration of intravenous contrast. RADIATION DOSE REDUCTION: This exam was performed according to the departmental dose-optimization program which includes automated exposure control, adjustment of the mA and/or kV  according to patient size and/or use of iterative reconstruction technique. CONTRAST:  OMNIPAQUE IOHEXOL 300 MG/ML  SOLN COMPARISON:  Multiple priors including most recent CT September 10, 2022 FINDINGS: Lower chest: Calcified pleural plaques compatible with prior asbestos exposure. Hepatobiliary: Stable 19 mm cyst in the left lobe of the liver. No new suspicious hepatic lesion. Cholelithiasis without findings of acute cholecystitis. No biliary ductal dilation. Pancreas: No pancreatic ductal dilation or evidence of acute inflammation. Spleen: No splenomegaly. Adrenals/Urinary Tract: Bilateral adrenal glands appear normal. Pelvic ectopic right kidney. No left hydronephrosis. Prominence of the left ureter a portion of which is contained within a right inguinal hernia. Kidneys demonstrate symmetric enhancement. Urinary bladder is unremarkable for degree of distension. Stomach/Bowel: Radiopaque enteric contrast material reaches the splenic flexure. Moderate-sized hiatal hernia. No pathologic dilation of small or large bowel. Colonic diverticulosis without findings of acute diverticulitis. Prior right hemicolectomy without evidence of local recurrence. Vascular/Lymphatic: Aortic atherosclerosis. Normal caliber abdominal aorta. Decompressed IVC. No pathologically enlarged abdominal or pelvic lymph nodes. Reproductive: Enlarged prostate gland. Other: Large right inguinal hernia contains fat and dilated portion of right ureter. Fat containing left inguinal hernia. Musculoskeletal: Multilevel degenerative change of the spine with multifocal Schmorl's node formation. No aggressive lytic or blastic lesion of bone. Diffuse demineralization of bone. IMPRESSION: 1. Prior right hemicolectomy without evidence of local recurrence or metastatic disease in the abdomen or pelvis. 2. Large right inguinal hernia contains fat and dilated portion of right ureter. 3. Cholelithiasis without findings of acute cholecystitis. 4. Colonic  diverticulosis without findings of acute diverticulitis. 5. Moderate-sized hiatal hernia. 6.  Aortic Atherosclerosis (ICD10-I70.0). Electronically Signed   By: Maudry Mayhew M.D.   On: 10/06/2023 08:55  Assessment & Plan: Prostate cancer on surveillance.   His PSA is up slightly but below the prior high.  I will have him return in 6 months with a PSA. Marland Kitchen     Nodular prostate with BOO.  He is voiding well on tamsulosin and finasteride.    RIH with right ureteral involvement.   I have notified Dr. Henreitta Leber of the finding in case he needs a hernia repair.   No orders of the defined types were placed in this encounter.     Orders Placed This Encounter  Procedures   Urinalysis, Routine w reflex microscopic   PSA, total and free    Standing Status:   Future    Expected Date:   05/23/2024    Expiration Date:   11/20/2024      Return in about 6 months (around 05/23/2024) for with any available provider with a PSA.  .   CC: Benita Stabile, MD      Bjorn Pippin 11/22/2023 Patient ID: Junious Dresser, male   DOB: [birth year], 89 y.o.   MRN: [id number] Patient ID: THAO VANOVER, male   DOB: Sep 25, 1930, 89 y.o.   MRN: [id number]

## 2023-11-22 ENCOUNTER — Encounter: Payer: Self-pay | Admitting: Urology

## 2023-12-02 DIAGNOSIS — G8929 Other chronic pain: Secondary | ICD-10-CM | POA: Diagnosis not present

## 2023-12-02 DIAGNOSIS — M545 Low back pain, unspecified: Secondary | ICD-10-CM | POA: Diagnosis not present

## 2023-12-02 DIAGNOSIS — E890 Postprocedural hypothyroidism: Secondary | ICD-10-CM | POA: Diagnosis not present

## 2023-12-03 LAB — PTH, INTACT AND CALCIUM
Calcium: 9 mg/dL (ref 8.6–10.2)
PTH: 28 pg/mL (ref 15–65)

## 2023-12-03 LAB — TSH: TSH: 0.787 u[IU]/mL (ref 0.450–4.500)

## 2023-12-03 LAB — T4, FREE: Free T4: 1.23 ng/dL (ref 0.82–1.77)

## 2023-12-16 ENCOUNTER — Ambulatory Visit: Payer: Medicare Other | Admitting: "Endocrinology

## 2023-12-26 DIAGNOSIS — H353211 Exudative age-related macular degeneration, right eye, with active choroidal neovascularization: Secondary | ICD-10-CM | POA: Diagnosis not present

## 2023-12-26 DIAGNOSIS — H43823 Vitreomacular adhesion, bilateral: Secondary | ICD-10-CM | POA: Diagnosis not present

## 2023-12-26 DIAGNOSIS — H353132 Nonexudative age-related macular degeneration, bilateral, intermediate dry stage: Secondary | ICD-10-CM | POA: Diagnosis not present

## 2024-01-08 ENCOUNTER — Telehealth: Payer: Self-pay | Admitting: *Deleted

## 2024-01-08 NOTE — Telephone Encounter (Signed)
 Received call from patient friend, Haskell Linker [phone]~ telephone.  Reports that patient has bulge above navel at prior incision site after hemicolectomy.   Denies pain, changes in bowel habits, or skin discoloration to bulging area.   Appointment scheduled for evaluation.

## 2024-01-13 ENCOUNTER — Ambulatory Visit (INDEPENDENT_AMBULATORY_CARE_PROVIDER_SITE_OTHER): Admitting: "Endocrinology

## 2024-01-13 ENCOUNTER — Encounter: Payer: Self-pay | Admitting: "Endocrinology

## 2024-01-13 VITALS — BP 108/62 | HR 72 | Ht 70.5 in | Wt 199.0 lb

## 2024-01-13 DIAGNOSIS — E890 Postprocedural hypothyroidism: Secondary | ICD-10-CM

## 2024-01-13 MED ORDER — LEVOTHYROXINE SODIUM 100 MCG PO TABS: 100.0000 ug | ORAL_TABLET | Freq: Every day | ORAL | 1 refills | Status: DC

## 2024-01-13 NOTE — Progress Notes (Signed)
 01/13/2024, 8:55 AM         Endocrinology follow-up note   Subjective:    Patient ID: Jerry Macdonald, male    DOB: Jun 28, 1931, PCP Omie Bickers, MD   Past Medical History:  Diagnosis Date   Abnormal prostate biopsy    Back pain    with radiculopathy   Cataracts, bilateral    Constipation    Family history of lung cancer    Family history of ovarian cancer    Family history of prostate cancer    Goiter    HOH (hard of hearing)    Hyperlipidemia    Hypertrophy of prostate    Malaise and fatigue    Obesity    Pyogenic granuloma    left cheeck   S/P colonoscopy June 2011   rectal bleeding secondary to hemorrhoids   S/P colonoscopy 2007   [facility], diverticulosis per Dr. Quentin Brunner note   S/P endoscopy June 2011   Barrett's, mild gastritis and duodenitis   Seborrheic keratosis    Sleep apnea    Past Surgical History:  Procedure Laterality Date   BIOPSY  03/27/2019   Procedure: BIOPSY;  Surgeon: Alyce Jubilee, MD;  [facility];  Service: Endoscopy;;   BIOPSY  08/16/2020   Procedure: BIOPSY;  Surgeon: Vinetta Greening, DO;  [facility];  Service: Endoscopy;;   COLONOSCOPY  JUN 2011   PAN-COLONIC DIVERTICULOSIS   COLONOSCOPY  2007   [facility]   COLONOSCOPY WITH PROPOFOL  N/A 08/16/2020   Procedure: COLONOSCOPY WITH PROPOFOL ;  Surgeon: Vinetta Greening, DO;  [facility];  Service: Endoscopy;  Laterality: N/A;  10:45am   ESOPHAGOGASTRODUODENOSCOPY  06/11/2011   Procedure: ESOPHAGOGASTRODUODENOSCOPY (EGD);  Surgeon: Pauleen Borne, MD;  [facility];  Service: Endoscopy;  Laterality: N/A;  11:00   ESOPHAGOGASTRODUODENOSCOPY N/A 05/21/2014   Dr. Fields:Barrett's esophagus/ Medium sized hiatal hernia. path with indefinite dysplasia.    ESOPHAGOGASTRODUODENOSCOPY N/A 12/20/2014   Dr. Lugene Sahara gastric polyp/large HH/6 cm segment of Barrett's esophagus, path with hyperplastic  gastric polyp, no dysplasia. Surveillance April 2017 due    ESOPHAGOGASTRODUODENOSCOPY N/A 01/20/2016   Procedure: ESOPHAGOGASTRODUODENOSCOPY (EGD);  Surgeon: Alyce Jubilee, MD;  [facility];  Service: Endoscopy;  Laterality: N/A;  1230pm   ESOPHAGOGASTRODUODENOSCOPY N/A 03/27/2019   Procedure: ESOPHAGOGASTRODUODENOSCOPY (EGD);  Surgeon: Alyce Jubilee, MD;  [facility];  Service: Endoscopy;  Laterality: N/A;  12:15pm   HERNIA REPAIR     LAPAROSCOPIC PARTIAL COLECTOMY Right 09/28/2020   Procedure: LAPAROSCOPIC RIGHT HEMI COLECTOMY;  Surgeon: Awilda Bogus, MD;  Location: [facility];  Service: General;  Laterality: Right;   THYROIDECTOMY N/A 01/23/2021   Procedure: TOTAL THYROIDECTOMY;  Surgeon: Awilda Bogus, MD;  Location: [facility];  Service: General;  Laterality: N/A;   UMBILICAL HERNIA REPAIR     VARICOSE VEIN SURGERY     VENTRAL HERNIA REPAIR N/A 09/20/2022   Procedure: HERNIA REPAIR VENTRAL ADULT W/ MESH;  Surgeon: Awilda Bogus, MD;  Location: [facility];  Service: General;  Laterality: N/A;   Social History   Socioeconomic History   Marital status: Divorced    Spouse name: Not on  file   Number of children: Not on file   Years of education: Not on file   Highest education level: Not on file  Occupational History   Not on file  Tobacco Use   Smoking status: Former    Current packs/day: 0.00    Average packs/day: 0.5 packs/day for 2.0 years (1.0 ttl pk-yrs)    Types: Cigarettes    Start date: 09/09/1961    Quit date: 09/10/1963    Years since quitting: 60.3   Smokeless tobacco: Never   Tobacco comments:    quit in 1965  Vaping Use   Vaping status: Former  Substance and Sexual Activity   Alcohol use: Not Currently   Drug use: No   Sexual activity: Not Currently  Other Topics Concern   Not on file  Social History Narrative   Not on file   Social Drivers of Health   Financial Resource Strain: Not on file  Food Insecurity: Not on file   Transportation Needs: No Transportation Needs (09/06/2020)   PRAPARE - Administrator, Civil Service (Medical): No    Lack of Transportation (Non-Medical): No  Physical Activity: Inactive (09/06/2020)   Exercise Vital Sign    Days of Exercise per Week: 0 days    Minutes of Exercise per Session: 0 min  Stress: Not on file  Social Connections: Not on file   Family History  Problem Relation Age of Onset   Prostate cancer Father 74       metastatic   Lung cancer Brother 22       heavy smoker   Prostate cancer Paternal Uncle        metastatic   Alzheimer's disease Maternal Grandmother    Prostate cancer Maternal Grandfather 82       metastatic   Prostate cancer Paternal Grandfather 63       metastatic   Ovarian cancer Paternal Aunt 9   Prostate cancer Paternal Uncle        metastatic   Cancer Cousin 40       unknown type, paternal first cousin   Liver cancer Cousin        dx 30s, paternal first cousin   Colon polyps Neg Hx    Colon cancer Neg Hx    Outpatient Encounter Medications as of 01/13/2024  Medication Sig   acetaminophen  (TYLENOL ) 500 MG tablet Take 500 mg by mouth every 8 (eight) hours as needed for moderate pain.   calcium -vitamin D  (OSCAL WITH D) 500-5 MG-MCG tablet Take 1 tablet by mouth in the morning and at bedtime. Lunch and dinner   Cholecalciferol  (VITAMIN D3) 25 MCG (1000 UT) CAPS Take 1 capsule by mouth 3 (three) times daily.   ferrous sulfate 324 MG TBEC Take 324 mg by mouth every other day.   finasteride  (PROSCAR ) 5 MG tablet Take 1 tablet (5 mg total) by mouth daily.   levothyroxine  (SYNTHROID ) 100 MCG tablet Take 1 tablet (100 mcg total) by mouth daily before breakfast.   magnesium  oxide (MAG-OX) 400 MG tablet Take 400 mg by mouth 2 (two) times daily.   Multiple Vitamins-Minerals (MULTIVITAMINS THER. W/MINERALS) TABS Take 1 tablet by mouth daily.   ondansetron  (ZOFRAN -ODT) 4 MG disintegrating tablet Take 1 tablet (4 mg total) by mouth every 6  (six) hours as needed for nausea.   pantoprazole  (PROTONIX ) 40 MG tablet TAKE 1 TABLET ONCE A DAY BEFORE A MEAL, MAY TAKE SECOND TABLET BEFORE EVENING MEAL IF NEEDED   tamsulosin  (FLOMAX )  0.4 MG CAPS capsule Take 1 capsule (0.4 mg total) by mouth daily.   traMADol  (ULTRAM ) 50 MG tablet Take 1 tablet (50 mg total) by mouth every 6 (six) hours as needed for moderate pain or severe pain.   [DISCONTINUED] levothyroxine  (SYNTHROID ) 100 MCG tablet TAKE 1 TABLET DAILY BEFORE BREAKFAST   No facility-administered encounter medications on file as of 01/13/2024.   ALLERGIES: Allergies  Allergen Reactions   Penicillins     Swelling of hands and feet - severe Did it involve swelling of the face/tongue/throat, SOB, or low BP? No Did it involve sudden or severe rash/hives, skin peeling, or any reaction on the inside of your mouth or nose? No Did you need to seek medical attention at a hospital or doctor's office? No When did it last happen?      1950s If all above answers are "NO", may proceed with cephalosporin use.    Penicillin G Other (See Comments)    VACCINATION STATUS: Immunization History  Administered Date(s) Administered   DTaP 06/03/2013   H1N1 08/12/2008   Influenza Whole 07/07/2007, 05/20/2008   Influenza-Unspecified 06/03/2013, 06/03/2018   Pneumococcal-Unspecified 06/03/2013, 06/03/2014   Varicella 06/03/2013    HPI Jerry Macdonald is 89 y.o. male who is seen in follow-up for history of compressive multinodular goiter.    He underwent total thyroidectomy on Jan 23, 2021 by Dr. Hillman Luck.   His surgical sample is negative for malignancy.    - He reports feeling better, previous compressive symptoms have resolved.  He is currently on levothyroxine  100 mcg p.o. daily before breakfast.    He continues to tolerate this medication, reports reasonable consistency and compliance.     His previsit labs are consistent with appropriate replacement.     He is cared for by Myra Aschoff his friend and power of attorney , could not make it to clinic today.    For his postsurgical hypocalcemia, he remains on low-dose calcium /vitamin D  supplements.    He is not an optimal historian.  History is obtained mainly from chart review. he has been dealing with symptoms of fatigue, positional shortness of breath, sleep apnea for several years. He reports that he breathes better, swallows without difficulty. Aaron Aas  He is a former smoker, denies any exposure to neck radiations. His other medical problems include BPH/prostate cancer, Barrett's esophagus, sleep apnea, and recently diagnosed cecal/colon cancer. He is cared for by an elderly friend Danley Dusky is also his power of attorney.   Review of Systems  Limited as above.  Objective:       01/13/2024    8:30 AM 11/21/2023    1:41 PM 10/07/2023   11:14 AM  Vitals with BMI  Height 5' 10.5"    Weight 199 lbs  202 lbs 13 oz  BMI 28.14    Systolic 108 106 478  Diastolic 62 71 80  Pulse 72 111 99    BP 108/62   Pulse 72   Ht 5' 10.5" (1.791 m)   Wt 199 lb (90.3 kg)   BMI 28.15 kg/m   Wt Readings from Last 3 Encounters:  01/13/24 199 lb (90.3 kg)  10/07/23 202 lb 13.2 oz (92 kg)  06/17/23 211 lb (95.7 kg)    Physical Exam    CMP ( most recent) CMP     Component Value Date/Time   NA 136 09/30/2023 0859   NA 143 06/04/2022 1122   K 4.2 09/30/2023 0859   CL 101 09/30/2023 0859  CO2 29 09/30/2023 0859   GLUCOSE 104 (H) 09/30/2023 0859   BUN 17 09/30/2023 0859   BUN 17 06/04/2022 1122   CREATININE 0.81 09/30/2023 0859   CALCIUM  9.0 12/02/2023 1218   CALCIUM  8.6 01/23/2021 1552   PROT 7.2 09/30/2023 0859   PROT 6.6 06/04/2022 1122   ALBUMIN 4.0 09/30/2023 0859   ALBUMIN 4.1 06/04/2022 1122   AST 25 09/30/2023 0859   ALT 18 09/30/2023 0859   ALKPHOS 59 09/30/2023 0859   BILITOT 1.9 (H) 09/30/2023 0859   BILITOT 1.3 (H) 06/04/2022 1122   GFRNONAA >60 09/30/2023 0859   GFRAA  02/03/2010 1249     >60        The eGFR has been calculated using the MDRD equation. This calculation has not been validated in all clinical situations. eGFR's persistently <60 mL/min signify possible Chronic Kidney Disease.     Lipid Panel ( most recent) Lipid Panel     Component Value Date/Time   CHOL 139 08/12/2008 2244   TRIG 88 08/12/2008 2244   HDL 39 (L) 08/12/2008 2244   CHOLHDL 3.6 Ratio 08/12/2008 2244   VLDL 18 08/12/2008 2244   LDLCALC 82 08/12/2008 2244      Lab Results  Component Value Date   TSH 0.787 12/02/2023   TSH 0.582 06/10/2023   TSH 0.653 06/04/2022   TSH 0.778 12/04/2021   TSH 7.190 (H) 07/28/2021   TSH 0.727 02/02/2021   TSH 0.103 (L) 01/19/2021   TSH 0.198 (L) 10/21/2020   TSH 0.075 (L) 09/06/2020   TSH 0.772 08/09/2007   FREET4 1.23 12/02/2023   FREET4 1.33 06/10/2023   FREET4 1.31 06/04/2022   FREET4 1.35 12/04/2021   FREET4 1.12 07/28/2021   FREET4 1.02 02/02/2021   FREET4 1.06 10/21/2020    September 14, 2020 thyroid  ultrasound: Right lobe measures 13.8 x 6.7 x 6.4 cm with large 5.3 cm nodule which was biopsied and findings were benign. Left lobe measured 9.9 x 4.9 x 3.5 cm with 2 nodules measuring 4.8 cm Pseudonodule not biopsied and 1.5 cm-suspicious biopsied.  Fine-needle aspiration biopsy results of only thyroid  nodule is available today showing benign follicular nodule.  Total thyroidectomy Jan 23, 2021. Surgical sample was significant for nodular hyperplasia bilaterally.  Assessment & Plan:   1.  Postsurgical hypothyroidism 2. Multinodular goiter 3.  Hypocalcemia  His compressive symptoms have resolved.  His previsit thyroid  function tests are consistent with appropriate replacement.  He is advised to continue levothyroxine  100 mcg p.o. daily before breakfast.     - We discussed about the correct intake of his thyroid  hormone, on empty stomach at fasting, with water , separated by at least 30 minutes from breakfast and other medications,  and  separated by more than 4 hours from calcium , iron, multivitamins, acid reflux medications (PPIs). -Patient is made aware of the fact that thyroid  hormone replacement is needed for life, dose to be adjusted by periodic monitoring of thyroid  function tests.   For his postsurgical mild hypocalcemia, he is advised to continue calcium /vitamin D   D 500- 200 1 tablet 3 times daily with lunch and supper.     - he is advised to maintain close follow up with Omie Bickers, MD for primary care needs.    I spent  21  minutes in the care of the patient today including review of labs from Thyroid  Function, CMP, and other relevant labs ; imaging/biopsy records (current and previous including abstractions from other facilities); face-to-face time discussing  his lab results and symptoms, medications doses, his options of short and long term treatment based on the latest standards of care / guidelines;   and documenting the encounter.  Sanad C Schelling  participated in the discussions, expressed understanding, and voiced agreement with the above plans.  All questions were answered to his satisfaction. he is encouraged to contact clinic should he have any questions or concerns prior to his return visit.   Follow up plan: Return in about 6 months (around 07/15/2024) for F/U with Pre-visit Labs.   Kalvin Orf, MD [facility] [address] [place], [place] [address] Phone: [phone]  Fax: [phone]     01/13/2024, 8:55 AM  This note was partially dictated with voice recognition software. Similar sounding words can be transcribed inadequately or may not  be corrected upon review.

## 2024-02-06 DIAGNOSIS — H353211 Exudative age-related macular degeneration, right eye, with active choroidal neovascularization: Secondary | ICD-10-CM | POA: Diagnosis not present

## 2024-02-06 DIAGNOSIS — H43822 Vitreomacular adhesion, left eye: Secondary | ICD-10-CM | POA: Diagnosis not present

## 2024-02-06 DIAGNOSIS — H43821 Vitreomacular adhesion, right eye: Secondary | ICD-10-CM | POA: Diagnosis not present

## 2024-02-06 DIAGNOSIS — H353132 Nonexudative age-related macular degeneration, bilateral, intermediate dry stage: Secondary | ICD-10-CM | POA: Diagnosis not present

## 2024-02-20 ENCOUNTER — Encounter: Payer: Self-pay | Admitting: General Surgery

## 2024-02-20 ENCOUNTER — Ambulatory Visit (INDEPENDENT_AMBULATORY_CARE_PROVIDER_SITE_OTHER): Admitting: General Surgery

## 2024-02-20 VITALS — BP 104/72 | HR 81 | Temp 97.6°F | Resp 14 | Ht 70.5 in | Wt 194.0 lb

## 2024-02-20 DIAGNOSIS — K432 Incisional hernia without obstruction or gangrene: Secondary | ICD-10-CM

## 2024-02-20 NOTE — Progress Notes (Signed)
 [facility] Note   HPI:  89 y.o. Male presents to clinic for follow-up evaluation of his abdominal wall. He has noticed another hernia in the epigastric area.   Review of Systems:  No tenderness No pain Enlarging hernia  All other review of systems: otherwise negative   Vital Signs:  BP 104/72   Pulse 81   Temp 97.6 F (36.4 C) (Oral)   Resp 14   Ht 5' 10.5 (1.791 m)   Wt 194 lb (88 kg)   SpO2 96%   BMI 27.44 kg/m    Physical Exam:  Physical Exam Vitals reviewed.   Cardiovascular:     Rate and Rhythm: Normal rate.  Pulmonary:     Effort: Pulmonary effort is normal.  Abdominal:     General: There is no distension.     Palpations: Abdomen is soft.     Tenderness: There is no abdominal tenderness.     Hernia: A hernia is present.     Comments: Epigastric hernia, 1cm reducible with fat    Musculoskeletal:     Cervical back: Normal range of motion.   Neurological:     Mental Status: He is alert.      Imaging:  CT 09/2023- I did not see the hernia on this CT   Assessment:  89 y.o. yo Male with a small epigastric site hernia. This is small and nontender. I do not think he has to rush to get this fixed. He can monitor it for now. It has fat in it and is reducible. Discussed reasons to call us  like worsening pain, enlarging.  Plan:  Will see in a few months and see if any worse.   Future Appointments  Date Time Provider Department Center  04/06/2024 10:00 AM [facility] None  04/13/2024 10:30 AM Eduardo Grade, MD [facility] None  05/18/2024  2:00 PM [facility] None  05/29/2024 12:00 PM McKenzie, Arden Beck, MD [facility] None  07/20/2024 11:00 AM Baby Bolt, MD [facility] None  08/13/2024  1:00 PM Awilda Bogus, MD [facility] None     Deena Farrier, MD [facility] [address] [place], [place] [address] [phone] (office)

## 2024-02-20 NOTE — Patient Instructions (Signed)
 Belly Hernia (Ventral Hernia): What to Know  A ventral hernia is a bulge of tissue from inside the belly that pushes through a weak area of the belly. Sometimes, the bulge may have tissue from the small intestine or the large intestine. Ventral hernias do not go away without surgery. There are several types of ventral hernias. You may have: A hernia at a place where surgery was done (incisional hernia). A hernia just above the belly button (epigastric or paraumbilical hernia) or at the belly button (umbilical hernia). These can happen because of heavy lifting or straining. A hernia that comes and goes (reducible hernia). It may be visible only when you lift or strain. This type of hernia can be pushed back into the belly. A hernia that traps belly tissue inside the hernia (incarcerated hernia). This hernia cannot be pushed back into the belly. A hernia that cuts off blood flow to the tissues inside the hernia (strangulation hernia). The tissues can start to die if this happens. This is a very painful bulge that cannot be pushed back into the belly. This type of hernia is a medical emergency. What are the causes? This condition happens when tissue in the belly pushes on a weak area in the muscles. What increases the risk? You're more likely to have this condition if: You are 60 years or older. You had belly surgery in the past. This is common if there was an infection after surgery. You had an injury to the belly. You often lift or push heavy objects. You have been pregnant several times. You have long-term (chronic) health conditions that put pressure in your belly. These include: Being overweight or obese. Having a buildup of fluid inside your belly (ascites). You throw up or cough over and over again. You have trouble pooping (constipation). You strain to poop or pee. What are the signs or symptoms? The only symptom of a ventral hernia may be a painless bulge in the belly.  Reducible  hernia may be visible only when you strain, cough, or lift. Other symptoms may include: Dull pain. A feeling of pressure. Symptoms of an incarcerated hernia may include: Tenderness at hernia site. Bloating. Throwing up or feeling like you may throw up. Trouble pooping or no pooping at all. Symptoms of a strangulated hernia may include: More pain. Throwing up or feeling like you may throw up. Pain when pressing on the hernia. The skin over the hernia turning red or purple. Trouble pooping. Blood in the poop. How is this diagnosed? This condition may be diagnosed based on: Your symptoms and medical history. A physical exam. You may be asked to cough or strain while standing. These actions increase the pressure inside your belly and force the hernia through the opening in your belly. Your health care provider may try to reduce the hernia by gently pushing the hernia back in. Imaging studies, such as an ultrasound or CT scan. How is this treated? This condition is treated with surgery. If you have a strangulated hernia, surgery is done as soon as possible. If your hernia is small and not incarcerated, you may be asked to lose some weight before surgery. Follow these instructions at home: Eat and drink only as you've been told. Lose weight, if told by your provider. You may have to avoid lifting. Ask your provider how much you can safely lift. Avoid activities that increase pressure on your hernia. Take your medicines only as told. You may need to take steps to help treat  or prevent trouble pooping (constipation), such as: Taking medicines to help you poop. Eating foods high in fiber, like beans, whole grains, and fresh fruits and vegetables. Drinking more fluids as told. Ask your provider if it's safe to drive or use machines while taking your medicine. Contact a health care provider if: Your hernia gets larger or feels hard. Your hernia becomes painful. Get help right away if: Your  hernia becomes very painful. You have pain along with any of these: Changes in skin color in the area of the hernia. Feeling like throwing up. Throwing up. Fever. These symptoms may be an emergency. Call 911 right away. Do not wait to see if the symptoms will go away. Do not drive yourself to the hospital. This information is not intended to replace advice given to you by your health care provider. Make sure you discuss any questions you have with your health care provider. Document Revised: 02/26/2023 Document Reviewed: 02/26/2023 Elsevier Patient Education  2024 [employer].

## 2024-02-20 NOTE — Progress Notes (Deleted)
 [facility] Note   HPI:  89 y.o. Male presents to clinic for ***post-op follow-up evaluation of ***. Patient reports ***, denies ***.  Review of Systems:  *** All other review of systems: otherwise negative   Vital Signs:  BP 104/72   Pulse 81   Temp 97.6 F (36.4 C) (Oral)   Resp 14   Ht 5' 10.5 (1.791 m)   Wt 194 lb (88 kg)   SpO2 96%   BMI 27.44 kg/m    Physical Exam:  Physical Exam  Laboratory studies: {Labs :18171}   Imaging:  ***   Assessment:  89 y.o. yo Male with ***.  Plan:  - ***  - *** - *** - Follow up  Future Appointments  Date Time Provider Department Center  04/06/2024 10:00 AM [facility] None  04/13/2024 10:30 AM Eduardo Grade, MD [facility] None  05/18/2024  2:00 PM [facility] None  05/29/2024 12:00 PM McKenzie, Arden Beck, MD [facility] None  07/20/2024 11:00 AM [place] Jaynee Meyer, MD [facility] None     Deena Farrier, MD [facility] [address] [place], [place] [address] [phone] (office)

## 2024-03-12 DIAGNOSIS — H43823 Vitreomacular adhesion, bilateral: Secondary | ICD-10-CM | POA: Diagnosis not present

## 2024-03-12 DIAGNOSIS — H353211 Exudative age-related macular degeneration, right eye, with active choroidal neovascularization: Secondary | ICD-10-CM | POA: Diagnosis not present

## 2024-03-12 DIAGNOSIS — H353132 Nonexudative age-related macular degeneration, bilateral, intermediate dry stage: Secondary | ICD-10-CM | POA: Diagnosis not present

## 2024-04-06 ENCOUNTER — Inpatient Hospital Stay: Payer: Medicare Other | Attending: Hematology

## 2024-04-06 DIAGNOSIS — Z85038 Personal history of other malignant neoplasm of large intestine: Secondary | ICD-10-CM | POA: Insufficient documentation

## 2024-04-06 DIAGNOSIS — Z9049 Acquired absence of other specified parts of digestive tract: Secondary | ICD-10-CM | POA: Diagnosis not present

## 2024-04-06 DIAGNOSIS — Z08 Encounter for follow-up examination after completed treatment for malignant neoplasm: Secondary | ICD-10-CM | POA: Diagnosis not present

## 2024-04-06 DIAGNOSIS — D509 Iron deficiency anemia, unspecified: Secondary | ICD-10-CM

## 2024-04-06 DIAGNOSIS — E611 Iron deficiency: Secondary | ICD-10-CM | POA: Diagnosis not present

## 2024-04-06 DIAGNOSIS — R7301 Impaired fasting glucose: Secondary | ICD-10-CM | POA: Diagnosis not present

## 2024-04-06 DIAGNOSIS — E039 Hypothyroidism, unspecified: Secondary | ICD-10-CM | POA: Diagnosis not present

## 2024-04-06 DIAGNOSIS — C180 Malignant neoplasm of cecum: Secondary | ICD-10-CM

## 2024-04-06 DIAGNOSIS — Z8546 Personal history of malignant neoplasm of prostate: Secondary | ICD-10-CM | POA: Insufficient documentation

## 2024-04-06 DIAGNOSIS — D649 Anemia, unspecified: Secondary | ICD-10-CM | POA: Diagnosis not present

## 2024-04-06 LAB — IRON AND TIBC
Iron: 115 ug/dL (ref 45–182)
Saturation Ratios: 39 % (ref 17.9–39.5)
TIBC: 299 ug/dL (ref 250–450)
UIBC: 184 ug/dL

## 2024-04-06 LAB — COMPREHENSIVE METABOLIC PANEL WITH GFR
ALT: 16 U/L (ref 0–44)
AST: 23 U/L (ref 15–41)
Albumin: 3.9 g/dL (ref 3.5–5.0)
Alkaline Phosphatase: 57 U/L (ref 38–126)
Anion gap: 9 (ref 5–15)
BUN: 18 mg/dL (ref 8–23)
CO2: 27 mmol/L (ref 22–32)
Calcium: 9 mg/dL (ref 8.9–10.3)
Chloride: 102 mmol/L (ref 98–111)
Creatinine, Ser: 0.76 mg/dL (ref 0.61–1.24)
GFR, Estimated: >60 mL/min (ref >60)
Glucose, Bld: 98 mg/dL (ref 70–99)
Potassium: 4.2 mmol/L (ref 3.5–5.1)
Sodium: 138 mmol/L (ref 135–145)
Total Bilirubin: 2.4 mg/dL — ABNORMAL HIGH (ref 0.0–1.2)
Total Protein: 6.8 g/dL (ref 6.5–8.1)

## 2024-04-06 LAB — CBC WITH DIFFERENTIAL/PLATELET
Abs Immature Granulocytes: 0.03 K/uL (ref 0.00–0.07)
Basophils Absolute: 0 K/uL (ref 0.0–0.1)
Basophils Relative: 0 %
Eosinophils Absolute: 0.2 K/uL (ref 0.0–0.5)
Eosinophils Relative: 2 %
HCT: 41.6 % (ref 39.0–52.0)
Hemoglobin: 14.1 g/dL (ref 13.0–17.0)
Immature Granulocytes: 0 %
Lymphocytes Relative: 26 %
Lymphs Abs: 1.7 K/uL (ref 0.7–4.0)
MCH: 33.9 pg (ref 26.0–34.0)
MCHC: 33.9 g/dL (ref 30.0–36.0)
MCV: 100 fL (ref 80.0–100.0)
Monocytes Absolute: 0.8 K/uL (ref 0.1–1.0)
Monocytes Relative: 11 %
Neutro Abs: 4 K/uL (ref 1.7–7.7)
Neutrophils Relative %: 61 %
Platelets: 181 K/uL (ref 150–400)
RBC: 4.16 MIL/uL — ABNORMAL LOW (ref 4.22–5.81)
RDW: 13.6 % (ref 11.5–15.5)
WBC: 6.7 K/uL (ref 4.0–10.5)
nRBC: 0.3 % — ABNORMAL HIGH (ref 0.0–0.2)

## 2024-04-06 LAB — FERRITIN: Ferritin: 26 ng/mL (ref 24–336)

## 2024-04-07 LAB — CEA: CEA: 3.2 ng/mL (ref 0.0–4.7)

## 2024-04-10 DIAGNOSIS — M25562 Pain in left knee: Secondary | ICD-10-CM | POA: Diagnosis not present

## 2024-04-10 DIAGNOSIS — M545 Low back pain, unspecified: Secondary | ICD-10-CM | POA: Diagnosis not present

## 2024-04-10 DIAGNOSIS — Z6829 Body mass index (BMI) 29.0-29.9, adult: Secondary | ICD-10-CM | POA: Diagnosis not present

## 2024-04-10 DIAGNOSIS — H6123 Impacted cerumen, bilateral: Secondary | ICD-10-CM | POA: Diagnosis not present

## 2024-04-10 DIAGNOSIS — R7301 Impaired fasting glucose: Secondary | ICD-10-CM | POA: Diagnosis not present

## 2024-04-10 DIAGNOSIS — G4733 Obstructive sleep apnea (adult) (pediatric): Secondary | ICD-10-CM | POA: Diagnosis not present

## 2024-04-10 DIAGNOSIS — C61 Malignant neoplasm of prostate: Secondary | ICD-10-CM | POA: Diagnosis not present

## 2024-04-10 DIAGNOSIS — D649 Anemia, unspecified: Secondary | ICD-10-CM | POA: Diagnosis not present

## 2024-04-10 DIAGNOSIS — G8929 Other chronic pain: Secondary | ICD-10-CM | POA: Diagnosis not present

## 2024-04-10 DIAGNOSIS — E039 Hypothyroidism, unspecified: Secondary | ICD-10-CM | POA: Diagnosis not present

## 2024-04-11 NOTE — Progress Notes (Deleted)
 Patient Care Team: Shona Norleen PEDLAR, MD as PCP - General (Internal Medicine) Harvey Margo CROME, MD (Inactive) (Gastroenterology) Cindie Carlin POUR, DO as Consulting Physician (Internal Medicine)  Clinic Day:  04/12/2024  Referring physician: Shona Norleen PEDLAR, MD   CHIEF COMPLAINT:  CC: Stage II cecal adenocarcinoma  Jerry Macdonald 89 y.o. male was transferred to my care after his prior physician has left.   ASSESSMENT & PLAN:   Assessment & Plan: Jerry Macdonald  is a 89 y.o. 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  Iron panel within normal limits. - Surveillance per NCCN guidelines: - H&E every 3 to 6 months for 2 years, then every 6 months for a total of 5 years - CEA every 3 to 6 months for 2 years, then every 6 months for a total of 5 years - CT CAP every 6 to 12 months from date of surgery for a total of 5 years - Repeat CT and CEA in January 2026.  Return to clinic after CT scan to discuss results and in follow-up. Prostate cancer [facility]) Prostate cancer diagnosed in February 2016 and repeat biopsy in May 2017 Gleason score 3+4 equal to 7(grade group 2) PSA stable with Finasteride .  Last PSA 7.1 in March 2025. Was seen by Dr. Watt in March 2025  -Continue to follow-up with urology    The patient understands the plans discussed today and is in agreement with them.  He knows to contact our office if he develops concerns prior to his next appointment.  *** minutes of total time was spent for this patient encounter, including preparation, face-to-face counseling with the patient and coordination of care, physical exam, and documentation of the encounter. > 50% of the time was spent on counseling as documented under my assessment and  plan.    Jerry Dry, MD  [facility] [name] [address] [place] [address] Dept: [phone] Dept Fax: [phone]   No orders of the defined types were placed in this encounter.    ONCOLOGY HISTORY:   I have reviewed his chart and materials related to his cancer extensively and collaborated history with the patient. Summary of oncologic history is as follows:   Stage II Cecal adenocarcinoma, pMMR:  -08/16/2020: Colonoscopy: Prep was inadequate.  Likely malignant partially obstructing tumor in the cecum and at the ileocecal valve. -08/16/2020: Pathology: Colon mass biopsy: Adenocarcinoma.  pMMR -08/22/2020: CT CAP: Cecal carcinoma with ileocolic mesenteric lymph nodes, suspicious for local regional involvement.  No evidence of distant metastatic spread. -09/06/2020: CEA: 6.6 -09/28/2020: Right colectomy -09/28/2020: Pathology: Colon, right, resection: Invasive colorectal adeno carcinoma, 5 cm.  Margins not involved.  20 benign lymph nodes(0/20).  No lymphovascular invasion.  No perineural invasion.  Moderately differentiated G2.  pT3, pN0.  pMMR -12/29/2020-04/06/2024: CEA: Normal -12/29/2020: CT AP: Expected postop changes from right colectomy.  No evidence of recurrent or metastatic carcinoma within the abdomen and pelvis -07/21/2021, 02/12/2022,09/10/2022,09/30/2023: CT AP: NED  Current Treatment:  Surveillance  INTERVAL HISTORY:   Jerry Macdonald is here today for follow up. Patient is accompanied by *** .     I have reviewed the past medical history, past  surgical history, social history and family history with the patient and they are unchanged from previous note.  ALLERGIES:  is allergic to penicillins and penicillin g.  MEDICATIONS:  Current Outpatient Medications  Medication Sig Dispense Refill   acetaminophen  (TYLENOL ) 500 MG tablet Take 500 mg by mouth every 8 (eight)  hours as needed for moderate pain.     calcium -vitamin D  (OSCAL WITH D) 500-5 MG-MCG tablet Take 1 tablet by mouth in the morning and at bedtime. Lunch and dinner 180 tablet 1   Cholecalciferol  (VITAMIN D3) 25 MCG (1000 UT) CAPS Take 1 capsule by mouth 3 (three) times daily.     ferrous sulfate 324 MG TBEC Take 324 mg by mouth every other day.     finasteride  (PROSCAR ) 5 MG tablet Take 1 tablet (5 mg total) by mouth daily. 90 tablet 3   levothyroxine  (SYNTHROID ) 100 MCG tablet Take 1 tablet (100 mcg total) by mouth daily before breakfast. 90 tablet 1   magnesium  oxide (MAG-OX) 400 MG tablet Take 400 mg by mouth 2 (two) times daily.     Multiple Vitamins-Minerals (MULTIVITAMINS THER. W/MINERALS) TABS Take 1 tablet by mouth daily.     ondansetron  (ZOFRAN -ODT) 4 MG disintegrating tablet Take 1 tablet (4 mg total) by mouth every 6 (six) hours as needed for nausea. 20 tablet 0   pantoprazole  (PROTONIX ) 40 MG tablet TAKE 1 TABLET ONCE A DAY BEFORE A MEAL, MAY TAKE SECOND TABLET BEFORE EVENING MEAL IF NEEDED 180 tablet 3   tamsulosin  (FLOMAX ) 0.4 MG CAPS capsule Take 1 capsule (0.4 mg total) by mouth daily. 90 capsule 3   traMADol  (ULTRAM ) 50 MG tablet Take 1 tablet (50 mg total) by mouth every 6 (six) hours as needed for moderate pain or severe pain. 15 tablet 0   No current facility-administered medications for this visit.    REVIEW OF SYSTEMS:   Constitutional: Denies fevers, chills or abnormal weight loss Eyes: Denies blurriness of vision Ears, nose, mouth, throat, and face: Denies mucositis or sore throat Respiratory: Denies cough, dyspnea or wheezes Cardiovascular: Denies palpitation, chest discomfort or lower extremity swelling Gastrointestinal:  Denies nausea, heartburn or change in bowel habits Skin: Denies abnormal skin rashes Lymphatics: Denies new lymphadenopathy or easy bruising Neurological:Denies numbness, tingling or new weaknesses Behavioral/Psych: Mood is stable, no new changes   All other systems were reviewed with the patient and are negative.   VITALS:  There were no vitals taken for this visit.  Wt Readings from Last 3 Encounters:  02/20/24 194 lb (88 kg)  01/13/24 199 lb (90.3 kg)  10/07/23 202 lb 13.2 oz (92 kg)    There is no height or weight on file to calculate BMI.  Performance status (ECOG): {CHL ONC D053438  PHYSICAL EXAM:   GENERAL:alert, no distress and comfortable SKIN: skin color, texture, turgor are normal, no rashes or significant lesions EYES: normal, Conjunctiva are pink and non-injected, sclera clear OROPHARYNX:no exudate, no erythema and lips, buccal mucosa, and tongue normal  NECK: supple, thyroid  normal size, non-tender, without nodularity LYMPH:  no palpable lymphadenopathy in the cervical, axillary or inguinal LUNGS: clear to auscultation and percussion with normal breathing effort HEART: regular rate & rhythm and no murmurs and no lower extremity edema ABDOMEN:abdomen soft, non-tender and normal bowel sounds Musculoskeletal:no cyanosis of digits and no clubbing  NEURO: alert & oriented x 3 with fluent speech, no focal motor/sensory deficits  LABORATORY DATA:  I have reviewed the data as listed    Component Value  Date/Time   NA 138 04/06/2024 0929   NA 143 06/04/2022 1122   K 4.2 04/06/2024 0929   CL 102 04/06/2024 0929   CO2 27 04/06/2024 0929   GLUCOSE 98 04/06/2024 0929   BUN 18 04/06/2024 0929   BUN 17 06/04/2022 1122   CREATININE 0.76 04/06/2024 0929   CALCIUM  9.0 04/06/2024 0929   CALCIUM  8.6 01/23/2021 1552   PROT 6.8 04/06/2024 0929   PROT 6.6 06/04/2022 1122   ALBUMIN 3.9 04/06/2024 0929   ALBUMIN 4.1 06/04/2022 1122   AST 23 04/06/2024 0929   ALT 16 04/06/2024 0929   ALKPHOS 57 04/06/2024 0929   BILITOT 2.4 (H) 04/06/2024 0929   BILITOT 1.3 (H) 06/04/2022 1122   GFRNONAA >60 04/06/2024 0929   GFRAA  02/03/2010 1249    >60        The eGFR has been calculated using the MDRD equation. This  calculation has not been validated in all clinical situations. eGFR's persistently <60 mL/min signify possible Chronic Kidney Disease.     Lab Results  Component Value Date   WBC 6.7 04/06/2024   NEUTROABS 4.0 04/06/2024   HGB 14.1 04/06/2024   HCT 41.6 04/06/2024   MCV 100.0 04/06/2024   PLT 181 04/06/2024      Chemistry      Component Value Date/Time   NA 138 04/06/2024 0929   NA 143 06/04/2022 1122   K 4.2 04/06/2024 0929   CL 102 04/06/2024 0929   CO2 27 04/06/2024 0929   BUN 18 04/06/2024 0929   BUN 17 06/04/2022 1122   CREATININE 0.76 04/06/2024 0929      Component Value Date/Time   CALCIUM  9.0 04/06/2024 0929   CALCIUM  8.6 01/23/2021 1552   ALKPHOS 57 04/06/2024 0929   AST 23 04/06/2024 0929   ALT 16 04/06/2024 0929   BILITOT 2.4 (H) 04/06/2024 0929   BILITOT 1.3 (H) 06/04/2022 1122       RADIOGRAPHIC STUDIES: I have personally reviewed the radiological images as listed and agreed with the findings in the report. CT ABDOMEN PELVIS W CONTRAST CLINICAL DATA:  History of cecal adenocarcinoma, monitor. * Tracking Code: BO *  EXAM: CT ABDOMEN AND PELVIS WITH CONTRAST  TECHNIQUE: Multidetector CT imaging of the abdomen and pelvis was performed using the standard protocol following bolus administration of intravenous contrast.  RADIATION DOSE REDUCTION: This exam was performed according to the departmental dose-optimization program which includes automated exposure control, adjustment of the mA and/or kV according to patient size and/or use of iterative reconstruction technique.  CONTRAST:  OMNIPAQUE  IOHEXOL  300 MG/ML  SOLN  COMPARISON:  Multiple priors including most recent CT September 10, 2022  FINDINGS: Lower chest: Calcified pleural plaques compatible with prior asbestos exposure.  Hepatobiliary: Stable 19 mm cyst in the left lobe of the liver. No new suspicious hepatic lesion. Cholelithiasis without findings of acute cholecystitis. No  biliary ductal dilation.  Pancreas: No pancreatic ductal dilation or evidence of acute inflammation.  Spleen: No splenomegaly.  Adrenals/Urinary Tract: Bilateral adrenal glands appear normal. Pelvic ectopic right kidney. No left hydronephrosis. Prominence of the left ureter a portion of which is contained within a right inguinal hernia. Kidneys demonstrate symmetric enhancement. Urinary bladder is unremarkable for degree of distension.  Stomach/Bowel: Radiopaque enteric contrast material reaches the splenic flexure. Moderate-sized hiatal hernia. No pathologic dilation of small or large bowel. Colonic diverticulosis without findings of acute diverticulitis.  Prior right hemicolectomy without evidence of local recurrence.  Vascular/Lymphatic: Aortic atherosclerosis. Normal  caliber abdominal aorta. Decompressed IVC. No pathologically enlarged abdominal or pelvic lymph nodes.  Reproductive: Enlarged prostate gland.  Other: Large right inguinal hernia contains fat and dilated portion of right ureter. Fat containing left inguinal hernia.  Musculoskeletal: Multilevel degenerative change of the spine with multifocal Schmorl's node formation. No aggressive lytic or blastic lesion of bone. Diffuse demineralization of bone.  IMPRESSION: 1. Prior right hemicolectomy without evidence of local recurrence or metastatic disease in the abdomen or pelvis. 2. Large right inguinal hernia contains fat and dilated portion of right ureter. 3. Cholelithiasis without findings of acute cholecystitis. 4. Colonic diverticulosis without findings of acute diverticulitis. 5. Moderate-sized hiatal hernia. 6.  Aortic Atherosclerosis (ICD10-I70.0).  Electronically Signed   By: Reyes Holder M.D.   On: 10/06/2023 08:55

## 2024-04-12 NOTE — Assessment & Plan Note (Deleted)
 Prostate cancer diagnosed in February 2016 and repeat biopsy in May 2017 Gleason score 3+4 equal to 7(grade group 2) PSA stable with Finasteride .  Last PSA 7.1 in March 2025. Was seen by Dr. Watt in March 2025  -Continue to follow-up with urology

## 2024-04-12 NOTE — Assessment & Plan Note (Deleted)
 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  Iron panel within normal limits. - Surveillance per NCCN guidelines: - H&E every 3 to 6 months for 2 years, then every 6 months for a total of 5 years - CEA every 3 to 6 months for 2 years, then every 6 months for a total of 5 years - CT CAP every 6 to 12 months from date of surgery for a total of 5 years - Repeat CT and CEA in January 2026.  Return to clinic after CT scan to discuss results and in follow-up.

## 2024-04-13 ENCOUNTER — Inpatient Hospital Stay: Payer: Medicare Other | Admitting: Oncology

## 2024-04-13 DIAGNOSIS — C61 Malignant neoplasm of prostate: Secondary | ICD-10-CM

## 2024-04-13 DIAGNOSIS — C180 Malignant neoplasm of cecum: Secondary | ICD-10-CM

## 2024-04-16 DIAGNOSIS — H43821 Vitreomacular adhesion, right eye: Secondary | ICD-10-CM | POA: Diagnosis not present

## 2024-04-16 DIAGNOSIS — H43822 Vitreomacular adhesion, left eye: Secondary | ICD-10-CM | POA: Diagnosis not present

## 2024-04-16 DIAGNOSIS — H353211 Exudative age-related macular degeneration, right eye, with active choroidal neovascularization: Secondary | ICD-10-CM | POA: Diagnosis not present

## 2024-04-16 DIAGNOSIS — H353132 Nonexudative age-related macular degeneration, bilateral, intermediate dry stage: Secondary | ICD-10-CM | POA: Diagnosis not present

## 2024-04-20 NOTE — Progress Notes (Unsigned)
 Patient Care Team: Shona Norleen PEDLAR, MD as PCP - General (Internal Medicine) Harvey Margo CROME, MD (Inactive) (Gastroenterology) Cindie Carlin POUR, DO as Consulting Physician (Internal Medicine)  Clinic Day:  04/20/2024  Referring physician: Shona Norleen PEDLAR, MD   CHIEF COMPLAINT:  CC: Stage II cecal adenocarcinoma  Jerry Macdonald 89 y.o. male was transferred to my care after his prior physician has left.   ASSESSMENT & PLAN:   Assessment & Plan: Deloy C Brunkhorst  is a 89 y.o. male with cecal adenocarcinoma  Assessment & Plan     The patient understands the plans discussed today and is in agreement with them.  He knows to contact our office if he develops concerns prior to his next appointment.  *** minutes of total time was spent for this patient encounter, including preparation, face-to-face counseling with the patient and coordination of care, physical exam, and documentation of the encounter. > 50% of the time was spent on counseling as documented under my assessment and plan.    Mickiel Dry, MD  [facility] [place] - A DEPT OF [facility] [address] [place] [address] Dept: [phone] Dept Fax: [phone]   No orders of the defined types were placed in this encounter.    ONCOLOGY HISTORY:   I have reviewed his chart and materials related to his cancer extensively and collaborated history with the patient. Summary of oncologic history is as follows:   Stage II Cecal adenocarcinoma, pMMR:  -08/16/2020: Colonoscopy: Prep was inadequate.  Likely malignant partially obstructing tumor in the cecum and at the ileocecal valve. -08/16/2020: Pathology: Colon mass biopsy: Adenocarcinoma.  pMMR -08/22/2020: CT CAP: Cecal carcinoma with ileocolic mesenteric lymph nodes, suspicious for local regional involvement.  No evidence of distant metastatic spread. -09/06/2020: CEA: 6.6 -09/28/2020: Right  colectomy -09/28/2020: Pathology: Colon, right, resection: Invasive colorectal adeno carcinoma, 5 cm.  Margins not involved.  20 benign lymph nodes(0/20).  No lymphovascular invasion.  No perineural invasion.  Moderately differentiated G2.  pT3, pN0.  pMMR -12/29/2020-04/06/2024: CEA: Normal -12/29/2020: CT AP: Expected postop changes from right colectomy.  No evidence of recurrent or metastatic carcinoma within the abdomen and pelvis -07/21/2021, 02/12/2022,09/10/2022,09/30/2023: CT AP: NED  Current Treatment:  Surveillance  INTERVAL HISTORY:   Jerry Macdonald is here today for follow up. Patient is accompanied by *** .     I have reviewed the past medical history, past surgical history, social history and family history with the patient and they are unchanged from previous note.  ALLERGIES:  is allergic to penicillins and penicillin g.  MEDICATIONS:  Current Outpatient Medications  Medication Sig Dispense Refill   acetaminophen  (TYLENOL ) 500 MG tablet Take 500 mg by mouth every 8 (eight) hours as needed for moderate pain.     calcium -vitamin D  (OSCAL WITH D) 500-5 MG-MCG tablet Take 1 tablet by mouth in the morning and at bedtime. Lunch and dinner 180 tablet 1   Cholecalciferol  (VITAMIN D3) 25 MCG (1000 UT) CAPS Take 1 capsule by mouth 3 (three) times daily.     ferrous sulfate 324 MG TBEC Take 324 mg by mouth every other day.     finasteride  (PROSCAR ) 5 MG tablet Take 1 tablet (5 mg total) by mouth daily. 90 tablet 3   levothyroxine  (SYNTHROID ) 100 MCG tablet Take 1 tablet (100 mcg total) by mouth daily before breakfast. 90 tablet 1   magnesium  oxide (MAG-OX) 400 MG tablet Take 400 mg  by mouth 2 (two) times daily.     Multiple Vitamins-Minerals (MULTIVITAMINS THER. W/MINERALS) TABS Take 1 tablet by mouth daily.     ondansetron  (ZOFRAN -ODT) 4 MG disintegrating tablet Take 1 tablet (4 mg total) by mouth every 6 (six) hours as needed for nausea. 20 tablet 0   pantoprazole  (PROTONIX ) 40  MG tablet TAKE 1 TABLET ONCE A DAY BEFORE A MEAL, MAY TAKE SECOND TABLET BEFORE EVENING MEAL IF NEEDED 180 tablet 3   tamsulosin  (FLOMAX ) 0.4 MG CAPS capsule Take 1 capsule (0.4 mg total) by mouth daily. 90 capsule 3   traMADol  (ULTRAM ) 50 MG tablet Take 1 tablet (50 mg total) by mouth every 6 (six) hours as needed for moderate pain or severe pain. 15 tablet 0   No current facility-administered medications for this visit.    REVIEW OF SYSTEMS:   Constitutional: Denies fevers, chills or abnormal weight loss Eyes: Denies blurriness of vision Ears, nose, mouth, throat, and face: Denies mucositis or sore throat Respiratory: Denies cough, dyspnea or wheezes Cardiovascular: Denies palpitation, chest discomfort or lower extremity swelling Gastrointestinal:  Denies nausea, heartburn or change in bowel habits Skin: Denies abnormal skin rashes Lymphatics: Denies new lymphadenopathy or easy bruising Neurological:Denies numbness, tingling or new weaknesses Behavioral/Psych: Mood is stable, no new changes  All other systems were reviewed with the patient and are negative.   VITALS:  There were no vitals taken for this visit.  Wt Readings from Last 3 Encounters:  02/20/24 194 lb (88 kg)  01/13/24 199 lb (90.3 kg)  10/07/23 202 lb 13.2 oz (92 kg)    There is no height or weight on file to calculate BMI.  Performance status (ECOG): {CHL ONC D053438  PHYSICAL EXAM:   GENERAL:alert, no distress and comfortable SKIN: skin color, texture, turgor are normal, no rashes or significant lesions EYES: normal, Conjunctiva are pink and non-injected, sclera clear OROPHARYNX:no exudate, no erythema and lips, buccal mucosa, and tongue normal  NECK: supple, thyroid  normal size, non-tender, without nodularity LYMPH:  no palpable lymphadenopathy in the cervical, axillary or inguinal LUNGS: clear to auscultation and percussion with normal breathing effort HEART: regular rate & rhythm and no murmurs and  no lower extremity edema ABDOMEN:abdomen soft, non-tender and normal bowel sounds Musculoskeletal:no cyanosis of digits and no clubbing  NEURO: alert & oriented x 3 with fluent speech, no focal motor/sensory deficits  LABORATORY DATA:  I have reviewed the data as listed    Component Value Date/Time   NA 138 04/06/2024 0929   NA 143 06/04/2022 1122   K 4.2 04/06/2024 0929   CL 102 04/06/2024 0929   CO2 27 04/06/2024 0929   GLUCOSE 98 04/06/2024 0929   BUN 18 04/06/2024 0929   BUN 17 06/04/2022 1122   CREATININE 0.76 04/06/2024 0929   CALCIUM  9.0 04/06/2024 0929   CALCIUM  8.6 01/23/2021 1552   PROT 6.8 04/06/2024 0929   PROT 6.6 06/04/2022 1122   ALBUMIN 3.9 04/06/2024 0929   ALBUMIN 4.1 06/04/2022 1122   AST 23 04/06/2024 0929   ALT 16 04/06/2024 0929   ALKPHOS 57 04/06/2024 0929   BILITOT 2.4 (H) 04/06/2024 0929   BILITOT 1.3 (H) 06/04/2022 1122   GFRNONAA >60 04/06/2024 0929   GFRAA  02/03/2010 1249    >60        The eGFR has been calculated using the MDRD equation. This calculation has not been validated in all clinical situations. eGFR's persistently <60 mL/min signify possible Chronic Kidney Disease.  Lab Results  Component Value Date   WBC 6.7 04/06/2024   NEUTROABS 4.0 04/06/2024   HGB 14.1 04/06/2024   HCT 41.6 04/06/2024   MCV 100.0 04/06/2024   PLT 181 04/06/2024      Chemistry      Component Value Date/Time   NA 138 04/06/2024 0929   NA 143 06/04/2022 1122   K 4.2 04/06/2024 0929   CL 102 04/06/2024 0929   CO2 27 04/06/2024 0929   BUN 18 04/06/2024 0929   BUN 17 06/04/2022 1122   CREATININE 0.76 04/06/2024 0929      Component Value Date/Time   CALCIUM  9.0 04/06/2024 0929   CALCIUM  8.6 01/23/2021 1552   ALKPHOS 57 04/06/2024 0929   AST 23 04/06/2024 0929   ALT 16 04/06/2024 0929   BILITOT 2.4 (H) 04/06/2024 0929   BILITOT 1.3 (H) 06/04/2022 1122       RADIOGRAPHIC STUDIES: I have personally reviewed the radiological images as  listed and agreed with the findings in the report. CT ABDOMEN PELVIS W CONTRAST CLINICAL DATA:  History of cecal adenocarcinoma, monitor. * Tracking Code: BO *  EXAM: CT ABDOMEN AND PELVIS WITH CONTRAST  TECHNIQUE: Multidetector CT imaging of the abdomen and pelvis was performed using the standard protocol following bolus administration of intravenous contrast.  RADIATION DOSE REDUCTION: This exam was performed according to the departmental dose-optimization program which includes automated exposure control, adjustment of the mA and/or kV according to patient size and/or use of iterative reconstruction technique.  CONTRAST:  OMNIPAQUE  IOHEXOL  300 MG/ML  SOLN  COMPARISON:  Multiple priors including most recent CT September 10, 2022  FINDINGS: Lower chest: Calcified pleural plaques compatible with prior asbestos exposure.  Hepatobiliary: Stable 19 mm cyst in the left lobe of the liver. No new suspicious hepatic lesion. Cholelithiasis without findings of acute cholecystitis. No biliary ductal dilation.  Pancreas: No pancreatic ductal dilation or evidence of acute inflammation.  Spleen: No splenomegaly.  Adrenals/Urinary Tract: Bilateral adrenal glands appear normal. Pelvic ectopic right kidney. No left hydronephrosis. Prominence of the left ureter a portion of which is contained within a right inguinal hernia. Kidneys demonstrate symmetric enhancement. Urinary bladder is unremarkable for degree of distension.  Stomach/Bowel: Radiopaque enteric contrast material reaches the splenic flexure. Moderate-sized hiatal hernia. No pathologic dilation of small or large bowel. Colonic diverticulosis without findings of acute diverticulitis.  Prior right hemicolectomy without evidence of local recurrence.  Vascular/Lymphatic: Aortic atherosclerosis. Normal caliber abdominal aorta. Decompressed IVC. No pathologically enlarged abdominal or pelvic lymph nodes.  Reproductive:  Enlarged prostate gland.  Other: Large right inguinal hernia contains fat and dilated portion of right ureter. Fat containing left inguinal hernia.  Musculoskeletal: Multilevel degenerative change of the spine with multifocal Schmorl's node formation. No aggressive lytic or blastic lesion of bone. Diffuse demineralization of bone.  IMPRESSION: 1. Prior right hemicolectomy without evidence of local recurrence or metastatic disease in the abdomen or pelvis. 2. Large right inguinal hernia contains fat and dilated portion of right ureter. 3. Cholelithiasis without findings of acute cholecystitis. 4. Colonic diverticulosis without findings of acute diverticulitis. 5. Moderate-sized hiatal hernia. 6.  Aortic Atherosclerosis (ICD10-I70.0).  Electronically Signed   By: Reyes Holder M.D.   On: 10/06/2023 08:55

## 2024-04-23 NOTE — Assessment & Plan Note (Addendum)
 Prostate cancer diagnosed in February 2016 and repeat biopsy in May 2017 Gleason score 3+4 equal to 7(grade group 2) PSA stable with Finasteride .  Last PSA 7.1 in March 2025. Was seen by Dr. Watt in March 2025  -Continue to follow-up with urology

## 2024-04-23 NOTE — Assessment & Plan Note (Addendum)
 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  Patient and wife does not see a benefit in this.  - Labs reviewed: CEA normal, normal CBC and CMP.  Iron panel within normal limits. -Last CT scan 09/30/2023: No evidence of disease -Patient is not being monitored by [employer] - Surveillance per NCCN guidelines: - H&E every 3 to 6 months for 2 years, then every 6 months for a total of 5 years - CEA every 3 to 6 months for 2 years, then every 6 months for a total of 5 years - CT CAP every 6 to 12 months from date of surgery for a total of 5 years - Repeat CT and CEA in January 2026.  Return to clinic after CT scan to discuss results and in follow-up.

## 2024-04-23 NOTE — Progress Notes (Signed)
 Patient Care Team: Shona Norleen PEDLAR, MD as PCP - General (Internal Medicine) Harvey Margo CROME, MD (Inactive) (Gastroenterology) Cindie Carlin POUR, DO as Consulting Physician (Internal Medicine)  Clinic Day:  04/25/2024  Referring physician: Shona Norleen PEDLAR, MD   CHIEF COMPLAINT:  CC: Stage II cecal adenocarcinoma  Jerry Macdonald 89 y.o. male was transferred to my care after his prior physician has left.   ASSESSMENT & PLAN:   Assessment & Plan: Jerry Macdonald  is a 89 y.o. 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  Patient and wife does not see a benefit in this.  - Labs reviewed: CEA normal, normal CBC and CMP.  Iron panel within normal limits. -Last CT scan 09/30/2023: No evidence of disease -Patient is not being monitored by [facility] - Surveillance per NCCN guidelines: - H&E every 3 to 6 months for 2 years, then every 6 months for a total of 5 years - CEA every 3 to 6 months for 2 years, then every 6 months for a total of 5 years - CT CAP every 6 to 12 months from date of surgery for a total of 5 years - Repeat CT and CEA in January 2026.  Return to clinic after CT scan to discuss results and in follow-up. Prostate cancer [facility]) Prostate cancer diagnosed in February 2016 and repeat biopsy in May 2017 Gleason score 3+4 equal to 7(grade group 2) PSA stable with Finasteride .  Last PSA 7.1 in March 2025. Was seen by Dr. Watt in March 2025  -Continue to follow-up with urology Iron deficiency Patient had iron deficiency likely secondary to colon carcinoma Iron labs reviewed today.  Within normal limits  - Can discontinue oral iron if still taking.    The patient understands the plans discussed today and is in agreement with them.  He knows to contact our office if he develops  concerns prior to his next appointment.  40 minutes of total time was spent for this patient encounter, including preparation, face-to-face counseling with the patient and coordination of care, physical exam, and documentation of the encounter. > 50% of the time was spent on counseling as documented under my assessment and plan.    LILLETTE Verneta SAUNDERS Teague,acting as a Neurosurgeon for Mickiel Dry, MD.,have documented all relevant documentation on the behalf of Mickiel Dry, MD,as directed by  Mickiel Dry, MD while in the presence of Mickiel Dry, MD.  I, Mickiel Dry MD, have reviewed the above documentation for accuracy and completeness, and I agree with the above.     Mickiel Dry, MD  [facility] - A DEPT OF [facility] [address] [place] [address] Dept: [phone] Dept Fax: [phone]   Orders Placed This Encounter  Procedures   CT ABDOMEN PELVIS W CONTRAST    Standing Status:   Future    Expected Date:   10/25/2024    Expiration Date:   04/24/2025    If indicated for the ordered procedure, I authorize the administration of contrast media per Radiology protocol:   Yes    Does the patient have a contrast media/X-ray dye allergy?:   Yes    Preferred imaging location?:   [facility]    If indicated for the ordered procedure, I authorize the  administration of oral contrast media per Radiology protocol:   Yes   CBC with Differential/Platelet    Standing Status:   Future    Expected Date:   10/19/2024    Expiration Date:   01/17/2025   Comprehensive metabolic panel with GFR    Standing Status:   Future    Expected Date:   10/19/2024    Expiration Date:   01/17/2025   CEA    Standing Status:   Future    Expected Date:   10/19/2024    Expiration Date:   01/17/2025   Ferritin    Standing Status:   Future    Expected Date:   10/19/2024    Expiration Date:   01/17/2025   Iron and TIBC    Standing  Status:   Future    Expected Date:   10/19/2024    Expiration Date:   01/17/2025     ONCOLOGY HISTORY:   I have reviewed his chart and materials related to his cancer extensively and collaborated history with the patient. Summary of oncologic history is as follows:   Stage II Cecal adenocarcinoma, pMMR:  -08/16/2020: Colonoscopy: Prep was inadequate.  Likely malignant partially obstructing tumor in the cecum and at the ileocecal valve. -08/16/2020: Pathology: Colon mass biopsy: Adenocarcinoma.  pMMR -08/22/2020: CT CAP: Cecal carcinoma with ileocolic mesenteric lymph nodes, suspicious for local regional involvement.  No evidence of distant metastatic spread. -09/06/2020: CEA: 6.6 -09/28/2020: Right colectomy -09/28/2020: Pathology: Colon, right, resection: Invasive colorectal adeno carcinoma, 5 cm.  Margins not involved.  20 benign lymph nodes(0/20).  No lymphovascular invasion.  No perineural invasion.  Moderately differentiated G2.  pT3, pN0.  pMMR -12/29/2020-04/06/2024: CEA: Normal -12/29/2020: CT [facility]: Expected postop changes from right colectomy.  No evidence of recurrent or metastatic carcinoma within the abdomen and pelvis -07/21/2021, 02/12/2022,09/10/2022,09/30/2023: CT [facility]: NED  Current Treatment:  Surveillance  INTERVAL HISTORY:   Jerry Macdonald is here today for follow up. Patient is accompanied by his wife today.  He has no complaints today.  Doing very well.  Denies melena, hematochezia, abdominal pain, loss of weight.  Reports that he has an abdominal hernia and will be seeing Dr. Kallie for the same.    I have reviewed the past medical history, past surgical history, social history and family history with the patient and they are unchanged from previous note.  ALLERGIES:  is allergic to penicillins and penicillin g.  MEDICATIONS:  Current Outpatient Medications  Medication Sig Dispense Refill   acetaminophen  (TYLENOL ) 500 MG tablet Take 500 mg by mouth every 8 (eight)  hours as needed for moderate pain.     calcium -vitamin D  (OSCAL WITH D) 500-5 MG-MCG tablet Take 1 tablet by mouth in the morning and at bedtime. Lunch and dinner 180 tablet 1   Cholecalciferol  (VITAMIN D3) 25 MCG (1000 UT) CAPS Take 1 capsule by mouth 3 (three) times daily.     ferrous sulfate 324 MG TBEC Take 324 mg by mouth every other day.     finasteride  (PROSCAR ) 5 MG tablet Take 1 tablet (5 mg total) by mouth daily. 90 tablet 3   levothyroxine  (SYNTHROID ) 100 MCG tablet Take 1 tablet (100 mcg total) by mouth daily before breakfast. 90 tablet 1   magnesium  oxide (MAG-OX) 400 MG tablet Take 400 mg by mouth 2 (two) times daily.     Multiple Vitamins-Minerals (MULTIVITAMINS THER. W/MINERALS) TABS Take 1 tablet by mouth daily.     ondansetron  (ZOFRAN -ODT) 4 MG disintegrating  tablet Take 1 tablet (4 mg total) by mouth every 6 (six) hours as needed for nausea. 20 tablet 0   pantoprazole  (PROTONIX ) 40 MG tablet TAKE 1 TABLET ONCE A DAY BEFORE A MEAL, MAY TAKE SECOND TABLET BEFORE EVENING MEAL IF NEEDED 180 tablet 3   tamsulosin  (FLOMAX ) 0.4 MG CAPS capsule Take 1 capsule (0.4 mg total) by mouth daily. 90 capsule 3   traMADol  (ULTRAM ) 50 MG tablet Take 1 tablet (50 mg total) by mouth every 6 (six) hours as needed for moderate pain or severe pain. 15 tablet 0   No current facility-administered medications for this visit.    REVIEW OF SYSTEMS:   Constitutional: Denies fevers, chills or abnormal weight loss Eyes: Denies blurriness of vision Ears, nose, mouth, throat, and face: Denies mucositis or sore throat Respiratory: Denies cough, dyspnea or wheezes Cardiovascular: Denies palpitation, chest discomfort or lower extremity swelling Gastrointestinal:  Denies nausea, heartburn or change in bowel habits Skin: Denies abnormal skin rashes Lymphatics: Denies new lymphadenopathy or easy bruising Neurological:Denies numbness, tingling or new weaknesses Behavioral/Psych: Mood is stable, no new changes   All other systems were reviewed with the patient and are negative.   VITALS:  Blood pressure 103/78, pulse 94, temperature 97.6 F (36.4 C), temperature source Oral, resp. rate 18, weight 195 lb 8 oz (88.7 kg), SpO2 97%.  Wt Readings from Last 3 Encounters:  04/24/24 195 lb 8 oz (88.7 kg)  02/20/24 194 lb (88 kg)  01/13/24 199 lb (90.3 kg)    Body mass index is 27.65 kg/m.  Performance status (ECOG): 1 - Symptomatic but completely ambulatory  PHYSICAL EXAM:   GENERAL:alert, no distress and comfortable SKIN: skin color, texture, turgor are normal, no rashes or significant lesions LYMPH:  no palpable lymphadenopathy in the cervical, axillary or inguinal LUNGS: clear to auscultation and percussion with normal breathing effort HEART: regular rate & rhythm and no murmurs and no lower extremity edema ABDOMEN:abdomen soft, non-tender and normal bowel sounds, small periumbilical hernia present. Musculoskeletal:no cyanosis of digits and no clubbing  NEURO: alert & oriented x 3 with fluent speech, no focal motor/sensory deficits  LABORATORY DATA:  I have reviewed the data as listed    Component Value Date/Time   NA 138 04/06/2024 0929   NA 143 06/04/2022 1122   K 4.2 04/06/2024 0929   CL 102 04/06/2024 0929   CO2 27 04/06/2024 0929   GLUCOSE 98 04/06/2024 0929   BUN 18 04/06/2024 0929   BUN 17 06/04/2022 1122   CREATININE 0.76 04/06/2024 0929   CALCIUM  9.0 04/06/2024 0929   CALCIUM  8.6 01/23/2021 1552   PROT 6.8 04/06/2024 0929   PROT 6.6 06/04/2022 1122   ALBUMIN 3.9 04/06/2024 0929   ALBUMIN 4.1 06/04/2022 1122   AST 23 04/06/2024 0929   ALT 16 04/06/2024 0929   ALKPHOS 57 04/06/2024 0929   BILITOT 2.4 (H) 04/06/2024 0929   BILITOT 1.3 (H) 06/04/2022 1122   GFRNONAA >60 04/06/2024 0929   GFRAA  02/03/2010 1249    >60        The eGFR has been calculated using the MDRD equation. This calculation has not been validated in all clinical situations. eGFR's  persistently <60 mL/min signify possible Chronic Kidney Disease.     Lab Results  Component Value Date   WBC 6.7 04/06/2024   NEUTROABS 4.0 04/06/2024   HGB 14.1 04/06/2024   HCT 41.6 04/06/2024   MCV 100.0 04/06/2024   PLT 181 04/06/2024  Chemistry      Component Value Date/Time   NA 138 04/06/2024 0929   NA 143 06/04/2022 1122   K 4.2 04/06/2024 0929   CL 102 04/06/2024 0929   CO2 27 04/06/2024 0929   BUN 18 04/06/2024 0929   BUN 17 06/04/2022 1122   CREATININE 0.76 04/06/2024 0929      Component Value Date/Time   CALCIUM  9.0 04/06/2024 0929   CALCIUM  8.6 01/23/2021 1552   ALKPHOS 57 04/06/2024 0929   AST 23 04/06/2024 0929   ALT 16 04/06/2024 0929   BILITOT 2.4 (H) 04/06/2024 0929   BILITOT 1.3 (H) 06/04/2022 1122      Latest Reference Range & Units 04/06/24 09:29  Iron 45 - 182 ug/dL 884  UIBC ug/dL 815  TIBC 749 - 549 ug/dL 700  Saturation Ratios 17.9 - 39.5 % 39  Ferritin 24 - 336 ng/mL 26    Latest Reference Range & Units 04/06/24 09:29  CEA 0.0 - 4.7 ng/mL 3.2   RADIOGRAPHIC STUDIES: I have personally reviewed the radiological images as listed and agreed with the findings in the report. CT ABDOMEN PELVIS W CONTRAST CLINICAL DATA:  History of cecal adenocarcinoma, monitor. * Tracking Code: BO *  EXAM: CT ABDOMEN AND PELVIS WITH CONTRAST  TECHNIQUE: Multidetector CT imaging of the abdomen and pelvis was performed using the standard protocol following bolus administration of intravenous contrast.  RADIATION DOSE REDUCTION: This exam was performed according to the departmental dose-optimization program which includes automated exposure control, adjustment of the mA and/or kV according to patient size and/or use of iterative reconstruction technique.  CONTRAST:  OMNIPAQUE  IOHEXOL  300 MG/ML  SOLN  COMPARISON:  Multiple priors including most recent CT September 10, 2022  FINDINGS: Lower chest: Calcified pleural plaques compatible with  prior asbestos exposure.  Hepatobiliary: Stable 19 mm cyst in the left lobe of the liver. No new suspicious hepatic lesion. Cholelithiasis without findings of acute cholecystitis. No biliary ductal dilation.  Pancreas: No pancreatic ductal dilation or evidence of acute inflammation.  Spleen: No splenomegaly.  Adrenals/Urinary Tract: Bilateral adrenal glands appear normal. Pelvic ectopic right kidney. No left hydronephrosis. Prominence of the left ureter a portion of which is contained within a right inguinal hernia. Kidneys demonstrate symmetric enhancement. Urinary bladder is unremarkable for degree of distension.  Stomach/Bowel: Radiopaque enteric contrast material reaches the splenic flexure. Moderate-sized hiatal hernia. No pathologic dilation of small or large bowel. Colonic diverticulosis without findings of acute diverticulitis.  Prior right hemicolectomy without evidence of local recurrence.  Vascular/Lymphatic: Aortic atherosclerosis. Normal caliber abdominal aorta. Decompressed IVC. No pathologically enlarged abdominal or pelvic lymph nodes.  Reproductive: Enlarged prostate gland.  Other: Large right inguinal hernia contains fat and dilated portion of right ureter. Fat containing left inguinal hernia.  Musculoskeletal: Multilevel degenerative change of the spine with multifocal Schmorl's node formation. No aggressive lytic or blastic lesion of bone. Diffuse demineralization of bone.  IMPRESSION: 1. Prior right hemicolectomy without evidence of local recurrence or metastatic disease in the abdomen or pelvis. 2. Large right inguinal hernia contains fat and dilated portion of right ureter. 3. Cholelithiasis without findings of acute cholecystitis. 4. Colonic diverticulosis without findings of acute diverticulitis. 5. Moderate-sized hiatal hernia. 6.  Aortic Atherosclerosis (ICD10-I70.0).  Electronically Signed   By: Reyes Holder M.D.   On: 10/06/2023 08:55

## 2024-04-24 ENCOUNTER — Inpatient Hospital Stay (HOSPITAL_BASED_OUTPATIENT_CLINIC_OR_DEPARTMENT_OTHER): Admitting: Oncology

## 2024-04-24 VITALS — BP 103/78 | HR 94 | Temp 97.6°F | Resp 18 | Wt 195.5 lb

## 2024-04-24 DIAGNOSIS — Z8546 Personal history of malignant neoplasm of prostate: Secondary | ICD-10-CM | POA: Diagnosis not present

## 2024-04-24 DIAGNOSIS — C180 Malignant neoplasm of cecum: Secondary | ICD-10-CM | POA: Diagnosis not present

## 2024-04-24 DIAGNOSIS — Z85038 Personal history of other malignant neoplasm of large intestine: Secondary | ICD-10-CM | POA: Diagnosis not present

## 2024-04-24 DIAGNOSIS — Z08 Encounter for follow-up examination after completed treatment for malignant neoplasm: Secondary | ICD-10-CM | POA: Diagnosis not present

## 2024-04-24 DIAGNOSIS — Z9049 Acquired absence of other specified parts of digestive tract: Secondary | ICD-10-CM | POA: Diagnosis not present

## 2024-04-24 DIAGNOSIS — C61 Malignant neoplasm of prostate: Secondary | ICD-10-CM

## 2024-04-24 DIAGNOSIS — E611 Iron deficiency: Secondary | ICD-10-CM

## 2024-04-25 DIAGNOSIS — E611 Iron deficiency: Secondary | ICD-10-CM | POA: Insufficient documentation

## 2024-04-25 NOTE — Assessment & Plan Note (Signed)
 Patient had iron deficiency likely secondary to colon carcinoma Iron labs reviewed today.  Within normal limits  - Can discontinue oral iron if still taking.

## 2024-05-08 DIAGNOSIS — Z23 Encounter for immunization: Secondary | ICD-10-CM | POA: Diagnosis not present

## 2024-05-13 DIAGNOSIS — Z23 Encounter for immunization: Secondary | ICD-10-CM | POA: Diagnosis not present

## 2024-05-18 ENCOUNTER — Other Ambulatory Visit

## 2024-05-18 DIAGNOSIS — C61 Malignant neoplasm of prostate: Secondary | ICD-10-CM | POA: Diagnosis not present

## 2024-05-18 DIAGNOSIS — R972 Elevated prostate specific antigen [PSA]: Secondary | ICD-10-CM

## 2024-05-19 ENCOUNTER — Ambulatory Visit: Payer: Self-pay

## 2024-05-19 ENCOUNTER — Other Ambulatory Visit

## 2024-05-19 LAB — PSA, TOTAL AND FREE
PSA, Free Pct: 7.8 %
PSA, Free: 0.58 ng/mL
Prostate Specific Ag, Serum: 7.4 ng/mL — ABNORMAL HIGH (ref 0.0–4.0)

## 2024-05-25 DIAGNOSIS — H02834 Dermatochalasis of left upper eyelid: Secondary | ICD-10-CM | POA: Diagnosis not present

## 2024-05-25 DIAGNOSIS — H43823 Vitreomacular adhesion, bilateral: Secondary | ICD-10-CM | POA: Diagnosis not present

## 2024-05-25 DIAGNOSIS — H353211 Exudative age-related macular degeneration, right eye, with active choroidal neovascularization: Secondary | ICD-10-CM | POA: Diagnosis not present

## 2024-05-25 DIAGNOSIS — H02831 Dermatochalasis of right upper eyelid: Secondary | ICD-10-CM | POA: Diagnosis not present

## 2024-05-25 DIAGNOSIS — H353121 Nonexudative age-related macular degeneration, left eye, early dry stage: Secondary | ICD-10-CM | POA: Diagnosis not present

## 2024-05-25 DIAGNOSIS — H2181 Floppy iris syndrome: Secondary | ICD-10-CM | POA: Diagnosis not present

## 2024-05-25 DIAGNOSIS — H5703 Miosis: Secondary | ICD-10-CM | POA: Diagnosis not present

## 2024-05-26 ENCOUNTER — Ambulatory Visit: Admitting: Urology

## 2024-05-28 DIAGNOSIS — H43822 Vitreomacular adhesion, left eye: Secondary | ICD-10-CM | POA: Diagnosis not present

## 2024-05-28 DIAGNOSIS — H353132 Nonexudative age-related macular degeneration, bilateral, intermediate dry stage: Secondary | ICD-10-CM | POA: Diagnosis not present

## 2024-05-28 DIAGNOSIS — H353211 Exudative age-related macular degeneration, right eye, with active choroidal neovascularization: Secondary | ICD-10-CM | POA: Diagnosis not present

## 2024-05-28 DIAGNOSIS — H43821 Vitreomacular adhesion, right eye: Secondary | ICD-10-CM | POA: Diagnosis not present

## 2024-05-29 ENCOUNTER — Ambulatory Visit: Admitting: Urology

## 2024-05-29 VITALS — BP 111/73 | HR 116

## 2024-05-29 DIAGNOSIS — R351 Nocturia: Secondary | ICD-10-CM

## 2024-05-29 DIAGNOSIS — C61 Malignant neoplasm of prostate: Secondary | ICD-10-CM | POA: Diagnosis not present

## 2024-05-29 DIAGNOSIS — N138 Other obstructive and reflux uropathy: Secondary | ICD-10-CM

## 2024-05-29 DIAGNOSIS — R972 Elevated prostate specific antigen [PSA]: Secondary | ICD-10-CM | POA: Diagnosis not present

## 2024-05-29 DIAGNOSIS — N401 Enlarged prostate with lower urinary tract symptoms: Secondary | ICD-10-CM

## 2024-05-29 MED ORDER — FINASTERIDE 5 MG PO TABS: 5.0000 mg | ORAL_TABLET | Freq: Every day | ORAL | 3 refills | Status: AC

## 2024-05-29 MED ORDER — TAMSULOSIN HCL 0.4 MG PO CAPS: 0.4000 mg | ORAL_CAPSULE | Freq: Every day | ORAL | 3 refills | Status: AC

## 2024-05-29 NOTE — Progress Notes (Signed)
 05/29/2024 12:43 PM   Jerry Macdonald 1930/12/22 [id number]  Referring provider: Shona Norleen PEDLAR, MD [address] [place],  [place] [address]  Followup prostate cancer    HPI: [name] is a 89yo here for followup for prostate cancer and BPH. PSA 7.4 up from 7.1. IPSS 14 QOl 2 on flomax  0.4mg  daily. Urine stream usually strong. No straining to urinate. No urinary hesitancy. Nocturia 2x depending on fluid consumption.Jerry Macdonald    PMH: Past Medical History:  Diagnosis Date   Abnormal prostate biopsy    Back pain    with radiculopathy   Cataracts, bilateral    Constipation    Family history of lung cancer    Family history of ovarian cancer    Family history of prostate cancer    Goiter    HOH (hard of hearing)    Hyperlipidemia    Hypertrophy of prostate    Malaise and fatigue    Obesity    Pyogenic granuloma    left cheeck   S/P colonoscopy June 2011   rectal bleeding secondary to hemorrhoids   S/P colonoscopy 2007   [facility], diverticulosis per Dr. milford note   S/P endoscopy June 2011   Barrett's, mild gastritis and duodenitis   Seborrheic keratosis    Sleep apnea     Surgical History: Past Surgical History:  Procedure Laterality Date   BIOPSY  03/27/2019   Procedure: BIOPSY;  Surgeon: Jerry Jerry CROME, MD;  [facility];  Service: Endoscopy;;   BIOPSY  08/16/2020   Procedure: BIOPSY;  Surgeon: Jerry Carlin POUR, DO;  [facility];  Service: Endoscopy;;   COLONOSCOPY  JUN 2011   PAN-COLONIC DIVERTICULOSIS   COL[facility]COPY  2007   [facility]   COLONOSCOPY WITH PROPOFOL  N/A 08/16/2020   Procedure: COLONOSCOPY WITH PROPOFOL ;  Surgeon: Jerry Carlin POUR, DO;  [facility];  Service: Endoscopy;  Laterality: N/A;  10:45am   ESOPHAGOGASTRODUODENOSCOPY  06/11/2011   Procedure: ESOPHAGOGASTRODUODENOSCOPY (EGD);  Surgeon: Jerry CHRISTELLA Harvey, MD;  [facility];  Service: Endoscopy;  Laterality: N/A;  11:00   ESOPHAGOGASTRODUODENOSCOPY  N/A 05/21/2014   Jerry Macdonald:Barrett's esophagus/ Medium sized hiatal hernia. path with indefinite dysplasia.    ESOPHAGOGASTRODUODENOSCOPY N/A 12/20/2014   Dr. Sonia gastric polyp/large HH/6 cm segment of Barrett's esophagus, path with hyperplastic gastric polyp, no dysplasia. Surveillance April 2017 due    ESOPHAGOGASTRODUODENOSCOPY N/A 01/20/2016   Procedure: ESOPHAGOGASTRODUODENOSCOPY (EGD);  Surgeon: Jerry Macdonald Harvey, MD;  [facility];  Service: Endoscopy;  Laterality: N/A;  1230pm   ESOPHAGOGASTRODUODENOSCOPY N/A 03/27/2019   Procedure: ESOPHAGOGASTRODUODENOSCOPY (EGD);  Surgeon: Jerry Jerry CROME, MD;  [facility];  Service: Endoscopy;  Laterality: N/A;  12:15pm   HERNIA REPAIR     LAPAROSCOPIC PARTIAL COLECTOMY Right 09/28/2020   Procedure: LAPAROSCOPIC RIGHT HEMI COLECTOMY;  Surgeon: Jerry Manuelita JAYSON, MD;  Location: [facility];  Service: General;  Laterality: Right;   THYROIDECTOMY N/A 01/23/2021   Procedure: TOTAL THYROIDECTOMY;  Surgeon: Jerry Manuelita JAYSON, MD;  Location: [facility];  Service: General;  Laterality: N/A;   UMBILICAL HERNIA REPAIR     VARICOSE VEIN SURGERY     VENTRAL HERNIA REPAIR N/A 09/20/2022   Procedure: HERNIA REPAIR VENTRAL ADULT W/ MESH;  Surgeon: Jerry Manuelita JAYSON, MD;  Location: [facility];  Service: General;  Laterality: N/A;    Home Medications:  Allergies as of 05/29/2024       Reactions   Penicillins  Swelling of hands and feet - severe Did it involve swelling of the face/tongue/throat, SOB, or low BP? No Did it involve sudden or severe rash/hives, skin peeling, or any reaction on the inside of your mouth or nose? No Did you need to seek medical attention at a hospital or doctor's office? No When did it last happen?      1950s If all above answers are "NO", may proceed with cephalosporin use.   Penicillin G Other (See Comments)        Medication List        Accurate as of May 29, 2024 12:43 PM. If you have any questions,  ask your nurse or doctor.          acetaminophen  500 MG tablet Commonly known as: TYLENOL  Take 500 mg by mouth every 8 (eight) hours as needed for moderate pain.   calcium -vitamin D  500-5 MG-MCG tablet Commonly known as: OSCAL WITH D Take 1 tablet by mouth in the morning and at bedtime. Lunch and dinner   ferrous sulfate 324 MG Tbec Take 324 mg by mouth every other day.   finasteride  5 MG tablet Commonly known as: PROSCAR  Take 1 tablet (5 mg total) by mouth daily.   levothyroxine  100 MCG tablet Commonly known as: SYNTHROID  Take 1 tablet (100 mcg total) by mouth daily before breakfast.   magnesium  oxide 400 MG tablet Commonly known as: MAG-OX Take 400 mg by mouth 2 (two) times daily.   multivitamins ther. w/minerals Tabs tablet Take 1 tablet by mouth daily.   ondansetron  4 MG disintegrating tablet Commonly known as: ZOFRAN -ODT Take 1 tablet (4 mg total) by mouth every 6 (six) hours as needed for nausea.   pantoprazole  40 MG tablet Commonly known as: PROTONIX  TAKE 1 TABLET ONCE A DAY BEFORE A MEAL, MAY TAKE SECOND TABLET BEFORE EVENING MEAL IF NEEDED   tamsulosin  0.4 MG Caps capsule Commonly known as: FLOMAX  Take 1 capsule (0.4 mg total) by mouth daily.   traMADol  50 MG tablet Commonly known as: ULTRAM  Take 1 tablet (50 mg total) by mouth every 6 (six) hours as needed for moderate pain or severe pain.   Vitamin D3 25 MCG (1000 UT) Caps Take 1 capsule by mouth 3 (three) times daily.        Allergies:  Allergies  Allergen Reactions   Penicillins     Swelling of hands and feet - severe Did it involve swelling of the face/tongue/throat, SOB, or low BP? No Did it involve sudden or severe rash/hives, skin peeling, or any reaction on the inside of your mouth or nose? No Did you need to seek medical attention at a hospital or doctor's office? No When did it last happen?      1950s If all above answers are "NO", may proceed with cephalosporin use.    Penicillin  G Other (See Comments)    Family History: Family History  Problem Relation Age of Onset   Prostate cancer Father 56       metastatic   Lung cancer Brother 69       heavy smoker   Prostate cancer Paternal Uncle        metastatic   Alzheimer's disease Maternal Grandmother    Prostate cancer Maternal Grandfather 37       metastatic   Prostate cancer Paternal Grandfather 41       metastatic   Ovarian cancer Paternal Aunt 34   Prostate cancer Paternal Uncle  metastatic   Cancer Cousin 35       unknown type, paternal first cousin   Liver cancer Cousin        dx 30s, paternal first cousin   Colon polyps Neg Hx    Colon cancer Neg Hx     Social History:  reports that he quit smoking about 60 years ago. His smoking use included cigarettes. He started smoking about 62 years ago. He has a 1 pack-year smoking history. He has never used smokeless tobacco. He reports that he does not currently use alcohol. He reports that he does not use drugs.  ROS: All other review of systems were reviewed and are negative except what is noted above in HPI  Physical Exam: BP 111/73   Pulse (!) 116   Constitutional:  Alert and oriented, No acute distress. HEENT: [place] AT, moist mucus membranes.  Trachea midline, no masses. Cardiovascular: No clubbing, cyanosis, or edema. Respiratory: Normal respiratory effort, no increased work of breathing. GI: Abdomen is soft, nontender, nondistended, no abdominal masses GU: No CVA tenderness.  Lymph: No cervical or inguinal lymphadenopathy. Skin: No rashes, bruises or suspicious lesions. Neurologic: Grossly intact, no focal deficits, moving all 4 extremities. Psychiatric: Normal mood and affect.  Laboratory Data: Lab Results  Component Value Date   WBC 6.7 04/06/2024   HGB 14.1 04/06/2024   HCT 41.6 04/06/2024   MCV 100.0 04/06/2024   PLT 181 04/06/2024    Lab Results  Component Value Date   CREATININE 0.76 04/06/2024    Lab Results  Component  Value Date   PSA 3.5 03/04/2020   PSA Dr. Verdene of Urology follows 08/12/2008   PSA 3.98 - Dr. Verdene 04/11/2007    No results found for: TESTOSTERONE   No results found for: HGBA1C  Urinalysis    Component Value Date/Time   COLORURINE YELLOW 10/18/2017 1220   APPEARANCEUR Clear 11/21/2023 1352   LABSPEC 1.015 10/18/2017 1220   PHURINE 6.0 10/18/2017 1220   GLUCOSEU Negative 11/21/2023 1352   HGBUR NEGATIVE 10/18/2017 1220   BILIRUBINUR Negative 11/21/2023 1352   KETONESUR NEGATIVE 10/18/2017 1220   PROTEINUR Negative 11/21/2023 1352   PROTEINUR NEGATIVE 10/18/2017 1220   UROBILINOGEN 0.2 02/03/2010 1231   NITRITE Negative 11/21/2023 1352   NITRITE NEGATIVE 10/18/2017 1220   LEUKOCYTESUR Negative 11/21/2023 1352    Lab Results  Component Value Date   LABMICR Comment 11/21/2023   WBCUA 6-10 (A) 11/01/2022   LABEPIT 0-10 11/01/2022   BACTERIA Moderate (A) 11/01/2022    Pertinent Imaging:  No results found for this or any previous visit.  No results found for this or any previous visit.  No results found for this or any previous visit.  No results found for this or any previous visit.  No results found for this or any previous visit.  No results found for this or any previous visit.  No results found for this or any previous visit.  No results found for this or any previous visit.   Assessment & Plan:    1. Prostate cancer (HCC) (Primary) Followup 6 months with PSA  2. BPH with urinary obstruction Continue flomax  0.4mg  daily and finasteride  5mg  daily  3. Nocturia -continue flomax  0.4mg  daily   No follow-ups on file.  Belvie Clara, MD  [facility] [place]

## 2024-06-02 ENCOUNTER — Encounter: Payer: Self-pay | Admitting: Urology

## 2024-06-02 NOTE — Patient Instructions (Signed)

## 2024-06-11 ENCOUNTER — Other Ambulatory Visit: Payer: Self-pay | Admitting: "Endocrinology

## 2024-06-11 DIAGNOSIS — E890 Postprocedural hypothyroidism: Secondary | ICD-10-CM

## 2024-07-13 DIAGNOSIS — E890 Postprocedural hypothyroidism: Secondary | ICD-10-CM | POA: Diagnosis not present

## 2024-07-14 LAB — COMPREHENSIVE METABOLIC PANEL WITH GFR
ALT: 18 IU/L (ref 0–44)
AST: 31 IU/L (ref 0–40)
Albumin: 4.3 g/dL (ref 3.6–4.6)
Alkaline Phosphatase: 66 IU/L (ref 48–129)
BUN/Creatinine Ratio: 21 (ref 10–24)
BUN: 19 mg/dL (ref 10–36)
Bilirubin Total: 1.4 mg/dL — ABNORMAL HIGH (ref 0.0–1.2)
CO2: 25 mmol/L (ref 20–29)
Calcium: 9.5 mg/dL (ref 8.6–10.2)
Chloride: 103 mmol/L (ref 96–106)
Creatinine, Ser: 0.9 mg/dL (ref 0.76–1.27)
Globulin, Total: 2.2 g/dL (ref 1.5–4.5)
Glucose: 107 mg/dL — ABNORMAL HIGH (ref 70–99)
Potassium: 4.7 mmol/L (ref 3.5–5.2)
Sodium: 140 mmol/L (ref 134–144)
Total Protein: 6.5 g/dL (ref 6.0–8.5)
eGFR: 81 mL/min/1.73 (ref >59)

## 2024-07-14 LAB — TSH: TSH: 1.71 u[IU]/mL (ref 0.450–4.500)

## 2024-07-14 LAB — T4, FREE: Free T4: 1.26 ng/dL (ref 0.82–1.77)

## 2024-07-16 DIAGNOSIS — H353132 Nonexudative age-related macular degeneration, bilateral, intermediate dry stage: Secondary | ICD-10-CM | POA: Diagnosis not present

## 2024-07-16 DIAGNOSIS — H43821 Vitreomacular adhesion, right eye: Secondary | ICD-10-CM | POA: Diagnosis not present

## 2024-07-16 DIAGNOSIS — H43822 Vitreomacular adhesion, left eye: Secondary | ICD-10-CM | POA: Diagnosis not present

## 2024-07-16 DIAGNOSIS — H353211 Exudative age-related macular degeneration, right eye, with active choroidal neovascularization: Secondary | ICD-10-CM | POA: Diagnosis not present

## 2024-07-20 ENCOUNTER — Encounter: Payer: Self-pay | Admitting: "Endocrinology

## 2024-07-20 ENCOUNTER — Ambulatory Visit (INDEPENDENT_AMBULATORY_CARE_PROVIDER_SITE_OTHER): Admitting: "Endocrinology

## 2024-07-20 VITALS — BP 110/54 | HR 64 | Ht 70.5 in | Wt 198.6 lb

## 2024-07-20 DIAGNOSIS — E890 Postprocedural hypothyroidism: Secondary | ICD-10-CM

## 2024-07-20 NOTE — Progress Notes (Signed)
 07/20/2024, 12:50 PM         Endocrinology follow-up note   Subjective:    Patient ID: Jerry Macdonald, male    DOB: 07/03/1931, PCP Jerry Norleen PEDLAR, Jerry Macdonald   Past Medical History:  Diagnosis Date   Abnormal prostate biopsy    Back pain    with radiculopathy   Cataracts, bilateral    Constipation    Family history of lung cancer    Family history of ovarian cancer    Family history of prostate cancer    Goiter    HOH (hard of hearing)    Hyperlipidemia    Hypertrophy of prostate    Malaise and fatigue    Obesity    Pyogenic granuloma    left cheeck   S/P colonoscopy June 2011   rectal bleeding secondary to hemorrhoids   S/P colonoscopy 2007   [facility], diverticulosis per Dr. milford note   S/P endoscopy June 2011   Barrett's, mild gastritis and duodenitis   Seborrheic keratosis    Sleep apnea    Past Surgical History:  Procedure Laterality Date   BIOPSY  03/27/2019   Procedure: BIOPSY;  Surgeon: Harvey Margo CROME, Jerry Macdonald;  [facility];  Service: Endoscopy;;   BIOPSY  08/16/2020   Procedure: BIOPSY;  Surgeon: Cindie Carlin POUR, DO;  [facility];  Service: Endoscopy;;   COLONOSCOPY  JUN 2011   PAN-COLONIC DIVERTICULOSIS   COL[place]OSCOPY  2007   [place] [facility]   COLONOSCOPY WITH PROPOFOL  N/A 08/16/2020   Procedure: COLONOSCOPY WITH PROPOFOL ;  Surgeon: Cindie Carlin POUR, DO;  [facility];  Service: Endoscopy;  Laterality: N/A;  10:45am   ESOPHAGOGASTRODUODENOSCOPY  06/11/2011   Procedure: ESOPHAGOGASTRODUODENOSCOPY (EGD);  Surgeon: Margo CHRISTELLA Harvey, Jerry Macdonald;  [facility];  Service: Endoscopy;  Laterality: N/A;  11:00   ESOPHAGOGASTRODUODENOSCOPY N/A 05/21/2014   Dr. Fields:Barrett's esophagus/ Medium sized hiatal hernia. path with indefinite dysplasia.    ESOPHAGOGASTRODUODENOSCOPY N/A 12/20/2014   Dr. Sonia gastric polyp/large HH/6 cm segment of Barrett's esophagus, path with  hyperplastic gastric polyp, no dysplasia. Surveillance April 2017 due    ESOPHAGOGASTRODUODENOSCOPY N/A 01/20/2016   Procedure: ESOPHAGOGASTRODUODENOSCOPY (EGD);  Surgeon: Margo CROME Harvey, Jerry Macdonald;  [facility];  Service: Endoscopy;  Laterality: N/A;  1230pm   ESOPHAGOGASTRODUODENOSCOPY N/A 03/27/2019   Procedure: ESOPHAGOGASTRODUODENOSCOPY (EGD);  Surgeon: Harvey Margo CROME, Jerry Macdonald;  [facility];  Service: Endoscopy;  Laterality: N/A;  12:15pm   HERNIA REPAIR     LAPAROSCOPIC PARTIAL COLECTOMY Right 09/28/2020   Procedure: LAPAROSCOPIC RIGHT HEMI COLECTOMY;  Surgeon: Kallie Manuelita JAYSON, Jerry Macdonald;  Location: [facility];  Service: General;  Laterality: Right;   THYROIDECTOMY N/A 01/23/2021   Procedure: TOTAL THYROIDECTOMY;  Surgeon: Kallie Manuelita JAYSON, Jerry Macdonald;  Location: [facility];  Service: General;  Laterality: N/A;   UMBILICAL HERNIA REPAIR     VARICOSE VEIN SURGERY     VENTRAL HERNIA REPAIR N/A 09/20/2022   Procedure: HERNIA REPAIR VENTRAL ADULT W/ MESH;  Surgeon: Kallie Manuelita JAYSON, Jerry Macdonald;  Location: [facility];  Service: General;  Laterality: N/A;   Social History   Socioeconomic History   Marital status: Divorced    Spouse name: Not on  file   Number of children: Not on file   Years of education: Not on file   Highest education level: Not on file  Occupational History   Not on file  Tobacco Use   Smoking status: Former    Current packs/day: 0.00    Average packs/day: 0.5 packs/day for 2.0 years (1.0 ttl pk-yrs)    Types: Cigarettes    Start date: 09/09/1961    Quit date: 09/10/1963    Years since quitting: 60.9   Smokeless tobacco: Never   Tobacco comments:    quit in 1965  Vaping Use   Vaping status: Former  Substance and Sexual Activity   Alcohol use: Not Currently   Drug use: No   Sexual activity: Not Currently  Other Topics Concern   Not on file  Social History Narrative   Not on file   Social Drivers of Health   Financial Resource Strain: Not on file  Food Insecurity: Not on  file  Transportation Needs: No Transportation Needs (09/06/2020)   PRAPARE - Administrator, Civil Service (Medical): No    Lack of Transportation (Non-Medical): No  Physical Activity: Inactive (09/06/2020)   Exercise Vital Sign    Days of Exercise per Week: 0 days    Minutes of Exercise per Session: 0 min  Stress: Not on file  Social Connections: Not on file   Family History  Problem Relation Age of Onset   Prostate cancer Father 40       metastatic   Lung cancer Brother 35       heavy smoker   Prostate cancer Paternal Uncle        metastatic   Alzheimer's disease Maternal Grandmother    Prostate cancer Maternal Grandfather 82       metastatic   Prostate cancer Paternal Grandfather 20       metastatic   Ovarian cancer Paternal Aunt 11   Prostate cancer Paternal Uncle        metastatic   Cancer Cousin 80       unknown type, paternal first cousin   Liver cancer Cousin        dx 30s, paternal first cousin   Colon polyps Neg Hx    Colon cancer Neg Hx    Outpatient Encounter Medications as of 07/20/2024  Medication Sig   acetaminophen  (TYLENOL ) 500 MG tablet Take 500 mg by mouth every 8 (eight) hours as needed for moderate pain.   calcium -vitamin D  (OSCAL WITH D) 500-5 MG-MCG tablet Take 1 tablet by mouth in the morning and at bedtime. Lunch and dinner   Cholecalciferol  (VITAMIN D3) 25 MCG (1000 UT) CAPS Take 1 capsule by mouth 3 (three) times daily.   ferrous sulfate 324 MG TBEC Take 324 mg by mouth every other day.   finasteride  (PROSCAR ) 5 MG tablet Take 1 tablet (5 mg total) by mouth daily.   levothyroxine  (SYNTHROID ) 100 MCG tablet TAKE 1 TABLET DAILY BEFORE BREAKFAST   magnesium  oxide (MAG-OX) 400 MG tablet Take 400 mg by mouth 2 (two) times daily.   Multiple Vitamins-Minerals (MULTIVITAMINS THER. W/MINERALS) TABS Take 1 tablet by mouth daily.   ondansetron  (ZOFRAN -ODT) 4 MG disintegrating tablet Take 1 tablet (4 mg total) by mouth every 6 (six) hours as needed  for nausea.   pantoprazole  (PROTONIX ) 40 MG tablet TAKE 1 TABLET ONCE A DAY BEFORE A MEAL, MAY TAKE SECOND TABLET BEFORE EVENING MEAL IF NEEDED   tamsulosin  (FLOMAX ) 0.4 MG CAPS capsule Take  1 capsule (0.4 mg total) by mouth daily.   traMADol  (ULTRAM ) 50 MG tablet Take 1 tablet (50 mg total) by mouth every 6 (six) hours as needed for moderate pain or severe pain.   No facility-administered encounter medications on file as of 07/20/2024.   ALLERGIES: Allergies  Allergen Reactions   Penicillins     Swelling of hands and feet - severe Did it involve swelling of the face/tongue/throat, SOB, or low BP? No Did it involve sudden or severe rash/hives, skin peeling, or any reaction on the inside of your mouth or nose? No Did you need to seek medical attention at a hospital or doctor's office? No When did it last happen?      1950s If all above answers are "NO", may proceed with cephalosporin use.    Penicillin G Other (See Comments)    VACCINATION STATUS: Immunization History  Administered Date(s) Administered   DTaP 06/03/2013   H1N1 08/12/2008   Influenza Whole 07/07/2007, 05/20/2008   Influenza-Unspecified 06/03/2013, 06/03/2018   Pneumococcal-Unspecified 06/03/2013, 06/03/2014   Varicella 06/03/2013    HPI Jerry Macdonald is 89 y.o. male who is seen in follow-up for history of compressive multinodular goiter.    He underwent total thyroidectomy on Jan 23, 2021 by Dr. Morna Pander.   His surgical sample is negative for malignancy.    - He reports feeling better, previous compressive symptoms have resolved.  He is currently on levothyroxine  100 mcg p.o. daily before breakfast.  He continues to tolerate this medication.  He reports reasonable consistency and compliance.  His previsit thyroid  function test are consistent with appropriate replacement.    For his postsurgical hypocalcemia, he remains on low-dose calcium /vitamin D  supplements.    He is not an optimal historian.   History is obtained mainly from chart review. he has been dealing with symptoms of fatigue, positional shortness of breath, sleep apnea for several years. He reports that he breathes better, swallows without difficulty. Jerry Macdonald  He is a former smoker, denies any exposure to neck radiations. His other medical problems include BPH/prostate cancer, Barrett's esophagus, sleep apnea, and recently diagnosed cecal/colon cancer. He is cared for by an elderly friend Jerry Macdonald is also his power of attorney.   Review of Systems  Limited as above.  Objective:       07/20/2024   10:50 AM 05/29/2024   12:35 PM 04/24/2024   11:00 AM  Vitals with BMI  Height 5' 10.5    Weight 198 lbs 10 oz  195 lbs 8 oz  BMI 28.08    Systolic 110 111 896  Diastolic 54 73 78  Pulse 64 116 94    BP (!) 110/54   Pulse 64   Ht 5' 10.5 (1.791 m)   Wt 198 lb 9.6 oz (90.1 kg)   BMI 28.09 kg/m   Wt Readings from Last 3 Encounters:  07/20/24 198 lb 9.6 oz (90.1 kg)  04/24/24 195 lb 8 oz (88.7 kg)  02/20/24 194 lb (88 kg)    Physical Exam    CMP ( most recent) CMP     Component Value Date/Time   NA 140 07/13/2024 1107   K 4.7 07/13/2024 1107   CL 103 07/13/2024 1107   CO2 25 07/13/2024 1107   GLUCOSE 107 (H) 07/13/2024 1107   GLUCOSE 98 04/06/2024 0929   BUN 19 07/13/2024 1107   CREATININE 0.90 07/13/2024 1107   CALCIUM  9.5 07/13/2024 1107   CALCIUM  8.6 01/23/2021 1552   PROT  6.5 07/13/2024 1107   ALBUMIN 4.3 07/13/2024 1107   AST 31 07/13/2024 1107   ALT 18 07/13/2024 1107   ALKPHOS 66 07/13/2024 1107   BILITOT 1.4 (H) 07/13/2024 1107   GFRNONAA >60 04/06/2024 0929   GFRAA  02/03/2010 1249    >60        The eGFR has been calculated using the MDRD equation. This calculation has not been validated in all clinical situations. eGFR's persistently <60 mL/min signify possible Chronic Kidney Disease.     Lipid Panel ( most recent) Lipid Panel     Component Value Date/Time   CHOL 139  08/12/2008 2244   TRIG 88 08/12/2008 2244   HDL 39 (L) 08/12/2008 2244   CHOLHDL 3.6 Ratio 08/12/2008 2244   VLDL 18 08/12/2008 2244   LDLCALC 82 08/12/2008 2244      Lab Results  Component Value Date   TSH 1.710 07/13/2024   TSH 0.787 12/02/2023   TSH 0.582 06/10/2023   TSH 0.653 06/04/2022   TSH 0.778 12/04/2021   TSH 7.190 (H) 07/28/2021   TSH 0.727 02/02/2021   TSH 0.103 (L) 01/19/2021   TSH 0.198 (L) 10/21/2020   TSH 0.075 (L) 09/06/2020   FREET4 1.26 07/13/2024   FREET4 1.23 12/02/2023   FREET4 1.33 06/10/2023   FREET4 1.31 06/04/2022   FREET4 1.35 12/04/2021   FREET4 1.12 07/28/2021   FREET4 1.02 02/02/2021   FREET4 1.06 10/21/2020    September 14, 2020 thyroid  ultrasound: Right lobe measures 13.8 x 6.7 x 6.4 cm with large 5.3 cm nodule which was biopsied and findings were benign. Left lobe measured 9.9 x 4.9 x 3.5 cm with 2 nodules measuring 4.8 cm Pseudonodule not biopsied and 1.5 cm-suspicious biopsied.  Fine-needle aspiration biopsy results of only thyroid  nodule is available today showing benign follicular nodule.  Total thyroidectomy Jan 23, 2021. Surgical sample was significant for nodular hyperplasia bilaterally.  Assessment & Plan:   1.  Postsurgical hypothyroidism 2. Multinodular goiter-resolved 3.  Hypocalcemia-needs ongoing supplement  His compressive symptoms have resolved.  His previsit thyroid  function tests are consistent with appropriate replacement.  He is advised to continue levothyroxine  100 mcg p.o. daily before breakfast.    - We discussed about the correct intake of his thyroid  hormone, on empty stomach at fasting, with water , separated by at least 30 minutes from breakfast and other medications,  and separated by more than 4 hours from calcium , iron, multivitamins, acid reflux medications (PPIs). -Patient is made aware of the fact that thyroid  hormone replacement is needed for life, dose to be adjusted by periodic monitoring of thyroid   function tests.   For his postsurgical mild hypocalcemia, he is advised to continue calcium /vitamin D   D 500- 200 1 tablet 3 times daily with lunch and supper.     - he is advised to maintain close follow up with Jerry Norleen PEDLAR, Jerry Macdonald for primary care needs.    I spent  20  minutes in the care of the patient today including review of labs from Thyroid  Function, CMP, and other relevant labs ; imaging/biopsy records (current and previous including abstractions from other facilities); face-to-face time discussing  his lab results and symptoms, medications doses, his options of short and long term treatment based on the latest standards of care / guidelines;   and documenting the encounter.  Johann C Arnell  participated in the discussions, expressed understanding, and voiced agreement with the above plans.  All questions were answered to his satisfaction. he  is encouraged to contact clinic should he have any questions or concerns prior to his return visit.   Follow up plan: Return in about 1 year (around 07/20/2025) for F/U with Pre-visit Labs.   Ranny Earl, Jerry Macdonald [facility] [address] [place], [place] [address] Phone: [phone]  Fax: [phone]     07/20/2024, 12:50 PM  This note was partially dictated with voice recognition software. Similar sounding words can be transcribed inadequately or may not  be corrected upon review.

## 2024-08-11 ENCOUNTER — Ambulatory Visit: Admitting: General Surgery

## 2024-08-11 ENCOUNTER — Encounter: Payer: Self-pay | Admitting: General Surgery

## 2024-08-11 VITALS — BP 116/61 | HR 96 | Temp 97.5°F | Resp 16 | Ht 70.5 in | Wt 194.0 lb

## 2024-08-11 DIAGNOSIS — K432 Incisional hernia without obstruction or gangrene: Secondary | ICD-10-CM

## 2024-08-11 NOTE — Patient Instructions (Signed)
 Monitor hernia Activity as tolerated

## 2024-08-11 NOTE — Progress Notes (Unsigned)
 [facility]  Doing well. No changes. Hernia is stable. No issues.  BP 116/61   Pulse 96   Temp (!) 97.5 F (36.4 C) (Oral)   Resp 16   Ht 5' 10.5 (1.791 m)   Wt 194 lb (88 kg)   SpO2 96%   BMI 27.44 kg/m  Soft, 1cm hernia, reducible  Jerry Macdonald is the sweetest 89 yo who has a epigastric hernia that is small and non tender. CT from 2025 without obvious hernia, he is scheduled for another one in the new year to follow up on his cancer.   The hernia is reducible and feels like fatty tissue. I will try to review his CT in January to ensure no major issue or bowel in the hernia.  He can follow up as needed.  Future Appointments  Date Time Provider Department Center  08/11/2024 11:15 AM Kallie Manuelita BROCKS, MD RS-RS None  10/19/2024  9:00 AM [facility] LAB [facility] None  10/19/2024 10:00 AM [facility] 1 [facility]  10/26/2024  3:00 PM Davonna Siad, MD [facility] None  11/30/2024  1:00 PM AUR-LAB [facility] None  12/14/2024  1:10 PM McKenzie, Belvie CROME, MD [facility]UR None  07/19/2025 10:30 AM Lenis Ethelle ORN, MD REA-REA None    Manuelita Kallie, MD [facility] [address] [place], [place] [address] [phone] (office)

## 2024-08-13 ENCOUNTER — Ambulatory Visit: Admitting: General Surgery

## 2024-10-09 ENCOUNTER — Emergency Department (HOSPITAL_COMMUNITY): Admission: EM | Admit: 2024-10-09 | Source: Home / Self Care

## 2024-10-09 ENCOUNTER — Other Ambulatory Visit: Payer: Self-pay

## 2024-10-09 ENCOUNTER — Encounter (HOSPITAL_COMMUNITY): Payer: Self-pay

## 2024-10-09 LAB — COMPREHENSIVE METABOLIC PANEL WITH GFR
ALT: 14 U/L (ref 0–44)
AST: 24 U/L (ref 15–41)
Albumin: 3.9 g/dL (ref 3.5–5.0)
Alkaline Phosphatase: 68 U/L (ref 38–126)
Anion gap: 11 (ref 5–15)
BUN: 27 mg/dL — ABNORMAL HIGH (ref 8–23)
CO2: 24 mmol/L (ref 22–32)
Calcium: 9.1 mg/dL (ref 8.9–10.3)
Chloride: 103 mmol/L (ref 98–111)
Creatinine, Ser: 0.87 mg/dL (ref 0.61–1.24)
GFR, Estimated: >60 mL/min
Glucose, Bld: 122 mg/dL — ABNORMAL HIGH (ref 70–99)
Potassium: 4.8 mmol/L (ref 3.5–5.1)
Sodium: 139 mmol/L (ref 135–145)
Total Bilirubin: 1 mg/dL (ref 0.0–1.2)
Total Protein: 6.5 g/dL (ref 6.5–8.1)

## 2024-10-09 LAB — TYPE AND SCREEN
ABO/RH(D): O POS
Antibody Screen: NEGATIVE

## 2024-10-09 LAB — CBC
HCT: 37.9 % — ABNORMAL LOW (ref 39.0–52.0)
Hemoglobin: 12.6 g/dL — ABNORMAL LOW (ref 13.0–17.0)
MCH: 33.4 pg (ref 26.0–34.0)
MCHC: 33.2 g/dL (ref 30.0–36.0)
MCV: 100.5 fL — ABNORMAL HIGH (ref 80.0–100.0)
Platelets: 177 10*3/uL (ref 150–400)
RBC: 3.77 MIL/uL — ABNORMAL LOW (ref 4.22–5.81)
RDW: 12.9 % (ref 11.5–15.5)
WBC: 11.7 10*3/uL — ABNORMAL HIGH (ref 4.0–10.5)
nRBC: 0 % (ref 0.0–0.2)

## 2024-10-09 NOTE — ED Triage Notes (Signed)
 Pov from home cc of rectal bleeding since yesterday. Says that it is worse today. Thinks he has bad hemorrhoids. Family says that he's had colon cancer Has been having dizzy spells today too. Fell off toilet today and scraped arm but no head injury no LOC.

## 2024-10-19 ENCOUNTER — Ambulatory Visit (HOSPITAL_COMMUNITY)

## 2024-10-19 ENCOUNTER — Inpatient Hospital Stay

## 2024-10-26 ENCOUNTER — Inpatient Hospital Stay: Admitting: Oncology

## 2024-11-30 ENCOUNTER — Other Ambulatory Visit

## 2024-12-14 ENCOUNTER — Ambulatory Visit: Admitting: Urology

## 2025-07-19 ENCOUNTER — Ambulatory Visit: Admitting: "Endocrinology
